# Patient Record
Sex: Male | Born: 1955
Health system: Southern US, Community
[De-identification: ages and names within clinical notes are randomized; demographics above are authoritative.]

## PROBLEM LIST (undated history)

## (undated) DIAGNOSIS — R7301 Impaired fasting glucose: Secondary | ICD-10-CM

## (undated) DIAGNOSIS — Z860100 Personal history of colon polyps, unspecified: Secondary | ICD-10-CM

## (undated) DIAGNOSIS — I10 Essential (primary) hypertension: Secondary | ICD-10-CM

## (undated) DIAGNOSIS — Z951 Presence of aortocoronary bypass graft: Secondary | ICD-10-CM

## (undated) DIAGNOSIS — I2510 Atherosclerotic heart disease of native coronary artery without angina pectoris: Secondary | ICD-10-CM

## (undated) DIAGNOSIS — S62109A Fracture of unspecified carpal bone, unspecified wrist, initial encounter for closed fracture: Secondary | ICD-10-CM

## (undated) DIAGNOSIS — K219 Gastro-esophageal reflux disease without esophagitis: Secondary | ICD-10-CM

## (undated) DIAGNOSIS — N529 Male erectile dysfunction, unspecified: Secondary | ICD-10-CM

## (undated) DIAGNOSIS — G479 Sleep disorder, unspecified: Secondary | ICD-10-CM

## (undated) DIAGNOSIS — I2102 ST elevation (STEMI) myocardial infarction involving left anterior descending coronary artery: Secondary | ICD-10-CM

## (undated) DIAGNOSIS — R7303 Prediabetes: Secondary | ICD-10-CM

## (undated) DIAGNOSIS — E785 Hyperlipidemia, unspecified: Secondary | ICD-10-CM

## (undated) DIAGNOSIS — M1990 Unspecified osteoarthritis, unspecified site: Secondary | ICD-10-CM

## (undated) HISTORY — DX: Sleep disorder, unspecified: G47.9

## (undated) HISTORY — PX: RHINOPLASTY: SUR1284

## (undated) HISTORY — DX: Impaired fasting glucose: R73.01

## (undated) HISTORY — DX: Personal history of colonic polyps: Z86.010

## (undated) HISTORY — DX: Essential (primary) hypertension: I10

## (undated) HISTORY — PX: HEMORRHOID SURGERY: SHX153

## (undated) HISTORY — PX: BRANCHIAL CLEFT CYST EXCISION: SUR497

## (undated) HISTORY — DX: Atherosclerotic heart disease of native coronary artery without angina pectoris: I25.10

## (undated) HISTORY — DX: Fracture of unspecified carpal bone, unspecified wrist, initial encounter for closed fracture: S62.109A

## (undated) HISTORY — DX: Gastro-esophageal reflux disease without esophagitis: K21.9

## (undated) HISTORY — DX: Personal history of colon polyps, unspecified: Z86.0100

## (undated) HISTORY — DX: Male erectile dysfunction, unspecified: N52.9

## (undated) HISTORY — PX: CARDIAC CATHETERIZATION: SHX172

## (undated) HISTORY — DX: Hyperlipidemia, unspecified: E78.5

---

## 1997-08-22 ENCOUNTER — Ambulatory Visit (HOSPITAL_BASED_OUTPATIENT_CLINIC_OR_DEPARTMENT_OTHER): Admission: RE | Admit: 1997-08-22 | Discharge: 1997-08-22 | Payer: Self-pay | Admitting: *Deleted

## 1998-08-29 ENCOUNTER — Encounter: Payer: Self-pay | Admitting: Internal Medicine

## 1999-05-07 ENCOUNTER — Emergency Department (HOSPITAL_COMMUNITY): Admission: EM | Admit: 1999-05-07 | Discharge: 1999-05-07 | Payer: Self-pay | Admitting: Emergency Medicine

## 1999-05-07 ENCOUNTER — Encounter: Payer: Self-pay | Admitting: Emergency Medicine

## 2005-10-27 DIAGNOSIS — I2510 Atherosclerotic heart disease of native coronary artery without angina pectoris: Secondary | ICD-10-CM

## 2005-10-27 HISTORY — DX: Atherosclerotic heart disease of native coronary artery without angina pectoris: I25.10

## 2005-10-29 ENCOUNTER — Encounter: Payer: Self-pay | Admitting: Family Medicine

## 2005-10-29 ENCOUNTER — Inpatient Hospital Stay (HOSPITAL_COMMUNITY): Admission: EM | Admit: 2005-10-29 | Discharge: 2005-10-31 | Payer: Self-pay | Admitting: Emergency Medicine

## 2005-11-20 ENCOUNTER — Encounter: Payer: Self-pay | Admitting: Internal Medicine

## 2006-11-25 ENCOUNTER — Ambulatory Visit: Payer: Self-pay | Admitting: Internal Medicine

## 2006-11-25 DIAGNOSIS — G479 Sleep disorder, unspecified: Secondary | ICD-10-CM | POA: Insufficient documentation

## 2006-11-25 DIAGNOSIS — K219 Gastro-esophageal reflux disease without esophagitis: Secondary | ICD-10-CM | POA: Insufficient documentation

## 2006-11-25 DIAGNOSIS — E785 Hyperlipidemia, unspecified: Secondary | ICD-10-CM | POA: Insufficient documentation

## 2006-11-25 DIAGNOSIS — N529 Male erectile dysfunction, unspecified: Secondary | ICD-10-CM | POA: Insufficient documentation

## 2006-12-17 ENCOUNTER — Telehealth (INDEPENDENT_AMBULATORY_CARE_PROVIDER_SITE_OTHER): Payer: Self-pay | Admitting: *Deleted

## 2007-01-06 ENCOUNTER — Encounter: Payer: Self-pay | Admitting: Internal Medicine

## 2007-02-04 ENCOUNTER — Encounter: Payer: Self-pay | Admitting: Internal Medicine

## 2007-03-05 ENCOUNTER — Ambulatory Visit: Payer: Self-pay | Admitting: Internal Medicine

## 2007-03-06 LAB — CONVERTED CEMR LAB
ALT: 30 units/L (ref 0–53)
Albumin: 3.9 g/dL (ref 3.5–5.2)
BUN: 10 mg/dL (ref 6–23)
Basophils Absolute: 0.1 10*3/uL (ref 0.0–0.1)
Basophils Relative: 1.3 % — ABNORMAL HIGH (ref 0.0–1.0)
CO2: 30 meq/L (ref 19–32)
Calcium: 9.5 mg/dL (ref 8.4–10.5)
Chloride: 105 meq/L (ref 96–112)
Cholesterol: 235 mg/dL (ref 0–200)
Creatinine, Ser: 1 mg/dL (ref 0.4–1.5)
Direct LDL: 168.3 mg/dL
Eosinophils Absolute: 0.6 10*3/uL (ref 0.0–0.6)
Eosinophils Relative: 6.9 % — ABNORMAL HIGH (ref 0.0–5.0)
GFR calc Af Amer: 101 mL/min
GFR calc non Af Amer: 84 mL/min
Glucose, Bld: 108 mg/dL — ABNORMAL HIGH (ref 70–99)
HCT: 43.2 % (ref 39.0–52.0)
HDL: 41.6 mg/dL (ref >39.0)
Hemoglobin: 15 g/dL (ref 13.0–17.0)
Lymphocytes Relative: 40.5 % (ref 12.0–46.0)
MCHC: 34.8 g/dL (ref 30.0–36.0)
MCV: 89.3 fL (ref 78.0–100.0)
Monocytes Absolute: 0.9 10*3/uL — ABNORMAL HIGH (ref 0.2–0.7)
Monocytes Relative: 10.8 % (ref 3.0–11.0)
Neutro Abs: 3.2 10*3/uL (ref 1.4–7.7)
Neutrophils Relative %: 40.5 % — ABNORMAL LOW (ref 43.0–77.0)
PSA: 0.6 ng/mL (ref 0.10–4.00)
Phosphorus: 2.8 mg/dL (ref 2.3–4.6)
Platelets: 260 10*3/uL (ref 150–400)
Potassium: 4.4 meq/L (ref 3.5–5.1)
RBC: 4.84 M/uL (ref 4.22–5.81)
RDW: 12.6 % (ref 11.5–14.6)
Sodium: 141 meq/L (ref 135–145)
TSH: 1.07 microintl units/mL (ref 0.35–5.50)
Total CHOL/HDL Ratio: 5.6
Triglycerides: 111 mg/dL (ref 0–149)
VLDL: 22 mg/dL (ref 0–40)
WBC: 8 10*3/uL (ref 4.5–10.5)

## 2007-04-01 ENCOUNTER — Ambulatory Visit: Payer: Self-pay | Admitting: Gastroenterology

## 2007-04-03 ENCOUNTER — Ambulatory Visit: Payer: Self-pay | Admitting: Gastroenterology

## 2007-04-03 ENCOUNTER — Encounter: Payer: Self-pay | Admitting: Internal Medicine

## 2007-04-03 ENCOUNTER — Encounter: Payer: Self-pay | Admitting: Gastroenterology

## 2007-04-03 DIAGNOSIS — D126 Benign neoplasm of colon, unspecified: Secondary | ICD-10-CM | POA: Insufficient documentation

## 2007-04-03 HISTORY — PX: COLONOSCOPY: SHX174

## 2007-04-03 LAB — HM COLONOSCOPY

## 2007-04-06 ENCOUNTER — Encounter: Payer: Self-pay | Admitting: Internal Medicine

## 2007-05-16 DIAGNOSIS — K5730 Diverticulosis of large intestine without perforation or abscess without bleeding: Secondary | ICD-10-CM | POA: Insufficient documentation

## 2007-05-16 DIAGNOSIS — I10 Essential (primary) hypertension: Secondary | ICD-10-CM | POA: Insufficient documentation

## 2007-07-02 ENCOUNTER — Emergency Department (HOSPITAL_COMMUNITY): Admission: EM | Admit: 2007-07-02 | Discharge: 2007-07-02 | Payer: Self-pay | Admitting: Family Medicine

## 2007-09-07 ENCOUNTER — Ambulatory Visit: Payer: Self-pay | Admitting: Internal Medicine

## 2008-02-29 ENCOUNTER — Ambulatory Visit: Payer: Self-pay | Admitting: Family Medicine

## 2008-03-15 ENCOUNTER — Ambulatory Visit: Payer: Self-pay | Admitting: Internal Medicine

## 2008-03-16 LAB — CONVERTED CEMR LAB
ALT: 31 units/L (ref 0–53)
AST: 26 units/L (ref 0–37)
Albumin: 3.8 g/dL (ref 3.5–5.2)
Alkaline Phosphatase: 93 units/L (ref 39–117)
BUN: 11 mg/dL (ref 6–23)
Basophils Absolute: 0.1 10*3/uL (ref 0.0–0.1)
Basophils Relative: 0.9 % (ref 0.0–3.0)
Bilirubin, Direct: 0.1 mg/dL (ref 0.0–0.3)
CO2: 30 meq/L (ref 19–32)
Calcium: 9.3 mg/dL (ref 8.4–10.5)
Chloride: 105 meq/L (ref 96–112)
Cholesterol: 221 mg/dL (ref 0–200)
Creatinine, Ser: 1 mg/dL (ref 0.4–1.5)
Direct LDL: 135.3 mg/dL
Eosinophils Absolute: 0.5 10*3/uL (ref 0.0–0.7)
Eosinophils Relative: 4.8 % (ref 0.0–5.0)
GFR calc Af Amer: 101 mL/min
GFR calc non Af Amer: 83 mL/min
Glucose, Bld: 115 mg/dL — ABNORMAL HIGH (ref 70–99)
HCT: 43.6 % (ref 39.0–52.0)
HDL: 47.8 mg/dL (ref >39.0)
Hemoglobin: 15.1 g/dL (ref 13.0–17.0)
Lymphocytes Relative: 22.9 % (ref 12.0–46.0)
MCHC: 34.7 g/dL (ref 30.0–36.0)
MCV: 89.6 fL (ref 78.0–100.0)
Monocytes Absolute: 0.9 10*3/uL (ref 0.1–1.0)
Monocytes Relative: 8 % (ref 3.0–12.0)
Neutro Abs: 6.7 10*3/uL (ref 1.4–7.7)
Neutrophils Relative %: 63.4 % (ref 43.0–77.0)
PSA: 0.67 ng/mL (ref 0.10–4.00)
Phosphorus: 2.7 mg/dL (ref 2.3–4.6)
Platelets: 272 10*3/uL (ref 150–400)
Potassium: 4.3 meq/L (ref 3.5–5.1)
RBC: 4.87 M/uL (ref 4.22–5.81)
RDW: 13 % (ref 11.5–14.6)
Sodium: 142 meq/L (ref 135–145)
TSH: 0.65 microintl units/mL (ref 0.35–5.50)
Total Bilirubin: 0.8 mg/dL (ref 0.3–1.2)
Total CHOL/HDL Ratio: 4.6
Total Protein: 7.2 g/dL (ref 6.0–8.3)
Triglycerides: 112 mg/dL (ref 0–149)
VLDL: 22 mg/dL (ref 0–40)
WBC: 10.7 10*3/uL — ABNORMAL HIGH (ref 4.5–10.5)

## 2008-03-29 ENCOUNTER — Telehealth: Payer: Self-pay | Admitting: Internal Medicine

## 2008-03-31 ENCOUNTER — Encounter: Payer: Self-pay | Admitting: Internal Medicine

## 2008-05-14 ENCOUNTER — Ambulatory Visit: Payer: Self-pay | Admitting: *Deleted

## 2008-05-14 LAB — CONVERTED CEMR LAB: Rapid Strep: NEGATIVE

## 2008-06-09 ENCOUNTER — Encounter (INDEPENDENT_AMBULATORY_CARE_PROVIDER_SITE_OTHER): Payer: Self-pay | Admitting: *Deleted

## 2008-06-11 ENCOUNTER — Ambulatory Visit: Payer: Self-pay | Admitting: Family Medicine

## 2008-08-18 ENCOUNTER — Ambulatory Visit: Payer: Self-pay | Admitting: Family Medicine

## 2008-08-22 ENCOUNTER — Ambulatory Visit: Payer: Self-pay | Admitting: Family Medicine

## 2008-08-22 ENCOUNTER — Telehealth: Payer: Self-pay | Admitting: Family Medicine

## 2008-08-23 ENCOUNTER — Encounter: Payer: Self-pay | Admitting: Family Medicine

## 2008-08-26 ENCOUNTER — Ambulatory Visit: Payer: Self-pay | Admitting: Family Medicine

## 2008-12-02 ENCOUNTER — Ambulatory Visit: Payer: Self-pay | Admitting: Internal Medicine

## 2008-12-02 DIAGNOSIS — R7303 Prediabetes: Secondary | ICD-10-CM | POA: Insufficient documentation

## 2008-12-02 LAB — CONVERTED CEMR LAB
ALT: 38 units/L (ref 0–53)
AST: 28 units/L (ref 0–37)
Albumin: 4 g/dL (ref 3.5–5.2)
Alkaline Phosphatase: 96 units/L (ref 39–117)
BUN: 17 mg/dL (ref 6–23)
Basophils Absolute: 0 10*3/uL (ref 0.0–0.1)
Basophils Relative: 0.6 % (ref 0.0–3.0)
Bilirubin, Direct: 0.2 mg/dL (ref 0.0–0.3)
CO2: 32 meq/L (ref 19–32)
Calcium: 9.3 mg/dL (ref 8.4–10.5)
Chloride: 106 meq/L (ref 96–112)
Cholesterol: 232 mg/dL — ABNORMAL HIGH (ref 0–200)
Creatinine, Ser: 1 mg/dL (ref 0.4–1.5)
Direct LDL: 164.4 mg/dL
Eosinophils Absolute: 0.7 10*3/uL (ref 0.0–0.7)
Eosinophils Relative: 9.7 % — ABNORMAL HIGH (ref 0.0–5.0)
Glucose, Bld: 118 mg/dL — ABNORMAL HIGH (ref 70–99)
HCT: 44.3 % (ref 39.0–52.0)
HDL: 44.4 mg/dL (ref >39.00)
Hemoglobin: 15.4 g/dL (ref 13.0–17.0)
Hgb A1c MFr Bld: 5.9 % (ref 4.6–6.5)
Lymphocytes Relative: 38.8 % (ref 12.0–46.0)
Lymphs Abs: 2.9 10*3/uL (ref 0.7–4.0)
MCHC: 34.8 g/dL (ref 30.0–36.0)
MCV: 89.7 fL (ref 78.0–100.0)
Monocytes Absolute: 0.8 10*3/uL (ref 0.1–1.0)
Monocytes Relative: 10.5 % (ref 3.0–12.0)
Neutro Abs: 3.1 10*3/uL (ref 1.4–7.7)
Neutrophils Relative %: 40.4 % — ABNORMAL LOW (ref 43.0–77.0)
Phosphorus: 2.9 mg/dL (ref 2.3–4.6)
Platelets: 235 10*3/uL (ref 150.0–400.0)
Potassium: 4.2 meq/L (ref 3.5–5.1)
RBC: 4.94 M/uL (ref 4.22–5.81)
RDW: 13.1 % (ref 11.5–14.6)
Sodium: 143 meq/L (ref 135–145)
TSH: 0.82 microintl units/mL (ref 0.35–5.50)
Total Bilirubin: 0.9 mg/dL (ref 0.3–1.2)
Total CHOL/HDL Ratio: 5
Total Protein: 7.3 g/dL (ref 6.0–8.3)
Triglycerides: 157 mg/dL — ABNORMAL HIGH (ref 0.0–149.0)
VLDL: 31.4 mg/dL (ref 0.0–40.0)
WBC: 7.5 10*3/uL (ref 4.5–10.5)

## 2009-02-04 ENCOUNTER — Encounter: Payer: Self-pay | Admitting: Family Medicine

## 2009-02-04 ENCOUNTER — Ambulatory Visit: Payer: Self-pay | Admitting: Family Medicine

## 2009-05-01 ENCOUNTER — Telehealth: Payer: Self-pay | Admitting: Family Medicine

## 2009-06-09 ENCOUNTER — Ambulatory Visit: Payer: Self-pay | Admitting: Internal Medicine

## 2009-12-15 ENCOUNTER — Ambulatory Visit: Payer: Self-pay | Admitting: Internal Medicine

## 2009-12-19 LAB — CONVERTED CEMR LAB
ALT: 22 units/L (ref 0–53)
AST: 19 units/L (ref 0–37)
Albumin: 4.1 g/dL (ref 3.5–5.2)
Alkaline Phosphatase: 90 units/L (ref 39–117)
Anti Nuclear Antibody(ANA): NEGATIVE
BUN: 12 mg/dL (ref 6–23)
CO2: 24 meq/L (ref 19–32)
Calcium: 9.4 mg/dL (ref 8.4–10.5)
Chloride: 104 meq/L (ref 96–112)
Creatinine, Ser: 1.07 mg/dL (ref 0.40–1.50)
Glucose, Bld: 127 mg/dL — ABNORMAL HIGH (ref 70–99)
HCT: 43.8 % (ref 39.0–52.0)
Hemoglobin: 14.3 g/dL (ref 13.0–17.0)
MCHC: 32.6 g/dL (ref 30.0–36.0)
MCV: 89.9 fL (ref 78.0–100.0)
Platelets: 234 10*3/uL (ref 150–400)
Potassium: 4.3 meq/L (ref 3.5–5.3)
RBC: 4.87 M/uL (ref 4.22–5.81)
RDW: 13.7 % (ref 11.5–15.5)
Rhuematoid fact SerPl-aCnc: <20 intl units/mL (ref 0–20)
Sed Rate: 5 mm/hr (ref 0–16)
Sex Hormone Binding: 15 nmol/L (ref 13–71)
Sodium: 139 meq/L (ref 135–145)
TSH: 1.066 microintl units/mL (ref 0.350–4.500)
Testosterone Free: 55.4 pg/mL (ref 47.0–244.0)
Testosterone-% Free: 2.8 % (ref 1.6–2.9)
Testosterone: 199.59 ng/dL — ABNORMAL LOW (ref 350–890)
Total Bilirubin: 0.5 mg/dL (ref 0.3–1.2)
Total Protein: 6.6 g/dL (ref 6.0–8.3)
WBC: 9.4 10*3/uL (ref 4.0–10.5)

## 2010-01-26 ENCOUNTER — Ambulatory Visit: Payer: Self-pay | Admitting: Internal Medicine

## 2010-01-26 ENCOUNTER — Encounter: Payer: Self-pay | Admitting: Family Medicine

## 2010-01-26 ENCOUNTER — Ambulatory Visit (HOSPITAL_COMMUNITY)
Admission: RE | Admit: 2010-01-26 | Discharge: 2010-01-26 | Payer: Self-pay | Source: Home / Self Care | Admitting: Internal Medicine

## 2010-01-30 ENCOUNTER — Ambulatory Visit: Payer: Self-pay | Admitting: Internal Medicine

## 2010-01-30 ENCOUNTER — Encounter: Payer: Self-pay | Admitting: Family Medicine

## 2010-02-02 ENCOUNTER — Telehealth: Payer: Self-pay | Admitting: Family Medicine

## 2010-02-05 ENCOUNTER — Ambulatory Visit: Payer: Self-pay | Admitting: Internal Medicine

## 2010-02-05 ENCOUNTER — Encounter: Payer: Self-pay | Admitting: Family Medicine

## 2010-02-15 ENCOUNTER — Telehealth: Payer: Self-pay | Admitting: Family Medicine

## 2010-04-19 ENCOUNTER — Telehealth: Payer: Self-pay | Admitting: Internal Medicine

## 2010-05-23 ENCOUNTER — Telehealth: Payer: Self-pay | Admitting: Internal Medicine

## 2010-05-29 NOTE — Assessment & Plan Note (Signed)
Summary: sinus congestion and cough   Vital Signs:  Patient profile:   55 year old male Weight:      239 pounds Temp:     97.8 degrees F oral Pulse rate:   62 / minute BP sitting:   110 / 70  (left arm)  Vitals Entered By: Doristine Devoid (February 04, 2009 9:03 AM) CC: sinus congestion and cough   Primary Care Provider:  Cindee Salt MD  CC:  sinus congestion and cough.  History of Present Illness: 55 y/o male w 3 days hx of head cong.,, h/o of ar..nonsmoker..takes clartin once daily and sns as needed..cats at home too  Allergies: 1)  Lipitor 2)  Simvastatin (Simvastatin)  Past History:  Past medical, surgical, family and social histories (including risk factors) reviewed for relevance to current acute and chronic problems.  Past Medical History: Reviewed history from 12/02/2008 and no changes required. Coronary artery disease------------------------------------Dr Katrinka Blazing Hypertension Erectile dysfunction Chronic sleep disorder GERD Hyperlipidemia Colonic polyps, hx of--hyperplastic Impaired fasting glucose  Past Surgical History: Reviewed history from 11/25/2006 and no changes required. 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 Dad died @69  ??cause Mom died @81  CVA and ?MI 2 brothers  4 sisters 1 brother died of heart trouble  No other CAD, HTN, DM Some cancer--both maternal uncles died of some cancer Not aware of colon or prostate cancer  Social History: Reviewed history from 11/25/2006 and no changes required. Occupation: Counsellor signs (like for businesses) Married-2nd     2 children from 1st marriage, 2 children with current wife Former Smoker--quit 7/07 Alcohol use-rare  Review of Systems      See HPI  Physical Exam  General:  Well-developed,well-nourished,in no acute distress; alert,appropriate and  cooperative throughout examination Head:  Normocephalic and atraumatic without obvious abnormalities. No apparent alopecia or balding. Eyes:  No corneal or conjunctival inflammation noted. EOMI. Perrla. Funduscopic exam benign, without hemorrhages, exudates or papilledema. Vision grossly normal. Ears:  External ear exam shows no significant lesions or deformities.  Otoscopic examination reveals clear canals, tympanic membranes are intact bilaterally without bulging, retraction, inflammation or discharge. Hearing is grossly normal bilaterally. Nose:  septum in midline..3plus nasal edema Mouth:  Oral mucosa and oropharynx without lesions or exudates.  Teeth in good repair. Neck:  No deformities, masses, or tenderness noted. Lungs:  Normal respiratory effort, chest expands symmetrically. Lungs are clear to auscultation, no crackles or wheezes.   Impression & Recommendations:  Problem # 1:  ALLERGIC RHINITIS, DUE TO ANIMAL HAIR OR DANDER (ICD-477.2) Assessment New  Orders: Prescription Created Electronically [phone])  Complete Medication List: 1)  Metoprolol Tartrate 50 Mg Tabs (Metoprolol tartrate) .... Take 1 tablet by mouth once a day 2)  Adult Aspirin Low Strength 81 Mg Tbdp (Aspirin) .... Take 1 tablet by mouth once a day 3)  Multivitamins Tabs (Multiple vitamin) .... Take 1 tablet by mouth once a day 4)  Trazodone Hcl 100 Mg Tabs (Trazodone hcl) .Marland Kitchen.. 1-2 at bedtime to help sleep 5)  Prilosec 20 Mg Cpdr (Omeprazole) .... Take 1 tablet by mouth once a day as needed for heartburn 6)  Fluticasone Propionate 50 Mcg/act Susp (Fluticasone propionate) .... 2 sprays per nostril daily 7)  Prednisone 20 Mg Tabs (Prednisone) .... Uad  Patient Instructions: 1)  pred. 20 mg    2 x 3days, 1 x 3 days, 1/2 x 3 days, then 1/2 mon.,  wed., frid.  x 2 weeks. Prescriptions: PREDNISONE 20 MG TABS (PREDNISONE) UAD  #30 x 0   Entered and Authorized by:   Roderick Pee MD   Signed by:   Roderick Pee MD on  02/04/2009   Method used:   Electronically to        [facility]* (retail)       [id number]       [place], [place]  [address]       Ph: [phone]       Fax: [phone]   RxID:   [id number]

## 2010-05-29 NOTE — Assessment & Plan Note (Signed)
Summary: headache from fall/alc   Vital Signs:  Patient profile:   55 year old male Weight:      234.50 pounds Temp:     98.3 degrees F oral Pulse rate:   76 / minute Pulse (ortho):   78 / minute Pulse rhythm:   regular BP sitting:   130 / 80  (left arm) BP standing:   142 / 76 Cuff size:   large  Vitals Entered By: Selena Batten Dance CMA Duncan Dull) (January 26, 2010 4:32 PM)  Serial Vital Signs/Assessments:  Time      Position  BP       Pulse  Resp  Temp     By 5:05 PM   Lying LA  142/80   76                    Kim Dance CMA (AAMA) 5:05 PM   Sitting   142/76   78                    Kim Dance CMA (AAMA) 5:05 PM   Standing  142/76   78                    Kim Dance CMA (AAMA)  CC: Headache/dizziness from fall   History of Present Illness: CC: headache after fall  DOI: 01/24/2010  55 yo presents with HA/dizziness after 6 foot fall off bucket truck.  Did have LOC a few minutes, hit head, had hard hat on.  Went to [place] ER, normal head CT, broke L wrist.  Sent home with percocets.  Has taken 1 percocet this am, also taking ibuprofen 2 Q4 hours.  HA posterior and travels to front.  + dizzy with laying down and with standing.  Dizzy = not passing out pr presyncope, + lightheaded, + vertigo.  HA staying about the same.  Dizziness worsening.  + nausea.  No abd pain, v, no further LOC.  No seizures, no vision changes.  No AMS, no confusion.  Taking it easy at home.   L arm fractured.  Has apt with Dr Elenor Quinones ortho Monday.  Has appt with spine ortho Tuesday  Allergies: 1)  Lipitor 2)  Simvastatin (Simvastatin)  Past History:  Past Medical History: Last updated: 12/02/2008 Coronary artery disease------------------------------------Dr Katrinka Blazing Hypertension Erectile dysfunction Chronic sleep disorder GERD Hyperlipidemia Colonic polyps, hx of--hyperplastic Impaired fasting glucose  Social History: Last updated: 11/25/2006 Occupation: Installs signs (like for businesses) Married-2nd       2 children from 1st marriage, 2 children with current wife Former Smoker--quit 7/07 Alcohol use-rare  Review of Systems       per HPI  Physical Exam  General:  laying flat on exam table and stays like this throughout history taking Head:  scalp abrasion x 2 - R occipital and R parietooccipital regions Eyes:  PERRLA, decreased fullness of L EOM especially lateral upward gaze. Ears:  L TM normal, scarring, R TM covered with cerumen.  no external drainage  Nose:  septum in midline..3plus nasal edema Mouth:  Oral mucosa and oropharynx without lesions or exudates.  Teeth in good repair. Neck:  supple, no masses, no thyromegaly, no carotid bruits, and no cervical lymphadenopathy.   Chest Wall:  sternum tender to palpation  Lungs:  normal respiratory effort, no intercostal retractions, no accessory muscle use, and normal breath sounds.   Heart:  normal rate, regular rhythm, no murmur, and no gallop.   Abdomen:  Bowel  sounds positive,abdomen soft and non-tender without masses, organomegaly or hernias noted. Msk:  No deformity or scoliosis noted of thoracic or lumbar spine.   Pulses:  2+ rad pulses Extremities:  no edema Neurologic:  CN 2-12 intact except mild decreased EOM on left but not necessarily to qualify for palsy.  Sensation intact, strength intact, normal finger to nose.    + blatant nystagmus with vertigo with dix hallpike bilaterally, and with moving to sitting from supine position.  gait unsteady   Impression & Recommendations:  Problem # 1:  DIZZINESS (ICD-780.4) s/p traumatic fall onto head.  given worsening vertigo with nystagmus will rpt head CT to r/o subdural or other bleed.  If normal, somewhat reassuring and RTC next week.  However, also concern for cerebellar pathology.  red flags to seek urgent care discussed.  Will set up with urgent head CT scan, call report and call patient for results.  Orders: Radiology Referral (Radiology)  Problem # 2:  HEAD TRAUMA  (ICD-959.01) see above  Complete Medication List: 1)  Prilosec 20 Mg Cpdr (Omeprazole) .... Take 1 tablet by mouth once a day as needed for heartburn 2)  Metoprolol Tartrate 50 Mg Tabs (Metoprolol tartrate) .... Take 1 tablet by mouth once a day 3)  Trazodone Hcl 100 Mg Tabs (Trazodone hcl) .Marland Kitchen.. 1-2 at bedtime to help sleep 4)  Fluticasone Propionate 50 Mcg/act Susp (Fluticasone propionate) .... 2 sprays per nostril daily 5)  Adult Aspirin Low Strength 81 Mg Tbdp (Aspirin) .... Take 1 tablet by mouth once a day 6)  Multivitamins Tabs (Multiple vitamin) .... Take 1 tablet by mouth once a day 7)  Viagra 100 Mg Tabs (Sildenafil citrate) .... 1/2 - 1 tab about 30-60 minutes before sex 8)  Oxycodone-acetaminophen 5-325 Mg Tabs (Oxycodone-acetaminophen) .Marland Kitchen.. 1-2 by mouth every 6 hours as needed severe pain  Patient Instructions: 1)  Return early next week for follow up. 2)  Head CT for continued vertigo 2 days after fall. 3)  I will call you with results to [phone] 4)  If normal, we will see you next week.  Meanwhile watch out for worsening headache, confusion or altered mental status, vomiting or worsening dizziness, or other worsening.  If this happens you may need to go to ER for repeat evaluation. 5)  Call clinic with quesitons.  Good to meet you today.  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

## 2010-05-29 NOTE — Assessment & Plan Note (Signed)
Summary: F/U PER DR TOWER/CLE   Vital Signs:  Patient profile:   55 year old male Height:      70.5 inches Weight:      221 pounds BMI:     31.38 Temp:     97.6 degrees F oral Pulse rate:   88 / minute Pulse rhythm:   regular BP sitting:   110 / 70  (left arm) Cuff size:   large  Vitals Entered By: Liane Comber (August 26, 2008 3:56 PM)  History of Present Illness: last visit added augmentin to bactrim for coverage of facial wound/cellulitis  wound cx shows no growth for 2 days   spot is overall feeling better  little to no drainage  no pain  no more fever or malaise   is getting stomach upset on abx -- little diarrha     Allergies: 1)  Lipitor 2)  Simvastatin (Simvastatin)  Past History:  Past Medical History:    Coronary artery disease------------------------------------Dr Katrinka Blazing    Hypertension    Erectile dysfunction    Chronic sleep disorder    GERD    Hyperlipidemia    Colonic polyps, hx of--hyperplastic     (09/07/2007)  Past Surgical History:    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    1970's--Hemorrhoidectomy    1980's--Septo/rhinoplasty    7/07  Cath/ unstable angina (11/25/2006)  Review of Systems General:  Denies chills, fatigue, fever, loss of appetite, and malaise. Eyes:  Denies blurring, discharge, and eye pain. GI:  Complains of diarrhea; loose stool from antibiotic- is mild and tolerable no abd pain. Derm:  Denies itching.  Physical Exam  General:  Well-developed,well-nourished,in no acute distress; alert,appropriate and cooperative throughout examination Head:  area of induration/erythema on R cheek is smaller and dry/ some peeling , no discharge Eyes:  vision grossly intact, pupils equal, pupils round, and pupils reactive to light.   Mouth:  pharynx pink and moist.   Lungs:  CTA Heart:  RRR   Impression & Recommendations:  Problem # 1:  CELLULITIS, FACE, RIGHT (ICD-682.0) Assessment Improved much improved with addn of augmentin wound  culture neg  adv to finish abx- use warm compress as needed  keep clean / cover if needed  adv to call or seek care if worse/swelling /redness or any fever  His updated medication list for this problem includes:    Bactrim Ds 800-160 Mg Tabs (Sulfamethoxazole-trimethoprim) .Marland Kitchen... 1 by mouth two times a day for 10 days    Augmentin 875-125 Mg Tabs (Amoxicillin-pot clavulanate) .Marland Kitchen... 1 by mouth two times a day for 10 days  Complete Medication List: 1)  Metoprolol Tartrate 50 Mg Tabs (Metoprolol tartrate) .... Take 1 tablet by mouth once a day 2)  Adult Aspirin Low Strength 81 Mg Tbdp (Aspirin) .... Take 1 tablet by mouth once a day 3)  Multivitamins Tabs (Multiple vitamin) .... Take 1 tablet by mouth once a day 4)  Equate Sleep Aid  .... One by mouth at bedtime as needed 5)  Ketoconazole 2 % Crea (Ketoconazole) .... Apply to rash two times a day till clear 6)  Mycostatin 100000 Unit/gm Powd (Nystatin) .... Apply to groin rash three times a day till clear 7)  Trazodone Hcl 100 Mg Tabs (Trazodone hcl) .Marland Kitchen.. 1-2 at bedtime to help sleep 8)  Prilosec 20 Mg Cpdr (Omeprazole) .... Take 1 tablet by mouth once a day as needed for heartburn 9)  Fluticasone Propionate 50 Mcg/act Susp (Fluticasone propionate) .... 2 sprays per nostril daily  10)  Bactrim Ds 800-160 Mg Tabs (Sulfamethoxazole-trimethoprim) .Marland Kitchen.. 1 by mouth two times a day for 10 days 11)  Bactroban 2 % Oint (Mupirocin) .... Apply to affected area two times a day 12)  Augmentin 875-125 Mg Tabs (Amoxicillin-pot clavulanate) .Marland Kitchen.. 1 by mouth two times a day for 10 days  Patient Instructions: 1)  keep spot on place very clean  2)  finish your antibiotics as directed - and you can use a warm compress on it  3)  please call or seek care if increase in redness or swelling or pain - or if not further improved next week     Prior Medications (reviewed today): METOPROLOL TARTRATE 50 MG  TABS (METOPROLOL TARTRATE) Take 1 tablet by mouth once a  day ADULT ASPIRIN LOW STRENGTH 81 MG  TBDP (ASPIRIN) Take 1 tablet by mouth once a day MULTIVITAMINS   TABS (MULTIPLE VITAMIN) Take 1 tablet by mouth once a day EQUATE SLEEP AID () One by mouth at bedtime as needed KETOCONAZOLE 2 %  CREA (KETOCONAZOLE) apply to rash two times a day till clear MYCOSTATIN 100000 UNIT/GM  POWD (NYSTATIN) apply to groin rash three times a day till clear TRAZODONE HCL 100 MG  TABS (TRAZODONE HCL) 1-2 at bedtime to help sleep PRILOSEC 20 MG  CPDR (OMEPRAZOLE) Take 1 tablet by mouth once a day as needed for heartburn FLUTICASONE PROPIONATE 50 MCG/ACT SUSP (FLUTICASONE PROPIONATE) 2 sprays per nostril daily BACTRIM DS 800-160 MG TABS (SULFAMETHOXAZOLE-TRIMETHOPRIM) 1 by mouth two times a day for 10 days BACTROBAN 2 % OINT (MUPIROCIN) apply to affected area two times a day AUGMENTIN 875-125 MG TABS (AMOXICILLIN-POT CLAVULANATE) 1 by mouth two times a day for 10 days Current Allergies (reviewed today): LIPITOR SIMVASTATIN (SIMVASTATIN) Current Medications (including changes made in today's visit):  METOPROLOL TARTRATE 50 MG  TABS (METOPROLOL TARTRATE) Take 1 tablet by mouth once a day ADULT ASPIRIN LOW STRENGTH 81 MG  TBDP (ASPIRIN) Take 1 tablet by mouth once a day MULTIVITAMINS   TABS (MULTIPLE VITAMIN) Take 1 tablet by mouth once a day * EQUATE SLEEP AID One by mouth at bedtime as needed KETOCONAZOLE 2 %  CREA (KETOCONAZOLE) apply to rash two times a day till clear MYCOSTATIN 100000 UNIT/GM  POWD (NYSTATIN) apply to groin rash three times a day till clear TRAZODONE HCL 100 MG  TABS (TRAZODONE HCL) 1-2 at bedtime to help sleep PRILOSEC 20 MG  CPDR (OMEPRAZOLE) Take 1 tablet by mouth once a day as needed for heartburn FLUTICASONE PROPIONATE 50 MCG/ACT SUSP (FLUTICASONE PROPIONATE) 2 sprays per nostril daily BACTRIM DS 800-160 MG TABS (SULFAMETHOXAZOLE-TRIMETHOPRIM) 1 by mouth two times a day for 10 days BACTROBAN 2 % OINT (MUPIROCIN) apply to affected area two times  a day AUGMENTIN 875-125 MG TABS (AMOXICILLIN-POT CLAVULANATE) 1 by mouth two times a day for 10 days

## 2010-05-29 NOTE — Assessment & Plan Note (Signed)
Summary: cpx/bir   Vital Signs:  Patient profile:   55 year old male Weight:      227 pounds Temp:     98.1 degrees F oral Pulse rate:   60 / minute Pulse rhythm:   regular BP sitting:   110 / 70  (left arm) Cuff size:   large  Vitals Entered By: Mervin Hack CMA (December 02, 2008 8:00 AM)  History of Present Illness: Chief Complaint: adult physical  Has itchy eye on left feels "like a hair is in it" No drainage No redness  had equivocal stress test last time No sig symptoms since then gets occ pulling sensation--mostly at rest at night Physically active at work and doesn't generally have symptoms then  Allergies: 1)  Lipitor 2)  Simvastatin (Simvastatin)  Past History:  Past medical, surgical, family and social histories (including risk factors) reviewed for relevance to current acute and chronic problems.  Past Medical History: Coronary artery disease------------------------------------Dr Katrinka Blazing Hypertension Erectile dysfunction Chronic sleep disorder GERD Hyperlipidemia Colonic polyps, hx of--hyperplastic Impaired fasting glucose  Past Surgical History: Reviewed history from 11/25/2006 and no changes required. 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 Dad died @69  ??cause Mom died @81  CVA and ?MI 2 brothers  4 sisters 1 brother died of heart trouble  No other CAD, HTN, DM Some cancer--both maternal uncles died of some cancer Not aware of colon or prostate cancer  Social History: Reviewed history from 11/25/2006 and no changes required. 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 Eyes:  Denies double vision and vision loss-1 eye. ENT:  Complains of decreased hearing; denies ringing in ears; stable hearing loss teeth okay--regular with dentist. CV:  Complains of chest pain or discomfort; denies difficulty breathing at night, difficulty breathing while lying down, fainting, lightheadness, palpitations, and shortness of breath with exertion. Resp:  Denies cough and shortness of breath. GI:  Complains of indigestion; denies abdominal pain, bloody stools, change in bowel habits, dark tarry stools, nausea, and vomiting; uses heartburn meds as needed only ( ~once every 2 weeks). GU:  Complains of erectile dysfunction; denies urinary frequency and urinary hesitancy; does use meds occ. MS:  Denies joint pain and joint swelling. Derm:  Complains of rash; denies lesion(s); gets intermittent little bumps--irritatted then resolve. Neuro:  Denies headaches, numbness, tingling, and weakness. Psych:  Denies anxiety and depression. Endo:  Denies excessive thirst and excessive urination. Heme:  Denies abnormal bruising and enlarge lymph nodes. Allergy:  Complains of seasonal allergies and sneezing; regular with allergy meds.  Physical Exam  General:  alert and normal appearance.   Eyes:  pupils equal, pupils round, and pupils reactive to light.  No ingrown hairs or inflammation seen Ears:  R ear normal and L ear normal.   Cerumen bilaterally Mouth:  no erythema and no lesions.   Neck:  supple, no masses, no thyromegaly, no carotid bruits, and no cervical lymphadenopathy.   Lungs:  normal respiratory effort and normal breath sounds.   Heart:  normal rate, regular rhythm, no murmur, and no gallop.   Abdomen:  soft, non-tender, and no masses.   Rectal:  no hemorrhoids and no masses.  Prostate:  no gland enlargement and no nodules.   Msk:  no joint tenderness and no joint swelling.   Pulses:  1+ in feet Extremities:  no edema Neurologic:  alert & oriented X3, strength  normal in all extremities, and gait normal.   Skin:  no rashes and no suspicious lesions.   Axillary Nodes:  No palpable lymphadenopathy Psych:  normally interactive, good eye contact, not anxious appearing, and not depressed appearing.     Impression & Recommendations:  Problem # 1:  PREVENTIVE HEALTH CARE (ICD-V70.0) Assessment Comment Only discussed fitness and weight up to date with PSA and colon  Problem # 2:  HYPERTENSION (ICD-401.9) Assessment: Unchanged  good control metoprolol for CAD  His updated medication list for this problem includes:    Metoprolol Tartrate 50 Mg Tabs (Metoprolol tartrate) .Marland Kitchen... Take 1 tablet by mouth once a day  BP today: 110/70 Prior BP: 110/70 (08/26/2008)  Labs Reviewed: K+: 4.3 (03/15/2008) Creat: : 1.0 (03/15/2008)   Chol: 221 (03/15/2008)   HDL: 47.8 (03/15/2008)   LDL: DEL (03/15/2008)   TG: 112 (03/15/2008)  Orders: TLB-Renal Function Panel (80069-RENAL) TLB-CBC Platelet - w/Differential (85025-CBCD) TLB-TSH (Thyroid Stimulating Hormone) (84443-TSH)  Problem # 3:  CORONARY ARTERY DISEASE (ICD-414.00) Assessment: Comment Only atypical chest pain discussed warning signs  His updated medication list for this problem includes:    Metoprolol Tartrate 50 Mg Tabs (Metoprolol tartrate) .Marland Kitchen... Take 1 tablet by mouth once a day    Adult Aspirin Low Strength 81 Mg Tbdp (Aspirin) .Marland Kitchen... Take 1 tablet by mouth once a day  Problem # 4:  HYPERLIPIDEMIA (ICD-272.4) Assessment: Comment Only  will check again on regular fish oil  Orders: Venipuncture (14782) TLB-Lipid Panel (80061-LIPID) TLB-Hepatic/Liver Function Pnl (80076-HEPATIC)  Problem # 5:  IMPAIRED FASTING GLUCOSE (ICD-790.21) Assessment: Comment Only  will check A1c  Orders: TLB-A1C / Hgb A1C (Glycohemoglobin) (83036-A1C)  Complete Medication List: 1)  Metoprolol Tartrate 50 Mg Tabs (Metoprolol tartrate) .... Take 1 tablet by mouth once a day 2)  Adult Aspirin Low Strength  81 Mg Tbdp (Aspirin) .... Take 1 tablet by mouth once a day 3)  Multivitamins Tabs (Multiple vitamin) .... Take 1 tablet by mouth once a day 4)  Trazodone Hcl 100 Mg Tabs (Trazodone hcl) .Marland Kitchen.. 1-2 at bedtime to help sleep 5)  Prilosec 20 Mg Cpdr (Omeprazole) .... Take 1 tablet by mouth once a day as needed for heartburn 6)  Fluticasone Propionate 50 Mcg/act Susp (Fluticasone propionate) .... 2 sprays per nostril daily  Patient Instructions: 1)  Please schedule a follow-up appointment in 6 months .   Current Allergies (reviewed today): LIPITOR SIMVASTATIN (SIMVASTATIN)

## 2010-05-29 NOTE — Assessment & Plan Note (Signed)
Summary: CPX/RBH   Vital Signs:  Patient Profile:   55 Years Old Male Height:     70.5 inches Weight:      217.13 pounds Temp:     97.9 degrees F oral Pulse rate:   60 / minute BP sitting:   134 / 80  (left arm) Cuff size:   large  Vitals Entered By: Wandra Mannan (March 05, 2007 8:26 AM)                 Chief Complaint:  cpx  fasting.  History of Present Illness: doing fairly well Still gets some crural itching despite mycostatin powder Not bad but still bothers him some  Discussed cholesterol Has been intolerant of statins    Current Allergies (reviewed today): LIPITOR SIMVASTATIN (SIMVASTATIN)  Past Medical History:    Reviewed history from 11/25/2006 and no changes required:       Coronary artery disease       GERD       Hyperlipidemia       Sleep disorder       Erectile dysfunction              CONSULTANTS       Dr Katrinka Blazing --cardio  Past Surgical History:    Reviewed history from 11/25/2006 and no changes required:       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       1970's--Hemorrhoidectomy       1980's--Septo/rhinoplasty       7/07  Cath/ unstable angina   Family History:    Reviewed history from 11/25/2006 and no changes required:       Dad died @69  ??cause       Mom alive--CVA       2 brothers        4 sisters       1 brother died of heart trouble        No other CAD, HTN, DM       Some cancer--both maternal uncles died of some cancer       Not aware of colon or prostate cancer  Social History:    Reviewed history from 11/25/2006 and no changes required:       Occupation: Installs signs (like for businesses)       Married-2nd           2 children from 1st marriage, 2 children with current wife       Former Smoker--quit 7/07       Alcohol use-rare    Review of Systems  The patient denies vision loss, decreased hearing, chest pain, syncope, dyspnea on exhertion, abdominal pain, melena, hematochezia, severe indigestion/heartburn, muscle  weakness, depression, abnormal bleeding, and enlarged lymph nodes.         Wears seat belt Some trouble with teeth--keeps up with dentist No bowel problems No urinary problems Sleep is broken up--awakens at night and has trouble getting back to sleep No sig daytime somnolence Still with erection problems---did try cialis but didn't help that much. Not sure of the dose Some back pain--?arthritis (it is strong in the family) Tries to walk but is very sporadic Has one spot on back wife is concerned about No sig mood problems   Physical Exam  General:     alert and normal appearance.   Eyes:     pupils equal, pupils round, pupils reactive to light, and no optic disk abnormalities.   Ears:     R ear  normal and L ear normal.   Mouth:     no lesions.   Neck:     supple, no masses, no thyromegaly, no carotid bruits, and no cervical lymphadenopathy.   Lungs:     normal respiratory effort and normal breath sounds.   Heart:     normal rate, regular rhythm, no murmur, and no gallop.   Abdomen:     soft, non-tender, and no masses.   Rectal:     deferred to colonoscopy Msk:     no joint tenderness and no joint swelling.   Pulses:     1+ in feet Extremities:     no edema Neurologic:     strength normal in all extremities.   Skin:     benign keratosis on back Axillary Nodes:     No palpable lymphadenopathy Psych:     normally interactive, good eye contact, not anxious appearing, and not depressed appearing.      Impression & Recommendations:  Problem # 1:  PREVENTIVE HEALTH CARE (ICD-V70.0) Assessment: Comment Only discussed fitness Flu, pneumovax PSA Colonoscopy  Problem # 2:  CORONARY ARTERY DISEASE (ICD-414.00) Assessment: Unchanged doing fine will add fish oil since intolerant of statins  His updated medication list for this problem includes:    Metoprolol Tartrate 50 Mg Tabs (Metoprolol tartrate) .Marland Kitchen... Take 1 tablet by mouth once a day    Adult Aspirin Low  Strength 81 Mg Tbdp (Aspirin) .Marland Kitchen... Take 1 tablet by mouth once a day  Orders: Venipuncture ([address]) TLB-Renal Function Panel (80069-RENAL) TLB-TSH (Thyroid Stimulating Hormone) (84443-TSH) TLB-CBC Platelet - w/Differential (85025-CBCD)   Problem # 3:  TINEA CRURIS (ICD-110.3) try nizoral cream, then mycostatin powder  Problem # 4:  ERECTILE DYSFUNCTION, ORGANIC (ICD-607.84) Assessment: Unchanged levitra samples given  Problem # 5:  SLEEP DISORDER, CHRONIC (ICD-780.50) try increasing trazodone  Complete Medication List: 1)  Metoprolol Tartrate 50 Mg Tabs (Metoprolol tartrate) .... Take 1 tablet by mouth once a day 2)  Adult Aspirin Low Strength 81 Mg Tbdp (Aspirin) .... Take 1 tablet by mouth once a day 3)  Multivitamins Tabs (Multiple vitamin) .... Take 1 tablet by mouth once a day 4)  Equate Sleep Aid  .... Hs 5)  Nexium 40 Mg Pack (Esomeprazole magnesium) .... Take 1 tablet by mouth once a day prn 6)  Ketoconazole 2 % Crea (Ketoconazole) .... Apply to rash two times a day till clear 7)  Mycostatin 100000 Unit/gm Powd (Nystatin) .... Apply to groin rash three times a day till clear 8)  Trazodone Hcl 100 Mg Tabs (Trazodone hcl) .Marland Kitchen.. 1-2 at bedtime to help sleep  Other Orders: TLB-Lipid Panel (80061-LIPID) TLB-ALT (SGPT) (84460-ALT) TLB-PSA (Prostate Specific Antigen) (84153-PSA) Flu Vaccine 26yrs + ([address]) Admin 1st Vaccine (09811) Pneumococcal Vaccine (91478) Admin of Any Addtl Vaccine (29562) Gastroenterology Referral (GI)   Patient Instructions: 1)  Please schedule a follow-up appointment in 6 months. 2)  Schedule a colonoscopy/sigmoidoscopy to help detect colon cancer.    Prescriptions: TRAZODONE HCL 100 MG  TABS (TRAZODONE HCL) 1-2 at bedtime to help sleep  #60 x 12   Entered and Authorized by:   Cindee Salt MD   Signed by:   Cindee Salt MD on 03/05/2007   Method used:   Electronically sent to ...       [facility]       [address]       [place]  [place], [place]  [address]       Ph: [phone]       Fax: [phone]   RxID:   [id number] MYCOSTATIN 100000 UNIT/GM  POWD (NYSTATIN) apply to groin rash three times a day till clear  #1 container x 3   Entered and Authorized by:   Cindee Salt MD   Signed by:   Cindee Salt MD on 03/05/2007   Method used:   Electronically sent to ...       [facility]       [address]       [place], [place]  [address]       Ph: [phone]       Fax: [phone]   RxID:   [id number] KETOCONAZOLE 2 %  CREA (KETOCONAZOLE) apply to rash two times a day till clear  #1 tube x 3   Entered and Authorized by:   Cindee Salt MD   Signed by:   Cindee Salt MD on 03/05/2007   Method used:   Electronically sent to ...       [facility]       [address]       [place], [place]  [address]       Ph: [phone]       Fax: [phone]   RxID:   [id number]  ] Current Allergies (reviewed today): LIPITOR SIMVASTATIN (SIMVASTATIN) Current Medications (including changes made in today's visit):  METOPROLOL TARTRATE 50 MG  TABS (METOPROLOL TARTRATE) Take 1 tablet by mouth once a day ADULT ASPIRIN LOW STRENGTH 81 MG  TBDP (ASPIRIN) Take 1 tablet by mouth once a day MULTIVITAMINS   TABS (MULTIPLE VITAMIN) Take 1 tablet by mouth once a day * EQUATE SLEEP AID hs NEXIUM 40 MG  PACK (ESOMEPRAZOLE MAGNESIUM) Take 1 tablet by mouth once a day prn KETOCONAZOLE 2 %  CREA (KETOCONAZOLE) apply to rash two times a day till clear MYCOSTATIN 100000 UNIT/GM  POWD (NYSTATIN) apply to groin rash three times a day till clear TRAZODONE HCL 100 MG  TABS (TRAZODONE HCL) 1-2 at bedtime to help sleep    Pneumococcal Vaccine # 1 (to be given today)   Pneumovax Vaccine (to be given today)   Influenza Vaccine    Vaccine Type: Fluvax 3+    Site: right deltoid     Mfr: Sanofi Pasteur    Dose: 0.5 ml    Route: IM    Given by: Wandra Mannan    Exp. Date: 10/27/2007    Lot #: [id number]    VIS given: 10/26/04 version given March 05, 2007.  Flu Vaccine Consent Questions    Do you have a history of severe allergic reactions to this vaccine? no    Any prior history of allergic reactions to egg and/or gelatin? no    Do you have a sensitivity to the preservative Thimersol? no    Do you have a past history of Guillan-Barre Syndrome? no    Do you currently have an acute febrile illness? no    Have you ever had a severe reaction to latex? no    Vaccine information given and explained to patient? yes     Pneumovax    Vaccine Type: Pneumovax    Site: left deltoid    Mfr: Merck    Dose: 0.5 ml  Route: IM    Given by: Wandra Mannan    Exp. Date: 03/04/2008    Lot #: [id number]    VIS given: 11/25/95 version given March 05, 2007.   Appended Document: CPX/RBH copy to Dr Katrinka Blazing  Appended Document: CPX/RBH Office Note faxed to Dr. Katrinka Blazing.

## 2010-05-29 NOTE — Assessment & Plan Note (Signed)
Summary: SINUS INFECTION...AS.   Vital Signs:  Patient Profile:   55 Years Old Male Height:     70.5 inches Weight:      219 pounds Temp:     97.9 degrees F oral Pulse rate:   72 / minute BP sitting:   112 / 70  (left arm) Cuff size:   large  Vitals Entered By: Shonna Chock (June 11, 2008 10:01 AM)                 Chief Complaint:  SICK OFF/ON SINCE NOVEMBER-09. HEAD FEELS FULL, EARACHE, SORE THROAT, and COUGH. ONSET: 1-2 WEEKS.  Acute Visit History:      The patient complains of cough, earache, fever, headache, nasal discharge, sinus problems, and sore throat.  These symptoms began 1 week ago.  Other comments include: HAs been sick off and on since 02/2008 Using Mucinex coricidn and claritin.        His highest temperature has been subjective.        The character of the cough is described as nonproductive.  There is no history of wheezing or shortness of breath associated with his cough.        The earache is located on both sides.        He complains of sinus pressure, teeth aching, ears being blocked, nasal congestion, purulent drainage, and epistaxis.           Current Allergies (reviewed today): LIPITOR SIMVASTATIN (SIMVASTATIN)  Past Medical History:    Reviewed history from 09/07/2007 and no changes required:       Coronary artery disease------------------------------------Dr Katrinka Blazing       Hypertension       Erectile dysfunction       Chronic sleep disorder       GERD       Hyperlipidemia       Colonic polyps, hx of--hyperplastic   Social History:    Reviewed history from 11/25/2006 and no changes required:       Occupation: Installs signs (like for businesses)       Married-2nd           2 children from 1st marriage, 2 children with current wife       Former Smoker--quit 7/07       Alcohol use-rare    Review of Systems      See HPI   Physical Exam  General:     Well-developed,well-nourished,in no acute distress; alert,appropriate and  cooperative throughout examination Head:     ttp B maxillary sinuses Ears:     clear fluid B TMs Nose:     nasal dischargemucosal pallor, swollen turbinates Mouth:     Oral mucosa and oropharynx without lesions or exudates.  the uvula is slightly erythematous and swollen and there is mild posterior pharynx erythema with significant PND present Neck:     no cervical or supraclavicular lymphadenopathy  Lungs:     Normal respiratory effort, chest expands symmetrically. Lungs are clear to auscultation, no crackles or wheezes. Heart:     Normal rate and regular rhythm. S1 and S2 normal without gallop, murmur, click, rub or other extra sounds.    Impression & Recommendations:  Problem # 1:  SINUSITIS- ACUTE-NOS (ICD-461.9) Treat with mucine, nasal saline and antibiotics. Given recurrent infection and likely poor control of allergies...will start nasal steroid. Continue Claritin. His updated medication list for this problem includes:    Amoxicillin 500 Mg Tabs (Amoxicillin) .Marland Kitchen... 2 tab by mouth  two times a day x 10 days    Fluticasone Propionate 50 Mcg/act Susp (Fluticasone propionate) .Marland Kitchen... 2 sprays per nostril daily   Complete Medication List: 1)  Metoprolol Tartrate 50 Mg Tabs (Metoprolol tartrate) .... Take 1 tablet by mouth once a day 2)  Adult Aspirin Low Strength 81 Mg Tbdp (Aspirin) .... Take 1 tablet by mouth once a day 3)  Multivitamins Tabs (Multiple vitamin) .... Take 1 tablet by mouth once a day 4)  Equate Sleep Aid  .... One by mouth at bedtime as needed 5)  Ketoconazole 2 % Crea (Ketoconazole) .... Apply to rash two times a day till clear 6)  Mycostatin 100000 Unit/gm Powd (Nystatin) .... Apply to groin rash three times a day till clear 7)  Trazodone Hcl 100 Mg Tabs (Trazodone hcl) .Marland Kitchen.. 1-2 at bedtime to help sleep 8)  Prilosec 20 Mg Cpdr (Omeprazole) .... Take 1 tablet by mouth once a day as needed for heartburn 9)  Amoxicillin 500 Mg Tabs (Amoxicillin) .... 2 tab by  mouth two times a day x 10 days 10)  Fluticasone Propionate 50 Mcg/act Susp (Fluticasone propionate) .... 2 sprays per nostril daily    Prescriptions: FLUTICASONE PROPIONATE 50 MCG/ACT SUSP (FLUTICASONE PROPIONATE) 2 sprays per nostril daily  #1 x 1   Entered and Authorized by:   Kerby Nora MD   Signed by:   Kerby Nora MD on 06/11/2008   Method used:   Electronically to        [facility]* (retail)       [address]       [place], [place]  [address]       Ph: [phone]       Fax: [phone]   RxID:   [id number] AMOXICILLIN 500 MG TABS (AMOXICILLIN) 2 tab by mouth two times a day x 10 days  #40 x 0   Entered and Authorized by:   Kerby Nora MD   Signed by:   Kerby Nora MD on 06/11/2008   Method used:   Electronically to        [facility]* (retail)       [address]       [place], [place]  [address]       Ph: [phone]       Fax: [phone]   RxID:   [id number]

## 2010-05-29 NOTE — Letter (Signed)
Summary: Out of Work  [facility]  [address] [place], [place] [address]   Phone: [phone]  Fax: [phone]    January 26, 2010   Employee:  HENRRY FEIL    To Whom It May Concern:   For Medical reasons, please excuse the above named employee from work for the following dates:  Start:  January 26, 2010   End:   February 01, 2010  If you need additional information, please feel free to contact our office.         Sincerely,    Eustaquio Boyden  MD

## 2010-05-29 NOTE — Assessment & Plan Note (Signed)
Summary: 8:00 appt   6 month follow up/rbh   Vital Signs:  Patient Profile:   55 Years Old Male Height:     70.5 inches Weight:      224 pounds Temp:     97.0 degrees F oral Pulse rate:   72 / minute Pulse rhythm:   regular BP sitting:   112 / 80  (left arm) Cuff size:   large  Vitals Entered By: Sydell Axon (March 15, 2008 8:04 AM)                 Chief Complaint:  6 month followup.  History of Present Illness: Still with some stuffy nose Seemed to improve briefly, now worse again By the end of the anitbiotic bottle, felt it helped a little Has hay fever but generally not this bad No fever some cough, occ slight mucus but generally dry--mostly in AM Awakens him in night---wife thinks he is congested Hasn't tried anithistamine other than night cold med No headache, ear pain slight sore throat--mostly in AM  Feels his usual otherwise Gnerally has restless sleep even when not sick. Awakens at 3:30-4AM, then hard to get back to sleep. Initiates well with trazodone. 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       1970's--Hemorrhoidectomy       1980's--Septo/rhinoplasty       7/07  Cath/ unstable angina   Family History:    Dad died @69  ??cause    Mom died @81  CVA and ?MI    2 brothers     4 sisters    1 brother died of heart trouble     No other CAD, HTN, DM    Some cancer--both maternal uncles died of some cancer    Not aware of colon or prostate cancer  Social History:  Reviewed history from 11/25/2006 and no changes required:       Occupation: Installs signs (like for businesses)       Married-2nd           2 children from 1st marriage, 2 children with current wife       Former Smoker--quit 7/07       Alcohol use-rare    Review of Systems       The patient complains of chest pain.  The patient denies syncope and dyspnea on exertion.         No sig depression--is grieving Mom who recently died No anxiety  Slight left chest pain, 4/10----feels like a muscle pull. Not exertional. Self limited for up to 30 seconds Sees cardiologist next week--stress test negative within the past year Has gained 5# or so over 6 months   Physical Exam  General:     alert.  NAD Head:     no sinus tenderness but does have sensation of maxillary and frontal fullness Ears:     R  ear normal and L ear normal.   Nose:     marked pale congestion Mouth:     no exudates and no lesions.  Uvula is inflamed Neck:     supple, no masses, no thyromegaly, and no cervical lymphadenopathy.   Lungs:     normal respiratory effort and normal breath sounds.   Heart:     normal rate, regular rhythm, no murmur, and no gallop.   Extremities:     no edema Skin:     no rashes and no suspicious lesions.   Psych:     normally interactive, good eye contact, not anxious appearing, and not depressed appearing.      Impression & Recommendations:  Problem # 1:  SINUSITIS- ACUTE-NOS (ICD-461.9) Assessment: Unchanged not better Hard to tell if infectious or allergic will add cetirizine at bedtime and try augmentin   Problem # 2:  HYPERTENSION (ICD-401.9) Assessment: Unchanged good control no changes needed  His updated medication list for this problem includes:    Metoprolol Tartrate 50 Mg Tabs (Metoprolol tartrate) .Marland Kitchen... Take 1 tablet by mouth once a day  BP today: 112/80 Prior BP: 130/86 (02/29/2008)  Labs Reviewed: Creat: 1.0 (03/05/2007) Chol: 235 (03/05/2007)   HDL:  41.6 (03/05/2007)   LDL: 168.3 (03/05/2007)   TG: 111 (03/05/2007)  Orders: Venipuncture ([address]) TLB-Renal Function Panel (80069-RENAL) TLB-CBC Platelet - w/Differential (85025-CBCD) TLB-Hepatic/Liver Function Pnl (80076-HEPATIC) TLB-TSH (Thyroid Stimulating Hormone) (84443-TSH)   Problem # 3:  SLEEP DISORDER, CHRONIC (ICD-780.50) Assessment: Unchanged try the trazodone 200mg  stay away from cold meds  Problem # 4:  CORONARY ARTERY DISEASE (ICD-414.00) Assessment: Unchanged chest pain doesn't sound ischemic has follow up with Dr Katrinka Blazing  His updated medication list for this problem includes:    Metoprolol Tartrate 50 Mg Tabs (Metoprolol tartrate) .Marland Kitchen... Take 1 tablet by mouth once a day    Adult Aspirin Low Strength 81 Mg Tbdp (Aspirin) .Marland Kitchen... Take 1 tablet by mouth once a day   Problem # 5:  HYPERLIPIDEMIA (ICD-272.4) Assessment: Unchanged on fish oil Orders: TLB-Lipid Panel (80061-LIPID)   Problem # 6:  GERD (ICD-530.81) Assessment: Unchanged quiet with as needed prilosec His updated medication list for this problem includes:    Prilosec 20 Mg Cpdr (Omeprazole) .Marland Kitchen... Take 1 tablet by mouth once a day as needed for heartburn   Complete Medication List: 1)  Metoprolol Tartrate 50 Mg Tabs (Metoprolol tartrate) .... Take 1 tablet by mouth once a day 2)  Adult Aspirin Low Strength 81 Mg Tbdp (Aspirin) .... Take 1 tablet by mouth once a day 3)  Multivitamins Tabs (Multiple vitamin) .... Take 1 tablet by mouth once a day 4)  Equate Sleep Aid  .... One by mouth at bedtime as needed 5)  Ketoconazole 2 % Crea (Ketoconazole) .... Apply to rash two times a day till clear 6)  Mycostatin 100000 Unit/gm Powd (Nystatin) .... Apply to groin rash three times a day till clear 7)  Trazodone Hcl 100 Mg Tabs (Trazodone hcl) .Marland Kitchen.. 1-2 at bedtime to help sleep 8)  Prilosec 20 Mg Cpdr (Omeprazole) .... Take 1 tablet by mouth once a day as needed for heartburn 9)  Amoxicillin-pot Clavulanate  875-125 Mg Tabs (Amoxicillin-pot clavulanate) .Marland Kitchen.. 1 two times a day  Other Orders: TLB-PSA (Prostate Specific Antigen) (84153-PSA)   Patient Instructions: 1)  Try cetirizine 10mg  at bedtime for allergies 2)  Please schedule a follow-up appointment in 6 months for physical   Prescriptions: AMOXICILLIN-POT CLAVULANATE 875-125 MG TABS (AMOXICILLIN-POT CLAVULANATE) 1  two times a day  #20 x 0   Entered and Authorized by:   Cindee Salt MD   Signed by:   Cindee Salt MD on 03/15/2008   Method used:   Electronically to        [facility]* (retail)       [address]       [place], [place]  [address]       Ph: [phone]       Fax: [phone]   RxID:   [id number]  ] Current Allergies (reviewed today): LIPITOR SIMVASTATIN (SIMVASTATIN)

## 2010-05-29 NOTE — Procedures (Signed)
Summary: Colonoscopy w/ hot biopsy  Colonoscopy w/ hot biopsy   Imported By: Beau Fanny 04/09/2007 13:11:27  _____________________________________________________________________  External Attachment:    Type:   Image     Comment:   External Document  Appended Document: Colonoscopy w/ hot biopsy    Clinical Lists Changes  Observations: Added new observation of COLONOSCOPY: Diverticulosis Hyperplastic polyp Dr Arlyce Dice (04/03/2007 14:08)        Colonoscopy  Procedure date:  04/03/2007  Findings:      Diverticulosis Hyperplastic polyp Dr Arlyce Dice

## 2010-05-29 NOTE — Letter (Signed)
Summary: Out of Work  [facility]  [address] [place], [place] [address]   Phone: [phone]  Fax: [phone]    January 30, 2010   Employee:  BUTLER VEGH    To Whom It May Concern:   For Medical reasons, please excuse the above named employee from work for the following dates:  Start:  January 30, 2010 (seen in clinic today)   End:   until next seen (next week)  If you need additional information, please feel free to contact our office.         Sincerely,    Eustaquio Boyden  MD
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
## 2010-05-29 NOTE — Assessment & Plan Note (Signed)
Summary: 6 MONTH FOLLOW UP   Vital Signs:  Patient Profile:   54 Years Old Male Height:     70.5 inches Weight:      218.13 pounds Temp:     97.4 degrees F oral Pulse rate:   68 / minute BP sitting:   108 / 70  (left arm) Cuff size:   large  Vitals Entered By: Wandra Mannan (Sep 07, 2007 9:17 AM)                 Chief Complaint:  6 month follow up.    Current Allergies (reviewed today): LIPITOR SIMVASTATIN (SIMVASTATIN)        Complete Medication List: 1)  Metoprolol Tartrate 50 Mg Tabs (Metoprolol tartrate) .... Take 1 tablet by mouth once a day 2)  Adult Aspirin Low Strength 81 Mg Tbdp (Aspirin) .... Take 1 tablet by mouth once a day 3)  Multivitamins Tabs (Multiple vitamin) .... Take 1 tablet by mouth once a day 4)  Equate Sleep Aid  .... Hs 5)  Ketoconazole 2 % Crea (Ketoconazole) .... Apply to rash two times a day till clear 6)  Mycostatin 100000 Unit/gm Powd (Nystatin) .... Apply to groin rash three times a day till clear 7)  Trazodone Hcl 100 Mg Tabs (Trazodone hcl) .Marland Kitchen.. 1-2 at bedtime to help sleep 8)  Prilosec 20 Mg Cpdr (Omeprazole) .... Take 1 tablet by mouth once a day    ] Current Allergies (reviewed today): LIPITOR SIMVASTATIN (SIMVASTATIN) Current Medications (including changes made in today's visit):  METOPROLOL TARTRATE 50 MG  TABS (METOPROLOL TARTRATE) Take 1 tablet by mouth once a day ADULT ASPIRIN LOW STRENGTH 81 MG  TBDP (ASPIRIN) Take 1 tablet by mouth once a day MULTIVITAMINS   TABS (MULTIPLE VITAMIN) Take 1 tablet by mouth once a day * EQUATE SLEEP AID hs KETOCONAZOLE 2 %  CREA (KETOCONAZOLE) apply to rash two times a day till clear MYCOSTATIN 100000 UNIT/GM  POWD (NYSTATIN) apply to groin rash three times a day till clear TRAZODONE HCL 100 MG  TABS (TRAZODONE HCL) 1-2 at bedtime to help sleep PRILOSEC 20 MG  CPDR (OMEPRAZOLE) Take 1 tablet by mouth once a day   Appended Document: 6 MONTH FOLLOW UP      History of Present  Illness: had colonoscopy--hyperplastic polyps heartburn has been controlled on omeprazole--uses as needed only  Has been sleeping well Trazodone only as needed still  No heart trouble Had recent stress test with Dr Conley Canal good Tries to stay active Walks many days No chest pain or SOB  Has had some allergy problems Hasn't taken meds No major eye problems Only occ sneezing    Current Allergies: LIPITOR SIMVASTATIN (SIMVASTATIN)  Past Medical History:    Reviewed history from 05/16/2007 and no changes required:       Coronary artery disease------------------------------------Dr Katrinka Blazing       Hypertension       Erectile dysfunction       Chronic sleep disorder       GERD       Hyperlipidemia       Colonic polyps, hx of--hyperplastic  Past Surgical History:    Reviewed history from 11/25/2006 and no changes required:       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   Neck:     supple, no masses, no thyromegaly, no carotid bruits, and no cervical lymphadenopathy.   Lungs:     normal respiratory effort and normal breath sounds.   Heart:     normal rate, regular rhythm, no murmur, and no gallop.   Msk:     no joint tenderness and no joint swelling.   Pulses:     1+ in feet Extremities:     no edema or calf tenderness Skin:     no rashes and no suspicious lesions.   Psych:     normally interactive, good eye contact, not anxious appearing,  and not depressed appearing.      Impression & Recommendations:  Problem # 1:  HYPERTENSION (ICD-401.9) Assessment: Unchanged BP is fine No changes needed Beta blocker for heart disease  His updated medication list for this problem includes:    Metoprolol Tartrate 50 Mg Tabs (Metoprolol tartrate) .Marland Kitchen... Take 1 tablet by mouth once a day  Prior BP: 108/70 (09/07/2007)  Labs Reviewed: Creat: 1.0 (03/05/2007) Chol: 235 (03/05/2007)   HDL: 41.6 (03/05/2007)   LDL: DEL (03/05/2007)   TG: 111 (03/05/2007)   Problem # 2:  CORONARY ARTERY DISEASE (ICD-414.00) Assessment: Unchanged quiet reports recent bengn stress test  His updated medication list for this problem includes:    Metoprolol Tartrate 50 Mg Tabs (Metoprolol tartrate) .Marland Kitchen... Take 1 tablet by mouth once a day    Adult Aspirin Low Strength 81 Mg Tbdp (Aspirin) .Marland Kitchen... 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 Take 1 tablet by mouth once a day 2)  Adult Aspirin Low Strength 81 Mg Tbdp (Aspirin) .... Take 1 tablet by mouth once a day 3)  Multivitamins Tabs (Multiple vitamin) .... Take 1 tablet by mouth once a day 4)  Equate Sleep Aid  .... Hs 5)  Ketoconazole 2 % Crea (Ketoconazole) .... Apply to rash two times a day till clear 6)  Mycostatin 100000 Unit/gm Powd (Nystatin) .... Apply to groin rash three times a day till clear 7)  Trazodone Hcl 100 Mg Tabs (Trazodone hcl) .Marland Kitchen.. 1-2 at bedtime to help sleep 8)  Prilosec 20 Mg Cpdr (Omeprazole) .... Take 1 tablet by mouth once a day   Patient Instructions: 1)  Please schedule a follow-up appointment in 6 months.   ]  Appended Document: 6 MONTH  FOLLOW UP copy to Dr [employer]  Appended Document: 6 MONTH FOLLOW UP Office Note faxed to Dr.Smith.

## 2010-05-29 NOTE — Progress Notes (Signed)
Summary: Patient follow up  ---- Converted from flag ---- ---- 02/15/2010 1:51 PM, Eustaquio Boyden  MD wrote: can we follow up on how patient is doing, and also let his case worker know that we do have records of [facility] ER visit in case she needs?  thanks. ------------------------------  Spoke with patient's case worker, Administrator, arts. She said she just recieved the ER records last week, so she doesn't need [place] to fax a copy to her. She also said Mr. Antonson followed up with Dr. Huston Foley again today and is slowly improving. She said he is still having dizziness, but only with sitting to standing now. He can turn from side to side without dizziness. So, there is improvement, just slow. He is still OOW until Dr. Huston Foley releases him. Kim Dance CMA Duncan Dull)  February 15, 2010 2:32 PM   thanks. Eustaquio Boyden  MD  February 15, 2010 2:52 PM

## 2010-05-29 NOTE — Progress Notes (Signed)
Summary: rxn to meds  Phone Note Call from Patient Call back at Home Phone [phone]   Caller: Spouse-scarlett Call For: letvak Summary of Call: pt is on simvastatin, he is c/o hand pain he can hardly use them. wife says he was on zocor in the past and had alot of problems on med shoud he d/c it? Initial call taken by: Liane Comber,  December 17, 2006 10:55 AM  Follow-up for Phone Call        yes he should stop it I would not start another cholesterol med until we talk about it Follow-up by: Cindee Salt MD,  December 17, 2006 1:01 PM  Additional Follow-up for Phone Call Additional follow up Details #1::        wife advised, he has appt for cpx in oct will discuss then ..................................................................Marland KitchenLiane Comber  December 17, 2006 1:09 PM    New Allergies: SIMVASTATIN (SIMVASTATIN) New Allergies: SIMVASTATIN (SIMVASTATIN)

## 2010-05-29 NOTE — Progress Notes (Signed)
Summary: Trazodone  Phone Note Refill Request Message from:  Fax from Pharmacy on May 01, 2009 11:11 AM  Refills Requested: Medication #1:  TRAZODONE HCL 100 MG  TABS 1-2 at bedtime to help sleep [place]  Phone:   [phone]   Method Requested: Telephone to Pharmacy Initial call taken by: Delilah Shan CMA (AAMA),  May 01, 2009 11:11 AM    Prescriptions: TRAZODONE HCL 100 MG  TABS (TRAZODONE HCL) 1-2 at bedtime to help sleep  #60 x 12   Entered and Authorized by:   Ruthe Mannan MD   Signed by:   Ruthe Mannan MD on 05/01/2009   Method used:   Electronically to        [facility]* (retail)       [address]       [place], [place]  [address]       Ph: [phone]       Fax: [phone]   RxID:   [id number]

## 2010-05-29 NOTE — Letter (Signed)
Summary: Patient Scheduled for MRI Brain/[facility]  Patient Scheduled for MRI Brain/[facility]   Imported By: Lanelle Bal 02/13/2010 10:46:27  _____________________________________________________________________  External Attachment:    Type:   Image     Comment:   External Document

## 2010-05-29 NOTE — Procedures (Signed)
Summary: Flexible Sigmoidoscopy  Flexible Sigmoidoscopy   Imported By: Beau Fanny 03/06/2007 14:53:55  _____________________________________________________________________  External Attachment:    Type:   Image     Comment:   External Document
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
## 2010-05-29 NOTE — Assessment & Plan Note (Signed)
Summary: ? Infection on face   Vital Signs:  Patient profile:   55 year old male Height:      70.5 inches Weight:      218 pounds BMI:     30.95 Temp:     98 degrees F oral Pulse rate:   68 / minute Pulse rhythm:   regular BP sitting:   108 / 70  (left arm) Cuff size:   large  Vitals Entered By: Liane Comber (August 18, 2008 8:02 AM)  History of Present Illness: bump came up on face - last weekend  now hurting and getting bigger  put some peroxide , and some cortisone cream  pain is getting better /sore to the touch  ? if had a bit of some sore- does not remember  no fever , feeling a little tired overall   no sinus pain lately   never had mrsa or had exposure to it   no other spots or rash   pt states he is borderline DM   Allergies: 1)  Lipitor 2)  Simvastatin (Simvastatin)  Past History:  Past Medical History:    Coronary artery disease------------------------------------Dr Katrinka Blazing    Hypertension    Erectile dysfunction    Chronic sleep disorder    GERD    Hyperlipidemia    Colonic polyps, hx of--hyperplastic     (09/07/2007)  Past Surgical History:    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    1970's--Hemorrhoidectomy    1980's--Septo/rhinoplasty    7/07  Cath/ unstable angina (11/25/2006)  Family History:    Dad died @69  ??cause    Mom died @81  CVA and ?MI    2 brothers     4 sisters    1 brother died of heart trouble     No other CAD, HTN, DM    Some cancer--both maternal uncles died of some cancer    Not aware of colon or prostate cancer (03/15/2008)  Social History:    Occupation: Counsellor signs (like for businesses)    Married-2nd        2 children from 1st marriage, 2 children with current wife    Former Smoker--quit 7/07    Alcohol use-rare     (11/25/2006)  Review of Systems General:  Complains of fatigue; denies chills, fever, loss of appetite, and malaise. Eyes:  Denies blurring, discharge, eye irritation, and eye pain. ENT:  Denies  earache, sinus pressure, and sore throat. CV:  Denies chest pain or discomfort and palpitations. Resp:  Denies chest pain with inspiration. GI:  Denies nausea and vomiting. MS:  Denies joint pain. Derm:  Complains of itching and lesion(s). Neuro:  Denies numbness and tingling.  Physical Exam  General:  Well-developed,well-nourished,in no acute distress; alert,appropriate and cooperative throughout examination Head:  normocephalic and atraumatic.   1 cm area of induration/erythema R cheek with scab, firm and not fluctuatnt no crust or drainage  no streaking  no other rash or skin lesion Eyes:  vision grossly intact, pupils equal, pupils round, pupils reactive to light, and no injection.   Nose:  no nasal discharge.   Mouth:  pharynx pink and moist.   Neck:  supple with full rom and no masses or thyromegally, no JVD or carotid bruit  Lungs:  Normal respiratory effort, chest expands symmetrically. Lungs are clear to auscultation, no crackles or wheezes. Heart:  Normal rate and regular rhythm. S1 and S2 normal without gallop, murmur, click, rub or other extra sounds. Skin:  see face  exam Cervical Nodes:  No lymphadenopathy noted Psych:  normal affect, talkative and pleasant    Impression & Recommendations:  Problem # 1:  CELLULITIS, FACE, RIGHT (ICD-682.0) Assessment New small area of induration- without signs of abcess or zoster will tx with bactrim DS (to cover for poss mrsa)- no material avail to cx  bactroban oint  adv to keep clean, warm comp as needed  worse/no imp- update asap or if fever  The following medications were removed from the medication list:    Amoxicillin 500 Mg Tabs (Amoxicillin) .Marland Kitchen... 2 tab by mouth two times a day x 10 days His updated medication list for this problem includes:    Bactrim Ds 800-160 Mg Tabs (Sulfamethoxazole-trimethoprim) .Marland Kitchen... 1 by mouth two times a day for 10 days  Complete Medication List: 1)  Metoprolol Tartrate 50 Mg Tabs (Metoprolol  tartrate) .... Take 1 tablet by mouth once a day 2)  Adult Aspirin Low Strength 81 Mg Tbdp (Aspirin) .... Take 1 tablet by mouth once a day 3)  Multivitamins Tabs (Multiple vitamin) .... Take 1 tablet by mouth once a day 4)  Equate Sleep Aid  .... One by mouth at bedtime as needed 5)  Ketoconazole 2 % Crea (Ketoconazole) .... Apply to rash two times a day till clear 6)  Mycostatin 100000 Unit/gm Powd (Nystatin) .... Apply to groin rash three times a day till clear 7)  Trazodone Hcl 100 Mg Tabs (Trazodone hcl) .Marland Kitchen.. 1-2 at bedtime to help sleep 8)  Prilosec 20 Mg Cpdr (Omeprazole) .... Take 1 tablet by mouth once a day as needed for heartburn 9)  Fluticasone Propionate 50 Mcg/act Susp (Fluticasone propionate) .... 2 sprays per nostril daily 10)  Bactrim Ds 800-160 Mg Tabs (Sulfamethoxazole-trimethoprim) .Marland Kitchen.. 1 by mouth two times a day for 10 days 11)  Bactroban 2 % Oint (Mupirocin) .... Apply to affected area two times a day  Patient Instructions: 1)  take the bactrim DS for 10 days  2)  you must use sun protection  3)  use bactroban ointment on area of redness 4)  if worse (redness/swelling/size or pain ), or if not improving in 2-3 days - call  5)  if any fever- call  Prescriptions: BACTROBAN 2 % OINT (MUPIROCIN) apply to affected area two times a day  #1 med x 0   Entered and Authorized by:   Judith Part MD   Signed by:   Judith Part MD on 08/18/2008   Method used:   Print then Give to Patient   RxID:   [id number] BACTRIM DS 800-160 MG TABS (SULFAMETHOXAZOLE-TRIMETHOPRIM) 1 by mouth two times a day for 10 days  #20 x 0   Entered and Authorized by:   Judith Part MD   Signed by:   Judith Part MD on 08/18/2008   Method used:   Print then Give to Patient   RxID:   [id number]   Prior Medications (reviewed today): METOPROLOL TARTRATE 50 MG  TABS (METOPROLOL TARTRATE) Take 1 tablet by mouth once a day ADULT ASPIRIN LOW STRENGTH 81 MG  TBDP (ASPIRIN) Take 1  tablet by mouth once a day MULTIVITAMINS   TABS (MULTIPLE VITAMIN) Take 1 tablet by mouth once a day EQUATE SLEEP AID () One by mouth at bedtime as needed KETOCONAZOLE 2 %  CREA (KETOCONAZOLE) apply to rash two times a day till clear MYCOSTATIN 100000 UNIT/GM  POWD (NYSTATIN) apply to groin rash three times a day till clear TRAZODONE HCL  100 MG  TABS (TRAZODONE HCL) 1-2 at bedtime to help sleep PRILOSEC 20 MG  CPDR (OMEPRAZOLE) Take 1 tablet by mouth once a day as needed for heartburn FLUTICASONE PROPIONATE 50 MCG/ACT SUSP (FLUTICASONE PROPIONATE) 2 sprays per nostril daily Current Allergies (reviewed today): LIPITOR SIMVASTATIN (SIMVASTATIN) Current Medications (including changes made in today's visit):  METOPROLOL TARTRATE 50 MG  TABS (METOPROLOL TARTRATE) Take 1 tablet by mouth once a day ADULT ASPIRIN LOW STRENGTH 81 MG  TBDP (ASPIRIN) Take 1 tablet by mouth once a day MULTIVITAMINS   TABS (MULTIPLE VITAMIN) Take 1 tablet by mouth once a day * EQUATE SLEEP AID One by mouth at bedtime as needed KETOCONAZOLE 2 %  CREA (KETOCONAZOLE) apply to rash two times a day till clear MYCOSTATIN 100000 UNIT/GM  POWD (NYSTATIN) apply to groin rash three times a day till clear TRAZODONE HCL 100 MG  TABS (TRAZODONE HCL) 1-2 at bedtime to help sleep PRILOSEC 20 MG  CPDR (OMEPRAZOLE) Take 1 tablet by mouth once a day as needed for heartburn FLUTICASONE PROPIONATE 50 MCG/ACT SUSP (FLUTICASONE PROPIONATE) 2 sprays per nostril daily BACTRIM DS 800-160 MG TABS (SULFAMETHOXAZOLE-TRIMETHOPRIM) 1 by mouth two times a day for 10 days BACTROBAN 2 % OINT (MUPIROCIN) apply to affected area two times a day    Preventive Care Screening  Last Tetanus Booster:    Date:  04/29/2006    Results:  Td

## 2010-05-29 NOTE — Consult Note (Signed)
Summary: Consultation Report  Consultation Report   Imported By: Beau Fanny 11/25/2006 15:05:36  _____________________________________________________________________  External Attachment:    Type:   Image     Comment:   External Document

## 2010-05-29 NOTE — Progress Notes (Signed)
Summary: still very dizzy  Phone Note Call from Patient Call back at Home Phone [phone]   Caller: Spouse- scarlette Call For: Dr. Sharen Hones Summary of Call: Pt was see earlier this week for head injury.  He is still dizzy, wife says this is getting worse, everything is spinning.  Vomited x one last night. Didnt take predinisone last night or this morning.  Please advise on next step.  Initial call taken by: Lowella Petties CMA,  February 02, 2010 8:27 AM  Follow-up for Phone Call        please set up with PT vestibular rehab.  referral in chart. 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  g

## 2010-05-29 NOTE — Letter (Signed)
Summary: Out of Work  [employer] at [facility]  [address] [place], [place] [address]   Phone: [phone]  Fax: [phone]    February 05, 2010   Employee:  TIMMEY LAMBA    To Whom It May Concern:   For Medical reasons, please excuse the above named employee from work for the following dates:  Start:  February 05, 2010   End:   until evaluated by Dr. Huston Foley  If you need additional information, please feel free to contact our office.         Sincerely,    Eustaquio Boyden  MD

## 2010-05-29 NOTE — Progress Notes (Signed)
Summary: Update on spot on face  Phone Note Call from Patient Call back at [phone]   Caller: Spouse Call For: Dr. Milinda Antis Summary of Call: Calling to find out if patient needs to still come back in?  The place on his face drained yesterday after using hot compress on it.  The scab is off and there is a hole with alot of the swelling  gone and no fever.  It still looks pretty bad.  Does he still need to come back in? Initial call taken by: Sydell Axon,  August 22, 2008 10:37 AM  Follow-up for Phone Call        yes please schedule follow up  Follow-up by: Judith Part MD,  August 22, 2008 11:04 AM  Additional Follow-up for Phone Call Additional follow up Details #1::        Wife noified  ..........................................................Marland KitchenLiane Comber August 22, 2008 12:46 PM Appointment was scheduled for patient.  ..........................................................Marland KitchenLiane Comber August 22, 2008 12:46 PM

## 2010-05-29 NOTE — Letter (Signed)
Summary: Primary Care Appointment Letter  [facility]  [address] [place], [place] [address]   Phone: [phone]  Fax: [phone]    06/09/2008 MRN: [id number]  [name] [address] [place], [place]  [address]  Dear Mr. Bingley,   Your Primary Care Physician Cindee Salt MD has indicated that:    _______it is time to schedule an appointment.    _______you missed your appointment on______ and need to call and          reschedule.    _______you need to have lab work done.    _______you need to schedule an appointment discuss lab or test results.    _______you need to call to reschedule your appointment that is                       scheduled on _________.     Please call our office as soon as possible. Our phone number is [phone]          _________. Please press option 1. Our office is open 8a-12noon and 1p-5p, Monday through Friday.     Thank you,    [facility]

## 2010-05-29 NOTE — Progress Notes (Signed)
Summary: Brain MRI  Phone Note Other Incoming   Caller: [name]-Worker's Comp Case Manager [phone] Summary of Call: [name] left message stating that she had gotten [name] in with Dr. Syliva [name] who is a neurologist in [place] for his dizziness on October 11th. Dr. Huston [name] was requesting a brain MRI prior to seeing the patient. [name] was wondering if you could order one for him. She said Dr. Huston [name] normally leaves it up to the radiologist whether or not it requires contrast. If we order the MRI, the order needs to be faxed to [facility] urgently at [phone] so that she can get it approved through [employer] since he is Worker's Comp. Please advise. Initial call taken by: Janee Morn CMA Duncan Dull),  February 02, 2010 11:25 AM  Follow-up for Phone Call        ok to get brain MRI.  thank you.  sent back to kim Follow-up by: Eustaquio Boyden  MD,  February 02, 2010 12:13 PM  Additional Follow-up for Phone Call Additional follow up Details #1::        Forwarded to [facility] Additional Follow-up by: Janee Morn CMA Duncan Dull),  February 02, 2010 2:31 PM

## 2010-05-29 NOTE — Assessment & Plan Note (Signed)
Summary: ONE WEEK FOLLOW UP / LFW   Vital Signs:  Patient profile:   55 year old male Weight:      236 pounds Temp:     97.8 degrees F oral Pulse rate:   76 / minute Pulse rhythm:   regular BP sitting:   142 / 80  (left arm) Cuff size:   large  Vitals Entered By: Selena Batten Dance CMA Duncan Dull) (February 05, 2010 4:20 PM) CC: 1 week follow up   History of Present Illness: CC: 1wk f/u of vertigo after fall  DOI: 01/24/2010, worker's comp involved  55 yo with HA/dizziness after 6 foot fall off bucket truck.  Did have LOC a few minutes, hit head, had hard hat on.  Went to [place] ER, normal head CT, broke L wrist.  Seen Friday after accident with continued HA, worsening dizziness, nystagmus and vertigo.  Concern with progressing sxs so sent for urgent head CT, normal.  Saw hand doctor [facility]) last week - found to have distal radial fracture, volar plate PIP joint fracture.  Saw ortho spine doctor last week - xrays normal, thought whiplash.  Today - vomiting, dizzy, unsteadiness walking, got very bad Friday during day with vomiting and actually falling out of bed.  Rest of weekend with shakes, dizziness, vertigo, somewhat better today, but still feels drunk.  Stopped prednisone prescribed by ortho for neck 2/2 thinking contributing to feeling poorly.  To see Dr. Huston Foley in [facility] tomorrow for vertigo.    Allergies: 1)  Lipitor 2)  Simvastatin (Simvastatin)  Past History:  Past medical, surgical, family and social histories (including risk factors) reviewed for relevance to current acute and chronic problems.  Past Medical History: Reviewed history from 12/02/2008 and no changes required. Coronary artery disease------------------------------------Dr Katrinka Blazing Hypertension Erectile dysfunction Chronic sleep disorder GERD Hyperlipidemia Colonic polyps, hx of--hyperplastic Impaired fasting glucose  Past Surgical History: 4/99  Branchial cleft  cyst 1970's--Hemorrhoidectomy 1980's--Septo/rhinoplasty 7/07  Cath/ unstable angina 9/11  6 foot fall onto head, concussion, L wrist (comminuted with intra-articular extension) and 2nd finger (middle phalanx) fxs, vertigo  Family History: Reviewed history from 03/15/2008 and no changes required. Dad died @69  ??cause Mom died @81  CVA and ?MI 2 brothers  4 sisters 1 brother died of heart trouble  No other CAD, HTN, DM Some cancer--both maternal uncles died of some cancer Not aware of colon or prostate cancer  Social History: Reviewed history from 11/25/2006 and no changes required. 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 Neurologic:  CN 2-12 intact.  Sensation intact, strength intact, normal finger to nose.    + nystagmus with vertigo with dix hallpike bilaterally.  improved since last visit.   Impression & Recommendations:  Problem # 1:  HEAD TRAUMA (ICD-959.01) with residual vertigo.  see below.  Problem # 2:  VERTIGO (ICD-780.4) Last week vertigo thought to be BPPV, treated with modified epley's to do at home.  however this has worsened vertigo with nausea.  Had discussed PT for vestibular rehab, however concern that patient would not currently tolerate exercises.  Agree with neuro referral.  Head CT neg x 2, brain MRI this morning, report seen and read as normal.  unclear why continued severe BPPV (which could occur after  trauma), would have expected it to improve with epley repositioning and time.  Pt declines meclizine.  Complete Medication List: 1)  Prilosec 20 Mg Cpdr (Omeprazole) .... Take 1 tablet by mouth once a day as needed for heartburn 2)  Metoprolol Tartrate 50 Mg Tabs (Metoprolol tartrate) .... Take 1 tablet by mouth  once a day 3)  Trazodone Hcl 100 Mg Tabs (Trazodone hcl) .Marland Kitchen.. 1-2 at bedtime to help sleep 4)  Fluticasone Propionate 50 Mcg/act Susp (Fluticasone propionate) .... 2 sprays per nostril daily 5)  Adult Aspirin Low Strength 81 Mg Tbdp (Aspirin) .... Take 1 tablet by mouth once a day 6)  Multivitamins Tabs (Multiple vitamin) .... Take 1 tablet by mouth once a day 7)  Viagra 100 Mg Tabs (Sildenafil citrate) .... 1/2 - 1 tab about 30-60 minutes before sex 8)  Oxycodone-acetaminophen 5-325 Mg Tabs (Oxycodone-acetaminophen) .Marland Kitchen.. 1-2 by mouth every 6 hours as needed severe pain  Patient Instructions: 1)  hold on referral for physical therapy until you see neurologist tomorrow.   2)  Let me know how appointment with neurology goes tomorrow.  Current Allergies (reviewed today): LIPITOR SIMVASTATIN (SIMVASTATIN)  Appended Document: ONE WEEK FOLLOW UP / LFW correction - dix hallpike not performed this visit - given pt's complaint of vertigo over weekend did not want to exacerbate again.

## 2010-05-29 NOTE — Assessment & Plan Note (Signed)
Summary: infection on face/tsc   Vital Signs:  Patient profile:   55 year old male Height:      70.5 inches Weight:      219 pounds BMI:     31.09 Temp:     97.5 degrees F oral Pulse rate:   68 / minute Pulse rhythm:   regular BP sitting:   110 / 60  (left arm) Cuff size:   regular  Vitals Entered By: Lowella Petties (August 22, 2008 3:33 PM) CC: Recheck face   History of Present Illness: spot on face is sore -- is a little better overall  less swollen - but had some drainage  not getting any bigger   did not take abx right away -- started later that night  felt poorly over weekend - but better yesterday   is tolerating abx      Allergies: 1)  Lipitor 2)  Simvastatin (Simvastatin)  Past History:  Past Medical History:    Coronary artery disease------------------------------------Dr Katrinka Blazing    Hypertension    Erectile dysfunction    Chronic sleep disorder    GERD    Hyperlipidemia    Colonic polyps, hx of--hyperplastic     (09/07/2007)  Past Surgical History:    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    1970's--Hemorrhoidectomy    1980's--Septo/rhinoplasty    7/07  Cath/ unstable angina (11/25/2006)  Family History:    Dad died @69  ??cause    Mom died @81  CVA and ?MI    2 brothers     4 sisters    1 brother died of heart trouble     No other CAD, HTN, DM    Some cancer--both maternal uncles died of some cancer    Not aware of colon or prostate cancer (03/15/2008)  Social History:    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 Eyes:  Denies eye irritation and red eye. ENT:  Denies nasal congestion and sinus pressure. CV:  Denies palpitations. Resp:  Denies cough and wheezing. GI:  Denies  nausea and vomiting. Derm:  Denies itching. Heme:  Denies abnormal bruising and bleeding.  Physical Exam  General:  Well-developed,well-nourished,in no acute distress; alert,appropriate and cooperative throughout examination Head:  normocephalic and atraumatic.   1 cm area of induration with scab R cheek- slt tender, not fluctuant but still firm  tiny amt of serous fluid expressed with pressure  area cleaned and dressed  Eyes:  vision grossly intact, pupils equal, pupils round, pupils reactive to light, and no injection.   Mouth:  pharynx pink and moist.   Neck:  No deformities, masses, or tenderness noted. Lungs:  Normal respiratory effort, chest expands symmetrically. Lungs are clear to auscultation, no crackles or wheezes. Heart:  Normal rate and regular rhythm. S1 and S2 normal without gallop, murmur, click, rub or other extra sounds. Skin:  see exam face Cervical Nodes:  No lymphadenopathy noted Psych:  normal affect, talkative and pleasant    Impression & Recommendations:  Problem # 1:  CELLULITIS, FACE, RIGHT (ICD-682.0) overall unchanged from prev exam- but not slt drainage (more this am per pt) wound cx obtained  continue bactrim and add augmentin  continue coverage and bactroban adv if worse- call asap or go to ER- esp if inc redness or any streaking or fever  f/u later this week- friday His updated medication list for this problem includes:    Bactrim Ds 800-160 Mg Tabs (Sulfamethoxazole-trimethoprim) .Marland Kitchen... 1 by mouth two times a day for 10 days    Augmentin 875-125 Mg Tabs (Amoxicillin-pot clavulanate) .Marland Kitchen... 1 by mouth two times a day for 10 days  Orders: T-Culture, Wound ([id number])  Complete Medication List: 1)  Metoprolol Tartrate 50 Mg Tabs (Metoprolol tartrate) .... Take 1 tablet by mouth once a day 2)  Adult Aspirin Low Strength 81 Mg Tbdp (Aspirin) .... Take 1 tablet by mouth once a day 3)  Multivitamins Tabs (Multiple vitamin) .... Take 1 tablet by  mouth once a day 4)  Equate Sleep Aid  .... One by mouth at bedtime as needed 5)  Ketoconazole 2 % Crea (Ketoconazole) .... Apply to rash two times a day till clear 6)  Mycostatin 100000 Unit/gm Powd (Nystatin) .... Apply to groin rash three times a day till clear 7)  Trazodone Hcl 100 Mg Tabs (Trazodone hcl) .Marland Kitchen.. 1-2 at bedtime to help sleep 8)  Prilosec 20 Mg Cpdr (Omeprazole) .... Take 1 tablet by mouth once a day as needed for heartburn 9)  Fluticasone Propionate 50 Mcg/act Susp (Fluticasone propionate) .... 2 sprays per nostril daily 10)  Bactrim Ds 800-160 Mg Tabs (Sulfamethoxazole-trimethoprim) .Marland Kitchen.. 1 by mouth two times a day for 10 days 11)  Bactroban 2 % Oint (Mupirocin) .... Apply to affected area two times a day 12)  Augmentin 875-125 Mg Tabs (Amoxicillin-pot clavulanate) .Marland Kitchen.. 1 by mouth two times a day for 10 days  Patient Instructions: 1)  continue your bactrim as directed  2)  start augmentin twice daily as directed  3)  continue using bactroban ointment - keep area as clean as possible 4)  if fever returns- please let me know  5)  update me asap or or go ER if increase in redness / swelling or pain  6)  follow up for re check on friday  Prescriptions: AUGMENTIN 875-125 MG TABS (AMOXICILLIN-POT CLAVULANATE) 1 by mouth two times a day for 10 days  #20 x 0   Entered and Authorized by:   Judith Part MD   Signed by:   Judith Part MD on 08/22/2008   Method used:   Print then Give to Patient   RxID:   [id number]       Prior Medications (reviewed today): METOPROLOL TARTRATE 50 MG  TABS (METOPROLOL TARTRATE) Take 1 tablet by mouth once a day ADULT ASPIRIN LOW STRENGTH 81 MG  TBDP (ASPIRIN) Take 1 tablet by mouth once a day MULTIVITAMINS   TABS (MULTIPLE VITAMIN) Take 1 tablet by mouth once a day EQUATE SLEEP AID () One by mouth at bedtime as needed KETOCONAZOLE 2 %  CREA (KETOCONAZOLE) apply to rash two times a day till clear MYCOSTATIN 100000 UNIT/GM  POWD  (NYSTATIN) apply to groin rash three times a day till clear TRAZODONE HCL 100 MG  TABS (TRAZODONE HCL) 1-2 at bedtime to help sleep PRILOSEC 20 MG  CPDR (OMEPRAZOLE) Take 1 tablet by mouth once a day as needed for heartburn FLUTICASONE PROPIONATE 50 MCG/ACT SUSP (FLUTICASONE PROPIONATE) 2 sprays per nostril daily BACTRIM DS 800-160 MG TABS (SULFAMETHOXAZOLE-TRIMETHOPRIM) 1 by mouth two times a day for 10 days BACTROBAN 2 % OINT (MUPIROCIN) apply to affected area two times a day Current Allergies:  LIPITOR SIMVASTATIN (SIMVASTATIN)

## 2010-05-29 NOTE — Assessment & Plan Note (Signed)
Summary: 5 M F/U DLO   Vital Signs:  Patient profile:   55 year old male Weight:      234 pounds BMI:     33.22 Temp:     98.1 degrees F oral Pulse rate:   60 / minute Pulse rhythm:   regular BP sitting:   122 / 62  (left arm) Cuff size:   large  Vitals Entered By: Mervin Hack CMA Duncan Dull) (December 15, 2009 2:11 PM) CC: 6 month follow-up   History of Present Illness: Having trouble with his hands Knuckles swell and hurt started about 2 months ago Uses tools like drill at times Ankles seem to swell as well--more on left Feels "tight" No meds for this other than aleve---this does help Doesn't add salt  Having cramping in his legs Right > left Notes in car and lying down at night  Having sig probelms with erections Goes back for 3-4 months Able to penetrate but then loses the erection Cialis didn't really help  No heart problems  Allergies: 1)  Lipitor 2)  Simvastatin (Simvastatin)  Past History:  Past medical, surgical, family and social histories (including risk factors) reviewed for relevance to current acute and chronic problems.  Past Medical History: Reviewed history from 12/02/2008 and no changes required. Coronary artery disease------------------------------------Dr Katrinka Blazing Hypertension Erectile dysfunction Chronic sleep disorder GERD Hyperlipidemia Colonic polyps, hx of--hyperplastic Impaired fasting glucose  Past Surgical History: Reviewed history from 11/25/2006 and no changes required. 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 Dad died @69  ??cause Mom died @81  CVA and ?MI 2 brothers  4 sisters 1 brother died of heart trouble  No other CAD, HTN, DM Some cancer--both maternal uncles died of some cancer Not aware of colon or prostate cancer  Social History: Reviewed history from 11/25/2006 and no  changes required. Occupation: Installs signs (like for businesses) Married-2nd     2 children from 1st marriage, 2 children with current wife Former Smoker--quit 7/07 Alcohol use-rare  Review of Systems       Has good nights and bad nights. Uses trazodone occ weight is down a few pounds  Physical Exam  General:  alert and normal appearance.   Neck:  supple, no masses, no thyromegaly, no carotid bruits, and no cervical lymphadenopathy.   Lungs:  normal respiratory effort, no intercostal retractions, no accessory muscle use, and normal breath sounds.   Heart:  normal rate, regular rhythm, no murmur, and no gallop.   Abdomen:  soft and non-tender.   Msk:  mild tenderness in MCP and PIP  joints in hands ??slight synovitis in PIPs  Knees and other joints okay Extremities:  no edema but calves are slightly tight Psych:  normally interactive, good eye contact, not anxious appearing, and not depressed appearing.     Impression & Recommendations:  Problem # 1:  ARTHRALGIA (ICD-719.40) Assessment New  ?? 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 Laboratory test [phone])  His updated medication list for this problem includes:    Viagra 100 Mg Tabs (Sildenafil citrate) .Marland Kitchen... 1/2 - 1 tab about 30-60 minutes before sex  Problem # 3:  HYPERTENSION (ICD-401.9) Assessment: Unchanged  good control  His updated medication list for this problem includes:    Metoprolol Tartrate 50 Mg Tabs (Metoprolol tartrate) .Marland Kitchen... Take 1 tablet by mouth once a day  BP today: 122/62 Prior BP: 110/70 (02/04/2009)  Labs Reviewed: K+: 4.2 (12/02/2008) Creat: : 1.0 (12/02/2008)   Chol: 232 (12/02/2008)    HDL: 44.40 (12/02/2008)   LDL: DEL (03/15/2008)   TG: 157.0 (12/02/2008)  Orders: T-Comprehensive Metabolic Panel ([id number]) T-CBC w/Diff ([id number]) T-TSH (41324-40102) Venipuncture (72536) Specimen Handling (64403)  Complete Medication List: 1)  Prilosec 20 Mg Cpdr (Omeprazole) .... Take 1 tablet by mouth once a day as needed for heartburn 2)  Metoprolol Tartrate 50 Mg Tabs (Metoprolol tartrate) .... Take 1 tablet by mouth once a day 3)  Trazodone Hcl 100 Mg Tabs (Trazodone hcl) .Marland Kitchen.. 1-2 at bedtime to help sleep 4)  Fluticasone Propionate 50 Mcg/act Susp (Fluticasone propionate) .... 2 sprays per nostril daily 5)  Adult Aspirin Low Strength 81 Mg Tbdp (Aspirin) .... Take 1 tablet by mouth once a day 6)  Multivitamins Tabs (Multiple vitamin) .... Take 1 tablet by mouth once a day 7)  Viagra 100 Mg Tabs (Sildenafil citrate) .... 1/2 - 1 tab about 30-60 minutes before sex  Patient Instructions: 1)  Please schedule a follow-up appointment in 6 months for physical Prescriptions: VIAGRA 100 MG TABS (SILDENAFIL CITRATE) 1/2 - 1 tab about 30-60 minutes before sex  #6 x 3   Entered and Authorized by:   Cindee Salt MD   Signed by:   Cindee Salt MD on 12/15/2009   Method used:   Electronically to        [facility]* (retail)       [address]       [place], [place]  [address]       Ph: [phone]       Fax: [phone]   RxID:   [id number] METOPROLOL TARTRATE 50 MG  TABS (METOPROLOL TARTRATE) Take 1 tablet by mouth once a day  #30 x 12   Entered by:   Mervin Hack CMA (AAMA)   Authorized by:   Cindee Salt MD   Signed by:   Mervin Hack CMA (AAMA) on 12/15/2009   Method used:   Electronically to        [facility]* (retail)       [address]       [place], [place]  [address]       Ph: [phone]       Fax: [phone]   RxID:   [id number]   Current Allergies (reviewed today): LIPITOR SIMVASTATIN (SIMVASTATIN)

## 2010-05-29 NOTE — Assessment & Plan Note (Signed)
Summary: COUGH,ST,HA/CLE   Vital Signs:  Patient Profile:   55 Years Old Male Height:     70.5 inches Weight:      223.25 pounds Temp:     97.8 degrees F oral Pulse rate:   92 / minute Pulse rhythm:   regular BP sitting:   130 / 86  (left arm) Cuff size:   large  Vitals Entered By: Delilah Shan (February 29, 2008 9:35 AM)                 Chief Complaint:  Cough, ST, HA, and anxiety from Mother passing away last night..  Acute Visit History:      The patient complains of cough, headache, nasal discharge, sinus problems, and sore throat.  These symptoms began 1 week ago.  He denies earache and fever.  Other comments include: cough gettinfg worse using mucinex, ibuprofen not sleeping well, mother passed away last night, waking up at 4 AM using OTC benadryl, trazodone dosen't help much.        The cough interferes with his sleep.  The character of the cough is described as nonproductive.  There is no history of shortness of breath associated with his cough.        He complains of sinus pressure, ears being blocked, nasal congestion, and purulent drainage.           Current Allergies (reviewed today): LIPITOR SIMVASTATIN (SIMVASTATIN)  Past Medical History:    Reviewed history from 09/07/2007 and no changes required:       Coronary artery disease------------------------------------Dr Katrinka Blazing       Hypertension       Erectile dysfunction       Chronic sleep disorder       GERD       Hyperlipidemia       Colonic polyps, hx of--hyperplastic   Social History:    Reviewed history from 11/25/2006 and no changes required:       Occupation: Installs signs (like for businesses)       Married-2nd           2 children from 1st marriage, 2 children with current wife       Former Smoker--quit 7/07       Alcohol use-rare    Review of Systems      See HPI   Physical Exam  General:     Well-developed,well-nourished,in no acute distress; alert,appropriate and cooperative  throughout examination Head:     ttp right max sinus Ears:     External ear exam shows no significant lesions or deformities.  Otoscopic examination reveals clear canals, tympanic membranes are intact bilaterally without bulging, retraction, inflammation or discharge. Hearing is grossly normal bilaterally. Nose:     copius yellow white nasal discharge Mouth:     Oral mucosa and oropharynx without lesions or exudates.  Teeth in good repair. MMM Neck:     no cervical or supraclavicular lymphadenopathy  Lungs:     Normal respiratory effort, chest expands symmetrically. Lungs are clear to auscultation, no crackles or wheezes. Heart:     Normal rate and regular rhythm. S1 and S2 normal without gallop, murmur, click, rub or other extra sounds.    Impression & Recommendations:  Problem # 1:  SINUSITIS- ACUTE-NOS (ICD-461.9) Guafenesin and nasal saline. Treat with antibiotic to vcover bacteria infection. Cough suppressant for PND cough and for sleep. Wife has some left over from her recent pneumonia.  I don't recommend using additional sleep  medication at the same time.  His updated medication list for this problem includes:    Amoxicillin 500 Mg Cap (Amoxicillin) .Marland Kitchen... Take 2 capsule by mouth two  times a day x 10 days    Guaifenesin 400 Mg Tabs (Guaifenesin) .Marland Kitchen... 1 tab by mouth q4-6 hours as needed congestion, cough   Complete Medication List: 1)  Metoprolol Tartrate 50 Mg Tabs (Metoprolol tartrate) .... Take 1 tablet by mouth once a day 2)  Adult Aspirin Low Strength 81 Mg Tbdp (Aspirin) .... Take 1 tablet by mouth once a day 3)  Multivitamins Tabs (Multiple vitamin) .... Take 1 tablet by mouth once a day 4)  Equate Sleep Aid  .... Hs 5)  Ketoconazole 2 % Crea (Ketoconazole) .... Apply to rash two times a day till clear 6)  Mycostatin 100000 Unit/gm Powd (Nystatin) .... Apply to groin rash three times a day till clear 7)  Trazodone Hcl 100 Mg Tabs (Trazodone hcl) .Marland Kitchen.. 1-2 at bedtime to  help sleep 8)  Prilosec 20 Mg Cpdr (Omeprazole) .... Take 1 tablet by mouth once a day 9)  Amoxicillin 500 Mg Cap (Amoxicillin) .... Take 2 capsule by mouth two  times a day x 10 days 10)  Guaifenesin 400 Mg Tabs (Guaifenesin) .Marland Kitchen.. 1 tab by mouth q4-6 hours as needed congestion, cough    Prescriptions: GUAIFENESIN 400 MG TABS (GUAIFENESIN) 1 tab by mouth q4-6 hours as needed congestion, cough  #30 x 0   Entered and Authorized by:   Kerby Nora MD   Signed by:   Kerby Nora MD on 02/29/2008   Method used:   Electronically to        [facility]* (retail)       [address]       [place], [place]  [address]       Ph: [phone]       Fax: [phone]   RxID:   [id number] AMOXICILLIN 500 MG CAP (AMOXICILLIN) Take 2 capsule by mouth two  times a day X 10 days  #40 x 0   Entered and Authorized by:   Kerby Nora MD   Signed by:   Kerby Nora MD on 02/29/2008   Method used:   Electronically to        [facility]* (retail)       [address]       [place], [place]  [address]       Ph: [phone]       Fax: [phone]   RxID:   [id number]  ] Current Allergies (reviewed today): LIPITOR SIMVASTATIN (SIMVASTATIN) Current Medications (including changes made in today's visit):  METOPROLOL TARTRATE 50 MG  TABS (METOPROLOL TARTRATE) Take 1 tablet by mouth once a day ADULT ASPIRIN LOW STRENGTH 81 MG  TBDP (ASPIRIN) Take 1 tablet by mouth once a day MULTIVITAMINS   TABS (MULTIPLE VITAMIN) Take 1 tablet by mouth once a day * EQUATE SLEEP AID hs KETOCONAZOLE 2 %  CREA (KETOCONAZOLE) apply to rash two times a day till clear MYCOSTATIN 100000 UNIT/GM  POWD (NYSTATIN) apply to groin rash three times a day till clear TRAZODONE HCL 100 MG  TABS (TRAZODONE HCL) 1-2 at bedtime to help sleep PRILOSEC 20 MG  CPDR (OMEPRAZOLE) Take 1 tablet by mouth once a day AMOXICILLIN 500 MG CAP (AMOXICILLIN) Take 2 capsule by mouth two  times a day X 10 days GUAIFENESIN 400 MG TABS (GUAIFENESIN)  1 tab by mouth q4-6 hours as needed congestion, cough

## 2010-05-29 NOTE — Assessment & Plan Note (Signed)
Summary: SORE THROAT/SCM   Vital Signs:  Patient Profile:   55 Years Old Male Height:     70.5 inches Weight:      221 pounds BMI:     31.38 Temp:     96.9 degrees F oral Pulse rate:   60 / minute Pulse rhythm:   regular Resp:     20 per minute BP sitting:   122 / 80  (left arm) Cuff size:   large  Vitals Entered By: Shonna Chock (May 14, 2008 11:11 AM)                 [facility] Type:  [facility] PCP:  Cindee Salt MD  Chief Complaint:  SORE THROAT, CHILLS AND CONGESTION, and URI symptoms.  History of Present Illness: 2 week ago the patient had cold symptoms that had nearly resolved.  Then this am,  he woke up with a very sore throat and saw some erythema/swelling of the uvula.   URI Symptoms      This is a 55 year old man who presents with URI symptoms.  The patient reports sore throat and dry cough, but denies nasal congestion, purulent nasal discharge, productive cough, and earache.  The patient denies fever, stiff neck, dyspnea, wheezing, rash, vomiting, and diarrhea.  The patient also reports itchy watery eyes, itchy throat, sneezing, seasonal symptoms, and some fatigue.  The patient denies headache and muscle aches.    Has a history of seasonal allergy type symptoms and has noted increased PND.  Not taking anything for allergy symptoms or PND.  patient is a mouth breather with a history of sleep apnea.  He noted a very dry mouth this am.    Updated Prior Medication List: METOPROLOL TARTRATE 50 MG  TABS (METOPROLOL TARTRATE) Take 1 tablet by mouth once a day ADULT ASPIRIN LOW STRENGTH 81 MG  TBDP (ASPIRIN) Take 1 tablet by mouth once a day MULTIVITAMINS   TABS (MULTIPLE VITAMIN) Take 1 tablet by mouth once a day * EQUATE SLEEP AID One by mouth at bedtime as needed KETOCONAZOLE 2 %  CREA (KETOCONAZOLE) apply to rash two times a day till clear MYCOSTATIN 100000 UNIT/GM  POWD (NYSTATIN) apply to groin rash three times a day till clear TRAZODONE HCL 100 MG   TABS (TRAZODONE HCL) 1-2 at bedtime to help sleep PRILOSEC 20 MG  CPDR (OMEPRAZOLE) Take 1 tablet by mouth once a day as needed for heartburn  Current Allergies (reviewed today): LIPITOR SIMVASTATIN (SIMVASTATIN)  Past Medical History:    Reviewed history from 09/07/2007 and no changes required:       Coronary artery disease------------------------------------Dr Katrinka Blazing       Hypertension       Erectile dysfunction       Chronic sleep disorder       GERD       Hyperlipidemia       Colonic polyps, hx of--hyperplastic  Past Surgical History:    Reviewed history from 11/25/2006 and no changes required:       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       1970's--Hemorrhoidectomy       1980's--Septo/rhinoplasty       7/07  Cath/ unstable angina     Review of Systems       see HPI   Physical Exam  General:     NADalert, well-developed, well-nourished, well-hydrated, appropriate dress, normal appearance, cooperative to examination, and good hygiene.   Head:     normocephalic,  atraumatic, and no abnormalities observed.   Eyes:     no conjuntival injection noted  Ears:     External ear exam shows no significant lesions or deformities.  Otoscopic examination reveals clear canals, tympanic membranes are intact bilaterally without bulging, retraction, inflammation or discharge. Hearing is grossly normal bilaterally. Nose:     External nasal examination shows no deformity or inflammation.  Mouth:     Oral mucosa and oropharynx without lesions or exudates.  the uvula is slightly erythematous and swollen and there is mild posterior pharynx erythema with significant PND present Neck:     supple and full ROM.  no LAN Lungs:     Normal respiratory effort, chest expands symmetrically. Lungs are clear to auscultation, no crackles or wheezes. Heart:     Normal rate and regular rhythm. S1 and S2 normal without gallop, murmur, click, rub or other extra sounds. Extremities:     no cyanosis, clubbing or  edema with grossly normal ROM Neurologic:     alert & oriented X3 and gait normal.   Skin:     no rashes and no suspicious lesions.   Psych:     normally interactive, good eye contact, not anxious appearing, and not depressed appearing.      Impression & Recommendations:  Problem # 1:  PHARYNGITIS (ICD-462) Strep negative.  Appears to be viral in nature.  Get plenty of rest, drink lots of clear liquids, and use Tylenol or Ibuprofen for fever and comfort. If sinus symptoms, then use warm moist compresses, and over the counter decongestants (only as directed). Use warm salt water rinses for sore throat comfort.  Call if no improvement in 5-7 days, sooner if increasing pain, fever, or new symptoms.  Patient is aware that I am on call this weekend, he can call if he has any concerning symptoms or questions.  Patient has used claritin in the past for seasonal allergies and will try that for help with PND.  Complete Medication List: 1)  Metoprolol Tartrate 50 Mg Tabs (Metoprolol tartrate) .... Take 1 tablet by mouth once a day 2)  Adult Aspirin Low Strength 81 Mg Tbdp (Aspirin) .... Take 1 tablet by mouth once a day 3)  Multivitamins Tabs (Multiple vitamin) .... Take 1 tablet by mouth once a day 4)  Equate Sleep Aid  .... One by mouth at bedtime as needed 5)  Ketoconazole 2 % Crea (Ketoconazole) .... Apply to rash two times a day till clear 6)  Mycostatin 100000 Unit/gm Powd (Nystatin) .... Apply to groin rash three times a day till clear 7)  Trazodone Hcl 100 Mg Tabs (Trazodone hcl) .Marland Kitchen.. 1-2 at bedtime to help sleep 8)  Prilosec 20 Mg Cpdr (Omeprazole) .... Take 1 tablet by mouth once a day as needed for heartburn  Other Orders: Rapid Strep ([address])     Laboratory Results    Other Tests  Rapid Strep: negative

## 2010-05-29 NOTE — Progress Notes (Signed)
Summary: refill request fro trazadone  Phone Note Refill Request Message from:  Fax from Pharmacy  Refills Requested: Medication #1:  TRAZODONE HCL 100 MG  TABS 1-2 at bedtime to help sleep Faxed request from [facility], form is on your desk  Initial call taken by: Lowella Petties,  March 29, 2008 10:44 AM  Follow-up for Phone Call        rx done electronically Follow-up by: Cindee Salt MD,  March 29, 2008 1:27 PM    New/Updated Medications: TRAZODONE HCL 100 MG  TABS (TRAZODONE HCL) 1-2 at bedtime to help sleep   Prescriptions: TRAZODONE HCL 100 MG  TABS (TRAZODONE HCL) 1-2 at bedtime to help sleep  #60 x 12   Entered and Authorized by:   Cindee Salt MD   Signed by:   Cindee Salt MD on 03/29/2008   Method used:   Electronically to        [facility]* (retail)       [address]       [place], [place]  [address]       Ph: [phone]       Fax: [phone]   RxID:   [id number]

## 2010-05-31 NOTE — Progress Notes (Signed)
Summary: trazodone  Phone Note Refill Request Message from:  Fax from Pharmacy on May 23, 2010 2:00 PM  Refills Requested: Medication #1:  TRAZODONE HCL 100 MG  TABS 1-2 at bedtime to help sleep   Last Refilled: 11/30/2009 Refill request from [facility]. [phone]. Fax is on your desk.   Initial call taken by: Melody Comas,  May 23, 2010 2:13 PM  Follow-up for Phone Call        Rx completed in Dr. Tiajuana Amass Follow-up by: Cindee Salt MD,  May 23, 2010 2:17 PM    New/Updated Medications: TRAZODONE HCL 100 MG  TABS (TRAZODONE HCL) 1-2 at bedtime to help sleep Prescriptions: TRAZODONE HCL 100 MG  TABS (TRAZODONE HCL) 1-2 at bedtime to help sleep  #60 x 11   Entered and Authorized by:   Cindee Salt MD   Signed by:   Cindee Salt MD on 05/23/2010   Method used:   Electronically to        [facility]* (retail)       [address]       [place], [place]  [address]       Ph: [phone]       Fax: [phone]   RxID:   [id number]

## 2010-05-31 NOTE — Progress Notes (Signed)
Summary: refill request for nitrostat  Phone Note Refill Request Message from:  Patient  Refills Requested: Medication #1:  nitrostat   Last Refilled: 03/07/2008 Faxed request from [facility] is on your desk.  This is no longer on med list.  Initial call taken by: Lowella Petties CMA, AAMA,  April 19, 2010 3:21 PM  Follow-up for Phone Call        Please check with patient Has history of coronary disease but no record of using nitro can fill #25 x 0 if he uses it or likes to at least keep a fresh bottle available Follow-up by: Cindee Salt MD,  April 19, 2010 5:31 PM  Additional Follow-up for Phone Call Additional follow up Details #1::        Rx faxed to pharmacy, confirmed with patient  Additional Follow-up by: DeShannon Katrinka Blazing CMA Duncan Dull),  April 20, 2010 9:40 AM    New/Updated Medications: NITROSTAT 0.4 MG SUBL (NITROGLYCERIN) dissolve 1 tab under tongue  every 5 minutes for maximum of 3 doses in 15 minutes Prescriptions: NITROSTAT 0.4 MG SUBL (NITROGLYCERIN) dissolve 1 tab under tongue  every 5 minutes for maximum of 3 doses in 15 minutes  #25 x 0   Entered by:   Mervin Hack CMA (AAMA)   Authorized by:   Cindee Salt MD   Signed by:   Mervin Hack CMA (AAMA) on 04/20/2010   Method used:   Electronically to        [facility]* (retail)       [address]       [place], [place]  [address]       Ph: [phone]       Fax: [phone]   RxID:   [id number]

## 2010-06-20 ENCOUNTER — Other Ambulatory Visit: Payer: Self-pay | Admitting: Internal Medicine

## 2010-06-20 ENCOUNTER — Encounter: Payer: Self-pay | Admitting: Internal Medicine

## 2010-06-20 ENCOUNTER — Encounter (INDEPENDENT_AMBULATORY_CARE_PROVIDER_SITE_OTHER): Payer: PRIVATE HEALTH INSURANCE | Admitting: Internal Medicine

## 2010-06-20 DIAGNOSIS — I10 Essential (primary) hypertension: Secondary | ICD-10-CM

## 2010-06-20 DIAGNOSIS — E785 Hyperlipidemia, unspecified: Secondary | ICD-10-CM

## 2010-06-20 DIAGNOSIS — Z0000 Encounter for general adult medical examination without abnormal findings: Secondary | ICD-10-CM

## 2010-06-20 LAB — CBC WITH DIFFERENTIAL/PLATELET
Basophils Absolute: 0.1 10*3/uL (ref 0.0–0.1)
Basophils Relative: 0.9 % (ref 0.0–3.0)
Eosinophils Absolute: 0.8 10*3/uL — ABNORMAL HIGH (ref 0.0–0.7)
Eosinophils Relative: 8.8 % — ABNORMAL HIGH (ref 0.0–5.0)
HCT: 45.8 % (ref 39.0–52.0)
Hemoglobin: 15.4 g/dL (ref 13.0–17.0)
Lymphocytes Relative: 37.4 % (ref 12.0–46.0)
Lymphs Abs: 3.5 10*3/uL (ref 0.7–4.0)
MCHC: 33.5 g/dL (ref 30.0–36.0)
MCV: 91.1 fl (ref 78.0–100.0)
Monocytes Absolute: 0.9 10*3/uL (ref 0.1–1.0)
Monocytes Relative: 9.8 % (ref 3.0–12.0)
Neutro Abs: 4 10*3/uL (ref 1.4–7.7)
Neutrophils Relative %: 43.1 % (ref 43.0–77.0)
Platelets: 272 10*3/uL (ref 150.0–400.0)
RBC: 5.03 Mil/uL (ref 4.22–5.81)
RDW: 13.6 % (ref 11.5–14.6)
WBC: 9.3 10*3/uL (ref 4.5–10.5)

## 2010-06-20 LAB — RENAL FUNCTION PANEL
Albumin: 3.9 g/dL (ref 3.5–5.2)
BUN: 13 mg/dL (ref 6–23)
CO2: 29 mEq/L (ref 19–32)
Calcium: 9.7 mg/dL (ref 8.4–10.5)
Chloride: 106 mEq/L (ref 96–112)
Creatinine, Ser: 0.9 mg/dL (ref 0.4–1.5)
GFR: 88.48 mL/min (ref >60.00)
Glucose, Bld: 88 mg/dL (ref 70–99)
Phosphorus: 2.2 mg/dL — ABNORMAL LOW (ref 2.3–4.6)
Potassium: 4.4 mEq/L (ref 3.5–5.1)
Sodium: 142 mEq/L (ref 135–145)

## 2010-06-20 LAB — LIPID PANEL
Cholesterol: 219 mg/dL — ABNORMAL HIGH (ref 0–200)
HDL: 41.5 mg/dL (ref >39.00)
Total CHOL/HDL Ratio: 5
Triglycerides: 182 mg/dL — ABNORMAL HIGH (ref 0.0–149.0)
VLDL: 36.4 mg/dL (ref 0.0–40.0)

## 2010-06-20 LAB — TSH: TSH: 0.78 u[IU]/mL (ref 0.35–5.50)

## 2010-06-20 LAB — HEPATIC FUNCTION PANEL
ALT: 32 U/L (ref 0–53)
AST: 28 U/L (ref 0–37)
Albumin: 3.9 g/dL (ref 3.5–5.2)
Alkaline Phosphatase: 108 U/L (ref 39–117)
Bilirubin, Direct: 0.2 mg/dL (ref 0.0–0.3)
Total Bilirubin: 0.7 mg/dL (ref 0.3–1.2)
Total Protein: 6.9 g/dL (ref 6.0–8.3)

## 2010-06-20 LAB — LDL CHOLESTEROL, DIRECT: Direct LDL: 160.3 mg/dL

## 2010-06-20 LAB — PSA: PSA: 1.06 ng/mL (ref 0.10–4.00)

## 2010-06-26 NOTE — Assessment & Plan Note (Signed)
Summary: CPE/DLO   Vital Signs:  Patient profile:   55 year old male Height:      70.5 inches Weight:      245 pounds Temp:     97.8 degrees F oral Pulse rate:   58 / minute Pulse rhythm:   regular BP sitting:   116 / 67  (left arm) Cuff size:   large  Vitals Entered By: Mervin Hack CMA Duncan Dull) (June 20, 2010 8:40 AM) CC: adult physical   History of Present Illness: Better from fall Wrist is healed but some restriction of motion in first 2 fingers Back to work in December  Getting over recent cold Travelled to [place] for business and then got sick  Allergies: 1)  Lipitor 2)  Simvastatin (Simvastatin)  Past History:  Past medical, surgical, family and social histories (including risk factors) reviewed for relevance to current acute and chronic problems.  Past Medical History: Reviewed history from 12/02/2008 and no changes required. Coronary artery disease------------------------------------Dr Katrinka Blazing Hypertension Erectile dysfunction Chronic sleep disorder GERD Hyperlipidemia Colonic polyps, hx of--hyperplastic Impaired fasting glucose  Past Surgical History: Reviewed history from 02/05/2010 and no changes required. 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 Dad died @69  ??cause Mom died @81  CVA and ?MI 2 brothers  4 sisters 1 brother died of heart trouble  No other CAD, HTN, DM Some cancer--both maternal uncles died of some cancer Not aware of colon or prostate cancer  Social History: Reviewed history from 11/25/2006 and no changes required. Occupation: Counsellor signs (like for businesses) Married-2nd     2 children from 1st marriage, 2 children with current  wife Former Smoker--quit 7/07 Alcohol use-rare  Review of Systems General:  weight is up 9#--relates to being sedentary sleeps okay wears seat belt. Eyes:  Denies double vision and vision loss-1 eye. ENT:  Denies decreased hearing and ringing in ears; teeth okay---regular dental visits. CV:  Complains of shortness of breath with exertion; denies chest pain or discomfort, difficulty breathing at night, difficulty breathing while lying down, fainting, and palpitations; Notes some easier DOE with weight gain. Resp:  Denies cough and shortness of breath. GI:  Complains of indigestion; denies abdominal pain, bloody stools, change in bowel habits, dark tarry stools, nausea, and vomiting; occ heartburn if he isn't careful with diet--controlled with med. GU:  Complains of erectile dysfunction and nocturia; denies urinary frequency and urinary hesitancy; nocturia is rare Viagra helps ED. MS:  Denies joint pain and joint swelling. Derm:  Complains of rash; denies lesion(s); Occ little bumps which come and go. Neuro:  Denies headaches, numbness, and tingling. Psych:  Denies anxiety and depression. Heme:  Denies abnormal bruising and enlarge lymph nodes. Allergy:  Complains of seasonal allergies and sneezing; does okay with OTC med.  Physical Exam  General:  alert and normal appearance.   Eyes:  pupils equal, pupils round, pupils reactive to light, and no optic disk abnormalities.   Ears:  R ear normal and L ear normal.   Mouth:  no erythema, no exudates, and no lesions.   Neck:  supple, no masses, no thyromegaly, no carotid bruits, and no cervical lymphadenopathy.   Lungs:  normal respiratory effort, no intercostal retractions, no accessory muscle use, and normal breath sounds.   Heart:  normal rate, regular  rhythm, no murmur, and no gallop.   Abdomen:  soft, non-tender, and no masses.   Prostate:  deferred after discussion Msk:  no joint tenderness and no joint swelling.   Pulses:  1+ in  feet Extremities:  no edema Neurologic:  alert & oriented X3, strength normal in all extremities, and gait normal.   Skin:  no rashes and no suspicious lesions.   Axillary Nodes:  No palpable lymphadenopathy Psych:  normally interactive, good eye contact, not anxious appearing, and not depressed appearing.     Impression & Recommendations:  Problem # 1:  PREVENTIVE HEALTH CARE (ICD-V70.0) Assessment Comment Only  healthy but poor conditioning discussed fitness will check PSA after discussion discussed giving up sugared sodas!!!  Orders: TLB-PSA (Prostate Specific Antigen) (84153-PSA)  Problem # 2:  HYPERTENSION (ICD-401.9) Assessment: Unchanged  good control no changes needed  His updated medication list for this problem includes:    Metoprolol Tartrate 50 Mg Tabs (Metoprolol tartrate) .Marland Kitchen... Take 1 tablet by mouth once a day  BP today: 116/67 Prior BP: 142/80 (02/05/2010)  Labs Reviewed: K+: 4.3 (12/15/2009) Creat: : 1.07 (12/15/2009)   Chol: 232 (12/02/2008)   HDL: 44.40 (12/02/2008)   LDL: DEL (03/15/2008)   TG: 157.0 (12/02/2008)  Orders: TLB-Renal Function Panel (80069-RENAL) TLB-CBC Platelet - w/Differential (85025-CBCD) TLB-TSH (Thyroid Stimulating Hormone) (84443-TSH) Venipuncture ([address])  Problem # 3:  GERD (ICD-530.81) Assessment: Unchanged okay with meds  His updated medication list for this problem includes:    Prilosec 20 Mg Cpdr (Omeprazole) .Marland Kitchen... Take 1 tablet by mouth once a day as needed for heartburn  Problem # 4:  HYPERLIPIDEMIA (ICD-272.4)  myalgias on statin still on fish oil  Labs Reviewed: SGOT: 19 (12/15/2009)   SGPT: 22 (12/15/2009)   HDL:44.40 (12/02/2008), 47.8 (03/15/2008)  LDL:DEL (03/15/2008), DEL (03/05/2007)  Chol:232 (12/02/2008), 221 (03/15/2008)  Trig:157.0 (12/02/2008), 112 (03/15/2008)  Orders: TLB-Lipid Panel (80061-LIPID) TLB-Hepatic/Liver Function Pnl (80076-HEPATIC)  Problem # 5:  CORONARY ARTERY DISEASE  (ICD-414.00) Assessment: Unchanged no symptoms sees Dr Katrinka Blazing occ  His updated medication list for this problem includes:    Metoprolol Tartrate 50 Mg Tabs (Metoprolol tartrate) .Marland Kitchen... Take 1 tablet by mouth once a day    Nitrostat 0.4 Mg Subl (Nitroglycerin) .Marland Kitchen... Dissolve 1 tab under tongue  every 5 minutes for maximum of 3 doses in 15 minutes    Adult Aspirin Low Strength 81 Mg Tbdp (Aspirin) .Marland Kitchen... Take 1 tablet by mouth once a day  Complete Medication List: 1)  Prilosec 20 Mg Cpdr (Omeprazole) .... Take 1 tablet by mouth once a day as needed for heartburn 2)  Metoprolol Tartrate 50 Mg Tabs (Metoprolol tartrate) .... Take 1 tablet by mouth once a day 3)  Trazodone Hcl 100 Mg Tabs (Trazodone hcl) .Marland Kitchen.. 1-2 at bedtime to help sleep 4)  Fluticasone Propionate 50 Mcg/act Susp (Fluticasone propionate) .... 2 sprays per nostril daily 5)  Viagra 100 Mg Tabs (Sildenafil citrate) .... 1/2 - 1 tab about 30-60 minutes before sex 6)  Nitrostat 0.4 Mg Subl (Nitroglycerin) .... Dissolve 1 tab under tongue  every 5 minutes for maximum of 3 doses in 15 minutes 7)  Adult Aspirin Low Strength 81 Mg Tbdp (Aspirin) .... Take 1 tablet by mouth once a day 8)  Multivitamins Tabs (Multiple vitamin) .... Take 1 tablet by mouth once a day  Patient Instructions: 1)  Please schedule a follow-up appointment in 6 months .    Orders Added: 1)  Est. Patient 40-64 years [99396] 2)  TLB-Renal  Function Panel [80069-RENAL] 3)  TLB-CBC Platelet - w/Differential [85025-CBCD] 4)  TLB-TSH (Thyroid Stimulating Hormone) [84443-TSH] 5)  Venipuncture [36415] 6)  TLB-Lipid Panel [80061-LIPID] 7)  TLB-Hepatic/Liver Function Pnl [80076-HEPATIC] 8)  TLB-PSA (Prostate Specific Antigen) [[address]-UEA]    Current Allergies (reviewed today): LIPITOR SIMVASTATIN (SIMVASTATIN)

## 2010-07-23 ENCOUNTER — Other Ambulatory Visit: Payer: Self-pay | Admitting: *Deleted

## 2010-07-23 MED ORDER — OMEPRAZOLE 20 MG PO CPDR: 20.0000 mg | DELAYED_RELEASE_CAPSULE | Freq: Every day | ORAL | Status: DC

## 2010-08-05 ENCOUNTER — Inpatient Hospital Stay (INDEPENDENT_AMBULATORY_CARE_PROVIDER_SITE_OTHER)
Admission: RE | Admit: 2010-08-05 | Discharge: 2010-08-05 | Disposition: A | Discharge status: Home / Self Care | Payer: Worker's Compensation | Source: Ambulatory Visit | Attending: Emergency Medicine | Admitting: Emergency Medicine

## 2010-08-05 ENCOUNTER — Inpatient Hospital Stay (HOSPITAL_COMMUNITY)
Admission: EM | Admit: 2010-08-05 | Discharge: 2010-08-08 | DRG: 513 | Disposition: A | Discharge status: Home / Self Care | Payer: Worker's Compensation | Attending: Orthopedic Surgery | Admitting: Orthopedic Surgery

## 2010-08-05 DIAGNOSIS — S61209A Unspecified open wound of unspecified finger without damage to nail, initial encounter: Secondary | ICD-10-CM | POA: Diagnosis present

## 2010-08-05 DIAGNOSIS — IMO0002 Reserved for concepts with insufficient information to code with codable children: Secondary | ICD-10-CM

## 2010-08-05 DIAGNOSIS — Z79899 Other long term (current) drug therapy: Secondary | ICD-10-CM

## 2010-08-05 DIAGNOSIS — I10 Essential (primary) hypertension: Secondary | ICD-10-CM | POA: Diagnosis present

## 2010-08-05 DIAGNOSIS — Y990 Civilian activity done for income or pay: Secondary | ICD-10-CM

## 2010-08-05 DIAGNOSIS — I2510 Atherosclerotic heart disease of native coronary artery without angina pectoris: Secondary | ICD-10-CM | POA: Diagnosis present

## 2010-08-05 DIAGNOSIS — Y9269 Other specified industrial and construction area as the place of occurrence of the external cause: Secondary | ICD-10-CM

## 2010-08-05 DIAGNOSIS — M6789 Other specified disorders of synovium and tendon, multiple sites: Principal | ICD-10-CM | POA: Diagnosis present

## 2010-08-05 DIAGNOSIS — W3189XA Contact with other specified machinery, initial encounter: Secondary | ICD-10-CM

## 2010-08-05 LAB — POCT I-STAT, CHEM 8
BUN: 20 mg/dL (ref 6–23)
Calcium, Ion: 1.06 mmol/L — ABNORMAL LOW (ref 1.12–1.32)
Chloride: 107 mEq/L (ref 96–112)
Creatinine, Ser: 1.2 mg/dL (ref 0.4–1.5)
Glucose, Bld: 114 mg/dL — ABNORMAL HIGH (ref 70–99)
HCT: 46 % (ref 39.0–52.0)
Hemoglobin: 15.6 g/dL (ref 13.0–17.0)
Potassium: 4.7 mEq/L (ref 3.5–5.1)
Sodium: 141 mEq/L (ref 135–145)
TCO2: 27 mmol/L (ref 0–100)

## 2010-08-21 NOTE — Op Note (Signed)
[name], [name]                 ACCOUNT NO.:  [id number]  MEDICAL RECORD NO.:  [id number]           PATIENT TYPE:  I  LOCATION:  5024                         FACILITY:  [facility]  PHYSICIAN:  Madelynn Done, MD  DATE OF BIRTH:  [birth year]  DATE OF PROCEDURE:  08/05/2010 DATE OF DISCHARGE:                              OPERATIVE REPORT   PREOPERATIVE DIAGNOSIS:  Left index finger high-pressure paint gun injury and possible flexor tendon sheath infection.  POSTOPERATIVE DIAGNOSIS:  Left index finger high-pressure paint gun injury and possible flexor tendon sheath infection.  ATTENDING PHYSICIAN:  Dr. Bradly Bienenstock was scrubbed and present for the entire procedure.  ASSISTANT SURGEON:  None.  ANESTHESIA:  General via endotracheal tube.  SURGICAL PROCEDURES: 1. Left index finger debridement of skin and subcutaneous tissue,     excisional debridement. 2. Left index finger flexor tendon sheath drainage.  INTRAOPERATIVE FINDINGS:  The patient did have the paint material over the distal pulp of the finger.  There was a small portion of the flexor sheath that appeared to have small remnants of the paint but going up all the way up to the A3 pulley.  There was no paint.  It did not appear to track along the flexor sheath all the way up to the A2 pulley.  SURGICAL INDICATIONS:  [name] is a 55 year old male who sustained a high-pressure paint gun injury 3 days prior to his presentation to urgent care.  He was sent home following the injury.  He presented back to the emergency department with worsening pain and redness.  The patient was seen and evaluated in the emergency department and recommended that he undergo the above procedure.  Risks, benefits, and alternatives were discussed in detail with the patient and a signed informed consent was obtained.  Risks include but not limited to bleeding, infection, damage to nearby nerves, arteries, or tendons, loss of motion of the wrist  and digits, persistent infection, and even loss of the finger.  DESCRIPTION OF PROCEDURE:  The patient was appropriately identified in the preop holding area and a mark with a permanent marker made on the left index finger to indicate correct operative site.  The patient was then brought back to the operating room, placed supine on the anesthesia room table, general anesthesia was administered.  The patient tolerated this well.  A well-padded tourniquet was inflated in the left brachium and sealed with a 1000 drape.  The left upper extremity was then prepped and draped in normal sterile fashion.  A time-out was called.  The correct side was identified and the procedure was then begun.  Attention was then turned to the left index finger.  A curvilinear incision was then made directly over the distal phalanx extending proximally to the level of the A2 pulley.  Dissection was then carried down through the skin and subcutaneous tissue.  Careful inspection of the flexor sheath was done throughout.  The patient did have a moderate amount of pain and debris over the distal tuft over the index finger.  Excisional debridement of skin and subcutaneous tissue was then carried  out all the way down to the A5 pulley.  The A3 pulley was then opened.  The flexor sheath was viciously inspected.  There did not appear to be significant injury or paint material within the flexor sheath.  After the flexor sheath was then opened and the wound drained, 1000 L of fluid was then run through the flexor sheath through the small pediatric feeding tube. Copious irrigation was done throughout.  After excisional debridement and drainage of the flexor sheath, the tourniquet had been deflated with good perfusion of the fingertip.  Following this, the wound was then loosely reapproximated, closed over drain with a simple nylon suture. Adaptic dressing, sterile compressive bandage was then applied.  The patient was then  placed in the finger splint.  He was then extubated and taken to recovery room in good condition.  POSTOPERATIVE PLAN:  The patient was admitted for IV antibiotics.  Plan to check the wound in 48 hours.  The patient may need repeat I and D depending on how he responds.  All questions were answered and encouraged at the end of the day.     Madelynn Done, MD     FWO/MEDQ  D:  08/06/2010  T:  08/07/2010  Job:  [id number]  Electronically Signed by Bradly Bienenstock IV MD on 08/21/2010 05:47:41 PM

## 2010-09-04 NOTE — Discharge Summary (Signed)
  NAMERANDALE, [name]                 ACCOUNT NO.:  [id number]  MEDICAL RECORD NO.:  [id number]           PATIENT TYPE:  I  LOCATION:  5024                         FACILITY:  [facility]  PHYSICIAN:  Madelynn Done, MD  DATE OF BIRTH:  [birth year]  DATE OF ADMISSION:  08/05/2010 DATE OF DISCHARGE:  08/08/2010                              DISCHARGE SUMMARY   ADMISSION DIAGNOSIS:  Left index finger high-pressure pain gun injury.  DISCHARGE DIAGNOSIS:  Left index finger high-pressure pain gun injury.  ATTENDING PHYSICIAN:  Madelynn Done, MD  CONDITION ON DISCHARGE:  Good.  DISCHARGE MEDICATIONS: 1. Keflex 100 mg p.o. q.i.d. 2. Percocet 5/325 one to two tablets every 4-6 hours as needed for     pain.  SURGICAL PROCEDURES:  Left index finger flexor tendon sheath drainage and debridement on August 05, 2010.  BRIEF HISTORY:  [name] is a right-hand dominant gentleman who sustained a work-related high-pressure pain gun injury.  The patient was taken to the operating room on the day of admission.  HOSPITAL COURSE:  The patient was admitted to the Orthopedic Service after undergoing the above procedure.  The patient tolerated this well. He was seen on the postoperative day #2.  The wound looked lot better. He was felt ready to be discharged then.  He was continued on IV antibiotics.  Prior to his discharge, the patient's discharge instructions were explained to him and questions were answered.  The patient voiced understanding of discharge instructions.  All questions were answered and encouraged with him.  The patient resumed the discharge medications.  Close followup as scheduled.  Care was coordinated with his nurse case manager.     Madelynn Done, MD     FWO/MEDQ  D:  08/21/2010  T:  08/22/2010  Job:  [id number]  Electronically Signed by Bradly Bienenstock IV MD on 09/04/2010 09:05:49 AM

## 2010-09-11 NOTE — Letter (Signed)
April 01, 2007    Charlton Haws   RE:  CASH, DUCE  MRN:  [id number]  /  DOB:  11-Sep-1955   Dear Mr. Barbier:   It is my pleasure to have treated you recently as a new patient in my  office.  I appreciate your confidence and the opportunity to participate  in your care.   Since I do have a busy inpatient endoscopy schedule and office schedule,  my office hours vary weekly.  I am, however, available for emergency  calls every day through my office.  If I cannot promptly meet an urgent  office appointment, another one of our gastroenterologists will be able  to assist you.   My well-trained staff are prepared to help you at all times.  For  emergencies after office hours, a physician from our gastroenterology  section is always available through my 24-hour answering service.   While you are under my care, I encourage discussion of your questions  and concerns, and I will be happy to return your calls as soon as I am  available.   Once again, I welcome you as a new patient and I look forward to a happy  and healthy relationship.    Sincerely,      Barbette Hair. Arlyce Dice, MD,FACG  Electronically Signed   RDK/MedQ  DD: 04/01/2007  DT: 04/01/2007  Job #: [id number]

## 2010-09-11 NOTE — Assessment & Plan Note (Signed)
[facility]                         GASTROENTEROLOGY OFFICE NOTE   NAME:[name], [name]                          MRN:          [id number]  DATE:04/01/2007                            DOB:          1955/12/05    REASON FOR CONSULTATION:  Screening colonoscopy.   REASON:  [name] is a pleasant 55 year old white male referred through  the courtesy of Dr. Alphonsus Sias for evaluation. He has a history of  hemorrhoids, though they have not been bothering him recently. He has  occasional pyrosis when he eats spicy foods. He denies change of bowel  habits, abdominal pain, or rectal bleeding.   PAST MEDICAL HISTORY:  Is pertinent for hypertension and coronary artery  disease. He has a history of angina, though has been asymptomatic  recently. He has history of depression and arthritis. He is status post  hemorrhoidectomy.   FAMILY HISTORY:  Is pertinent for his mother with his heart disease.   MEDICATIONS:  Include:  1. Baby aspirin.  2. Metoprolol.  3. Nexium p.r.n.  4. Trazodone.  5. Diphenhydramine.   He has no allergies.   He neither smokes or drinks. 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   REVIEW OF SYSTEMS:  Positive for back pain, muscle cramps and some  sleeping problems.   PHYSICAL EXAMINATION:  On exam, pulse 60, blood pressure 110/78, weight  222.  HEENT:  EOMI.  PERRLA.  Sclerae are anicteric.  Conjunctivae are pink.  NECK:  Supple without thyromegaly, adenopathy or carotid bruits.  CHEST:  Clear to auscultation and percussion without adventitious  sounds.  CARDIAC:  Regular rhythm; normal S1 S2.  There are no murmurs, gallops  or rubs.  ABDOMEN:  Bowel sounds are normoactive.  Abdomen is soft, nontender and  nondistended.  There are no abdominal masses, tenderness, splenic  enlargement or hepatomegaly.  EXTREMITIES:  Full range of motion.  No cyanosis, clubbing or edema.  RECTAL:  Deferred.   ASSESSMENT AND PLAN:  I will  scheduled [name] for screening  colonoscopy. Cardiac issues are not active at this time.     [name]. Arlyce Dice, MD,FACG  Electronically Signed    RDK/MedQ  DD: 04/01/2007  DT: 04/01/2007  Job #: [id number]   cc:   Karie Schwalbe, MD

## 2010-09-11 NOTE — Letter (Signed)
April 01, 2007    Karie Schwalbe, M.D.  [address] [place], [place] [address]   RE:  BRIGHAM, COBBINS  MRN:  [id number]  /  DOB:  August 27, 1955   Dear Dr. Alphonsus Sias:   Upon your kind referral, I had the pleasure of evaluating your patient  and I am pleased to offer my findings.  I saw Mr. Gerstner in the office  today.  Enclosed is a copy of my progress note that details my findings  and recommendations.   Thank you for the opportunity to participate in your patient's care.    Sincerely,      Barbette Hair. Arlyce Dice, MD,FACG  Electronically Signed    RDK/MedQ  DD: 04/01/2007  DT: 04/01/2007  Job #: [id number]

## 2010-09-14 NOTE — Consult Note (Signed)
NAMEHARLOW, CARRIZALES NO.:  [id number]   MEDICAL RECORD NO.:  [id number]          PATIENT TYPE:  INP   LOCATION:  1825                         FACILITY:  [facility]   PHYSICIAN:  Lyn Records, M.D.   DATE OF BIRTH:  06-22-1955   DATE OF CONSULTATION:  10/29/2005  DATE OF DISCHARGE:                                   CONSULTATION   CONCLUSIONS:  1.  Prolonged chest discomfort with quality consistent with angina.  2.  History of cigarette smoking.  3.  Family history of coronary artery disease.   RECOMMENDATIONS:  1.  Serial enzymes.  2.  Antithrombotic therapy.  Unfortunately, Lovenox has already been given.  3.  Coronary angiography to define coronary anatomy.  Will be at increased      risk of bleeding, but we will go ahead and proceed with catheterization      now, as the patient continues to have chest pain despite IV      nitroglycerin and morphine.  His EKG is negative.  We are concerned      about trying to do a stress test with ongoing chest pain.   COMMENTS:  The patient is 49 and at about 11 p.m. began having chest  discomfort.  He awakened at about 4 a.m. after going to bed with the chest  discomfort and it was still there.  He states that it felt like someone was  sitting on his chest.  He came to the ER where he got a sublingual  nitroglycerin, and there was a decrease in the pain.  He is still having a  mild substernal discomfort that is waxing and waning somewhat.  There is no  shortness of breath or radiation into the arms and neck.  No diaphoresis.  No nausea or vomiting.   ALLERGIES:  MORPHINE CAUSES ITCHING.   MEDICATIONS:  Nexium.   HABITS:  Two packs per day.  Occasional beer.  Does not use illicit drugs.   PAST MEDICAL HISTORY:  1.  COPD.  2.  Gastroesophageal reflux disease.  3.  History of hemorrhoidectomy.   PHYSICAL EXAMINATION:  GENERAL:  The patient is in no acute distress.  He is  still having discomfort.  He uses the [place]  sign as he describes the  discomfort.  VITAL SIGNS:  Blood pressure 120/70, heart rate 60, respirations 16.  HEENT:  Unremarkable.  Extraocular movements are full.  NECK:  No JVD or carotid bruits.  CHEST:  Clear.  CARDIAC:  No gallop, no rub, no click, no murmur.  ABDOMEN:  Soft.  EXTREMITIES:  No edema.  Femoral pulses are 2+.  NEUROLOGIC:  Normal.   LABORATORY DATA:  Chest x-ray:  Nonacute.  EKG is nonischemic.  BUN and  creatinine 12 and 1.1, potassium 4.5.  D-dimer is 0.27 which is normal.   DISCUSSION:  The patient's clinical scenario is concerning for unstable  angina, although we have no objective data.  He continues to have chest  discomfort on nitroglycerin and is having a continuous discomfort in his  chest.  Under these circumstances,  I am concerned about performing  Cardiolite scintigraphy, and we are unable to do any diagnostic procedures  until Thursday if we do not study the patient today.  Unfortunately, he just  received Lovenox, so with catheterization there would be a slightly  increased risk of bleeding.  We will hopefully be able to Angio-Seal the  patient or we will leave the sheath in until it is safe to pull it.      Lyn Records, M.D.  Electronically Signed     HWS/MEDQ  D:  10/29/2005  T:  10/29/2005  Job:  [id number]   cc:   Laurita Quint, M.D.  Fax: [phone]

## 2010-09-14 NOTE — Cardiovascular Report (Signed)
[name], [name] NO.:  [id number]   MEDICAL RECORD NO.:  [id number]          PATIENT TYPE:  INP   LOCATION:  1825                         FACILITY:  [facility]   PHYSICIAN:  Lyn Records, M.D.   DATE OF BIRTH:  [birth year]   DATE OF PROCEDURE:  10/29/2005  DATE OF DISCHARGE:                              CARDIAC CATHETERIZATION   INDICATION FOR THIS CARDIAC CATHETERIZATION:  Unstable anginal clinical  presentation.   PROCEDURES PERFORMED:  1.  Left heart cath.  2.  Coronary angiography.  3.  Left ventriculography.  4.  AngioSeal.   DESCRIPTION:  After informed consent, a 6-French sheath was placed in the  right femoral artery.  The patient understood the higher than usual risk of  bleeding since he had received Lovenox subcu within 3 hours of the  procedure.  We felt that this was clinically indicated, however, because he  was continuing to have chest pain that was of anginal quality.   After placement of the sheath, a 6-French A2 multipurpose catheter was used  for hemodynamic recordings, left ventriculography by hand injection and  selective left and right coronary angiography.  Following coronary  angiography.  The digital images were reviewed and case was terminated.  Sheathogram was performed and AngioSeal arteriotomy closure was performed  successfully.  Hemostasis was excellent.   RESULTS:  1.  Hemodynamic data:      1.  Left ventricle pressure 111/2 mmHg.      2.  Aortic pressure 110/64.  2.  Left ventriculography:  Left ventricular cavity size and systolic      function are normal.  No regional wall motion abnormalities noted.      Ejection fraction 60%.  3.  Coronary angiography.      1.  Left main coronary:  Widely patent.      2.  Left anterior descending coronary:  Mid left anterior descending          contains a segmental somewhat hazy appearing 50-70% segmental lesion          starting just proximal to the first septal perforator in the  mid          vessel.  Two large diagonals arise from the left anterior          descending.  Other regions of luminal irregularity are noted in the          left anterior descending and in the diagonals but no high-grade          lesions are noted.  The left anterior descending wraps around the          apex.  TIMI grade 3 flow was noted in the left anterior descending.      3.  Circumflex artery:  The circumflex artery gives origin to four          obtuse marginals.  The marginal vessels arise starting in the mid          lateral wall and distally.  The very first marginal that arises from  the circumflex is a dominant of the four marginals.  The other three          marginals are very small. Luminal irregularities are noted          throughout the proximal and mid circumflex and in the dominant first          marginal branch but no high-grade obstruction is present.      4.  Right coronary:  Right coronary is dominant, multiple regions of          luminal irregularity are noted in the proximal, mid and distal          vessel.  No high-grade obstruction is seen.  The PDA and left          ventricular branch are noted.   CONCLUSION:  1.  Probable unstable angina due to ruptured/ulcerated plaque in the mid LAD      with possible thrombus present that obstructs the vessel by less than or      equal to 70%.  It is felt at this time that with good flow and with less      than significant luminal obstruction that a more conservative approach      with medical therapy is warranted.  2.  Luminal irregularities in the right coronary artery and circumflex.  3.  Normal left ventricular function.   PLAN:  1.  Lovenox x48 hours.  2.  Aspirin and Plavix.  3.  Statin therapy.  4.  Home on Thursday a.m. if no recurrent symptoms.  5.  If recurrent symptoms, consider Cardiolite scintigraphy to look for      evidence of anterior ischemia if documented, percutaneous coronary      intervention  of the mid left anterior descending.      Lyn Records, M.D.  Electronically Signed     HWS/MEDQ  D:  10/29/2005  T:  10/29/2005  Job:  [id number]   cc:   Laurita Quint, M.D.  Fax: [phone]

## 2010-09-14 NOTE — Discharge Summary (Signed)
[name], [name]                 ACCOUNT NO.:  [id number]   MEDICAL RECORD NO.:  [id number]          PATIENT TYPE:  INP   LOCATION:  2917                         FACILITY:  [facility]   PHYSICIAN:  Sherin Quarry, MD      DATE OF BIRTH:  1955-09-22   DATE OF ADMISSION:  10/29/2005  DATE OF DISCHARGE:                                 DISCHARGE SUMMARY   HISTORY OF PRESENT ILLNESS:  [name] is a 55 year old man with a  longstanding history of cigarette smoking and high cholesterol who presented  to the emergency room on October 29, 2005 with a history of 3 episodes of severe  chest discomfort; the latest of these woke him up from sleep and lasted for  approximately 1 hour.  In the emergency room, the patient was evaluated, was  given sublingual nitroglycerin and experienced improvement in his symptoms.  His electrocardiogram showed no ischemic changes and point-of-care enzymes  were negative.  The patient was therefore admitted for further evaluation.   PHYSICAL EXAM:  VITAL SIGNS:  As described by Dr. Nehemiah Settle, blood pressure was  121/75, pulse 61, respirations 16.  HEENT:  Exam was within normal limits.  CHEST:  Clear.  CARDIOVASCULAR:  Exam showed a normal S1 and S2 without rubs, murmurs or  gallops.  ABDOMEN:  Benign.  NEUROLOGIC:  Cranial nerves, motor, sensory and cerebellar testing were  normal.  EXTREMITIES:  No cyanosis or edema.   RADIOLOGIC FINDINGS:  Chest x-ray was negative.   HOSPITAL COURSE:  On admission, Dr. Nehemiah Settle started the patient on Lovenox 1  mg/kg subcutaneously q.12 h., aspirin 81 mg daily, Lopressor 12.5 mg b.i.d.  and continued Protonix.  He obtained a consultation with Dr. Katrinka Blazing of the  Cardiology Service, who felt the patient's symptoms were compatible with  unstable angina; therefore, on October 29, 2005, the patient underwent cardiac  catheterization.  This study showed normal left ventricular ejection  fraction of 60%.  Dr. Katrinka Blazing described the LAD has  ulcerated with a 50% to  70% obstruction.  He also noted diffuse disease in the circumflex coronary  artery with multiple, less than 40% plaques.  He also described luminal  irregularity in the right coronary artery.  Dr. Katrinka Blazing recommended that the  patient be given Lovenox for 48 hours, as well as aspirin and Plavix.  He  recommended starting a statin drug.  The patient was observed in the  hospital over the next 48 hours and experienced no recurrent chest pain.  His vital signs remained stable.  A lipid profile revealed that his LDL  cholesterol was 132, HDL 32; therefore, On October 31, 2005, Dr. Katrinka Blazing  recommended that the patient be discharged.  He recommended that the patient  receive aspirin and Plavix for 1 month and Lipitor for management of his  cholesterol.  Therefore, on October 31, 2005, the patient was discharged.   DISCHARGE DIAGNOSES:  1.  Unstable angina.  2.  Coronary artery disease by cardiac catheterization as described above.  3.  Hyperlipidemia.   DISCHARGE MEDICATIONS:  1.  Lipitor 40 mg daily.  2.  Aspirin 325 mg daily.  3.  Plavix 75 mg daily.  4.  Nexium 40 mg daily.  5.  Lopressor 12.5 mg b.i.d.   FOLLOWUP:  The patient will be instructed follow up with Dr. Katrinka Blazing in 2  weeks.  He is not sure who he is going to be seeing for a primary doctor,  but perhaps he will be going to the [facility] at [place].   CONDITION AT TIME OF DISCHARGE:  Good.           ______________________________  Sherin Quarry, MD     SY/MEDQ  D:  10/31/2005  T:  10/31/2005  Job:  [id number]   cc:   Lyn Records, M.D.  Fax: [phone]   [facility] at [place], M.D.  Fax: [phone]

## 2010-09-14 NOTE — H&P (Signed)
NAMEDONOVEN, PETT                 ACCOUNT NO.:  [id number]   MEDICAL RECORD NO.:  [id number]          PATIENT TYPE:  EMS   LOCATION:  MAJO                         FACILITY:  [facility]   PHYSICIAN:  Deirdre Peer. Polite, M.D. DATE OF BIRTH:  [birth year]   DATE OF ADMISSION:  10/29/2005  DATE OF DISCHARGE:                                HISTORY & PHYSICAL   CHIEF COMPLAINT:  Chest pain.   HISTORY OF PRESENT ILLNESS:  Mr. Swindle is a 55 year old male with known  history of high cholesterol and tobacco abuse of greater than 30 years who  presents to the ED after having recurrent chest pain x 3.  The patient  states he was in his usual state of health until last night when he was  awakened on three occasions with chest pain, rated an 8/10, in a substernal  distribution without radiation to the neck but positive radiation to his  arm.  The patient stated the pain lasted about an hour.  Denies any  diaphoresis, palpitations or shortness of breath.  Just felt heaviness in  his chest.  The patient stated he was able to fall asleep.  However, on the  third time, the patient asked his wife to bring him to the ED.  She brought  him to the ED, herself.  In the ED, the patient was evaluated, given  sublingual nitroglycerin and stated he had improvement in his symptoms.  The  patient's EKG is without acute abnormalities, and point-of-care enzymes so  far have been negative.  He denies any fever, chills, nausea or vomiting.  Positive chronic cough from his COPD.  Positive symptoms of GERD; however,  his current chest pain is not like such.  He denies any orthopnea, PND or  dyspnea on exertion.  Because of the patient's normal EKG and enzymes being  negative, the [facility] hospitalist was called for further evaluation and  admission.   PAST MEDICAL HISTORY:  As stated above.   MEDICATIONS:  Nexium.   SOCIAL HISTORY:  Positive for tobacco.  Negative alcohol.  No drugs.   PAST SURGICAL HISTORY:  None.   ALLERGIES:  The patient states he did experience pruritus post given  MORPHINE in the ED.   FAMILY HISTORY:  Father unknown.  Mother with history of CVA and also a  history of heart problems with MI in her 41s.  Brothers and sisters healthy.   REVIEW OF SYSTEMS:  As stated in the HPI.   PHYSICAL EXAMINATION:  GENERAL:  The patient is alert and oriented x 3.  VITAL SIGNS:  Temp 97.4, BP 121/75, pulse 61, respiratory rate 16.  Sat 98%  on room air.  HEENT:  Anicteric sclerae.  Moist oral mucosa.  NECK:  No nodes.  No JVD.  No carotid bruit.  CHEST:  Moderate air movement without rales or rhonchi.  CARDIOVASCULAR:  Regular.  No S3.  ABDOMEN:  Soft and nontender.  EXTREMITIES:  No clubbing, cyanosis or edema.  Two-plus pulses.  NEURO:  Nonfocal.   Chest x-ray:  No apparent disease.  BMET:  Within normal limits.  Point-of-  care enzymes:  Within normal limits.  LFTs:  Within normal limits.  EKG:  Normal sinus rhythm.  No Qs.  No ST elevation.   ASSESSMENT:  1.  Recurrent chest pain in a patient with significant risk factors, which      include tobacco abuse, high cholesterol and family history of coronary      artery disease, and he also experienced relief post given sublingual      nitroglycerin in the ED.  2.  High cholesterol.  3.  Elevated blood pressure in the emergency department.   RECOMMENDATIONS:  That the patient be admitted to a step-down unit as he  currently is on IV nitroglycerin.  The patient will be given Lovenox,  aspirin and beta blocker.  Serial cardiac enzymes.  Will have a cardiology  evaluation for further risk stratification.  Thank you in advance.      Deirdre Peer. Polite, M.D.  Electronically Signed     RDP/MEDQ  D:  10/29/2005  T:  10/29/2005  Job:  [id number]

## 2010-11-26 ENCOUNTER — Other Ambulatory Visit: Payer: Self-pay | Admitting: *Deleted

## 2010-11-26 MED ORDER — FLUTICASONE PROPIONATE 50 MCG/ACT NA SUSP: 2.0000 | Freq: Every day | NASAL | Status: DC

## 2010-12-21 ENCOUNTER — Ambulatory Visit: Payer: Self-pay | Admitting: Internal Medicine

## 2010-12-21 ENCOUNTER — Ambulatory Visit: Payer: PRIVATE HEALTH INSURANCE | Admitting: Internal Medicine

## 2010-12-24 ENCOUNTER — Other Ambulatory Visit: Payer: Self-pay | Admitting: *Deleted

## 2010-12-24 MED ORDER — METOPROLOL TARTRATE 50 MG PO TABS: 50.0000 mg | ORAL_TABLET | Freq: Every day | ORAL | Status: DC

## 2010-12-24 NOTE — Telephone Encounter (Signed)
rx sent to pharmacy by e-script  

## 2010-12-28 ENCOUNTER — Ambulatory Visit: Payer: PRIVATE HEALTH INSURANCE | Admitting: Internal Medicine

## 2011-01-09 ENCOUNTER — Encounter: Payer: Self-pay | Admitting: Internal Medicine

## 2011-01-10 ENCOUNTER — Ambulatory Visit: Payer: PRIVATE HEALTH INSURANCE | Admitting: Internal Medicine

## 2011-02-08 ENCOUNTER — Other Ambulatory Visit: Payer: Self-pay | Admitting: *Deleted

## 2011-02-08 MED ORDER — SILDENAFIL CITRATE 100 MG PO TABS: ORAL_TABLET | ORAL | Status: DC

## 2011-06-04 ENCOUNTER — Encounter: Payer: Self-pay | Admitting: Internal Medicine

## 2011-06-04 ENCOUNTER — Ambulatory Visit (INDEPENDENT_AMBULATORY_CARE_PROVIDER_SITE_OTHER): Payer: PRIVATE HEALTH INSURANCE | Admitting: Internal Medicine

## 2011-06-04 VITALS — BP 112/70 | HR 60 | Temp 98.1°F | Ht 70.0 in | Wt 240.0 lb

## 2011-06-04 DIAGNOSIS — I10 Essential (primary) hypertension: Secondary | ICD-10-CM

## 2011-06-04 DIAGNOSIS — R7301 Impaired fasting glucose: Secondary | ICD-10-CM

## 2011-06-04 DIAGNOSIS — I2510 Atherosclerotic heart disease of native coronary artery without angina pectoris: Secondary | ICD-10-CM

## 2011-06-04 DIAGNOSIS — E785 Hyperlipidemia, unspecified: Secondary | ICD-10-CM

## 2011-06-04 DIAGNOSIS — J019 Acute sinusitis, unspecified: Secondary | ICD-10-CM

## 2011-06-04 LAB — CBC WITH DIFFERENTIAL/PLATELET
Basophils Absolute: 0.1 10*3/uL (ref 0.0–0.1)
Basophils Relative: 1 % (ref 0.0–3.0)
Eosinophils Absolute: 0.6 10*3/uL (ref 0.0–0.7)
Eosinophils Relative: 6.2 % — ABNORMAL HIGH (ref 0.0–5.0)
HCT: 45.2 % (ref 39.0–52.0)
Hemoglobin: 15.2 g/dL (ref 13.0–17.0)
Lymphocytes Relative: 30.5 % (ref 12.0–46.0)
Lymphs Abs: 3 10*3/uL (ref 0.7–4.0)
MCHC: 33.6 g/dL (ref 30.0–36.0)
MCV: 90.3 fl (ref 78.0–100.0)
Monocytes Absolute: 1 10*3/uL (ref 0.1–1.0)
Monocytes Relative: 10.5 % (ref 3.0–12.0)
Neutro Abs: 5.2 10*3/uL (ref 1.4–7.7)
Neutrophils Relative %: 51.8 % (ref 43.0–77.0)
Platelets: 277 10*3/uL (ref 150.0–400.0)
RBC: 5 Mil/uL (ref 4.22–5.81)
RDW: 13.6 % (ref 11.5–14.6)
WBC: 10 10*3/uL (ref 4.5–10.5)

## 2011-06-04 LAB — BASIC METABOLIC PANEL
BUN: 14 mg/dL (ref 6–23)
CO2: 29 mEq/L (ref 19–32)
Calcium: 9.2 mg/dL (ref 8.4–10.5)
Chloride: 104 mEq/L (ref 96–112)
Creatinine, Ser: 1 mg/dL (ref 0.4–1.5)
GFR: 84.04 mL/min (ref >60.00)
Glucose, Bld: 101 mg/dL — ABNORMAL HIGH (ref 70–99)
Potassium: 4.1 mEq/L (ref 3.5–5.1)
Sodium: 139 mEq/L (ref 135–145)

## 2011-06-04 LAB — TSH: TSH: 0.67 u[IU]/mL (ref 0.35–5.50)

## 2011-06-04 LAB — HEMOGLOBIN A1C: Hgb A1c MFr Bld: 6.3 % (ref 4.6–6.5)

## 2011-06-04 LAB — HEPATIC FUNCTION PANEL
ALT: 30 U/L (ref 0–53)
AST: 23 U/L (ref 0–37)
Albumin: 3.9 g/dL (ref 3.5–5.2)
Alkaline Phosphatase: 109 U/L (ref 39–117)
Bilirubin, Direct: 0 mg/dL (ref 0.0–0.3)
Total Bilirubin: 0.9 mg/dL (ref 0.3–1.2)
Total Protein: 7.6 g/dL (ref 6.0–8.3)

## 2011-06-04 MED ORDER — AMOXICILLIN-POT CLAVULANATE 875-125 MG PO TABS: 1.0000 | ORAL_TABLET | Freq: Two times a day (BID) | ORAL | Status: AC

## 2011-06-04 MED ORDER — PRAVASTATIN SODIUM 20 MG PO TABS: 20.0000 mg | ORAL_TABLET | Freq: Every day | ORAL | Status: DC

## 2011-06-04 NOTE — Patient Instructions (Signed)
If you do okay with the new cholesterol medication and are still on it, please set up blood work for about 6 weeks (lipid, hepatic---272.4)

## 2011-06-04 NOTE — Assessment & Plan Note (Signed)
BP Readings from Last 3 Encounters:  06/04/11 112/70  06/20/10 116/67  02/05/10 142/80   Good control no changes needed

## 2011-06-04 NOTE — Assessment & Plan Note (Signed)
Seems to be quiet On asa and beta blocker Will retry another statin

## 2011-06-04 NOTE — Progress Notes (Signed)
Subjective:    Patient ID: [name], male    DOB: 1956-03-30, 56 y.o.   MRN: [id number]  HPI Doing okay Has not seen the cardiologist in some time  No heart trouble Still physically active at work Not much exercise otherwise  No chest pain No palpitations No edema No dizziness or sycnope No SOB No change in exercise tolerance  Sleeps okay Uses the medication only prn---not that often  Reviewed problems with statins Had finger and joint pain  Having cold symptoms for a while---3 weeks Lots of cough PND  OTC meds not helpful  Current Outpatient Prescriptions on File Prior to Visit  Medication Sig Dispense Refill  . aspirin 81 MG tablet Take 81 mg by mouth daily.        . fluticasone (FLONASE) 50 MCG/ACT nasal spray Place 2 sprays into the nose daily.  16 g  2  . metoprolol (LOPRESSOR) 50 MG tablet Take 1 tablet (50 mg total) by mouth daily.  30 tablet  11  . nitroGLYCERIN (NITROSTAT) 0.4 MG SL tablet Place 0.4 mg under the tongue every 5 (five) minutes as needed.        Marland Kitchen omeprazole (PRILOSEC) 20 MG capsule Take 1 capsule (20 mg total) by mouth daily.  30 capsule  11  . traZODone (DESYREL) 100 MG tablet Take 100-200 mg by mouth at bedtime.          Allergies  Allergen Reactions  . Atorvastatin     REACTION: muscle aches  . Simvastatin     REACTION: hand pain    Past Medical History  Diagnosis Date  . CAD (coronary artery disease)   . Hypertension   . GERD (gastroesophageal reflux disease)   . Hyperlipidemia   . ED (erectile dysfunction)   . Sleep disorder   . Hx of colonic polyps   . Impaired fasting glucose   . Wrist fracture      6 foot fall onto head, concussion, L wrist (comminuted with intra-articular extension) and 2nd finger (middle phalanx) fxs, vertigo    Past Surgical History  Procedure Date  . Branchial cleft cyst excision   . Hemorrhoid surgery   . Rhinoplasty     No family history on file.  History   Social History  . Marital  Status: Married    Spouse Name: N/A    Number of Children: 4  . Years of Education: N/A   Occupational History  . installs signs    Social History Main Topics  . Smoking status: Former Smoker    Quit date: 10/27/2005  . Smokeless tobacco: Never Used  . Alcohol Use: Yes     rare  . Drug Use: No  . Sexually Active: Not on file   Other Topics Concern  . Not on file   Social History Narrative  . No narrative on file   Review of Systems Weight is down 5# Bowels okay Heartburn mostly quiet---only uses omeprazole prn    Objective:   Physical Exam  Constitutional: He appears well-developed and well-nourished. No distress.  HENT:  Mouth/Throat: Oropharynx is clear and moist. No oropharyngeal exudate.       Mild frontal and maxillary pressure with touch Moderate nasal inflammation  Neck: Normal range of motion. Neck supple. No thyromegaly present.  Cardiovascular: Normal rate, regular rhythm, normal heart sounds and intact distal pulses.  Exam reveals no gallop.   No murmur heard. Pulmonary/Chest: Effort normal and breath sounds normal. No respiratory distress. He has no  wheezes. He has no rales.  Musculoskeletal: He exhibits no edema and no tenderness.  Lymphadenopathy:    He has no cervical adenopathy.  Skin:       Small papule with surrounding redness on right wrist  Psychiatric: He has a normal mood and affect. His behavior is normal. Judgment and thought content normal.          Assessment & Plan:

## 2011-06-04 NOTE — Assessment & Plan Note (Signed)
Sick for 3 weeks Will use augmentin because of the skin lesion he has (in case that is bacterial)

## 2011-06-04 NOTE — Assessment & Plan Note (Signed)
wil try another statin If side effects, will have to give up on Rx

## 2011-07-16 ENCOUNTER — Other Ambulatory Visit: Payer: Self-pay

## 2011-08-02 ENCOUNTER — Other Ambulatory Visit (INDEPENDENT_AMBULATORY_CARE_PROVIDER_SITE_OTHER): Payer: PRIVATE HEALTH INSURANCE

## 2011-08-02 DIAGNOSIS — E785 Hyperlipidemia, unspecified: Secondary | ICD-10-CM

## 2011-08-02 LAB — HEPATIC FUNCTION PANEL
ALT: 29 U/L (ref 0–53)
AST: 28 U/L (ref 0–37)
Albumin: 3.7 g/dL (ref 3.5–5.2)
Alkaline Phosphatase: 93 U/L (ref 39–117)
Bilirubin, Direct: 0.1 mg/dL (ref 0.0–0.3)
Total Bilirubin: 0.8 mg/dL (ref 0.3–1.2)
Total Protein: 6.9 g/dL (ref 6.0–8.3)

## 2011-08-02 LAB — LIPID PANEL
Cholesterol: 175 mg/dL (ref 0–200)
HDL: 48.5 mg/dL (ref >39.00)
LDL Cholesterol: 108 mg/dL — ABNORMAL HIGH (ref 0–99)
Total CHOL/HDL Ratio: 4
Triglycerides: 91 mg/dL (ref 0.0–149.0)
VLDL: 18.2 mg/dL (ref 0.0–40.0)

## 2011-10-07 ENCOUNTER — Other Ambulatory Visit: Payer: Self-pay | Admitting: *Deleted

## 2011-10-07 MED ORDER — OMEPRAZOLE 20 MG PO CPDR: 20.0000 mg | DELAYED_RELEASE_CAPSULE | Freq: Every day | ORAL | Status: DC

## 2011-11-18 ENCOUNTER — Ambulatory Visit (INDEPENDENT_AMBULATORY_CARE_PROVIDER_SITE_OTHER): Payer: PRIVATE HEALTH INSURANCE | Admitting: Family Medicine

## 2011-11-18 ENCOUNTER — Encounter: Payer: Self-pay | Admitting: Family Medicine

## 2011-11-18 VITALS — BP 138/80 | HR 66 | Temp 98.0°F | Ht 70.0 in | Wt 250.0 lb

## 2011-11-18 DIAGNOSIS — R319 Hematuria, unspecified: Secondary | ICD-10-CM

## 2011-11-18 LAB — COMPREHENSIVE METABOLIC PANEL
ALT: 28 U/L (ref 0–53)
AST: 23 U/L (ref 0–37)
Albumin: 3.9 g/dL (ref 3.5–5.2)
Alkaline Phosphatase: 102 U/L (ref 39–117)
BUN: 14 mg/dL (ref 6–23)
CO2: 28 mEq/L (ref 19–32)
Calcium: 9.4 mg/dL (ref 8.4–10.5)
Chloride: 106 mEq/L (ref 96–112)
Creatinine, Ser: 1.2 mg/dL (ref 0.4–1.5)
GFR: 69.76 mL/min (ref >60.00)
Glucose, Bld: 120 mg/dL — ABNORMAL HIGH (ref 70–99)
Potassium: 4.1 mEq/L (ref 3.5–5.1)
Sodium: 142 mEq/L (ref 135–145)
Total Bilirubin: 0.7 mg/dL (ref 0.3–1.2)
Total Protein: 7 g/dL (ref 6.0–8.3)

## 2011-11-18 LAB — PSA: PSA: 0.42 ng/mL (ref 0.10–4.00)

## 2011-11-18 LAB — POCT URINALYSIS DIPSTICK
Bilirubin, UA: NEGATIVE
Glucose, UA: NEGATIVE
Ketones, UA: NEGATIVE
Nitrite, UA: NEGATIVE
Spec Grav, UA: >=1.03
Urobilinogen, UA: NEGATIVE
pH, UA: 7.5

## 2011-11-18 LAB — CBC WITH DIFFERENTIAL/PLATELET
Basophils Absolute: 0.1 10*3/uL (ref 0.0–0.1)
Basophils Relative: 1 % (ref 0.0–3.0)
Eosinophils Absolute: 0.8 10*3/uL — ABNORMAL HIGH (ref 0.0–0.7)
Eosinophils Relative: 8.5 % — ABNORMAL HIGH (ref 0.0–5.0)
HCT: 44.7 % (ref 39.0–52.0)
Hemoglobin: 14.6 g/dL (ref 13.0–17.0)
Lymphocytes Relative: 30.1 % (ref 12.0–46.0)
Lymphs Abs: 2.9 10*3/uL (ref 0.7–4.0)
MCHC: 32.7 g/dL (ref 30.0–36.0)
MCV: 91.1 fl (ref 78.0–100.0)
Monocytes Absolute: 0.9 10*3/uL (ref 0.1–1.0)
Monocytes Relative: 8.9 % (ref 3.0–12.0)
Neutro Abs: 5 10*3/uL (ref 1.4–7.7)
Neutrophils Relative %: 51.5 % (ref 43.0–77.0)
Platelets: 224 10*3/uL (ref 150.0–400.0)
RBC: 4.91 Mil/uL (ref 4.22–5.81)
RDW: 13.4 % (ref 11.5–14.6)
WBC: 9.6 10*3/uL (ref 4.5–10.5)

## 2011-11-18 LAB — MICROALBUMIN / CREATININE URINE RATIO
Creatinine,U: 152.6 mg/dL
Microalb Creat Ratio: 17.6 mg/g (ref 0.0–30.0)
Microalb, Ur: 26.9 mg/dL — ABNORMAL HIGH (ref 0.0–1.9)

## 2011-11-18 NOTE — Progress Notes (Signed)
  Subjective:    Patient ID: [name], male    DOB: 1955-08-18, 56 y.o.   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  Very hot outside but did think stayed hydrated.  That night started urinating tea colored urine.  Next day dark color urine as well.  Also noticed thicker urine.   Lab Results  Component Value Date   CREATININE 1.0 06/04/2011   No dysuria, urgency.  No leg swelling. Never had blood in urine in past.  Never had kidney stones.   Only on metoprolol tartrate 50mg  once daily for blood pressure. Takes baby aspirin in am, has been taking 400mg  ibuprofen 2-3x daily. No EtOH, no smoking.  Past Medical History  Diagnosis Date  . CAD (coronary artery disease)   . Hypertension   . GERD (gastroesophageal reflux disease)   . Hyperlipidemia   . ED (erectile dysfunction)   . Sleep disorder   . Hx of colonic polyps   . Impaired fasting glucose   . Wrist fracture      6 foot fall onto head, concussion, L wrist (comminuted with intra-articular extension) and 2nd finger (middle phalanx) fxs, vertigo    Family History  Problem Relation Age of Onset  . Coronary artery disease Mother   . Coronary artery disease Brother   . Diabetes Neg Hx   . Cancer Brother     colon or rectal  . Stroke Mother     Review of Systems Denies any fevers/chills, recent viral illnesses, abd pain, n/v, d/c, blood in stool.  No flank pain.  No headaches.   Occasional right upper arm muscle pain.    Objective:   Physical Exam  Nursing note and vitals reviewed. Constitutional: He appears well-developed and well-nourished. No distress.  HENT:  Head: Normocephalic and atraumatic.  Mouth/Throat: Oropharynx is clear and moist. No oropharyngeal exudate.  Eyes: Conjunctivae and EOM are normal. Pupils are equal, round, and reactive to light. No scleral icterus.  Neck: Normal range of motion. Neck supple.  Cardiovascular: Normal rate, regular rhythm,  normal heart sounds and intact distal pulses.   No murmur heard. Pulmonary/Chest: Effort normal and breath sounds normal. No respiratory distress. He has no wheezes. He has no rales.  Abdominal: Soft. Bowel sounds are normal. He exhibits no distension and no mass. There is no hepatosplenomegaly. There is no tenderness. There is no rebound, no guarding and no CVA tenderness.  Musculoskeletal: He exhibits edema (trace pedal edema).  Lymphadenopathy:    He has no cervical adenopathy.  Skin: Skin is warm and dry. No rash noted. No erythema.  Psychiatric: He has a normal mood and affect.      Assessment & Plan:

## 2011-11-18 NOTE — Patient Instructions (Addendum)
I have sent off some more tests today - including culture of urine to rule out infection. If kidneys return normal, I will refer you to urologist for further evaluation of blood in *urine. We will call you with results of blood work and urine tests. Ensure staying well hydrated with plenty of water for now.

## 2011-11-18 NOTE — Assessment & Plan Note (Signed)
Recent exhertion outside, possible dehydration. Takes aspirin daily and has recently been on more NSAIDs. UA/micro suspicious for active sediment so sent microscopy to lab. Also check blood work today (CMP, CBC, PSA) and UCx to r/o infection. If normal exam, and nonactive sediment, will refer to urology for further evaluation of gross hematuria. Will call pt with results of blood work and urine studies.

## 2011-11-19 LAB — URINALYSIS, MICROSCOPIC ONLY
Bacteria, UA: NONE SEEN
Casts: NONE SEEN
Crystals: NONE SEEN
RBC / HPF: >50 RBC/hpf — AB (ref <3)
Squamous Epithelial / LPF: NONE SEEN

## 2011-11-20 ENCOUNTER — Other Ambulatory Visit: Payer: Self-pay | Admitting: Family Medicine

## 2011-11-20 DIAGNOSIS — R319 Hematuria, unspecified: Secondary | ICD-10-CM

## 2011-11-20 LAB — URINE CULTURE
Colony Count: NO GROWTH
Organism ID, Bacteria: NO GROWTH

## 2011-11-29 ENCOUNTER — Ambulatory Visit (INDEPENDENT_AMBULATORY_CARE_PROVIDER_SITE_OTHER): Payer: PRIVATE HEALTH INSURANCE | Admitting: Internal Medicine

## 2011-11-29 ENCOUNTER — Encounter: Payer: Self-pay | Admitting: Internal Medicine

## 2011-11-29 ENCOUNTER — Telehealth: Payer: Self-pay | Admitting: Internal Medicine

## 2011-11-29 VITALS — BP 126/75 | HR 59 | Temp 97.6°F | Ht 70.5 in | Wt 246.5 lb

## 2011-11-29 DIAGNOSIS — R7301 Impaired fasting glucose: Secondary | ICD-10-CM

## 2011-11-29 DIAGNOSIS — I2510 Atherosclerotic heart disease of native coronary artery without angina pectoris: Secondary | ICD-10-CM

## 2011-11-29 DIAGNOSIS — E785 Hyperlipidemia, unspecified: Secondary | ICD-10-CM

## 2011-11-29 DIAGNOSIS — R319 Hematuria, unspecified: Secondary | ICD-10-CM

## 2011-11-29 DIAGNOSIS — Z0000 Encounter for general adult medical examination without abnormal findings: Secondary | ICD-10-CM | POA: Insufficient documentation

## 2011-11-29 MED ORDER — METOPROLOL TARTRATE 50 MG PO TABS: 50.0000 mg | ORAL_TABLET | Freq: Every day | ORAL | Status: DC

## 2011-11-29 NOTE — Assessment & Plan Note (Signed)
Fairly healthy Discussed fitness Had normal PSA UTD on colon

## 2011-11-29 NOTE — Assessment & Plan Note (Signed)
Lab Results  Component Value Date   LDLCALC 108* 08/02/2011   Not quite at goal but very concerned about arm pain No increase now since he failed multiple other statins

## 2011-11-29 NOTE — Assessment & Plan Note (Signed)
Non fasting 120 discussed weight loss---esp belly fat

## 2011-11-29 NOTE — Assessment & Plan Note (Signed)
Bladder lesion is not unlikely Has appt today

## 2011-11-29 NOTE — Progress Notes (Signed)
Subjective:    Patient ID: [name], male    DOB: [birth year], 56 y.o.   MRN: [id number]  HPI Here for physical Urine is better now Has urology appt later today  Having right arm pain Using ibuprofen but not much help Doesn't remember any injury Concerned about the cholesterol med  Discussed blood sugars Last was 120---this wasn't fasting Discussed weight loss Current Outpatient Prescriptions on File Prior to Visit  Medication Sig Dispense Refill  . aspirin 81 MG tablet Take 81 mg by mouth daily.        . metoprolol (LOPRESSOR) 50 MG tablet Take 1 tablet (50 mg total) by mouth daily.  30 tablet  11  . nitroGLYCERIN (NITROSTAT) 0.4 MG SL tablet Place 0.4 mg under the tongue every 5 (five) minutes as needed.        Marland Kitchen omeprazole (PRILOSEC) 20 MG capsule Take 1 capsule (20 mg total) by mouth daily.  30 capsule  11  . pravastatin (PRAVACHOL) 20 MG tablet Take 1 tablet (20 mg total) by mouth daily.  30 tablet  11  . sildenafil (VIAGRA) 100 MG tablet Take 50-100 mg by mouth as needed.      . traZODone (DESYREL) 100 MG tablet Take 100-200 mg by mouth at bedtime.        . fluticasone (FLONASE) 50 MCG/ACT nasal spray Place 2 sprays into the nose daily.  16 g  2    Allergies  Allergen Reactions  . Atorvastatin     REACTION: muscle aches  . Simvastatin     REACTION: hand pain    Past Medical History  Diagnosis Date  . CAD (coronary artery disease)   . Hypertension   . GERD (gastroesophageal reflux disease)   . Hyperlipidemia   . ED (erectile dysfunction)   . Sleep disorder   . Hx of colonic polyps   . Impaired fasting glucose   . Wrist fracture      6 foot fall onto head, concussion, L wrist (comminuted with intra-articular extension) and 2nd finger (middle phalanx) fxs, vertigo    Past Surgical History  Procedure Date  . Branchial cleft cyst excision   . Hemorrhoid surgery   . Rhinoplasty     Family History  Problem Relation Age of Onset  . Coronary artery disease  Mother   . Coronary artery disease Brother   . Diabetes Neg Hx   . Cancer Brother     colon or rectal  . Stroke Mother     History   Social History  . Marital Status: Married    Spouse Name: N/A    Number of Children: 4  . Years of Education: N/A   Occupational History  . installs signs    Social History Main Topics  . Smoking status: Former Smoker    Quit date: 10/27/2005  . Smokeless tobacco: Never Used  . Alcohol Use: Yes     rare  . Drug Use: No  . Sexually Active: Not on file   Other Topics Concern  . Not on file   Social History Narrative  . No narrative on file   Review of Systems  Constitutional: Negative for fatigue and unexpected weight change.       Wears seat belt  HENT: Positive for congestion and rhinorrhea. Negative for hearing loss, dental problem and tinnitus.        Does okay with fluticasone for allergies Regular with dentist  Eyes: Negative for visual disturbance.  No diplopia or unilateral vision loss  Respiratory: Negative for cough, chest tightness and shortness of breath.   Cardiovascular: Positive for leg swelling. Negative for chest pain and palpitations.       Occ mild ankle edema at end of day  Gastrointestinal: Negative for nausea, vomiting, constipation and blood in stool.       Does get occ heartburn---uses omeprazole only prn Depends on diet  Genitourinary: Negative for dysuria, urgency, frequency and difficulty urinating.       No sexual problems  Musculoskeletal: Positive for arthralgias. Negative for back pain and joint swelling.       Some mild hand arthritis  Skin: Negative for rash.       No suspicious lesions Does get recurrent bumps---go away with cream and time  Neurological: Negative for dizziness, syncope, weakness, light-headedness, numbness and headaches.  Hematological: Negative for adenopathy. Does not bruise/bleed easily.  Psychiatric/Behavioral: Negative for disturbed wake/sleep cycle and dysphoric mood.  The patient is not nervous/anxious.        Objective:   Physical Exam  Constitutional: He is oriented to person, place, and time. He appears well-developed and well-nourished. No distress.  HENT:  Head: Normocephalic and atraumatic.  Right Ear: External ear normal.  Left Ear: External ear normal.  Mouth/Throat: Oropharynx is clear and moist. No oropharyngeal exudate.  Eyes: Conjunctivae and EOM are normal. Pupils are equal, round, and reactive to light.  Neck: Normal range of motion. Neck supple. No thyromegaly present.  Cardiovascular: Normal rate, regular rhythm, normal heart sounds and intact distal pulses.  Exam reveals no gallop.   No murmur heard. Pulmonary/Chest: Effort normal and breath sounds normal. No respiratory distress. He has no wheezes. He has no rales.  Abdominal: Soft. There is no tenderness.  Musculoskeletal: He exhibits no edema and no tenderness.       Slight decreased abduction of right shoulder---this caused the arm pain (minor though)----discussed heat, etc  Lymphadenopathy:    He has no cervical adenopathy.  Neurological: He is alert and oriented to person, place, and time.  Skin: No rash noted. No erythema.  Psychiatric: He has a normal mood and affect. His behavior is normal. Thought content normal.          Assessment & Plan:

## 2011-11-29 NOTE — Telephone Encounter (Signed)
Rx sent in to [facility].

## 2011-11-29 NOTE — Telephone Encounter (Signed)
Okay to send Rx for 1 year

## 2011-11-29 NOTE — Telephone Encounter (Signed)
[name] forgot to tell you that he needed an rx for his Metoprolol 50mg  called in to [facility].

## 2011-11-29 NOTE — Assessment & Plan Note (Signed)
Has been quiet On ASA, metoprolol and now statin

## 2012-02-03 ENCOUNTER — Other Ambulatory Visit: Payer: Self-pay | Admitting: *Deleted

## 2012-02-03 NOTE — Telephone Encounter (Signed)
Last filled 02/08/11

## 2012-02-04 MED ORDER — TRAZODONE HCL 100 MG PO TABS: 100.0000 mg | ORAL_TABLET | Freq: Every day | ORAL | Status: DC

## 2012-02-04 NOTE — Telephone Encounter (Signed)
rx sent to pharmacy by e-script  

## 2012-02-04 NOTE — Telephone Encounter (Signed)
Okay to refill for a year 

## 2012-03-12 ENCOUNTER — Telehealth: Payer: Self-pay | Admitting: Internal Medicine

## 2012-03-12 NOTE — Telephone Encounter (Signed)
Patient Information:  Caller Name: N/A  Phone: [phone]  Patient: [name], [name]  Gender: Male  DOB: 1955-09-26  Age: 56 Years  PCP: [facility])   Symptoms  Reason For Call & Symptoms: bilateral hand pain on pravastatin  Reviewed Health History In EMR: Yes  Reviewed Medications In EMR: Yes  Reviewed Allergies In EMR: Yes  Date of Onset of Symptoms: Unknown  Guideline(s) Used:  Hand and Wrist Pain  Disposition Per Guideline:   See Within 3 Days in Office  Reason For Disposition Reached:   Weakness or numbness in hand or fingers and present > 2 weeks  Advice Given:  N/A  Office Follow Up:  Does the office need to follow up with this patient?: No  Instructions For The Office: N/A  RN Note:  Patient c/o bilateral hand pain since restarting statin medication.  Does not tolerate Atorvastatin or Simvastatin, and was started on Pravastatin 6 months or so ago.

## 2012-03-13 ENCOUNTER — Ambulatory Visit: Payer: Self-pay | Admitting: Internal Medicine

## 2012-03-13 NOTE — Telephone Encounter (Signed)
.  left message to have patient return my call at home and cell number

## 2012-03-13 NOTE — Telephone Encounter (Signed)
Patient's wife notified as instructed by telephone. Pts wife requested appt today cancelled and pt will follow Dr Karle Starch instructions.

## 2012-03-13 NOTE — Telephone Encounter (Signed)
Please call him I know we spoke about this at his last visit Hand pain alone is not typical of reaction to cholesterol med but is still possible  He should stop the med for about a month If the hand pain goes away, we obviously won't keep him on it If it doesn't go away, he should restart it  Schedule appt if he worsens, has distinct hand weakness or trouble using his hands

## 2012-06-05 ENCOUNTER — Ambulatory Visit: Payer: PRIVATE HEALTH INSURANCE | Admitting: Internal Medicine

## 2012-06-12 ENCOUNTER — Ambulatory Visit: Payer: PRIVATE HEALTH INSURANCE | Admitting: Internal Medicine

## 2012-07-27 ENCOUNTER — Other Ambulatory Visit: Payer: Self-pay | Admitting: *Deleted

## 2012-07-27 MED ORDER — FLUTICASONE PROPIONATE 50 MCG/ACT NA SUSP: 2.0000 | Freq: Every day | NASAL | Status: DC

## 2012-11-23 ENCOUNTER — Other Ambulatory Visit: Payer: Self-pay | Admitting: *Deleted

## 2012-11-23 MED ORDER — FLUTICASONE PROPIONATE 50 MCG/ACT NA SUSP: 2.0000 | Freq: Every day | NASAL | Status: DC

## 2012-12-21 ENCOUNTER — Other Ambulatory Visit: Payer: Self-pay | Admitting: Internal Medicine

## 2012-12-25 ENCOUNTER — Ambulatory Visit (INDEPENDENT_AMBULATORY_CARE_PROVIDER_SITE_OTHER): Payer: PRIVATE HEALTH INSURANCE | Admitting: Internal Medicine

## 2012-12-25 ENCOUNTER — Encounter: Payer: Self-pay | Admitting: Internal Medicine

## 2012-12-25 VITALS — BP 120/80 | HR 65 | Temp 98.2°F | Wt 246.0 lb

## 2012-12-25 DIAGNOSIS — E785 Hyperlipidemia, unspecified: Secondary | ICD-10-CM

## 2012-12-25 DIAGNOSIS — R7301 Impaired fasting glucose: Secondary | ICD-10-CM

## 2012-12-25 DIAGNOSIS — I10 Essential (primary) hypertension: Secondary | ICD-10-CM

## 2012-12-25 DIAGNOSIS — I2510 Atherosclerotic heart disease of native coronary artery without angina pectoris: Secondary | ICD-10-CM

## 2012-12-25 DIAGNOSIS — G479 Sleep disorder, unspecified: Secondary | ICD-10-CM

## 2012-12-25 LAB — CBC WITH DIFFERENTIAL/PLATELET
Basophils Absolute: 0.1 10*3/uL (ref 0.0–0.1)
Basophils Relative: 0.7 % (ref 0.0–3.0)
Eosinophils Absolute: 0.5 10*3/uL (ref 0.0–0.7)
Eosinophils Relative: 6.6 % — ABNORMAL HIGH (ref 0.0–5.0)
HCT: 44.9 % (ref 39.0–52.0)
Hemoglobin: 15.3 g/dL (ref 13.0–17.0)
Lymphocytes Relative: 34.8 % (ref 12.0–46.0)
Lymphs Abs: 2.8 10*3/uL (ref 0.7–4.0)
MCHC: 34 g/dL (ref 30.0–36.0)
MCV: 89.7 fl (ref 78.0–100.0)
Monocytes Absolute: 0.7 10*3/uL (ref 0.1–1.0)
Monocytes Relative: 8.2 % (ref 3.0–12.0)
Neutro Abs: 4 10*3/uL (ref 1.4–7.7)
Neutrophils Relative %: 49.7 % (ref 43.0–77.0)
Platelets: 243 10*3/uL (ref 150.0–400.0)
RBC: 5 Mil/uL (ref 4.22–5.81)
RDW: 13.3 % (ref 11.5–14.6)
WBC: 8.1 10*3/uL (ref 4.5–10.5)

## 2012-12-25 LAB — BASIC METABOLIC PANEL
BUN: 12 mg/dL (ref 6–23)
CO2: 28 mEq/L (ref 19–32)
Calcium: 9.3 mg/dL (ref 8.4–10.5)
Chloride: 105 mEq/L (ref 96–112)
Creatinine, Ser: 0.9 mg/dL (ref 0.4–1.5)
GFR: 92.2 mL/min (ref >60.00)
Glucose, Bld: 111 mg/dL — ABNORMAL HIGH (ref 70–99)
Potassium: 4.2 mEq/L (ref 3.5–5.1)
Sodium: 138 mEq/L (ref 135–145)

## 2012-12-25 LAB — TSH: TSH: 0.95 u[IU]/mL (ref 0.35–5.50)

## 2012-12-25 LAB — LIPID PANEL
Cholesterol: 232 mg/dL — ABNORMAL HIGH (ref 0–200)
HDL: 47 mg/dL (ref >39.00)
Total CHOL/HDL Ratio: 5
Triglycerides: 122 mg/dL (ref 0.0–149.0)
VLDL: 24.4 mg/dL (ref 0.0–40.0)

## 2012-12-25 LAB — HEPATIC FUNCTION PANEL
ALT: 35 U/L (ref 0–53)
AST: 28 U/L (ref 0–37)
Albumin: 4 g/dL (ref 3.5–5.2)
Alkaline Phosphatase: 89 U/L (ref 39–117)
Bilirubin, Direct: 0.1 mg/dL (ref 0.0–0.3)
Total Bilirubin: 0.9 mg/dL (ref 0.3–1.2)
Total Protein: 7.4 g/dL (ref 6.0–8.3)

## 2012-12-25 LAB — LDL CHOLESTEROL, DIRECT: Direct LDL: 171 mg/dL

## 2012-12-25 MED ORDER — METOPROLOL TARTRATE 50 MG PO TABS: 50.0000 mg | ORAL_TABLET | Freq: Every day | ORAL | Status: DC

## 2012-12-25 NOTE — Progress Notes (Signed)
Subjective:    Patient ID: Elic Vencill, male    DOB: May 07, 1955, 57 y.o.   MRN: [id number]  HPI Doing well No new concerns Work has been busy  Not doing any activity other than work Tries to eat better Weight is stable  No chest pain  No SOB No dizziness or syncope No edema No palpitations  Reviewed CAD history Intolerant of 2 different statins---willing to try another  Only occ heartburn Uses the omeprazole rarely  Current Outpatient Prescriptions on File Prior to Visit  Medication Sig Dispense Refill  . aspirin 81 MG tablet Take 81 mg by mouth daily.        . fluticasone (FLONASE) 50 MCG/ACT nasal spray Place 2 sprays into the nose daily.  16 g  0  . nitroGLYCERIN (NITROSTAT) 0.4 MG SL tablet Place 0.4 mg under the tongue every 5 (five) minutes as needed.        . sildenafil (VIAGRA) 100 MG tablet Take 50-100 mg by mouth as needed.      . traZODone (DESYREL) 100 MG tablet Take 1-2 tablets (100-200 mg total) by mouth at bedtime.  60 tablet  11   No current facility-administered medications on file prior to visit.    Allergies  Allergen Reactions  . Atorvastatin     REACTION: muscle aches  . Simvastatin     REACTION: hand pain    Past Medical History  Diagnosis Date  . CAD (coronary artery disease)   . Hypertension   . GERD (gastroesophageal reflux disease)   . Hyperlipidemia   . ED (erectile dysfunction)   . Sleep disorder   . Hx of colonic polyps   . Impaired fasting glucose   . Wrist fracture      6 foot fall onto head, concussion, L wrist (comminuted with intra-articular extension) and 2nd finger (middle phalanx) fxs, vertigo    Past Surgical History  Procedure Laterality Date  . Branchial cleft cyst excision    . Hemorrhoid surgery    . Rhinoplasty      Family History  Problem Relation Age of Onset  . Coronary artery disease Mother   . Coronary artery disease Brother   . Diabetes Neg Hx   . Cancer Brother     colon or rectal  . Stroke  Mother     History   Social History  . Marital Status: Married    Spouse Name: N/A    Number of Children: 4  . Years of Education: N/A   Occupational History  . installs signs    Social History Main Topics  . Smoking status: Former Smoker    Quit date: 10/27/2005  . Smokeless tobacco: Never Used  . Alcohol Use: Yes     Comment: rare  . Drug Use: No  . Sexual Activity: Not on file   Other Topics Concern  . Not on file   Social History Narrative  . No narrative on file   Review of Systems No urinary problems-- no recurrence of the hematuria Rare bad night sleeping---uses the trazodone rarely Bowels are okay Appetite is fine    Objective:   Physical Exam  Constitutional: He appears well-developed and well-nourished. No distress.  Neck: Normal range of motion. Neck supple. No thyromegaly present.  Cardiovascular: Normal rate, regular rhythm, normal heart sounds and intact distal pulses.  Exam reveals no gallop.   No murmur heard. Pulmonary/Chest: Effort normal and breath sounds normal. No respiratory distress. He has no wheezes. He has no  rales.  Abdominal: Soft. There is no tenderness.  Musculoskeletal: He exhibits no edema and no tenderness.  Lymphadenopathy:    He has no cervical adenopathy.  Psychiatric: He has a normal mood and affect. His behavior is normal.          Assessment & Plan:

## 2012-12-25 NOTE — Assessment & Plan Note (Signed)
Has been quiet Intolerant of statins but willing to try again If LDL still >100, will try low dose lovastatin (20mg )

## 2012-12-25 NOTE — Assessment & Plan Note (Signed)
Has failed 3 statins Will try lovastatin if still high given CAD Takes fish oil

## 2012-12-25 NOTE — Assessment & Plan Note (Signed)
Rarely needs the med

## 2012-12-25 NOTE — Patient Instructions (Signed)
DASH Diet  The DASH diet stands for "Dietary Approaches to Stop Hypertension." It is a healthy eating plan that has been shown to reduce high blood pressure (hypertension) in as little as 14 days, while also possibly providing other significant health benefits. These other health benefits include reducing the risk of breast cancer after menopause and reducing the risk of type 2 diabetes, heart disease, colon cancer, and stroke. Health benefits also include weight loss and slowing kidney failure in patients with chronic kidney disease.   DIET GUIDELINES  · Limit salt (sodium). Your diet should contain less than 1500 mg of sodium daily.  · Limit refined or processed carbohydrates. Your diet should include mostly whole grains. Desserts and added sugars should be used sparingly.  · Include small amounts of heart-healthy fats. These types of fats include nuts, oils, and tub margarine. Limit saturated and trans fats. These fats have been shown to be harmful in the body.  CHOOSING FOODS   The following food groups are based on a 2000 calorie diet. See your Registered Dietitian for individual calorie needs.  Grains and Grain Products (6 to 8 servings daily)  · Eat More Often: Whole-wheat bread, brown rice, whole-grain or wheat pasta, quinoa, popcorn without added fat or salt (air popped).  · Eat Less Often: White bread, white pasta, white rice, cornbread.  Vegetables (4 to 5 servings daily)  · Eat More Often: Fresh, frozen, and canned vegetables. Vegetables may be raw, steamed, roasted, or grilled with a minimal amount of fat.  · Eat Less Often/Avoid: Creamed or fried vegetables. Vegetables in a cheese sauce.  Fruit (4 to 5 servings daily)  · Eat More Often: All fresh, canned (in natural juice), or frozen fruits. Dried fruits without added sugar. One hundred percent fruit juice (½ cup [237 mL] daily).  · Eat Less Often: Dried fruits with added sugar. Canned fruit in light or heavy syrup.  Lean Meats, Fish, and Poultry (2  servings or less daily. One serving is 3 to 4 oz [85-114 g]).  · Eat More Often: Ninety percent or leaner ground beef, tenderloin, sirloin. Round cuts of beef, chicken breast, turkey breast. All fish. Grill, bake, or broil your meat. Nothing should be fried.  · Eat Less Often/Avoid: Fatty cuts of meat, turkey, or chicken leg, thigh, or wing. Fried cuts of meat or fish.  Dairy (2 to 3 servings)  · Eat More Often: Low-fat or fat-free milk, low-fat plain or light yogurt, reduced-fat or part-skim cheese.  · Eat Less Often/Avoid: Milk (whole, 2%). Whole milk yogurt. Full-fat cheeses.  Nuts, Seeds, and Legumes (4 to 5 servings per week)  · Eat More Often: All without added salt.  · Eat Less Often/Avoid: Salted nuts and seeds, canned beans with added salt.  Fats and Sweets (limited)  · Eat More Often: Vegetable oils, tub margarines without trans fats, sugar-free gelatin. Mayonnaise and salad dressings.  · Eat Less Often/Avoid: Coconut oils, palm oils, butter, stick margarine, cream, half and half, cookies, candy, pie.  FOR MORE INFORMATION  The Dash Diet Eating Plan: www.dashdiet.org  Document Released: 04/04/2011 Document Revised: 07/08/2011 Document Reviewed: 04/04/2011  [employer]® Patient Information ©2014 [employer].

## 2012-12-25 NOTE — Assessment & Plan Note (Signed)
BP Readings from Last 3 Encounters:  12/25/12 120/80  11/29/11 126/75  11/18/11 138/80   Good control No changes needed

## 2012-12-30 ENCOUNTER — Encounter: Payer: Self-pay | Admitting: Family Medicine

## 2013-03-20 ENCOUNTER — Other Ambulatory Visit: Payer: Self-pay | Admitting: Internal Medicine

## 2013-03-24 ENCOUNTER — Other Ambulatory Visit: Payer: Self-pay

## 2013-03-24 NOTE — Telephone Encounter (Signed)
Pt request refill flonase,viagra and trazodone to [facility] pharmacy.Please advise.

## 2013-03-26 MED ORDER — SILDENAFIL CITRATE 100 MG PO TABS: 50.0000 mg | ORAL_TABLET | ORAL | Status: DC | PRN

## 2013-03-26 MED ORDER — TRAZODONE HCL 100 MG PO TABS: 100.0000 mg | ORAL_TABLET | Freq: Every day | ORAL | Status: DC

## 2013-03-26 MED ORDER — FLUTICASONE PROPIONATE 50 MCG/ACT NA SUSP: 2.0000 | Freq: Every day | NASAL | Status: DC

## 2013-03-26 NOTE — Telephone Encounter (Signed)
Sent, further refills from PCP.

## 2013-06-28 ENCOUNTER — Encounter: Payer: Self-pay | Admitting: Internal Medicine

## 2013-06-28 ENCOUNTER — Ambulatory Visit (INDEPENDENT_AMBULATORY_CARE_PROVIDER_SITE_OTHER): Payer: PRIVATE HEALTH INSURANCE | Admitting: Internal Medicine

## 2013-06-28 VITALS — BP 130/80 | HR 64 | Temp 98.4°F | Wt 251.0 lb

## 2013-06-28 DIAGNOSIS — J019 Acute sinusitis, unspecified: Secondary | ICD-10-CM | POA: Insufficient documentation

## 2013-06-28 MED ORDER — AMOXICILLIN 500 MG PO TABS: 1000.0000 mg | ORAL_TABLET | Freq: Two times a day (BID) | ORAL | Status: DC

## 2013-06-28 NOTE — Assessment & Plan Note (Signed)
Seems like it might just be viral at this point Discussed supportive care Rx for amoxicillin if worsens

## 2013-06-28 NOTE — Progress Notes (Signed)
Subjective:    Patient ID: [name], male    DOB: 16-Mar-1956, 58 y.o.   MRN: [id number]  HPI Started with a cold 3-4 days ago Now feels in his chest--pain in upper chest with cough Itchy throat Lots of cough--- dry Headache last night---frontal No fever, sweats or chills  No SOB Some nasal congestion and PND Slight ear pain  Tried "flu/cold" medicine Tried tylenol and guafenisin--- not clearly helpful  Current Outpatient Prescriptions on File Prior to Visit  Medication Sig Dispense Refill  . aspirin 81 MG tablet Take 81 mg by mouth daily.        . fluticasone (FLONASE) 50 MCG/ACT nasal spray Place 2 sprays into both nostrils daily.  16 g  0  . metoprolol (LOPRESSOR) 50 MG tablet Take 1 tablet (50 mg total) by mouth daily.  90 tablet  3  . nitroGLYCERIN (NITROSTAT) 0.4 MG SL tablet Place 0.4 mg under the tongue every 5 (five) minutes as needed.        Marland Kitchen omeprazole (PRILOSEC) 20 MG capsule TAKE ONE CAPSULE BY MOUTH DAILY  30 capsule  0  . sildenafil (VIAGRA) 100 MG tablet Take 0.5-1 tablets (50-100 mg total) by mouth as needed.  10 tablet  0  . traZODone (DESYREL) 100 MG tablet Take 1-2 tablets (100-200 mg total) by mouth at bedtime.  60 tablet  0   No current facility-administered medications on file prior to visit.    Allergies  Allergen Reactions  . Atorvastatin     REACTION: muscle aches  . Simvastatin     REACTION: hand pain  . Pravachol [Pravastatin Sodium]     Muscle aching    Past Medical History  Diagnosis Date  . CAD (coronary artery disease) 7/07    Cath for unstable angina--- 50-70% LAD, 40% circ  . Hypertension   . GERD (gastroesophageal reflux disease)   . Hyperlipidemia   . ED (erectile dysfunction)   . Sleep disorder   . Hx of colonic polyps   . Impaired fasting glucose   . Wrist fracture      6 foot fall onto head, concussion, L wrist (comminuted with intra-articular extension) and 2nd finger (middle phalanx) fxs, vertigo    Past Surgical  History  Procedure Laterality Date  . Branchial cleft cyst excision    . Hemorrhoid surgery    . Rhinoplasty      Family History  Problem Relation Age of Onset  . Coronary artery disease Mother   . Coronary artery disease Brother   . Diabetes Neg Hx   . Cancer Brother     colon or rectal  . Stroke Mother     History   Social History  . Marital Status: Married    Spouse Name: N/A    Number of Children: 4  . Years of Education: N/A   Occupational History  . installs signs    Social History Main Topics  . Smoking status: Former Smoker    Quit date: 10/27/2005  . Smokeless tobacco: Never Used  . Alcohol Use: Yes     Comment: rare  . Drug Use: No  . Sexual Activity: Not on file   Other Topics Concern  . Not on file   Social History Narrative  . No narrative on file   Review of Systems No rash No vomiting or diarrhea Appetite is okay     Objective:   Physical Exam  Constitutional: He appears well-developed and well-nourished. No distress.  HENT:  Mild frontal and maxillary tenderness Moderate nasal inflammation Mild pharyngeal injection without exudate TMs normal  Neck: Normal range of motion. Neck supple. No thyromegaly present.  Mildly tender anterior cervical nodes  Pulmonary/Chest: Effort normal and breath sounds normal. No respiratory distress. He has no wheezes. He has no rales.  Skin: No rash noted.          Assessment & Plan:

## 2013-08-18 ENCOUNTER — Other Ambulatory Visit: Payer: Self-pay | Admitting: Internal Medicine

## 2013-12-24 ENCOUNTER — Other Ambulatory Visit: Payer: Self-pay | Admitting: Family Medicine

## 2013-12-24 ENCOUNTER — Other Ambulatory Visit: Payer: Self-pay | Admitting: Internal Medicine

## 2013-12-27 ENCOUNTER — Encounter: Payer: Self-pay | Admitting: Internal Medicine

## 2013-12-27 ENCOUNTER — Ambulatory Visit (INDEPENDENT_AMBULATORY_CARE_PROVIDER_SITE_OTHER): Payer: PRIVATE HEALTH INSURANCE | Admitting: Internal Medicine

## 2013-12-27 VITALS — BP 120/70 | HR 60 | Temp 97.7°F | Ht 70.5 in | Wt 258.0 lb

## 2013-12-27 DIAGNOSIS — Z0000 Encounter for general adult medical examination without abnormal findings: Secondary | ICD-10-CM

## 2013-12-27 DIAGNOSIS — E785 Hyperlipidemia, unspecified: Secondary | ICD-10-CM

## 2013-12-27 DIAGNOSIS — I10 Essential (primary) hypertension: Secondary | ICD-10-CM

## 2013-12-27 DIAGNOSIS — G479 Sleep disorder, unspecified: Secondary | ICD-10-CM

## 2013-12-27 DIAGNOSIS — I2510 Atherosclerotic heart disease of native coronary artery without angina pectoris: Secondary | ICD-10-CM

## 2013-12-27 DIAGNOSIS — R7301 Impaired fasting glucose: Secondary | ICD-10-CM

## 2013-12-27 DIAGNOSIS — Z125 Encounter for screening for malignant neoplasm of prostate: Secondary | ICD-10-CM

## 2013-12-27 LAB — CBC WITH DIFFERENTIAL/PLATELET
Basophils Absolute: 0.1 10*3/uL (ref 0.0–0.1)
Basophils Relative: 1.2 % (ref 0.0–3.0)
Eosinophils Absolute: 1.2 10*3/uL — ABNORMAL HIGH (ref 0.0–0.7)
Eosinophils Relative: 12.2 % — ABNORMAL HIGH (ref 0.0–5.0)
HCT: 46.1 % (ref 39.0–52.0)
Hemoglobin: 15.1 g/dL (ref 13.0–17.0)
Lymphocytes Relative: 34.5 % (ref 12.0–46.0)
Lymphs Abs: 3.4 10*3/uL (ref 0.7–4.0)
MCHC: 32.8 g/dL (ref 30.0–36.0)
MCV: 91.8 fl (ref 78.0–100.0)
Monocytes Absolute: 0.9 10*3/uL (ref 0.1–1.0)
Monocytes Relative: 8.9 % (ref 3.0–12.0)
Neutro Abs: 4.2 10*3/uL (ref 1.4–7.7)
Neutrophils Relative %: 43.2 % (ref 43.0–77.0)
Platelets: 269 10*3/uL (ref 150.0–400.0)
RBC: 5.02 Mil/uL (ref 4.22–5.81)
RDW: 13.9 % (ref 11.5–15.5)
WBC: 9.8 10*3/uL (ref 4.0–10.5)

## 2013-12-27 LAB — COMPREHENSIVE METABOLIC PANEL
ALT: 40 U/L (ref 0–53)
AST: 33 U/L (ref 0–37)
Albumin: 3.8 g/dL (ref 3.5–5.2)
Alkaline Phosphatase: 98 U/L (ref 39–117)
BUN: 12 mg/dL (ref 6–23)
CO2: 30 mEq/L (ref 19–32)
Calcium: 9.4 mg/dL (ref 8.4–10.5)
Chloride: 103 mEq/L (ref 96–112)
Creatinine, Ser: 1 mg/dL (ref 0.4–1.5)
GFR: 77.76 mL/min (ref >60.00)
Glucose, Bld: 111 mg/dL — ABNORMAL HIGH (ref 70–99)
Potassium: 4.6 mEq/L (ref 3.5–5.1)
Sodium: 139 mEq/L (ref 135–145)
Total Bilirubin: 0.9 mg/dL (ref 0.2–1.2)
Total Protein: 7.1 g/dL (ref 6.0–8.3)

## 2013-12-27 LAB — LIPID PANEL
Cholesterol: 227 mg/dL — ABNORMAL HIGH (ref 0–200)
HDL: 48 mg/dL (ref >39.00)
LDL Cholesterol: 149 mg/dL — ABNORMAL HIGH (ref 0–99)
NonHDL: 179
Total CHOL/HDL Ratio: 5
Triglycerides: 152 mg/dL — ABNORMAL HIGH (ref 0.0–149.0)
VLDL: 30.4 mg/dL (ref 0.0–40.0)

## 2013-12-27 LAB — HEMOGLOBIN A1C: Hgb A1c MFr Bld: 6.6 % — ABNORMAL HIGH (ref 4.6–6.5)

## 2013-12-27 LAB — T4, FREE: Free T4: 0.98 ng/dL (ref 0.60–1.60)

## 2013-12-27 LAB — PSA: PSA: 0.46 ng/mL (ref 0.10–4.00)

## 2013-12-27 MED ORDER — OMEPRAZOLE 20 MG PO CPDR: 20.0000 mg | DELAYED_RELEASE_CAPSULE | Freq: Every day | ORAL | Status: DC

## 2013-12-27 MED ORDER — METOPROLOL TARTRATE 50 MG PO TABS: 50.0000 mg | ORAL_TABLET | Freq: Every day | ORAL | Status: DC

## 2013-12-27 MED ORDER — SILDENAFIL CITRATE 100 MG PO TABS: 50.0000 mg | ORAL_TABLET | ORAL | Status: DC | PRN

## 2013-12-27 MED ORDER — TRAZODONE HCL 100 MG PO TABS
100.0000 mg | ORAL_TABLET | Freq: Every evening | ORAL | Status: DC | PRN
Start: 2013-12-27 — End: 2015-09-06

## 2013-12-27 NOTE — Assessment & Plan Note (Signed)
Uses trazodone prn

## 2013-12-27 NOTE — Assessment & Plan Note (Signed)
Healthy lifestyle info given

## 2013-12-27 NOTE — Progress Notes (Signed)
Pre visit review using our clinic review tool, if applicable. No additional management support is needed unless otherwise documented below in the visit note. 

## 2013-12-27 NOTE — Assessment & Plan Note (Signed)
BP Readings from Last 3 Encounters:  12/27/13 120/70  06/28/13 130/80  12/25/12 120/80   Good control No ACEI since BP fairly low

## 2013-12-27 NOTE — Patient Instructions (Signed)
Please stop all drinks that have sugar!!  Exercise to Lose Weight Exercise and a healthy diet may help you lose weight. Your doctor may suggest specific exercises. EXERCISE IDEAS AND TIPS  Choose low-cost things you enjoy doing, such as walking, bicycling, or exercising to workout videos.  Take stairs instead of the elevator.  Walk during your lunch break.  Park your car further away from work or school.  Go to a gym or an exercise class.  Start with 5 to 10 minutes of exercise each day. Build up to 30 minutes of exercise 4 to 6 days a week.  Wear shoes with good support and comfortable clothes.  Stretch before and after working out.  Work out until you breathe harder and your heart beats faster.  Drink extra water when you exercise.  Do not do so much that you hurt yourself, feel dizzy, or get very short of breath. Exercises that burn about 150 calories:  Running 1  miles in 15 minutes.  Playing volleyball for 45 to 60 minutes.  Washing and waxing a car for 45 to 60 minutes.  Playing touch football for 45 minutes.  Walking 1  miles in 35 minutes.  Pushing a stroller 1  miles in 30 minutes.  Playing basketball for 30 minutes.  Raking leaves for 30 minutes.  Bicycling 5 miles in 30 minutes.  Walking 2 miles in 30 minutes.  Dancing for 30 minutes.  Shoveling snow for 15 minutes.  Swimming laps for 20 minutes.  Walking up stairs for 15 minutes.  Bicycling 4 miles in 15 minutes.  Gardening for 30 to 45 minutes.  Jumping rope for 15 minutes.  Washing windows or floors for 45 to 60 minutes. Document Released: 05/18/2010 Document Revised: 07/08/2011 Document Reviewed: 05/18/2010 [facility] Patient Information 2015 [place], [place]. This information is not intended to replace advice given to you by your health care provider. Make sure you discuss any questions you have with your health care provider. DASH Eating Plan DASH stands for "Dietary Approaches to  Stop Hypertension." The DASH eating plan is a healthy eating plan that has been shown to reduce high blood pressure (hypertension). Additional health benefits may include reducing the risk of type 2 diabetes mellitus, heart disease, and stroke. The DASH eating plan may also help with weight loss. WHAT DO I NEED TO KNOW ABOUT THE DASH EATING PLAN? For the DASH eating plan, you will follow these general guidelines:  Choose foods with a percent daily value for sodium of less than 5% (as listed on the food label).  Use salt-free seasonings or herbs instead of table salt or sea salt.  Check with your health care provider or pharmacist before using salt substitutes.  Eat lower-sodium products, often labeled as "lower sodium" or "no salt added."  Eat fresh foods.  Eat more vegetables, fruits, and low-fat dairy products.  Choose whole grains. Look for the word "whole" as the first word in the ingredient list.  Choose fish and skinless chicken or [place] more often than red meat. Limit fish, poultry, and meat to 6 oz (170 g) each day.  Limit sweets, desserts, sugars, and sugary drinks.  Choose heart-healthy fats.  Limit cheese to 1 oz (28 g) per day.  Eat more home-cooked food and less restaurant, buffet, and fast food.  Limit fried foods.  Cook foods using methods other than frying.  Limit canned vegetables. If you do use them, rinse them well to decrease the sodium.  When eating at a  restaurant, ask that your food be prepared with less salt, or no salt if possible. WHAT FOODS CAN I EAT? Seek help from a dietitian for individual calorie needs. Grains Whole grain or whole wheat bread. Brown rice. Whole grain or whole wheat pasta. Quinoa, bulgur, and whole grain cereals. Low-sodium cereals. Corn or whole wheat flour tortillas. Whole grain cornbread. Whole grain crackers. Low-sodium crackers. Vegetables Fresh or frozen vegetables (raw, steamed, roasted, or grilled). Low-sodium or  reduced-sodium tomato and vegetable juices. Low-sodium or reduced-sodium tomato sauce and paste. Low-sodium or reduced-sodium canned vegetables.  Fruits All fresh, canned (in natural juice), or frozen fruits. Meat and Other Protein Products Ground beef (85% or leaner), grass-fed beef, or beef trimmed of fat. Skinless chicken or [place]. Ground chicken or [place]. Pork trimmed of fat. All fish and seafood. Eggs. Dried beans, peas, or lentils. Unsalted nuts and seeds. Unsalted canned beans. Dairy Low-fat dairy products, such as skim or 1% milk, 2% or reduced-fat cheeses, low-fat ricotta or cottage cheese, or plain low-fat yogurt. Low-sodium or reduced-sodium cheeses. Fats and Oils Tub margarines without trans fats. Light or reduced-fat mayonnaise and salad dressings (reduced sodium). Avocado. Safflower, olive, or canola oils. Natural peanut or almond butter. Other Unsalted popcorn and pretzels. The items listed above may not be a complete list of recommended foods or beverages. Contact your dietitian for more options. WHAT FOODS ARE NOT RECOMMENDED? Grains White bread. White pasta. White rice. Refined cornbread. Bagels and croissants. Crackers that contain trans fat. Vegetables Creamed or fried vegetables. Vegetables in a cheese sauce. Regular canned vegetables. Regular canned tomato sauce and paste. Regular tomato and vegetable juices. Fruits Dried fruits. Canned fruit in light or heavy syrup. Fruit juice. Meat and Other Protein Products Fatty cuts of meat. Ribs, chicken wings, bacon, sausage, bologna, salami, chitterlings, fatback, hot dogs, bratwurst, and packaged luncheon meats. Salted nuts and seeds. Canned beans with salt. Dairy Whole or 2% milk, cream, half-and-half, and cream cheese. Whole-fat or sweetened yogurt. Full-fat cheeses or blue cheese. Nondairy creamers and whipped toppings. Processed cheese, cheese spreads, or cheese curds. Condiments Onion and garlic salt, seasoned salt,  table salt, and sea salt. Canned and packaged gravies. Worcestershire sauce. Tartar sauce. Barbecue sauce. Teriyaki sauce. Soy sauce, including reduced sodium. Steak sauce. Fish sauce. Oyster sauce. Cocktail sauce. Horseradish. Ketchup and mustard. Meat flavorings and tenderizers. Bouillon cubes. Hot sauce. Tabasco sauce. Marinades. Taco seasonings. Relishes. Fats and Oils Butter, stick margarine, lard, shortening, ghee, and bacon fat. Coconut, palm kernel, or palm oils. Regular salad dressings. Other Pickles and olives. Salted popcorn and pretzels. The items listed above may not be a complete list of foods and beverages to avoid. Contact your dietitian for more information. WHERE CAN I FIND MORE INFORMATION? National Heart, [employer], and [employer] Institute: travelstabloid.com Document Released: 04/04/2011 Document Revised: 08/30/2013 Document Reviewed: 02/17/2013 [facility] Patient Information 2015 [place], [place]. This information is not intended to replace advice given to you by your health care provider. Make sure you discuss any questions you have with your health care provider.

## 2013-12-27 NOTE — Assessment & Plan Note (Addendum)
No symptoms Keeps the nitro just in case--never uses (and knows not to use with sildenafil)

## 2013-12-27 NOTE — Assessment & Plan Note (Signed)
Failed multiple statins (side effects)

## 2013-12-27 NOTE — Progress Notes (Signed)
Subjective:    Patient ID: [name], male    DOB: 12/03/1955, 58 y.o.   MRN: [id number]  HPI Here for physical No new concerns  Discussed his weight Doesn't follow any eating plan Does use an exercise bike at times  No heart troubles  No chest pain, SOB, palpitations, dizziness  Current Outpatient Prescriptions on File Prior to Visit  Medication Sig Dispense Refill  . aspirin 81 MG tablet Take 81 mg by mouth daily.        . fluticasone (FLONASE) 50 MCG/ACT nasal spray PLACE 2 SPRAYS IN TO BOTH NOSTRILS DAILY  16 g  0  . metoprolol (LOPRESSOR) 50 MG tablet TAKE 1 TABLET BY MOUTH DAILY  30 tablet  0  . nitroGLYCERIN (NITROSTAT) 0.4 MG SL tablet Place 0.4 mg under the tongue every 5 (five) minutes as needed.        Marland Kitchen omeprazole (PRILOSEC) 20 MG capsule TAKE ONE CAPSULE BY MOUTH DAILY  30 capsule  0  . sildenafil (VIAGRA) 100 MG tablet Take 0.5-1 tablets (50-100 mg total) by mouth as needed.  10 tablet  0  . traZODone (DESYREL) 100 MG tablet Take 1-2 tablets (100-200 mg total) by mouth at bedtime.  60 tablet  0   No current facility-administered medications on file prior to visit.    Allergies  Allergen Reactions  . Atorvastatin     REACTION: muscle aches  . Simvastatin     REACTION: hand pain  . Pravachol [Pravastatin Sodium]     Muscle aching    Past Medical History  Diagnosis Date  . CAD (coronary artery disease) 7/07    Cath for unstable angina--- 50-70% LAD, 40% circ  . Hypertension   . GERD (gastroesophageal reflux disease)   . Hyperlipidemia   . ED (erectile dysfunction)   . Sleep disorder   . Hx of colonic polyps   . Impaired fasting glucose   . Wrist fracture      6 foot fall onto head, concussion, L wrist (comminuted with intra-articular extension) and 2nd finger (middle phalanx) fxs, vertigo    Past Surgical History  Procedure Laterality Date  . Branchial cleft cyst excision    . Hemorrhoid surgery    . Rhinoplasty      Family History  Problem  Relation Age of Onset  . Coronary artery disease Mother   . Coronary artery disease Brother   . Diabetes Neg Hx   . Cancer Brother     colon or rectal  . Stroke Mother     History   Social History  . Marital Status: Married    Spouse Name: N/A    Number of Children: 4  . Years of Education: N/A   Occupational History  . installs signs    Social History Main Topics  . Smoking status: Former Smoker    Quit date: 10/27/2005  . Smokeless tobacco: Never Used  . Alcohol Use: Yes     Comment: rare  . Drug Use: No  . Sexual Activity: Not on file   Other Topics Concern  . Not on file   Social History Narrative  . No narrative on file   Review of Systems  Constitutional: Negative for fatigue and unexpected weight change.       Wears seat belt  HENT: Negative for dental problem, hearing loss, tinnitus and trouble swallowing.        Regular with dentist   Eyes: Negative for visual disturbance.  No diplopia or unilateral vision loss  Respiratory: Negative for cough, chest tightness and shortness of breath.   Cardiovascular: Positive for leg swelling. Negative for chest pain and palpitations.       Has not needed nitro Some ankle edema at end of day  Gastrointestinal: Positive for constipation. Negative for nausea and vomiting.       Heartburn with lemon pie Sometimes strains for stools---occ blood on TP  Endocrine: Negative for polydipsia and polyphagia.  Genitourinary: Negative for urgency, frequency and difficulty urinating.       No sexual problems  Musculoskeletal: Positive for arthralgias.       Some pain in hands-- no meds  Skin: Negative for rash.       Gets scattered bumps on skin--go away with cream  Allergic/Immunologic: Negative for environmental allergies and immunocompromised state.  Neurological: Negative for dizziness, syncope, weakness, light-headedness, numbness and headaches.  Hematological: Negative for adenopathy. Bruises/bleeds easily.    Psychiatric/Behavioral: Negative for sleep disturbance and dysphoric mood. The patient is not nervous/anxious.        Objective:   Physical Exam  Constitutional: He is oriented to person, place, and time. He appears well-developed and well-nourished. No distress.  HENT:  Head: Normocephalic and atraumatic.  Right Ear: External ear normal.  Left Ear: External ear normal.  Mouth/Throat: Oropharynx is clear and moist. No oropharyngeal exudate.  Eyes: Conjunctivae and EOM are normal. Pupils are equal, round, and reactive to light.  Neck: Normal range of motion. Neck supple. No thyromegaly present.  Cardiovascular: Normal rate, regular rhythm, normal heart sounds and intact distal pulses.  Exam reveals no gallop.   No murmur heard. Pulmonary/Chest: Effort normal and breath sounds normal. No respiratory distress. He has no wheezes. He has no rales.  Abdominal: Soft. He exhibits no distension. There is no tenderness. There is no rebound and no guarding.  Musculoskeletal: He exhibits no edema and no tenderness.  Lymphadenopathy:    He has no cervical adenopathy.  Neurological: He is alert and oriented to person, place, and time.  Skin: No rash noted. No erythema.  Psychiatric: He has a normal mood and affect. His behavior is normal.          Assessment & Plan:

## 2013-12-27 NOTE — Assessment & Plan Note (Signed)
Healthy but really poor lifestyle. Stop sugared drinks, etc PSA after discussion Yearly flu shot

## 2013-12-28 ENCOUNTER — Encounter: Payer: Self-pay | Admitting: *Deleted

## 2014-04-28 ENCOUNTER — Encounter: Payer: Self-pay | Admitting: Family Medicine

## 2014-04-28 ENCOUNTER — Ambulatory Visit (INDEPENDENT_AMBULATORY_CARE_PROVIDER_SITE_OTHER): Payer: PRIVATE HEALTH INSURANCE | Admitting: Family Medicine

## 2014-04-28 VITALS — BP 124/82 | HR 54 | Temp 97.7°F | Wt 253.5 lb

## 2014-04-28 DIAGNOSIS — M722 Plantar fascial fibromatosis: Secondary | ICD-10-CM | POA: Insufficient documentation

## 2014-04-28 DIAGNOSIS — J0100 Acute maxillary sinusitis, unspecified: Secondary | ICD-10-CM

## 2014-04-28 DIAGNOSIS — J019 Acute sinusitis, unspecified: Secondary | ICD-10-CM | POA: Insufficient documentation

## 2014-04-28 MED ORDER — AMOXICILLIN-POT CLAVULANATE 875-125 MG PO TABS: 1.0000 | ORAL_TABLET | Freq: Two times a day (BID) | ORAL | Status: AC

## 2014-04-28 MED ORDER — NITROGLYCERIN 0.4 MG SL SUBL: 0.4000 mg | SUBLINGUAL_TABLET | SUBLINGUAL | Status: DC | PRN

## 2014-04-28 NOTE — Progress Notes (Signed)
Pre visit review using our clinic review tool, if applicable. No additional management support is needed unless otherwise documented below in the visit note. 

## 2014-04-28 NOTE — Patient Instructions (Signed)
You have a sinus infection. Take medicine as prescribed:augmentin course for 10 days Push fluids and plenty of rest. Nasal saline irrigation or neti pot to help drain sinuses. May use simple mucinex with plenty of fluid to help mobilize mucous. Let us know if fever >101.5, trouble opening/closing mouth, difficulty swallowing, or worsening - you may need to be seen again. For plantar fasciitis - let's start stretching exercises. Continue aleve, frozen water massage, and heel lift. This can take several months to get better.

## 2014-04-28 NOTE — Progress Notes (Signed)
BP 124/82 mmHg  Pulse 54  Temp(Src) 97.7 F (36.5 C) (Oral)  Wt 253 lb 8 oz (114.987 kg)  SpO2 94%   CC: URI, heel pain Subjective:    Patient ID: [name], male    DOB: Nov 26, 1955, 58 y.o.   MRN: [id number]  HPI: [name] is a 58 y.o. male presenting on 04/28/2014 for URI and Foot Pain   10d h/o cold - initially improving then worsening over last 2 days.  Initial ST. + Rhinorrhea. Dry cough, head congestion and sinus pressure. Blowing nose with yellow mucous.  No fevers/chills, chest congestion, ear or tooth pain,   Tried several OTC remedies without improvement.  Oldest son sick recently.  No smokers at home. No h/o asthma  R heel pain ongoing for last 3 weeks. Worse first few steps of day. Has tried frozen bottle water massage. Already takes aspirin in am and aleve at night. Actually improved with rest this week. Using heel lift.  Relevant past medical, surgical, family and social history reviewed and updated as indicated. Interim medical history since our last visit reviewed. Allergies and medications reviewed and updated.  Current Outpatient Prescriptions on File Prior to Visit  Medication Sig  . aspirin 81 MG tablet Take 81 mg by mouth daily.    . fluticasone (FLONASE) 50 MCG/ACT nasal spray PLACE 2 SPRAYS IN TO BOTH NOSTRILS DAILY  . metoprolol (LOPRESSOR) 50 MG tablet Take 1 tablet (50 mg total) by mouth daily.  Marland Kitchen omeprazole (PRILOSEC) 20 MG capsule Take 1 capsule (20 mg total) by mouth daily.  . sildenafil (VIAGRA) 100 MG tablet Take 0.5-1 tablets (50-100 mg total) by mouth as needed. (Patient not taking: Reported on 04/28/2014)  . traZODone (DESYREL) 100 MG tablet Take 1-2 tablets (100-200 mg total) by mouth at bedtime as needed for sleep. (Patient not taking: Reported on 04/28/2014)   No current facility-administered medications on file prior to visit.    Review of Systems Per HPI unless specifically indicated above     Objective:    BP 124/82 mmHg   Pulse 54  Temp(Src) 97.7 F (36.5 C) (Oral)  Wt 253 lb 8 oz (114.987 kg)  SpO2 94%  Wt Readings from Last 3 Encounters:  04/28/14 253 lb 8 oz (114.987 kg)  12/27/13 258 lb (117.028 kg)  06/28/13 251 lb (113.853 kg)    Physical Exam  Constitutional: He appears well-developed and well-nourished. No distress.  HENT:  Head: Normocephalic and atraumatic.  Right Ear: Hearing, tympanic membrane, external ear and ear canal normal.  Left Ear: Hearing, tympanic membrane, external ear and ear canal normal.  Nose: Mucosal edema (R>L) present. No rhinorrhea. Right sinus exhibits maxillary sinus tenderness. Right sinus exhibits no frontal sinus tenderness. Left sinus exhibits no maxillary sinus tenderness and no frontal sinus tenderness.  Mouth/Throat: Uvula is midline, oropharynx is clear and moist and mucous membranes are normal. No oropharyngeal exudate, posterior oropharyngeal edema, posterior oropharyngeal erythema or tonsillar abscesses.  Eyes: Conjunctivae and EOM are normal. Pupils are equal, round, and reactive to light. No scleral icterus.  Neck: Normal range of motion. Neck supple.  Cardiovascular: Normal rate, regular rhythm, normal heart sounds and intact distal pulses.   No murmur heard. Pulmonary/Chest: Effort normal and breath sounds normal. No respiratory distress. He has no wheezes. He has no rales.  Musculoskeletal: He exhibits no edema.  R heel tender to palpation  No palpable cords No achilles tenderness  Lymphadenopathy:    He has no cervical adenopathy.  Skin: Skin is warm and dry. No rash noted.  Nursing note and vitals reviewed.      Assessment & Plan:   Problem List Items Addressed This Visit    Plantar fasciitis, right    Already treating appropriately with NSAID, water bottle massage, and heel lift. rec add on stretching exercises and provided with handout Discussed expected long course of recovery with plantar fasciitis.    Acute sinusitis - Primary     Treat with augmentin course given progression of sxs and duration. Supportive care as per instructions. Red flags to return discussed. Update if not improving as expected.    Relevant Medications      amoxicillin-clavulanate (AUGMENTIN) tablet 875-125 mg       Follow up plan: No Follow-up on file.

## 2014-04-28 NOTE — Assessment & Plan Note (Signed)
Already treating appropriately with NSAID, water bottle massage, and heel lift. rec add on stretching exercises and provided with handout Discussed expected long course of recovery with plantar fasciitis.

## 2014-04-28 NOTE — Assessment & Plan Note (Signed)
Treat with augmentin course given progression of sxs and duration. Supportive care as per instructions. Red flags to return discussed. Update if not improving as expected.

## 2014-06-07 ENCOUNTER — Other Ambulatory Visit: Payer: Self-pay | Admitting: Family Medicine

## 2014-08-19 ENCOUNTER — Emergency Department (INDEPENDENT_AMBULATORY_CARE_PROVIDER_SITE_OTHER): Payer: PRIVATE HEALTH INSURANCE

## 2014-08-19 ENCOUNTER — Encounter (HOSPITAL_COMMUNITY): Payer: Self-pay | Admitting: *Deleted

## 2014-08-19 ENCOUNTER — Emergency Department (HOSPITAL_COMMUNITY)
Admission: EM | Admit: 2014-08-19 | Discharge: 2014-08-19 | Disposition: A | Discharge status: Home / Self Care | Payer: PRIVATE HEALTH INSURANCE | Source: Home / Self Care | Attending: Emergency Medicine | Admitting: Emergency Medicine

## 2014-08-19 DIAGNOSIS — R0789 Other chest pain: Secondary | ICD-10-CM

## 2014-08-19 DIAGNOSIS — S2341XA Sprain of ribs, initial encounter: Secondary | ICD-10-CM

## 2014-08-19 DIAGNOSIS — S29019A Strain of muscle and tendon of unspecified wall of thorax, initial encounter: Secondary | ICD-10-CM

## 2014-08-19 LAB — POCT URINALYSIS DIP (DEVICE)
Bilirubin Urine: NEGATIVE
Glucose, UA: NEGATIVE mg/dL
Hgb urine dipstick: NEGATIVE
Ketones, ur: NEGATIVE mg/dL
Leukocytes, UA: NEGATIVE
Nitrite: NEGATIVE
Protein, ur: NEGATIVE mg/dL
Specific Gravity, Urine: <=1.005 (ref 1.005–1.030)
Urobilinogen, UA: 0.2 mg/dL (ref 0.0–1.0)
pH: 5.5 (ref 5.0–8.0)

## 2014-08-19 MED ORDER — KETOROLAC TROMETHAMINE 60 MG/2ML IM SOLN
60.0000 mg | Freq: Once | INTRAMUSCULAR | Status: AC
  Administered 2014-08-19: 60 mg via INTRAMUSCULAR

## 2014-08-19 MED ORDER — KETOROLAC TROMETHAMINE 60 MG/2ML IM SOLN
INTRAMUSCULAR | Status: AC
  Filled 2014-08-19: qty 2

## 2014-08-19 NOTE — ED Notes (Signed)
Pt has  Pain  r  Side  Of  His  Back      /   Flank  Area      Developed  Today     Pais  Is   Worse  On  Movements  And  Certain posistions  And  Is  Worse  On  Deep breath     -  He  denys  Any  specefic injury  However  He  States  He    Lifts  A  Lot  At  Work      He  denys  Any  Nausea  /  Vomiting        Skin is  Warm  And  Dry

## 2014-08-19 NOTE — Discharge Instructions (Signed)
Chest Wall Pain Chest wall pain is pain in or around the bones and muscles of your chest. It may take up to 6 weeks to get better. It may take longer if you must stay physically active in your work and activities.  CAUSES  Chest wall pain may happen on its own. However, it may be caused by:  A viral illness like the flu.  Injury.  Coughing.  Exercise.  Arthritis.  Fibromyalgia.  Shingles. HOME CARE INSTRUCTIONS   Avoid overtiring physical activity. Try not to strain or perform activities that cause pain. This includes any activities using your chest or your abdominal and side muscles, especially if heavy weights are used.  Put ice on the sore area.  Put ice in a plastic bag.  Place a towel between your skin and the bag.  Leave the ice on for 15-20 minutes per hour while awake for the first 2 days.  Only take over-the-counter or prescription medicines for pain, discomfort, or fever as directed by your caregiver. SEEK IMMEDIATE MEDICAL CARE IF:   Your pain increases, or you are very uncomfortable.  You have a fever.  Your chest pain becomes worse.  You have new, unexplained symptoms.  You have nausea or vomiting.  You feel sweaty or lightheaded.  You have a cough with phlegm (sputum), or you cough up blood. MAKE SURE YOU:   Understand these instructions.  Will watch your condition.  Will get help right away if you are not doing well or get worse. Document Released: 04/15/2005 Document Revised: 07/08/2011 Document Reviewed: 12/10/2010 [facility] Patient Information 2015 [place], [place]. This information is not intended to replace advice given to you by your health care provider. Make sure you discuss any questions you have with your health care provider.  Musculoskeletal Pain Musculoskeletal pain is muscle and boney aches and pains. These pains can occur in any part of the body. Your caregiver may treat you without knowing the cause of the pain. They may treat you  if blood or urine tests, X-rays, and other tests were normal.  CAUSES There is often not a definite cause or reason for these pains. These pains may be caused by a type of germ (virus). The discomfort may also come from overuse. Overuse includes working out too hard when your body is not fit. Boney aches also come from weather changes. Bone is sensitive to atmospheric pressure changes. HOME CARE INSTRUCTIONS   Ask when your test results will be ready. Make sure you get your test results.  Only take over-the-counter or prescription medicines for pain, discomfort, or fever as directed by your caregiver. If you were given medications for your condition, do not drive, operate machinery or power tools, or sign legal documents for 24 hours. Do not drink alcohol. Do not take sleeping pills or other medications that may interfere with treatment.  Continue all activities unless the activities cause more pain. When the pain lessens, slowly resume normal activities. Gradually increase the intensity and duration of the activities or exercise.  During periods of severe pain, bed rest may be helpful. Lay or sit in any position that is comfortable.  Putting ice on the injured area.  Put ice in a bag.  Place a towel between your skin and the bag.  Leave the ice on for 15 to 20 minutes, 3 to 4 times a day.  Follow up with your caregiver for continued problems and no reason can be found for the pain. If the pain becomes worse  or does not go away, it may be necessary to repeat tests or do additional testing. Your caregiver may need to look further for a possible cause. SEEK IMMEDIATE MEDICAL CARE IF:  You have pain that is getting worse and is not relieved by medications.  You develop chest pain that is associated with shortness or breath, sweating, feeling sick to your stomach (nauseous), or throw up (vomit).  Your pain becomes localized to the abdomen.  You develop any new symptoms that seem different  or that concern you. MAKE SURE YOU:   Understand these instructions.  Will watch your condition.  Will get help right away if you are not doing well or get worse. Document Released: 04/15/2005 Document Revised: 07/08/2011 Document Reviewed: 12/18/2012 [facility] Patient Information 2015 [place], [place]. This information is not intended to replace advice given to you by your health care provider. Make sure you discuss any questions you have with your health care provider.

## 2014-08-19 NOTE — ED Provider Notes (Addendum)
CSN: [id number]     Arrival date & time 08/19/14  1828 History   First MD Initiated Contact with Patient 08/19/14 1850     Chief Complaint  Patient presents with  . Flank Pain   (Consider location/radiation/quality/duration/timing/severity/associated sxs/prior Treatment) HPI Comments: 59 year old obese male states he awoke this morning with right lateral chest wall soreness. 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 He noticed some pain at that time but did not become severe until after work. He is complaining of pain to the right lateral chest wall along the anterior and mid axillary line. There is tenderness along the right lateral chest wall and ribs. He denies any known injury or blunt trauma. He did not fall. The pain is worse with taking a deep breath or bilateral spinal flexion. No shortness of breath. Denies heaviness, tightness, pressure or squeezing discomfort. Denies nausea or diaphoresis.    Past Medical History  Diagnosis Date  . CAD (coronary artery disease) 7/07    Cath for unstable angina--- 50-70% LAD, 40% circ  . Hypertension   . GERD (gastroesophageal reflux disease)   . Hyperlipidemia   . ED (erectile dysfunction)   . Sleep disorder   . Hx of colonic polyps   . Impaired fasting glucose   . Wrist fracture      6 foot fall onto head, concussion, L wrist (comminuted with intra-articular extension) and 2nd finger (middle phalanx) fxs, vertigo   Past Surgical History  Procedure Laterality Date  . Branchial cleft cyst excision    . Hemorrhoid surgery    . Rhinoplasty     Family History  Problem Relation Age of Onset  . Coronary artery disease Mother   . Coronary artery disease Brother   . Diabetes Neg Hx   . Cancer Brother     colon or rectal  . Stroke Mother    History  Substance Use Topics  . Smoking status: Former Smoker    Quit date: 10/27/2005  . Smokeless tobacco: Never Used  . Alcohol Use: Yes     Comment: rare    Review of  Systems  Constitutional: Positive for activity change. Negative for fever.  HENT: Negative.   Respiratory: Negative for cough, chest tightness and shortness of breath.   Cardiovascular: Negative for palpitations and leg swelling.  Gastrointestinal: Negative.   Genitourinary: Negative.   Neurological: Negative.   All other systems reviewed and are negative.   Allergies  Atorvastatin; Simvastatin; and Pravachol  Home Medications   Prior to Admission medications   Medication Sig Start Date End Date Taking? Authorizing Provider  aspirin 81 MG tablet Take 81 mg by mouth daily.      Historical Provider, MD  fluticasone (FLONASE) 50 MCG/ACT nasal spray PLACE 2 SPRAYS IN TO BOTH NOSTRILS DAILY 12/24/13   Venia Carbon, MD  fluticasone [facility]) 50 MCG/ACT nasal spray PLACE 2 SPRAYS IN TO BOTH NOSTRILS DAILY 06/07/14   Tonia Ghent, MD  metoprolol (LOPRESSOR) 50 MG tablet Take 1 tablet (50 mg total) by mouth daily. 12/27/13   Venia Carbon, MD  nitroGLYCERIN (NITROSTAT) 0.4 MG SL tablet Place 1 tablet (0.4 mg total) under the tongue every 5 (five) minutes as needed. 04/28/14   Ria Bush, MD  omeprazole (PRILOSEC) 20 MG capsule Take 1 capsule (20 mg total) by mouth daily. 12/27/13   Venia Carbon, MD  sildenafil (VIAGRA) 100 MG tablet Take 0.5-1 tablets (50-100 mg total) by mouth as needed. Patient not taking:  Reported on 04/28/2014 12/27/13   Venia Carbon, MD  traZODone (DESYREL) 100 MG tablet Take 1-2 tablets (100-200 mg total) by mouth at bedtime as needed for sleep. Patient not taking: Reported on 04/28/2014 12/27/13   Venia Carbon, MD   BP 158/80 mmHg  Pulse 78  Temp(Src) 98.6 F (37 C) (Oral)  Resp 18  SpO2 96% Physical Exam  Constitutional: He is oriented to person, place, and time. He appears well-developed and well-nourished. No distress.  Neck: Normal range of motion. Neck supple.  Cardiovascular: Normal rate, regular rhythm and normal heart sounds.    Pulmonary/Chest: Effort normal and breath sounds normal. No respiratory distress. He has no wheezes. He has no rales. He exhibits tenderness.  There is marked chest wall and rib tenderness reproducing the pain for which he presents.  Abdominal: Soft. He exhibits no distension and no mass. There is no rebound and no guarding.  Tenderness to the epigastrium and right upper quadrant.  Neurological: He is alert and oriented to person, place, and time.  Skin: Skin is warm and dry.  Psychiatric: He has a normal mood and affect.  Nursing note and vitals reviewed.   ED Course  Procedures (including critical care time) Labs Review Labs Reviewed  POCT URINALYSIS DIP (DEVICE)   ED ECG REPORT   Date: 08/19/2014  Rate: 70  Rhythm: NSR  QRS Axis:   Intervals: PR 190  ST/T Wave abnormalities:   Conduction Disutrbances:R BBB.   No ectopy.  Narrative Interpretation:   Old EKG Reviewed: 2007. NSR Normal EKG.  I have personally reviewed the EKG tracing and agree with the computerized printout as noted.  Imaging Review Dg Ribs Unilateral W/chest Right  08/19/2014   CLINICAL DATA:  Acute right rib pain after doing lifting work today.  EXAM: RIGHT RIBS AND CHEST - 3+ VIEW  COMPARISON:  None.  FINDINGS: No fracture or dislocation are seen involving the ribs. There is no evidence of pneumothorax or pleural effusion. Both lungs are clear. Heart size and mediastinal contours are within normal limits.  IMPRESSION: No acute fracture or dislocation of right ribs.   Electronically Signed   By: Abelardo Diesel M.D.   On: 08/19/2014 20:12     MDM   1. Chest wall pain   2. Sprain and strain of ribs, initial encounter    Ice aleve Toradol 60 mg IM Urine is clear, no hematuria EKG NSR with RBBB. No clinical signs of cardiopulmonary etiology at this time. He does have CAD   Janne Napoleon, NP 08/19/14 2035  Janne Napoleon, NP 08/22/14 [phone]

## 2014-08-25 ENCOUNTER — Telehealth: Payer: Self-pay | Admitting: Internal Medicine

## 2014-08-25 NOTE — Telephone Encounter (Signed)
Yes if pt is feeling fine, ok to r/s

## 2014-08-25 NOTE — Telephone Encounter (Signed)
Pt had appointment with dr Silvio Pate Friday.  Spouse called to cancel pt out of town.  She r/s to 5/6  Is it ok to wait  See below reason for visit  urgent care follow up, poss heart right side bundle blockage

## 2014-08-26 ENCOUNTER — Ambulatory Visit: Payer: PRIVATE HEALTH INSURANCE | Admitting: Internal Medicine

## 2014-09-02 ENCOUNTER — Encounter: Payer: Self-pay | Admitting: Internal Medicine

## 2014-09-02 ENCOUNTER — Ambulatory Visit (INDEPENDENT_AMBULATORY_CARE_PROVIDER_SITE_OTHER): Payer: PRIVATE HEALTH INSURANCE | Admitting: Internal Medicine

## 2014-09-02 VITALS — BP 130/80 | HR 60 | Temp 98.3°F | Wt 253.0 lb

## 2014-09-02 DIAGNOSIS — I4510 Unspecified right bundle-branch block: Secondary | ICD-10-CM

## 2014-09-02 DIAGNOSIS — R109 Unspecified abdominal pain: Secondary | ICD-10-CM | POA: Insufficient documentation

## 2014-09-02 DIAGNOSIS — R072 Precordial pain: Secondary | ICD-10-CM | POA: Insufficient documentation

## 2014-09-02 NOTE — Assessment & Plan Note (Signed)
Tender with sternal pressure and with deep breath May be soft tissue also--or could be GERD Will have him take the omeprazole for 6 weeks in case this could be some esophagitis--then prn again

## 2014-09-02 NOTE — Assessment & Plan Note (Signed)
Clearly seems to be along the chest wall---- soft tissue Okay to continue aleve reassured

## 2014-09-02 NOTE — Assessment & Plan Note (Signed)
PA at urgent care was very concerned but I am not Has known CAD but no symptoms and not severe Hasn't seen Dr Tamala Julian in some time---will send to him and see if he needs further evaluation due to this new finding

## 2014-09-02 NOTE — Progress Notes (Signed)
Subjective:    Patient ID: [name], male    DOB: 04/03/1956, 59 y.o.   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 aspirin 81 MG tablet Take 81 mg by mouth daily.      . fluticasone (FLONASE) 50 MCG/ACT nasal spray PLACE 2 SPRAYS IN TO BOTH NOSTRILS DAILY 16 g 6  . metoprolol (LOPRESSOR) 50 MG tablet Take 1 tablet (50 mg total) by mouth daily. 90 tablet 3  . nitroGLYCERIN (NITROSTAT) 0.4 MG SL tablet Place 1 tablet (0.4 mg total) under the tongue every 5 (five) minutes as needed. 30 tablet 0  . omeprazole (PRILOSEC) 20 MG capsule Take 1 capsule (20 mg total) by mouth daily. 90 capsule 3   No current facility-administered medications on file prior to visit.    Allergies  Allergen Reactions  . Atorvastatin     REACTION: muscle aches  . Simvastatin     REACTION: hand pain  . Pravachol [Pravastatin Sodium]     Muscle aching    Past Medical History  Diagnosis Date  . CAD (coronary artery disease) 7/07    Cath for unstable angina--- 50-70% LAD, 40% circ  . Hypertension   . GERD (gastroesophageal reflux disease)   . Hyperlipidemia   . ED (erectile dysfunction)   . Sleep disorder   . Hx of colonic polyps   . Impaired fasting glucose   . Wrist fracture      6 foot fall onto head, concussion, L wrist (comminuted with intra-articular extension) and 2nd finger (middle phalanx) fxs, vertigo    Past Surgical History  Procedure Laterality  Date  . Branchial cleft cyst excision    . Hemorrhoid surgery    . Rhinoplasty      Family History  Problem Relation Age of Onset  . Coronary artery disease Mother   . Coronary artery disease Brother   . Diabetes Neg Hx   . Cancer Brother     colon or rectal  . Stroke Mother     History   Social History  . Marital Status: Married    Spouse Name: N/A  . Number of Children: 4  . Years of Education: N/A   Occupational History  . installs signs    Social History Main Topics  . Smoking status: Former Smoker    Quit date: 10/27/2005  . Smokeless tobacco: Never Used  . Alcohol Use: Yes     Comment: rare  . Drug Use: No  . Sexual Activity: Not on file   Other Topics Concern  . Not on file   Social History Narrative   Review of Systems  Appetite is fine Weight stable No swallowing problems     Objective:   Physical Exam  Constitutional: He appears well-developed and well-nourished. No distress.  Neck: Normal range of motion. Neck supple. No thyromegaly present.  Cardiovascular: Normal rate, regular rhythm and normal heart sounds.  Exam reveals no gallop.   No murmur heard. Pulmonary/Chest: Effort normal and breath sounds normal. No respiratory distress. He has no wheezes. He has no rales.  Right rib tenderness along T8-12 Some lower substernal tenderness--also pain with deep breath   Abdominal: Soft. He exhibits no distension. There is no rebound and no guarding.  Slight epigastric tenderness  Musculoskeletal: He exhibits no edema.  Lymphadenopathy:    He has no cervical adenopathy.          Assessment & Plan:

## 2014-09-02 NOTE — Patient Instructions (Signed)
Please take the omeprazole (prilosec) daily for 6 weeks. If you feel fine then, you can go back to just using it as needed.

## 2014-12-30 ENCOUNTER — Encounter: Payer: PRIVATE HEALTH INSURANCE | Admitting: Internal Medicine

## 2015-01-13 ENCOUNTER — Other Ambulatory Visit: Payer: Self-pay | Admitting: Internal Medicine

## 2015-01-20 ENCOUNTER — Encounter: Payer: Self-pay | Admitting: Internal Medicine

## 2015-01-20 ENCOUNTER — Ambulatory Visit (INDEPENDENT_AMBULATORY_CARE_PROVIDER_SITE_OTHER): Payer: PRIVATE HEALTH INSURANCE | Admitting: Internal Medicine

## 2015-01-20 ENCOUNTER — Telehealth: Payer: Self-pay | Admitting: Internal Medicine

## 2015-01-20 VITALS — BP 118/64 | HR 66 | Temp 98.1°F | Ht 70.75 in | Wt 258.0 lb

## 2015-01-20 DIAGNOSIS — I2510 Atherosclerotic heart disease of native coronary artery without angina pectoris: Secondary | ICD-10-CM

## 2015-01-20 DIAGNOSIS — K219 Gastro-esophageal reflux disease without esophagitis: Secondary | ICD-10-CM

## 2015-01-20 DIAGNOSIS — Z0000 Encounter for general adult medical examination without abnormal findings: Secondary | ICD-10-CM

## 2015-01-20 DIAGNOSIS — R7301 Impaired fasting glucose: Secondary | ICD-10-CM

## 2015-01-20 DIAGNOSIS — I10 Essential (primary) hypertension: Secondary | ICD-10-CM | POA: Diagnosis not present

## 2015-01-20 DIAGNOSIS — E785 Hyperlipidemia, unspecified: Secondary | ICD-10-CM

## 2015-01-20 LAB — CBC WITH DIFFERENTIAL/PLATELET
Basophils Absolute: 0.1 10*3/uL (ref 0.0–0.1)
Basophils Relative: 0.8 % (ref 0.0–3.0)
Eosinophils Absolute: 0.9 10*3/uL — ABNORMAL HIGH (ref 0.0–0.7)
Eosinophils Relative: 10.3 % — ABNORMAL HIGH (ref 0.0–5.0)
HCT: 45.2 % (ref 39.0–52.0)
Hemoglobin: 15 g/dL (ref 13.0–17.0)
Lymphocytes Relative: 33.1 % (ref 12.0–46.0)
Lymphs Abs: 2.8 10*3/uL (ref 0.7–4.0)
MCHC: 33.2 g/dL (ref 30.0–36.0)
MCV: 89.9 fl (ref 78.0–100.0)
Monocytes Absolute: 0.8 10*3/uL (ref 0.1–1.0)
Monocytes Relative: 9.1 % (ref 3.0–12.0)
Neutro Abs: 3.9 10*3/uL (ref 1.4–7.7)
Neutrophils Relative %: 46.7 % (ref 43.0–77.0)
Platelets: 252 10*3/uL (ref 150.0–400.0)
RBC: 5.03 Mil/uL (ref 4.22–5.81)
RDW: 13.8 % (ref 11.5–15.5)
WBC: 8.4 10*3/uL (ref 4.0–10.5)

## 2015-01-20 LAB — COMPREHENSIVE METABOLIC PANEL
ALT: 48 U/L (ref 0–53)
AST: 31 U/L (ref 0–37)
Albumin: 4 g/dL (ref 3.5–5.2)
Alkaline Phosphatase: 116 U/L (ref 39–117)
BUN: 16 mg/dL (ref 6–23)
CO2: 29 mEq/L (ref 19–32)
Calcium: 9.4 mg/dL (ref 8.4–10.5)
Chloride: 103 mEq/L (ref 96–112)
Creatinine, Ser: 0.98 mg/dL (ref 0.40–1.50)
GFR: 82.97 mL/min (ref >60.00)
Glucose, Bld: 142 mg/dL — ABNORMAL HIGH (ref 70–99)
Potassium: 4.2 mEq/L (ref 3.5–5.1)
Sodium: 138 mEq/L (ref 135–145)
Total Bilirubin: 0.9 mg/dL (ref 0.2–1.2)
Total Protein: 7.2 g/dL (ref 6.0–8.3)

## 2015-01-20 LAB — LIPID PANEL
Cholesterol: 209 mg/dL — ABNORMAL HIGH (ref 0–200)
HDL: 50.6 mg/dL (ref >39.00)
LDL Cholesterol: 138 mg/dL — ABNORMAL HIGH (ref 0–99)
NonHDL: 158.64
Total CHOL/HDL Ratio: 4
Triglycerides: 104 mg/dL (ref 0.0–149.0)
VLDL: 20.8 mg/dL (ref 0.0–40.0)

## 2015-01-20 LAB — HEMOGLOBIN A1C: Hgb A1c MFr Bld: 6.8 % — ABNORMAL HIGH (ref 4.6–6.5)

## 2015-01-20 MED ORDER — FLUTICASONE PROPIONATE 50 MCG/ACT NA SUSP: NASAL | Status: DC

## 2015-01-20 MED ORDER — OMEPRAZOLE 20 MG PO CPDR: 20.0000 mg | DELAYED_RELEASE_CAPSULE | Freq: Every day | ORAL | Status: DC

## 2015-01-20 MED ORDER — NITROGLYCERIN 0.4 MG SL SUBL: 0.4000 mg | SUBLINGUAL_TABLET | SUBLINGUAL | Status: DC | PRN

## 2015-01-20 MED ORDER — ASPIRIN 81 MG PO TABS: 81.0000 mg | ORAL_TABLET | Freq: Every day | ORAL | Status: DC

## 2015-01-20 MED ORDER — METOPROLOL TARTRATE 50 MG PO TABS: 50.0000 mg | ORAL_TABLET | Freq: Every day | ORAL | Status: DC

## 2015-01-20 NOTE — Addendum Note (Signed)
Addended byCloyd Stagers B on: 01/20/2015 08:20 AM   Modules accepted: Orders, SmartSet

## 2015-01-20 NOTE — Assessment & Plan Note (Signed)
BP Readings from Last 3 Encounters:  01/20/15 118/64  09/02/14 130/80  08/19/14 158/80   Good control

## 2015-01-20 NOTE — Assessment & Plan Note (Signed)
Healthy but out of shape Discussed fitness Defer PSA to at least next year Colon due 2018 Gets flu vaccine at work

## 2015-01-20 NOTE — Progress Notes (Signed)
Subjective:    Patient ID: [name], male    DOB: 11-29-1955, 59 y.o.   MRN: [id number]  HPI Here for physical  He has some right arm pain Notices it at night in bed--also if he "turns my arm wrong" Doesn't limit his activities Normal ROM but feels it with full abduction of shoulder Tylenol may help some  Not much exercise--work schedule is tough Weight is stable  Flank pain from last visit gone Still on acid blocker  Current Outpatient Prescriptions on File Prior to Visit  Medication Sig Dispense Refill  . aspirin 81 MG tablet Take 81 mg by mouth daily.      . fluticasone (FLONASE) 50 MCG/ACT nasal spray PLACE 2 SPRAYS IN TO BOTH NOSTRILS DAILY 16 g 6  . metoprolol (LOPRESSOR) 50 MG tablet TAKE 1 TABLET BY MOUTH DAILY 90 tablet 0  . nitroGLYCERIN (NITROSTAT) 0.4 MG SL tablet Place 1 tablet (0.4 mg total) under the tongue every 5 (five) minutes as needed. 30 tablet 0  . omeprazole (PRILOSEC) 20 MG capsule TAKE ONE CAPSULE BY MOUTH DAILY 90 capsule 0   No current facility-administered medications on file prior to visit.    Allergies  Allergen Reactions  . Atorvastatin     REACTION: muscle aches  . Simvastatin     REACTION: hand pain  . Pravachol [Pravastatin Sodium]     Muscle aching    Past Medical History  Diagnosis Date  . CAD (coronary artery disease) 7/07    Cath for unstable angina--- 50-70% LAD, 40% circ  . Hypertension   . GERD (gastroesophageal reflux disease)   . Hyperlipidemia   . ED (erectile dysfunction)   . Sleep disorder   . Hx of colonic polyps   . Impaired fasting glucose   . Wrist fracture      6 foot fall onto head, concussion, L wrist (comminuted with intra-articular extension) and 2nd finger (middle phalanx) fxs, vertigo    Past Surgical History  Procedure Laterality Date  . Branchial cleft cyst excision    . Hemorrhoid surgery    . Rhinoplasty      Family History  Problem Relation Age of Onset  . Coronary artery disease Mother    . Coronary artery disease Brother   . Diabetes Neg Hx   . Cancer Brother     colon or rectal  . Stroke Mother     Social History   Social History  . Marital Status: Married    Spouse Name: N/A  . Number of Children: 4  . Years of Education: N/A   Occupational History  . installs signs    Social History Main Topics  . Smoking status: Former Smoker    Quit date: 10/27/2005  . Smokeless tobacco: Never Used  . Alcohol Use: Yes     Comment: rare  . Drug Use: No  . Sexual Activity: Not on file   Other Topics Concern  . Not on file   Social History Narrative   Review of Systems  Constitutional: Negative for fatigue and unexpected weight change.       Wears seat belt  HENT: Negative for dental problem, hearing loss, tinnitus and trouble swallowing.        Keeps up with dentist  Eyes: Negative for visual disturbance.       No diplopia or unilateral vision loss  Respiratory: Negative for cough, chest tightness and shortness of breath.   Cardiovascular: Positive for leg swelling. Negative for chest pain  and palpitations.       Ankle swelling Better in AM  Gastrointestinal: Negative for nausea, vomiting, abdominal pain and constipation.       No heartburn on PPI Occasional blood on toilet paper--past hemorrhoid surgery  Endocrine: Negative for polydipsia and polyuria.  Genitourinary: Negative for urgency, frequency and difficulty urinating.       No sig ED  Musculoskeletal: Positive for arthralgias. Negative for back pain and joint swelling.       Just right arm with some weakness  Skin: Negative for rash.       No suspicious lesions--but wife wants spot on back checked  Allergic/Immunologic: Positive for environmental allergies. Negative for immunocompromised state.       Controlled with flonase and OTC antihistamine  Neurological: Negative for dizziness, syncope, light-headedness and headaches.  Hematological: Negative for adenopathy. Bruises/bleeds easily.    Psychiatric/Behavioral: Negative for sleep disturbance and dysphoric mood. The patient is not nervous/anxious.        Snores and wife concerned for apnea--but no daytime somnolence       Objective:   Physical Exam  Constitutional: He is oriented to person, place, and time. He appears well-developed and well-nourished. No distress.  HENT:  Head: Normocephalic and atraumatic.  Right Ear: External ear normal.  Left Ear: External ear normal.  Mouth/Throat: Oropharynx is clear and moist. No oropharyngeal exudate.  Eyes: Conjunctivae and EOM are normal. Pupils are equal, round, and reactive to light.  Neck: Normal range of motion. Neck supple. No thyromegaly present.  Cardiovascular: Normal rate, regular rhythm, normal heart sounds and intact distal pulses.  Exam reveals no gallop.   No murmur heard. Pulmonary/Chest: Effort normal and breath sounds normal. No respiratory distress. He has no wheezes. He has no rales.  Abdominal: Soft. There is no tenderness.  Musculoskeletal: He exhibits no edema or tenderness.  Fair ROM at shoulder Pain localized by triceps tendon at elbow on right--discussed ice, etc  Lymphadenopathy:    He has no cervical adenopathy.  Neurological: He is alert and oriented to person, place, and time.  Skin: No rash noted. No erythema.  Benign keratosis on back  Psychiatric: He has a normal mood and affect. His behavior is normal.          Assessment & Plan:

## 2015-01-20 NOTE — Assessment & Plan Note (Signed)
Quiet now Can try to cut back on PPI

## 2015-01-20 NOTE — Assessment & Plan Note (Signed)
Really needs to lose weight Check A1c

## 2015-01-20 NOTE — Patient Instructions (Signed)
You can try cutting back on omeprazole to every other day.  DASH Eating Plan DASH stands for "Dietary Approaches to Stop Hypertension." The DASH eating plan is a healthy eating plan that has been shown to reduce high blood pressure (hypertension). Additional health benefits may include reducing the risk of type 2 diabetes mellitus, heart disease, and stroke. The DASH eating plan may also help with weight loss. WHAT DO I NEED TO KNOW ABOUT THE DASH EATING PLAN? For the DASH eating plan, you will follow these general guidelines:  Choose foods with a percent daily value for sodium of less than 5% (as listed on the food label).  Use salt-free seasonings or herbs instead of table salt or sea salt.  Check with your health care provider or pharmacist before using salt substitutes.  Eat lower-sodium products, often labeled as "lower sodium" or "no salt added."  Eat fresh foods.  Eat more vegetables, fruits, and low-fat dairy products.  Choose whole grains. Look for the word "whole" as the first word in the ingredient list.  Choose fish and skinless chicken or [place] more often than red meat. Limit fish, poultry, and meat to 6 oz (170 g) each day.  Limit sweets, desserts, sugars, and sugary drinks.  Choose heart-healthy fats.  Limit cheese to 1 oz (28 g) per day.  Eat more home-cooked food and less restaurant, buffet, and fast food.  Limit fried foods.  Cook foods using methods other than frying.  Limit canned vegetables. If you do use them, rinse them well to decrease the sodium.  When eating at a restaurant, ask that your food be prepared with less salt, or no salt if possible. WHAT FOODS CAN I EAT? Seek help from a dietitian for individual calorie needs. Grains Whole grain or whole wheat bread. Brown rice. Whole grain or whole wheat pasta. Quinoa, bulgur, and whole grain cereals. Low-sodium cereals. Corn or whole wheat flour tortillas. Whole grain cornbread. Whole grain crackers.  Low-sodium crackers. Vegetables Fresh or frozen vegetables (raw, steamed, roasted, or grilled). Low-sodium or reduced-sodium tomato and vegetable juices. Low-sodium or reduced-sodium tomato sauce and paste. Low-sodium or reduced-sodium canned vegetables.  Fruits All fresh, canned (in natural juice), or frozen fruits. Meat and Other Protein Products Ground beef (85% or leaner), grass-fed beef, or beef trimmed of fat. Skinless chicken or [place]. Ground chicken or [place]. Pork trimmed of fat. All fish and seafood. Eggs. Dried beans, peas, or lentils. Unsalted nuts and seeds. Unsalted canned beans. Dairy Low-fat dairy products, such as skim or 1% milk, 2% or reduced-fat cheeses, low-fat ricotta or cottage cheese, or plain low-fat yogurt. Low-sodium or reduced-sodium cheeses. Fats and Oils Tub margarines without trans fats. Light or reduced-fat mayonnaise and salad dressings (reduced sodium). Avocado. Safflower, olive, or canola oils. Natural peanut or almond butter. Other Unsalted popcorn and pretzels. The items listed above may not be a complete list of recommended foods or beverages. Contact your dietitian for more options. WHAT FOODS ARE NOT RECOMMENDED? Grains White bread. White pasta. White rice. Refined cornbread. Bagels and croissants. Crackers that contain trans fat. Vegetables Creamed or fried vegetables. Vegetables in a cheese sauce. Regular canned vegetables. Regular canned tomato sauce and paste. Regular tomato and vegetable juices. Fruits Dried fruits. Canned fruit in light or heavy syrup. Fruit juice. Meat and Other Protein Products Fatty cuts of meat. Ribs, chicken wings, bacon, sausage, bologna, salami, chitterlings, fatback, hot dogs, bratwurst, and packaged luncheon meats. Salted nuts and seeds. Canned beans with salt. Dairy Whole or  2% milk, cream, half-and-half, and cream cheese. Whole-fat or sweetened yogurt. Full-fat cheeses or blue cheese. Nondairy creamers and whipped  toppings. Processed cheese, cheese spreads, or cheese curds. Condiments Onion and garlic salt, seasoned salt, table salt, and sea salt. Canned and packaged gravies. Worcestershire sauce. Tartar sauce. Barbecue sauce. Teriyaki sauce. Soy sauce, including reduced sodium. Steak sauce. Fish sauce. Oyster sauce. Cocktail sauce. Horseradish. Ketchup and mustard. Meat flavorings and tenderizers. Bouillon cubes. Hot sauce. Tabasco sauce. Marinades. Taco seasonings. Relishes. Fats and Oils Butter, stick margarine, lard, shortening, ghee, and bacon fat. Coconut, palm kernel, or palm oils. Regular salad dressings. Other Pickles and olives. Salted popcorn and pretzels. The items listed above may not be a complete list of foods and beverages to avoid. Contact your dietitian for more information. WHERE CAN I FIND MORE INFORMATION? National Heart, Lung, and Blood Institute: travelstabloid.com Document Released: 04/04/2011 Document Revised: 08/30/2013 Document Reviewed: 02/17/2013 [facility] Patient Information 2015 [place], [place]. This information is not intended to replace advice given to you by your health care provider. Make sure you discuss any questions you have with your health care provider.

## 2015-01-20 NOTE — Assessment & Plan Note (Signed)
Quiet now No ACEI due to low BP now

## 2015-01-20 NOTE — Progress Notes (Signed)
Pre visit review using our clinic review tool, if applicable. No additional management support is needed unless otherwise documented below in the visit note. 

## 2015-01-20 NOTE — Telephone Encounter (Signed)
Spouse called back you can call her @ [phone]  Artist cell phone# [phone]  Derel will be out of town on monday

## 2015-01-20 NOTE — Assessment & Plan Note (Signed)
On fish oil only Intolerant of multiple statins

## 2015-01-23 MED ORDER — METFORMIN HCL ER 500 MG PO TB24: 500.0000 mg | ORAL_TABLET | Freq: Every day | ORAL | Status: DC

## 2015-01-23 NOTE — Telephone Encounter (Signed)
No answer from wife Left message with him Will try again

## 2015-01-23 NOTE — Telephone Encounter (Signed)
Please set him up for fasting blood work in about 3 months

## 2015-01-23 NOTE — Telephone Encounter (Signed)
Spoke to her--she notes that he is not compliant at all with diet---no surprise  Will start metformin ER daily

## 2015-01-24 NOTE — Telephone Encounter (Signed)
.  left message to have patient return my call.  

## 2015-01-25 NOTE — Telephone Encounter (Signed)
Spoke with patient's wife and advised results, lab appt scheduled

## 2015-02-10 LAB — HM DIABETES EYE EXAM

## 2015-02-17 ENCOUNTER — Encounter: Payer: Self-pay | Admitting: Internal Medicine

## 2015-04-06 ENCOUNTER — Encounter: Payer: Self-pay | Admitting: Internal Medicine

## 2015-04-10 ENCOUNTER — Other Ambulatory Visit (INDEPENDENT_AMBULATORY_CARE_PROVIDER_SITE_OTHER): Payer: PRIVATE HEALTH INSURANCE

## 2015-04-10 DIAGNOSIS — R7301 Impaired fasting glucose: Secondary | ICD-10-CM

## 2015-04-10 LAB — HEMOGLOBIN A1C: Hgb A1c MFr Bld: 6 % (ref 4.6–6.5)

## 2015-04-10 LAB — RENAL FUNCTION PANEL
Albumin: 4.1 g/dL (ref 3.5–5.2)
BUN: 21 mg/dL (ref 6–23)
CO2: 28 mEq/L (ref 19–32)
Calcium: 9.4 mg/dL (ref 8.4–10.5)
Chloride: 103 mEq/L (ref 96–112)
Creatinine, Ser: 1.05 mg/dL (ref 0.40–1.50)
GFR: 76.57 mL/min (ref >60.00)
Glucose, Bld: 112 mg/dL — ABNORMAL HIGH (ref 70–99)
Phosphorus: 2.7 mg/dL (ref 2.3–4.6)
Potassium: 4.3 mEq/L (ref 3.5–5.1)
Sodium: 139 mEq/L (ref 135–145)

## 2015-07-12 ENCOUNTER — Other Ambulatory Visit: Payer: Self-pay | Admitting: Internal Medicine

## 2015-07-12 NOTE — Telephone Encounter (Signed)
I spoke to the patient in regards to his metoprolol. He said he takes it everyday and has not missed a dose or cut it in half. The refill cycle seems to be off. I went ahead and did a refill because he said he was almost out. He said he does not mess around with this medication.

## 2015-09-06 ENCOUNTER — Encounter: Payer: Self-pay | Admitting: Internal Medicine

## 2015-09-06 MED ORDER — TRAZODONE HCL 100 MG PO TABS: 100.0000 mg | ORAL_TABLET | Freq: Every evening | ORAL | Status: DC | PRN

## 2015-09-06 NOTE — Telephone Encounter (Signed)
Last filled 12-27-13 #180 and stopped 09-02-14. Last OV 01-20-15 Next OV 01-26-16

## 2015-10-13 ENCOUNTER — Other Ambulatory Visit: Payer: Self-pay | Admitting: Internal Medicine

## 2015-10-13 NOTE — Telephone Encounter (Signed)
Rx sent electronically.  

## 2016-01-05 ENCOUNTER — Other Ambulatory Visit: Payer: Self-pay | Admitting: Internal Medicine

## 2016-01-26 ENCOUNTER — Encounter: Payer: Self-pay | Admitting: Internal Medicine

## 2016-01-26 ENCOUNTER — Ambulatory Visit (INDEPENDENT_AMBULATORY_CARE_PROVIDER_SITE_OTHER): Payer: BLUE CROSS/BLUE SHIELD | Admitting: Internal Medicine

## 2016-01-26 VITALS — BP 118/76 | HR 65 | Temp 97.7°F | Ht 70.0 in | Wt 235.8 lb

## 2016-01-26 DIAGNOSIS — R7301 Impaired fasting glucose: Secondary | ICD-10-CM | POA: Diagnosis not present

## 2016-01-26 DIAGNOSIS — G479 Sleep disorder, unspecified: Secondary | ICD-10-CM | POA: Diagnosis not present

## 2016-01-26 DIAGNOSIS — I2510 Atherosclerotic heart disease of native coronary artery without angina pectoris: Secondary | ICD-10-CM

## 2016-01-26 DIAGNOSIS — Z0000 Encounter for general adult medical examination without abnormal findings: Secondary | ICD-10-CM

## 2016-01-26 DIAGNOSIS — G629 Polyneuropathy, unspecified: Secondary | ICD-10-CM

## 2016-01-26 LAB — CBC WITH DIFFERENTIAL/PLATELET
Basophils Absolute: 0.1 10*3/uL (ref 0.0–0.1)
Basophils Relative: 0.7 % (ref 0.0–3.0)
Eosinophils Absolute: 1.2 10*3/uL — ABNORMAL HIGH (ref 0.0–0.7)
Eosinophils Relative: 11.6 % — ABNORMAL HIGH (ref 0.0–5.0)
HCT: 47 % (ref 39.0–52.0)
Hemoglobin: 16.1 g/dL (ref 13.0–17.0)
Lymphocytes Relative: 28.2 % (ref 12.0–46.0)
Lymphs Abs: 2.9 10*3/uL (ref 0.7–4.0)
MCHC: 34.2 g/dL (ref 30.0–36.0)
MCV: 88.3 fl (ref 78.0–100.0)
Monocytes Absolute: 1 10*3/uL (ref 0.1–1.0)
Monocytes Relative: 9.3 % (ref 3.0–12.0)
Neutro Abs: 5.2 10*3/uL (ref 1.4–7.7)
Neutrophils Relative %: 50.2 % (ref 43.0–77.0)
Platelets: 282 10*3/uL (ref 150.0–400.0)
RBC: 5.32 Mil/uL (ref 4.22–5.81)
RDW: 13.8 % (ref 11.5–15.5)
WBC: 10.3 10*3/uL (ref 4.0–10.5)

## 2016-01-26 LAB — T4, FREE: Free T4: 1.03 ng/dL (ref 0.60–1.60)

## 2016-01-26 LAB — COMPREHENSIVE METABOLIC PANEL
ALT: 23 U/L (ref 0–53)
AST: 22 U/L (ref 0–37)
Albumin: 4.2 g/dL (ref 3.5–5.2)
Alkaline Phosphatase: 98 U/L (ref 39–117)
BUN: 19 mg/dL (ref 6–23)
CO2: 32 mEq/L (ref 19–32)
Calcium: 9.5 mg/dL (ref 8.4–10.5)
Chloride: 104 mEq/L (ref 96–112)
Creatinine, Ser: 1.04 mg/dL (ref 0.40–1.50)
GFR: 77.21 mL/min (ref >60.00)
Glucose, Bld: 102 mg/dL — ABNORMAL HIGH (ref 70–99)
Potassium: 4.2 mEq/L (ref 3.5–5.1)
Sodium: 142 mEq/L (ref 135–145)
Total Bilirubin: 0.8 mg/dL (ref 0.2–1.2)
Total Protein: 7.6 g/dL (ref 6.0–8.3)

## 2016-01-26 LAB — LIPID PANEL
Cholesterol: 208 mg/dL — ABNORMAL HIGH (ref 0–200)
HDL: 58.8 mg/dL (ref >39.00)
LDL Cholesterol: 135 mg/dL — ABNORMAL HIGH (ref 0–99)
NonHDL: 149.12
Total CHOL/HDL Ratio: 4
Triglycerides: 71 mg/dL (ref 0.0–149.0)
VLDL: 14.2 mg/dL (ref 0.0–40.0)

## 2016-01-26 LAB — HEMOGLOBIN A1C: Hgb A1c MFr Bld: 6 % (ref 4.6–6.5)

## 2016-01-26 LAB — VITAMIN B12: Vitamin B-12: 299 pg/mL (ref 211–911)

## 2016-01-26 MED ORDER — ZOSTER VACCINE LIVE 19400 UNT/0.65ML ~~LOC~~ SUSR: 0.6500 mL | Freq: Once | SUBCUTANEOUS | 0 refills | Status: AC

## 2016-01-26 NOTE — Progress Notes (Signed)
Pre visit review using our clinic review tool, if applicable. No additional management support is needed unless otherwise documented below in the visit note. 

## 2016-01-26 NOTE — Patient Instructions (Addendum)
Please check about the shingles vaccine at [facility] [place]. You can try over the counter melatonin-- 3-6mg  nightly ---to see if that helps you sleep without the dry mouth.

## 2016-01-26 NOTE — Assessment & Plan Note (Signed)
No symptoms Doesn't use nitro No ACEI due to low BP

## 2016-01-26 NOTE — Assessment & Plan Note (Signed)
Didn't like the metformin Hopefully better with the weight loss

## 2016-01-26 NOTE — Assessment & Plan Note (Signed)
Trazodone helps but he awakens with dry mouth Discussed trying melatonin

## 2016-01-26 NOTE — Assessment & Plan Note (Signed)
Has lost weight Discussed fitness Discussed PSA--will defer again Colon due next year He will get flu vaccine at work Rx for zostavax

## 2016-01-26 NOTE — Progress Notes (Signed)
Subjective:    Patient ID: [name], male    DOB: [birth year], 60 y.o.   MRN: [id number]  HPI Here for physical  Didn't continue the metformin Stopped all the sugared drinks--lost 20# Still physically active at work--no set exercise  Still having hand pain Occasionally swell Aleve helps sometimes May be worse when driving distances No numbness in hands or night symptoms  Having tingling in feet in past few months No pain in feet  No heart troubles No chest pain No SOB No dizziness or syncope No edema  Has some sleep problems Trazodone has really helped--but awakens with dry mouth.  Current Outpatient Prescriptions on File Prior to Visit  Medication Sig Dispense Refill  . aspirin 81 MG tablet Take 1 tablet (81 mg total) by mouth daily. 30 tablet 30  . fluticasone (FLONASE) 50 MCG/ACT nasal spray PLACE 2 SPRAYS IN TO BOTH NOSTRILS DAILY 16 g 6  . metoprolol (LOPRESSOR) 50 MG tablet TAKE 1 TABLET BY MOUTH DAILY 90 tablet 0  . nitroGLYCERIN (NITROSTAT) 0.4 MG SL tablet Place 1 tablet (0.4 mg total) under the tongue every 5 (five) minutes as needed. 30 tablet 0  . omeprazole (PRILOSEC) 20 MG capsule TAKE ONE CAPSULE BY MOUTH DAILY 90 capsule 3  . traZODone (DESYREL) 100 MG tablet Take 1-2 tablets (100-200 mg total) by mouth at bedtime as needed for sleep. 180 tablet 1   No current facility-administered medications on file prior to visit.     Allergies  Allergen Reactions  . Atorvastatin     REACTION: muscle aches  . Simvastatin     REACTION: hand pain  . Pravachol [Pravastatin Sodium]     Muscle aching    Past Medical History:  Diagnosis Date  . CAD (coronary artery disease) 7/07   Cath for unstable angina--- 50-70% LAD, 40% circ  . ED (erectile dysfunction)   . GERD (gastroesophageal reflux disease)   . Hx of colonic polyps   . Hyperlipidemia   . Hypertension   . Impaired fasting glucose   . Sleep disorder   . Wrist fracture     6 foot fall onto head,  concussion, L wrist (comminuted with intra-articular extension) and 2nd finger (middle phalanx) fxs, vertigo    Past Surgical History:  Procedure Laterality Date  . BRANCHIAL CLEFT CYST EXCISION    . HEMORRHOID SURGERY    . RHINOPLASTY      Family History  Problem Relation Age of Onset  . Coronary artery disease Mother   . Coronary artery disease Brother   . Diabetes Neg Hx   . Cancer Brother     colon or rectal  . Stroke Mother     Social History   Social History  . Marital status: Married    Spouse name: N/A  . Number of children: 4  . Years of education: N/A   Occupational History  . 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 Smoking status: Former Smoker    Quit date: 10/27/2005  . Smokeless tobacco: Never Used  . Alcohol use Yes     Comment: rare  . Drug use: No  . Sexual activity: Not on file   Other Topics Concern  . Not on file   Social History Narrative  . No narrative on file   Review of Systems  Constitutional: Negative for fatigue.       Wears seat belt  HENT: Negative for dental problem, hearing loss and tinnitus.  Keeps up with dentist  Eyes: Negative for visual disturbance.       No diplopia or unilateral vision loss  Respiratory: Negative for cough, chest tightness and shortness of breath.   Cardiovascular: Negative for chest pain and palpitations.  Gastrointestinal: Negative for abdominal pain, blood in stool, constipation and nausea.       No heartburn   Endocrine: Negative for polydipsia and polyuria.  Genitourinary: Negative for difficulty urinating and urgency.       No sexual problems  Musculoskeletal: Positive for arthralgias. Negative for back pain and joint swelling.  Skin: Negative for rash.       No suspicious lesions  Allergic/Immunologic: Negative for environmental allergies and immunocompromised state.  Neurological: Negative for dizziness, syncope, light-headedness and headaches.  Hematological:  Negative for adenopathy. Bruises/bleeds easily.  Psychiatric/Behavioral: Negative for dysphoric mood. The patient is not nervous/anxious.        Objective:   Physical Exam  Constitutional: He is oriented to person, place, and time. He appears well-developed and well-nourished. No distress.  HENT:  Head: Normocephalic and atraumatic.  Right Ear: External ear normal.  Left Ear: External ear normal.  Mouth/Throat: Oropharynx is clear and moist. No oropharyngeal exudate.  Eyes: Conjunctivae are normal. Pupils are equal, round, and reactive to light.  Neck: Normal range of motion. Neck supple. No thyromegaly present.  Cardiovascular: Normal rate, regular rhythm, normal heart sounds and intact distal pulses.  Exam reveals no gallop.   No murmur heard. Pulmonary/Chest: Effort normal and breath sounds normal. No respiratory distress. He has no wheezes. He has no rales.  Abdominal: Soft. There is no tenderness.  Musculoskeletal: He exhibits no edema or tenderness.  Lymphadenopathy:    He has no cervical adenopathy.  Neurological: He is alert and oriented to person, place, and time.  Skin: No rash noted. No erythema.  Psychiatric: He has a normal mood and affect. His behavior is normal.          Assessment & Plan:

## 2016-01-26 NOTE — Assessment & Plan Note (Signed)
Tingling in feet ?related to sugars Will check labs

## 2016-02-09 ENCOUNTER — Ambulatory Visit (INDEPENDENT_AMBULATORY_CARE_PROVIDER_SITE_OTHER): Payer: BLUE CROSS/BLUE SHIELD | Admitting: Internal Medicine

## 2016-02-09 ENCOUNTER — Encounter: Payer: Self-pay | Admitting: Internal Medicine

## 2016-02-09 DIAGNOSIS — L918 Other hypertrophic disorders of the skin: Secondary | ICD-10-CM | POA: Diagnosis not present

## 2016-02-09 NOTE — Assessment & Plan Note (Signed)
Liquid nitrogen for 45 seconds x 2 Tolerated well Discussed home care

## 2016-02-09 NOTE — Progress Notes (Signed)
   Subjective:    Patient ID: [name], male    DOB: September 29, 1955, 60 y.o.   MRN: [id number]  HPI Here due to a change in axillary lesion With wife  Has had skin lesion for a while in left axilla Changed color from flesh colored to black/red Some pain No drainage  Current Outpatient Prescriptions on File Prior to Visit  Medication Sig Dispense Refill  . aspirin 81 MG tablet Take 1 tablet (81 mg total) by mouth daily. 30 tablet 30  . fluticasone (FLONASE) 50 MCG/ACT nasal spray PLACE 2 SPRAYS IN TO BOTH NOSTRILS DAILY 16 g 6  . metoprolol (LOPRESSOR) 50 MG tablet TAKE 1 TABLET BY MOUTH DAILY 90 tablet 0  . nitroGLYCERIN (NITROSTAT) 0.4 MG SL tablet Place 1 tablet (0.4 mg total) under the tongue every 5 (five) minutes as needed. 30 tablet 0  . omeprazole (PRILOSEC) 20 MG capsule TAKE ONE CAPSULE BY MOUTH DAILY 90 capsule 3  . traZODone (DESYREL) 100 MG tablet Take 1-2 tablets (100-200 mg total) by mouth at bedtime as needed for sleep. 180 tablet 1   No current facility-administered medications on file prior to visit.     Allergies  Allergen Reactions  . Atorvastatin     REACTION: muscle aches  . Simvastatin     REACTION: hand pain  . Pravachol [Pravastatin Sodium]     Muscle aching    Past Medical History:  Diagnosis Date  . CAD (coronary artery disease) 7/07   Cath for unstable angina--- 50-70% LAD, 40% circ  . ED (erectile dysfunction)   . GERD (gastroesophageal reflux disease)   . Hx of colonic polyps   . Hyperlipidemia   . Hypertension   . Impaired fasting glucose   . Sleep disorder   . Wrist fracture     6 foot fall onto head, concussion, L wrist (comminuted with intra-articular extension) and 2nd finger (middle phalanx) fxs, vertigo    Past Surgical History:  Procedure Laterality Date  . BRANCHIAL CLEFT CYST EXCISION    . HEMORRHOID SURGERY    . RHINOPLASTY      Family History  Problem Relation Age of Onset  . Coronary artery disease Mother   .  Coronary artery disease Brother   . Diabetes Neg Hx   . Cancer Brother     colon or rectal  . Stroke Mother     Social History   Social History  . Marital status: Married    Spouse name: N/A  . Number of children: 4  . Years of education: N/A   Occupational History  . 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 Smoking status: Former Smoker    Quit date: 10/27/2005  . Smokeless tobacco: Never Used  . Alcohol use Yes     Comment: rare  . Drug use: No  . Sexual activity: Not on file   Other Topics Concern  . Not on file   Social History Narrative  . No narrative on file   Review of Systems No fever Appetite normal Feels fine otherwise    Objective:   Physical Exam  Skin:  Partially thrombosed skin tag--inflamed at base At anterior axillary line on left          Assessment & Plan:

## 2016-02-09 NOTE — Progress Notes (Signed)
Pre visit review using our clinic review tool, if applicable. No additional management support is needed unless otherwise documented below in the visit note. 

## 2016-02-12 DIAGNOSIS — Z23 Encounter for immunization: Secondary | ICD-10-CM | POA: Diagnosis not present

## 2016-03-06 ENCOUNTER — Encounter: Payer: Self-pay | Admitting: Internal Medicine

## 2016-04-08 ENCOUNTER — Other Ambulatory Visit: Payer: Self-pay | Admitting: Internal Medicine

## 2016-05-28 ENCOUNTER — Encounter: Payer: Self-pay | Admitting: Internal Medicine

## 2016-05-30 ENCOUNTER — Ambulatory Visit
Admission: RE | Admit: 2016-05-30 | Discharge: 2016-05-30 | Disposition: A | Discharge status: Home / Self Care | Payer: BLUE CROSS/BLUE SHIELD | Source: Ambulatory Visit | Attending: Internal Medicine | Admitting: Internal Medicine

## 2016-05-30 ENCOUNTER — Other Ambulatory Visit: Payer: Self-pay | Admitting: Internal Medicine

## 2016-05-30 ENCOUNTER — Ambulatory Visit (INDEPENDENT_AMBULATORY_CARE_PROVIDER_SITE_OTHER): Payer: BLUE CROSS/BLUE SHIELD | Admitting: Internal Medicine

## 2016-05-30 ENCOUNTER — Encounter: Payer: Self-pay | Admitting: Internal Medicine

## 2016-05-30 ENCOUNTER — Ambulatory Visit (INDEPENDENT_AMBULATORY_CARE_PROVIDER_SITE_OTHER)
Admission: RE | Admit: 2016-05-30 | Discharge: 2016-05-30 | Disposition: A | Discharge status: Home / Self Care | Payer: BLUE CROSS/BLUE SHIELD | Source: Ambulatory Visit | Attending: Internal Medicine | Admitting: Internal Medicine

## 2016-05-30 VITALS — BP 118/74 | HR 61 | Temp 97.5°F | Wt 251.0 lb

## 2016-05-30 DIAGNOSIS — M19041 Primary osteoarthritis, right hand: Secondary | ICD-10-CM | POA: Diagnosis not present

## 2016-05-30 DIAGNOSIS — M79642 Pain in left hand: Principal | ICD-10-CM

## 2016-05-30 DIAGNOSIS — M79641 Pain in right hand: Secondary | ICD-10-CM

## 2016-05-30 DIAGNOSIS — M19042 Primary osteoarthritis, left hand: Secondary | ICD-10-CM | POA: Diagnosis not present

## 2016-05-30 MED ORDER — TRAMADOL HCL 50 MG PO TABS: 50.0000 mg | ORAL_TABLET | Freq: Three times a day (TID) | ORAL | 0 refills | Status: DC | PRN

## 2016-05-30 MED ORDER — FLUTICASONE PROPIONATE 50 MCG/ACT NA SUSP: NASAL | 11 refills | Status: DC

## 2016-05-30 NOTE — Assessment & Plan Note (Signed)
Some concern for RA based on distribution and mild synovitis Will check labs and x-rays Tramadol for prn If RA--will give prednisone burst and start MTX

## 2016-05-30 NOTE — Progress Notes (Signed)
Pre visit review using our clinic review tool, if applicable. No additional management support is needed unless otherwise documented below in the visit note. 

## 2016-05-30 NOTE — Progress Notes (Signed)
Subjective:    Patient ID: [name], male    DOB: 1955/11/14, 61 y.o.   MRN: [id number]  HPI Here with wife due to worsened hand pain  Having swelling in the whole hand Significant pain now  Stiff in AM--- will loosen up after a shower  Using aleve at times--- not helping recently Stopped a while back after the blood in urine  Using hands all day long--tools, etc Doesn't notice it as much while working  Current Outpatient Prescriptions on File Prior to Visit  Medication Sig Dispense Refill  . aspirin 81 MG tablet Take 1 tablet (81 mg total) by mouth daily. 30 tablet 30  . metoprolol (LOPRESSOR) 50 MG tablet TAKE 1 TABLET BY MOUTH DAILY 90 tablet 3  . nitroGLYCERIN (NITROSTAT) 0.4 MG SL tablet Place 1 tablet (0.4 mg total) under the tongue every 5 (five) minutes as needed. 30 tablet 0  . omeprazole (PRILOSEC) 20 MG capsule TAKE ONE CAPSULE BY MOUTH DAILY 90 capsule 3  . traZODone (DESYREL) 100 MG tablet Take 1-2 tablets (100-200 mg total) by mouth at bedtime as needed for sleep. 180 tablet 1   No current facility-administered medications on file prior to visit.     Allergies  Allergen Reactions  . Atorvastatin     REACTION: muscle aches  . Simvastatin     REACTION: hand pain  . Pravachol [Pravastatin Sodium]     Muscle aching    Past Medical History:  Diagnosis Date  . CAD (coronary artery disease) 7/07   Cath for unstable angina--- 50-70% LAD, 40% circ  . ED (erectile dysfunction)   . GERD (gastroesophageal reflux disease)   . Hx of colonic polyps   . Hyperlipidemia   . Hypertension   . Impaired fasting glucose   . Sleep disorder   . Wrist fracture     6 foot fall onto head, concussion, L wrist (comminuted with intra-articular extension) and 2nd finger (middle phalanx) fxs, vertigo    Past Surgical History:  Procedure Laterality Date  . BRANCHIAL CLEFT CYST EXCISION    . HEMORRHOID SURGERY    . RHINOPLASTY      Family History  Problem Relation Age of  Onset  . Coronary artery disease Mother   . Coronary artery disease Brother   . Diabetes Neg Hx   . Cancer Brother     colon or rectal  . Stroke Mother     Social History   Social History  . Marital status: Married    Spouse name: N/A  . Number of children: 4  . Years of education: N/A   Occupational History  . 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 Smokeless tobacco: Never Used  . Alcohol use Yes     Comment: rare  . Drug use: No  . Sexual activity: Not on file   Other Topics Concern  . Not on file   Social History Narrative  . No narrative on file   Review of Systems No fever No swelling in any other joints Mild knee pain--much different Brother and mom with arthritis---not clear about RA or not though    Objective:   Physical Exam  Constitutional: He appears well-nourished. No distress.  Musculoskeletal:  Hands are puffy but not significantly swollen Tenderness along all MCPs Mild synovitis in PIPs bilaterally DIP and wrists seem quiet CMC joint on right is quite tender also  No other joint findings          Assessment & Plan:

## 2016-05-31 LAB — RHEUMATOID FACTOR: Rhuematoid fact SerPl-aCnc: <14 IU/mL (ref <14)

## 2016-05-31 LAB — C-REACTIVE PROTEIN: CRP: 0.3 mg/dL — ABNORMAL LOW (ref 0.5–20.0)

## 2016-05-31 LAB — CYCLIC CITRUL PEPTIDE ANTIBODY, IGG: Cyclic Citrullin Peptide Ab: <16 Units

## 2016-05-31 LAB — SEDIMENTATION RATE: Sed Rate: 7 mm/hr (ref 0–20)

## 2016-08-05 DIAGNOSIS — I2510 Atherosclerotic heart disease of native coronary artery without angina pectoris: Secondary | ICD-10-CM | POA: Insufficient documentation

## 2016-08-05 DIAGNOSIS — Z87891 Personal history of nicotine dependence: Secondary | ICD-10-CM | POA: Diagnosis not present

## 2016-08-05 DIAGNOSIS — Z7982 Long term (current) use of aspirin: Secondary | ICD-10-CM | POA: Insufficient documentation

## 2016-08-05 DIAGNOSIS — I10 Essential (primary) hypertension: Secondary | ICD-10-CM | POA: Insufficient documentation

## 2016-08-05 DIAGNOSIS — Z79899 Other long term (current) drug therapy: Secondary | ICD-10-CM | POA: Insufficient documentation

## 2016-08-05 DIAGNOSIS — M545 Low back pain: Secondary | ICD-10-CM | POA: Insufficient documentation

## 2016-08-06 ENCOUNTER — Encounter (HOSPITAL_COMMUNITY): Payer: Self-pay | Admitting: Emergency Medicine

## 2016-08-06 ENCOUNTER — Telehealth: Payer: Self-pay

## 2016-08-06 ENCOUNTER — Emergency Department (HOSPITAL_COMMUNITY): Payer: Worker's Compensation

## 2016-08-06 ENCOUNTER — Emergency Department (HOSPITAL_COMMUNITY)
Admission: EM | Admit: 2016-08-06 | Discharge: 2016-08-06 | Disposition: A | Discharge status: Home / Self Care | Payer: Worker's Compensation | Attending: Emergency Medicine | Admitting: Emergency Medicine

## 2016-08-06 DIAGNOSIS — M545 Low back pain, unspecified: Secondary | ICD-10-CM

## 2016-08-06 MED ORDER — DICLOFENAC SODIUM 50 MG PO TBEC: 50.0000 mg | DELAYED_RELEASE_TABLET | Freq: Two times a day (BID) | ORAL | 0 refills | Status: DC

## 2016-08-06 MED ORDER — OXYCODONE-ACETAMINOPHEN 5-325 MG PO TABS
1.0000 | ORAL_TABLET | Freq: Once | ORAL | Status: AC
  Administered 2016-08-06: 1 via ORAL
  Filled 2016-08-06: qty 1

## 2016-08-06 MED ORDER — KETOROLAC TROMETHAMINE 60 MG/2ML IM SOLN
30.0000 mg | Freq: Once | INTRAMUSCULAR | Status: AC
  Administered 2016-08-06: 30 mg via INTRAMUSCULAR
  Filled 2016-08-06: qty 2

## 2016-08-06 MED ORDER — OXYCODONE-ACETAMINOPHEN 5-325 MG PO TABS: 2.0000 | ORAL_TABLET | ORAL | 0 refills | Status: DC | PRN

## 2016-08-06 MED ORDER — CYCLOBENZAPRINE HCL 10 MG PO TABS
10.0000 mg | ORAL_TABLET | Freq: Once | ORAL | Status: AC
  Administered 2016-08-06: 10 mg via ORAL
  Filled 2016-08-06: qty 1

## 2016-08-06 MED ORDER — ONDANSETRON 4 MG PO TBDP
4.0000 mg | ORAL_TABLET | Freq: Once | ORAL | Status: AC
  Administered 2016-08-06: 4 mg via ORAL
  Filled 2016-08-06: qty 1

## 2016-08-06 MED ORDER — METHOCARBAMOL 500 MG PO TABS: 500.0000 mg | ORAL_TABLET | Freq: Two times a day (BID) | ORAL | 0 refills | Status: DC

## 2016-08-06 MED ORDER — LIDOCAINE 5 % EX PTCH: 1.0000 | MEDICATED_PATCH | CUTANEOUS | 0 refills | Status: DC

## 2016-08-06 NOTE — Discharge Instructions (Signed)
Do not drive while taking the muscle relaxant as it will make you sleepy. Follow up with your PCP or return here as needed for worsening symptoms.

## 2016-08-06 NOTE — Telephone Encounter (Signed)
Left message on VM to call office to see how he was doing after his ER visit for back pain

## 2016-08-06 NOTE — ED Provider Notes (Signed)
[facility] DEPT Provider Note   CSN: [id number] Arrival date & time: 08/05/16  2328     History   Chief Complaint Chief Complaint  Patient presents with  . Back Pain    worker's comp    HPI [name] is a 61 y.o. male who presents to the ED with low back pain that started a few hours prior to arrival to the ED. 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 He denies other injuries. No loss of control of bladder or bowels.   HPI  Past Medical History:  Diagnosis Date  . CAD (coronary artery disease) 7/07   Cath for unstable angina--- 50-70% LAD, 40% circ  . ED (erectile dysfunction)   . GERD (gastroesophageal reflux disease)   . Hx of colonic polyps   . Hyperlipidemia   . Hypertension   . Impaired fasting glucose   . Sleep disorder   . Wrist fracture     6 foot fall onto head, concussion, L wrist (comminuted with intra-articular extension) and 2nd finger (middle phalanx) fxs, vertigo    Patient Active Problem List   Diagnosis Date Noted  . Bilateral hand pain 05/30/2016  . Inflamed skin tag 02/09/2016  . Neuropathy ([place]) 01/26/2016  . RBBB 09/02/2014  . CAD (coronary artery disease)   . Routine general medical examination at a health care facility 11/29/2011  . IMPAIRED FASTING GLUCOSE 12/02/2008  . Essential hypertension, benign 05/16/2007  . DIVERTICULOSIS, COLON 05/16/2007  . COLONIC POLYPS, HYPERPLASTIC 04/03/2007  . Hyperlipemia 11/25/2006  . GERD 11/25/2006  . 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 Sleep disturbance 11/25/2006    Past Surgical History:  Procedure Laterality Date  . BRANCHIAL CLEFT CYST EXCISION    . HEMORRHOID SURGERY    . RHINOPLASTY         Home Medications    Prior to Admission medications   Medication Sig Start Date End Date Taking? Authorizing Provider  aspirin 81 MG tablet Take 1 tablet (81 mg  total) by mouth daily. 01/20/15   Venia Carbon, MD  diclofenac (VOLTAREN) 50 MG EC tablet Take 1 tablet (50 mg total) by mouth 2 (two) times daily. 08/06/16   Hope Bunnie Pion, NP  fluticasone (FLONASE) 50 MCG/ACT nasal spray USE TWO SPRAY IN EACH NOSTRIL DAILY 05/30/16   Venia Carbon, MD  methocarbamol (ROBAXIN) 500 MG tablet Take 1 tablet (500 mg total) by mouth 2 (two) times daily. 08/06/16   Hope Bunnie Pion, NP  metoprolol (LOPRESSOR) 50 MG tablet TAKE 1 TABLET BY MOUTH DAILY 04/09/16   Venia Carbon, MD  nitroGLYCERIN (NITROSTAT) 0.4 MG SL tablet Place 1 tablet (0.4 mg total) under the tongue every 5 (five) minutes as needed. 01/20/15   Venia Carbon, MD  omeprazole (PRILOSEC) 20 MG capsule TAKE ONE CAPSULE BY MOUTH DAILY 10/13/15   Venia Carbon, MD  traMADol (ULTRAM) 50 MG tablet Take 1 tablet (50 mg total) by mouth 3 (three) times daily as needed. 05/30/16   Venia Carbon, MD  traZODone (DESYREL) 100 MG tablet Take 1-2 tablets (100-200 mg total) by mouth at bedtime as needed for sleep. 09/06/15   Venia Carbon, MD    Family History Family History  Problem Relation Age of Onset  . Coronary artery disease Mother   . Stroke Mother   . Coronary  artery disease Brother   . Cancer Brother     colon or rectal  . Diabetes Neg Hx     Social History Social History  Substance Use Topics  . Smoking status: Former Smoker    Years: 35.00    Types: Cigarettes    Quit date: 10/27/2005  . Smokeless tobacco: Never Used  . Alcohol use Yes     Comment: rare     Allergies   Atorvastatin; Simvastatin; and Pravachol [pravastatin sodium]   Review of Systems Review of Systems  Gastrointestinal: Negative for nausea.  Genitourinary:       No loss of control of bladder or bowels.  Musculoskeletal: Positive for back pain.  Skin: Negative for wound.  Psychiatric/Behavioral: The patient is not nervous/anxious.      Physical Exam Updated Vital Signs BP (!) 155/90   Pulse 72   Temp  97.8 F (36.6 C) (Oral)   Resp 18   Ht 5\' 10"  (1.778 m)   Wt 113.4 kg   SpO2 96%   BMI 35.87 kg/m   Physical Exam  Constitutional: He is oriented to person, place, and time. He appears well-developed and well-nourished. No distress.  HENT:  Head: Normocephalic and atraumatic.  Eyes: EOM are normal. Pupils are equal, round, and reactive to light.  Neck: Normal range of motion. Neck supple.  Cardiovascular: Normal rate and regular rhythm.   Pulmonary/Chest: Effort normal. He has no wheezes. He has no rales.  Abdominal: Soft. Bowel sounds are normal. There is no tenderness.  Musculoskeletal: Normal range of motion. He exhibits no edema.       Lumbar back: He exhibits tenderness. He exhibits normal range of motion, no deformity, no spasm and normal pulse.  Neurological: He is alert and oriented to person, place, and time. He has normal strength. Gait normal.  Reflex Scores:      Bicep reflexes are 2+ on the right side and 2+ on the left side.      Brachioradialis reflexes are 2+ on the right side and 2+ on the left side.      Patellar reflexes are 2+ on the right side and 2+ on the left side. Skin: Skin is warm and dry.  Psychiatric: He has a normal mood and affect. His behavior is normal.  Nursing note and vitals reviewed.    ED Treatments / Results  Labs (all labs ordered are listed, but only abnormal results are displayed) Labs Reviewed - No data to display  Radiology Dg Lumbar Spine Complete  Result Date: 08/06/2016 CLINICAL DATA:  Acute onset of left lower back pain, with bilateral leg cramping. Initial encounter. EXAM: LUMBAR SPINE - COMPLETE 4+ VIEW COMPARISON:  CT of the abdomen and pelvis performed 12/13/2011 FINDINGS: There is no evidence of fracture or subluxation. Vertebral bodies demonstrate normal height and alignment. Lateral and anterior osteophytes are noted along the lumbar spine. There is mild disc space narrowing at L4-L5. Facet disease is noted along the lumbar  spine. The visualized bowel gas pattern is unremarkable in appearance; air and stool are noted within the colon. The sacroiliac joints are within normal limits. IMPRESSION: 1. No evidence of fracture or subluxation along the lumbar spine. 2. Mild degenerative change along the lumbar spine. Electronically Signed   By: Garald Balding M.D.   On: 08/06/2016 01:28    Procedures Procedures (including critical care time)  Medications Ordered in ED Medications  cyclobenzaprine (FLEXERIL) tablet 10 mg (10 mg Oral Given 08/06/16 0046)  oxyCODONE-acetaminophen (PERCOCET/ROXICET)  5-325 MG per tablet 1 tablet (1 tablet Oral Given 08/06/16 0212)  ketorolac (TORADOL) injection 30 mg (30 mg Intramuscular Given 08/06/16 0212)  ondansetron (ZOFRAN-ODT) disintegrating tablet 4 mg (4 mg Oral Given 08/06/16 0234)     Initial Impression / Assessment and Plan / ED Course  I have reviewed the triage vital signs and the nursing notes.  Pertinent labs & imaging results that were available during my care of the patient were reviewed by me and considered in my medical decision making (see chart for details).   Final Clinical Impressions(s) / ED Diagnoses  62 y.o. male with low back pain s/p injury at work today stable for d/c without neuro deficits. Will treat for pain and inflammation and muscle spasm. Discussed with the patient and all questioned fully answered.   Final diagnoses:  Lumbosacral pain    New Prescriptions New Prescriptions   DICLOFENAC (VOLTAREN) 50 MG EC TABLET    Take 1 tablet (50 mg total) by mouth 2 (two) times daily.   METHOCARBAMOL (ROBAXIN) 500 MG TABLET    Take 1 tablet (500 mg total) by mouth 2 (two) times daily.     [address] [place], NP 08/06/16 0302    Merryl Hacker, MD 08/08/16 [phone]

## 2016-08-06 NOTE — Telephone Encounter (Signed)
His employer has sent him to [place] for some therapy.

## 2016-08-06 NOTE — ED Notes (Signed)
Pt states he feels nauseous

## 2016-08-06 NOTE — ED Triage Notes (Signed)
Pt to ED c/o lower left back pain since 1615 today. 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 Pt unable to ambulate or move certain ways without severe pain. Denies urinary/bowel incontinence, no numbness/tingling. Worker's comp.

## 2016-08-23 ENCOUNTER — Encounter: Payer: Self-pay | Admitting: Internal Medicine

## 2016-08-28 ENCOUNTER — Encounter: Payer: Self-pay | Admitting: Internal Medicine

## 2016-08-29 NOTE — Telephone Encounter (Signed)
He probably needs an appt but check with him and okay to refill the diclofenac #60 x 0 appt next week?

## 2016-08-30 MED ORDER — DICLOFENAC SODIUM 50 MG PO TBEC: 50.0000 mg | DELAYED_RELEASE_TABLET | Freq: Two times a day (BID) | ORAL | 0 refills | Status: DC

## 2016-08-30 NOTE — Telephone Encounter (Signed)
Spoke to pt. Made appt 09-06-16. Refill sent to [facility].

## 2016-09-02 ENCOUNTER — Telehealth: Payer: Self-pay | Admitting: *Deleted

## 2016-09-02 NOTE — Telephone Encounter (Signed)
Spoke to pts wife, scarlett, who states pt was prescribed voltaren 50mg . In reading contraindications, she is concerned about pt taking a daily aspirin and is wondering if he should continue both. pls advise

## 2016-09-02 NOTE — Telephone Encounter (Signed)
That is a low dose of the voltaren--but he may want to hold his aspirin if he is taking 2 a day

## 2016-09-02 NOTE — Telephone Encounter (Signed)
Patent's wife notified as instructed by telephone and verbalized understanding.

## 2016-09-06 ENCOUNTER — Other Ambulatory Visit: Payer: Self-pay | Admitting: Internal Medicine

## 2016-09-06 ENCOUNTER — Ambulatory Visit: Payer: Worker's Compensation | Admitting: Internal Medicine

## 2016-09-13 ENCOUNTER — Encounter: Payer: Self-pay | Admitting: Internal Medicine

## 2016-09-13 ENCOUNTER — Ambulatory Visit (INDEPENDENT_AMBULATORY_CARE_PROVIDER_SITE_OTHER): Payer: BLUE CROSS/BLUE SHIELD | Admitting: Internal Medicine

## 2016-09-13 VITALS — BP 112/80 | HR 53 | Temp 97.4°F | Wt 253.0 lb

## 2016-09-13 DIAGNOSIS — L570 Actinic keratosis: Secondary | ICD-10-CM | POA: Insufficient documentation

## 2016-09-13 DIAGNOSIS — M79641 Pain in right hand: Secondary | ICD-10-CM

## 2016-09-13 DIAGNOSIS — M79642 Pain in left hand: Secondary | ICD-10-CM | POA: Diagnosis not present

## 2016-09-13 LAB — URIC ACID: Uric Acid, Serum: 5 mg/dL (ref 4.0–7.8)

## 2016-09-13 NOTE — Progress Notes (Signed)
Subjective:    Patient ID: [name], male    DOB: 1955/08/30, 61 y.o.   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 aspirin 81 MG tablet Take 1 tablet (81 mg total) by mouth daily. 30 tablet 30  . diclofenac (VOLTAREN) 50 MG EC tablet Take 1 tablet (50 mg total) by mouth 2 (two) times daily. 60 tablet 0  . fluticasone (FLONASE) 50 MCG/ACT nasal spray USE TWO SPRAY IN EACH NOSTRIL DAILY 16 g 11  . methocarbamol (ROBAXIN) 500 MG tablet Take 1 tablet (500 mg total) by mouth 2 (two) times daily. 20 tablet 0  . metoprolol (LOPRESSOR) 50 MG tablet TAKE 1 TABLET BY MOUTH DAILY 90 tablet 3  . nitroGLYCERIN (NITROSTAT) 0.4 MG SL tablet Place 1 tablet (0.4 mg total) under the tongue every 5 (five) minutes as needed. 30 tablet 0  . omeprazole (PRILOSEC) 20 MG capsule TAKE ONE CAPSULE BY MOUTH DAILY 90 capsule 1  . traMADol (ULTRAM) 50 MG tablet Take 1 tablet (50 mg total) by mouth 3 (three) times daily as needed. 30 tablet 0  . traZODone (DESYREL) 100 MG tablet Take 1-2 tablets (100-200 mg total) by mouth at bedtime as needed for sleep. 180 tablet 1   No current facility-administered medications on file prior to visit.     Allergies  Allergen Reactions  . Atorvastatin     REACTION: muscle aches  . Simvastatin     REACTION: hand pain  . Pravachol [Pravastatin Sodium]     Muscle aching    Past Medical History:  Diagnosis Date  . CAD (coronary artery disease) 7/07   Cath for unstable angina--- 50-70% LAD, 40% circ  . ED (erectile dysfunction)   . GERD (gastroesophageal reflux disease)   . Hx of colonic polyps   . Hyperlipidemia   . Hypertension   . Impaired fasting glucose   . Sleep disorder   . Wrist fracture     6 foot fall onto head,  concussion, L wrist (comminuted with intra-articular extension) and 2nd finger (middle phalanx) fxs, vertigo    Past Surgical History:  Procedure Laterality Date  . BRANCHIAL CLEFT CYST EXCISION    . HEMORRHOID SURGERY    . RHINOPLASTY      Family History  Problem Relation Age of Onset  . Coronary artery disease Mother   . Stroke Mother   . Coronary artery disease Brother   . Cancer Brother        colon or rectal  . Diabetes Neg Hx     Social History   Social History  . Marital status: Married    Spouse name: N/A  . Number of children: 4  . Years of education: N/A   Occupational History  . 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 Smoking status: Former Smoker    Years: 35.00    Types: Cigarettes    Quit date: 10/27/2005  . Smokeless tobacco: Never Used  . Alcohol use Yes     Comment: rare  . Drug use: No  . Sexual activity: Not on file   Other Topics Concern  . Not on file   Social History Narrative  . No narrative on file  Review of Systems  No fever No sweats or chills No other joint problems Scaly area on left forehead     Objective:   Physical Exam  Musculoskeletal:  Hands are puffy Still seems to have mild synovitis in PIPs  Skin:  78mm actinic in hairline on left forehead ~2.5cm flat pigmented area on left cheek---discussed dermatology evaluation (melasma variant)          Assessment & Plan:

## 2016-09-13 NOTE — Assessment & Plan Note (Addendum)
Will check uric acid--though pattern is not typical for gout If uric acid is very high--would try trial of colchicine 0.6 bid prn I am concerned about seronegative RA--but unwilling to give prednisone/MTX without rheumatology evaluation--will set this up

## 2016-09-13 NOTE — Assessment & Plan Note (Signed)
Single lesion treated with liquid nitrogen 45 seconds x 2 Discussed home care

## 2016-10-28 ENCOUNTER — Other Ambulatory Visit: Payer: Self-pay | Admitting: Internal Medicine

## 2016-12-13 DIAGNOSIS — M2550 Pain in unspecified joint: Secondary | ICD-10-CM | POA: Diagnosis not present

## 2016-12-13 DIAGNOSIS — R5383 Other fatigue: Secondary | ICD-10-CM | POA: Diagnosis not present

## 2016-12-13 DIAGNOSIS — M5410 Radiculopathy, site unspecified: Secondary | ICD-10-CM | POA: Diagnosis not present

## 2016-12-13 DIAGNOSIS — M150 Primary generalized (osteo)arthritis: Secondary | ICD-10-CM | POA: Diagnosis not present

## 2016-12-13 DIAGNOSIS — M1120 Other chondrocalcinosis, unspecified site: Secondary | ICD-10-CM | POA: Diagnosis not present

## 2017-01-18 ENCOUNTER — Other Ambulatory Visit: Payer: Self-pay | Admitting: Internal Medicine

## 2017-01-20 DIAGNOSIS — Z79899 Other long term (current) drug therapy: Secondary | ICD-10-CM | POA: Diagnosis not present

## 2017-01-20 DIAGNOSIS — M150 Primary generalized (osteo)arthritis: Secondary | ICD-10-CM | POA: Diagnosis not present

## 2017-01-20 DIAGNOSIS — M1120 Other chondrocalcinosis, unspecified site: Secondary | ICD-10-CM | POA: Diagnosis not present

## 2017-01-20 DIAGNOSIS — M2550 Pain in unspecified joint: Secondary | ICD-10-CM | POA: Diagnosis not present

## 2017-01-20 NOTE — Telephone Encounter (Signed)
Last filled 12/2014.Marland Kitchen Please advise

## 2017-01-21 NOTE — Telephone Encounter (Signed)
Approved: #30 x 1 

## 2017-01-28 DIAGNOSIS — H5712 Ocular pain, left eye: Secondary | ICD-10-CM | POA: Diagnosis not present

## 2017-01-28 DIAGNOSIS — S01112A Laceration without foreign body of left eyelid and periocular area, initial encounter: Secondary | ICD-10-CM | POA: Diagnosis not present

## 2017-02-04 ENCOUNTER — Encounter: Payer: Self-pay | Admitting: Internal Medicine

## 2017-02-04 NOTE — Telephone Encounter (Signed)
There are notes under Media from [place] but I cannot open them. It says the server cannot be reached. Not sure if it an issue with my computer.

## 2017-02-07 ENCOUNTER — Encounter: Payer: Self-pay | Admitting: Internal Medicine

## 2017-02-07 ENCOUNTER — Ambulatory Visit (INDEPENDENT_AMBULATORY_CARE_PROVIDER_SITE_OTHER): Payer: BLUE CROSS/BLUE SHIELD | Admitting: Internal Medicine

## 2017-02-07 VITALS — BP 126/76 | HR 65 | Temp 97.5°F | Ht 70.0 in | Wt 253.0 lb

## 2017-02-07 DIAGNOSIS — Z0000 Encounter for general adult medical examination without abnormal findings: Secondary | ICD-10-CM

## 2017-02-07 DIAGNOSIS — R7301 Impaired fasting glucose: Secondary | ICD-10-CM | POA: Diagnosis not present

## 2017-02-07 DIAGNOSIS — Z1211 Encounter for screening for malignant neoplasm of colon: Secondary | ICD-10-CM | POA: Diagnosis not present

## 2017-02-07 DIAGNOSIS — M79642 Pain in left hand: Secondary | ICD-10-CM

## 2017-02-07 DIAGNOSIS — I10 Essential (primary) hypertension: Secondary | ICD-10-CM | POA: Diagnosis not present

## 2017-02-07 DIAGNOSIS — G629 Polyneuropathy, unspecified: Secondary | ICD-10-CM

## 2017-02-07 DIAGNOSIS — I2510 Atherosclerotic heart disease of native coronary artery without angina pectoris: Secondary | ICD-10-CM | POA: Diagnosis not present

## 2017-02-07 DIAGNOSIS — L570 Actinic keratosis: Secondary | ICD-10-CM | POA: Diagnosis not present

## 2017-02-07 DIAGNOSIS — Z23 Encounter for immunization: Secondary | ICD-10-CM

## 2017-02-07 DIAGNOSIS — M79641 Pain in right hand: Secondary | ICD-10-CM

## 2017-02-07 LAB — GLUCOSE, RANDOM: Glucose, Bld: 121 mg/dL — ABNORMAL HIGH (ref 70–99)

## 2017-02-07 LAB — HEMOGLOBIN A1C: Hgb A1c MFr Bld: 6.3 % (ref 4.6–6.5)

## 2017-02-07 NOTE — Progress Notes (Signed)
Subjective:    Patient ID: [name], male    DOB: [birth year], 61 y.o.   MRN: [id number]  HPI Here for physical  Still with some hand pain Better with the diclofenac Still seeing rheum---Dr Trudie Reed  Needs spot on back of neck checked Doesn't see dermatologist lately  No heart trouble Keeps the nitro but has never needed it  Gained back the weight he lost Reviewed diet Not really exercising  Current Outpatient Prescriptions on File Prior to Visit  Medication Sig Dispense Refill  . aspirin 81 MG tablet Take 1 tablet (81 mg total) by mouth daily. 30 tablet 30  . diclofenac (VOLTAREN) 50 MG EC tablet TAKE 1 TABLET BY MOUTH TWICE A DAY 60 tablet 2  . fluticasone (FLONASE) 50 MCG/ACT nasal spray USE TWO SPRAY IN EACH NOSTRIL DAILY 16 g 11  . methocarbamol (ROBAXIN) 500 MG tablet Take 1 tablet (500 mg total) by mouth 2 (two) times daily. 20 tablet 0  . metoprolol (LOPRESSOR) 50 MG tablet TAKE 1 TABLET BY MOUTH DAILY 90 tablet 3  . nitroGLYCERIN (NITROSTAT) 0.4 MG SL tablet DISSOLVE 1 TABLET UNDER THE TONGUE FOR CHEST PAIN. MAY REPEAT EVERY 5MINUTES UP TO 3 DOSES. IF NO RELIEF, CALL 911** 30 tablet 1  . traMADol (ULTRAM) 50 MG tablet Take 1 tablet (50 mg total) by mouth 3 (three) times daily as needed. 30 tablet 0  . traZODone (DESYREL) 100 MG tablet Take 1-2 tablets (100-200 mg total) by mouth at bedtime as needed for sleep. 180 tablet 1   No current facility-administered medications on file prior to visit.     Allergies  Allergen Reactions  . Atorvastatin     REACTION: muscle aches  . Simvastatin     REACTION: hand pain  . Pravachol [Pravastatin Sodium]     Muscle aching    Past Medical History:  Diagnosis Date  . CAD (coronary artery disease) 7/07   Cath for unstable angina--- 50-70% LAD, 40% circ  . ED (erectile dysfunction)   . GERD (gastroesophageal reflux disease)   . Hx of colonic polyps   . Hyperlipidemia   . Hypertension   . Impaired fasting glucose   .  Sleep disorder   . Wrist fracture     6 foot fall onto head, concussion, L wrist (comminuted with intra-articular extension) and 2nd finger (middle phalanx) fxs, vertigo    Past Surgical History:  Procedure Laterality Date  . BRANCHIAL CLEFT CYST EXCISION    . HEMORRHOID SURGERY    . RHINOPLASTY      Family History  Problem Relation Age of Onset  . Coronary artery disease Mother   . Stroke Mother   . Coronary artery disease Brother   . Cancer Brother        colon or rectal  . Diabetes Neg Hx     Social History   Social History  . Marital status: Married    Spouse name: N/A  . Number of children: 4  . Years of education: N/A   Occupational History  . 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 Smoking status: Former Smoker    Years: 35.00    Types: Cigarettes    Quit date: 10/27/2005  . Smokeless tobacco: Never Used  . Alcohol use Yes     Comment: rare  . Drug use: No  . Sexual activity: Not on file   Other Topics Concern  . Not on file   Social History Narrative  .  No narrative on file   Review of Systems  Constitutional: Positive for unexpected weight change. Negative for fatigue.       Wears seat belt  HENT: Negative for dental problem, hearing loss and tinnitus.   Eyes: Negative for visual disturbance.       No diplopia or unilateral vision loss  Respiratory: Negative for cough, chest tightness and shortness of breath.   Cardiovascular: Negative for chest pain, palpitations and leg swelling.  Gastrointestinal: Negative for blood in stool and constipation.  Endocrine: Negative for polydipsia and polyuria.  Genitourinary: Negative for urgency.       Mild flow problems and dribbling No sig sexual problems  Musculoskeletal: Positive for arthralgias, back pain and joint swelling.  Skin: Negative for rash.       No suspicious lesions  Allergic/Immunologic: Positive for environmental allergies. Negative for immunocompromised state.        Meds help  Neurological: Negative for dizziness, syncope, light-headedness and headaches.  Hematological: Negative for adenopathy. Bruises/bleeds easily.  Psychiatric/Behavioral: Positive for sleep disturbance. Negative for dysphoric mood. The patient is not nervous/anxious.        Only takes the med for sleep rarely       Objective:   Physical Exam  Constitutional: He is oriented to person, place, and time. He appears well-developed and well-nourished. No distress.  HENT:  Head: Normocephalic and atraumatic.  Right Ear: External ear normal.  Left Ear: External ear normal.  Mouth/Throat: Oropharynx is clear and moist. No oropharyngeal exudate.  Eyes: Pupils are equal, round, and reactive to light. Conjunctivae are normal.  Neck: No thyromegaly present.  Cardiovascular: Normal rate, regular rhythm, normal heart sounds and intact distal pulses.  Exam reveals no gallop.   No murmur heard. Pulmonary/Chest: Effort normal and breath sounds normal. No respiratory distress. He has no wheezes. He has no rales.  Abdominal: Soft. There is no tenderness.  Musculoskeletal: He exhibits no edema or tenderness.  Lymphadenopathy:    He has no cervical adenopathy.  Neurological: He is alert and oriented to person, place, and time.  Skin: No rash noted. No erythema.  Psychiatric: He has a normal mood and affect. His behavior is normal.          Assessment & Plan:

## 2017-02-07 NOTE — Assessment & Plan Note (Signed)
BP Readings from Last 3 Encounters:  02/07/17 126/76  09/13/16 112/80  08/06/16 (!) 149/92   Good control

## 2017-02-07 NOTE — Assessment & Plan Note (Signed)
No symptoms Intolerant of statins On beta blocker and ASA

## 2017-02-07 NOTE — Assessment & Plan Note (Signed)
Needs to work on fitness and lose weight again DASH info Had flu vaccine Declines PSA after discussion Will set up colon

## 2017-02-07 NOTE — Assessment & Plan Note (Signed)
Suspicious lesion on occiput Recommended he go back to derm

## 2017-02-07 NOTE — Patient Instructions (Addendum)
Please set up with the dermatologist.  [place] stands for "Dietary Approaches to Stop Hypertension." The DASH eating plan is a healthy eating plan that has been shown to reduce high blood pressure (hypertension). It may also reduce your risk for type 2 diabetes, heart disease, and stroke. The DASH eating plan may also help with weight loss. What are tips for following this plan? General guidelines  Avoid eating more than 2,300 mg (milligrams) of salt (sodium) a day. If you have hypertension, you may need to reduce your sodium intake to 1,500 mg a day.  Limit alcohol intake to no more than 1 drink a day for nonpregnant women and 2 drinks a day for men. One drink equals 12 oz of beer, 5 oz of wine, or 1 oz of hard liquor.  Work with your health care provider to maintain a healthy body weight or to lose weight. Ask what an ideal weight is for you.  Get at least 30 minutes of exercise that causes your heart to beat faster (aerobic exercise) most days of the week. Activities may include walking, swimming, or biking.  Work with your health care provider or diet and nutrition specialist (dietitian) to adjust your eating plan to your individual calorie needs. Reading food labels  Check food labels for the amount of sodium per serving. Choose foods with less than 5 percent of the Daily Value of sodium. Generally, foods with less than 300 mg of sodium per serving fit into this eating plan.  To find whole grains, look for the word "whole" as the first word in the ingredient list. Shopping  Buy products labeled as "low-sodium" or "no salt added."  Buy fresh foods. Avoid canned foods and premade or frozen meals. Cooking  Avoid adding salt when cooking. Use salt-free seasonings or herbs instead of table salt or sea salt. Check with your health care provider or pharmacist before using salt substitutes.  Do not fry foods. Cook foods using healthy methods such as baking, boiling,  grilling, and broiling instead.  Cook with heart-healthy oils, such as olive, canola, soybean, or sunflower oil. Meal planning   Eat a balanced diet that includes: ? 5 or more servings of fruits and vegetables each day. At each meal, try to fill half of your plate with fruits and vegetables. ? Up to 6-8 servings of whole grains each day. ? Less than 6 oz of lean meat, poultry, or fish each day. A 3-oz serving of meat is about the same size as a deck of cards. One egg equals 1 oz. ? 2 servings of low-fat dairy each day. ? A serving of nuts, seeds, or beans 5 times each week. ? Heart-healthy fats. Healthy fats called Omega-3 fatty acids are found in foods such as flaxseeds and coldwater fish, like sardines, salmon, and mackerel.  Limit how much you eat of the following: ? Canned or prepackaged foods. ? Food that is high in trans fat, such as fried foods. ? Food that is high in saturated fat, such as fatty meat. ? Sweets, desserts, sugary drinks, and other foods with added sugar. ? Full-fat dairy products.  Do not salt foods before eating.  Try to eat at least 2 vegetarian meals each week.  Eat more home-cooked food and less restaurant, buffet, and fast food.  When eating at a restaurant, ask that your food be prepared with less salt or no salt, if possible. What foods are recommended? The items listed may not be a complete  list. Talk with your dietitian about what dietary choices are best for you. Grains Whole-grain or whole-wheat bread. Whole-grain or whole-wheat pasta. Brown rice. [place]. Bulgur. Whole-grain and low-sodium cereals. Pita bread. Low-fat, low-sodium crackers. Whole-wheat flour tortillas. Vegetables Fresh or frozen vegetables (raw, steamed, roasted, or grilled). Low-sodium or reduced-sodium tomato and vegetable juice. Low-sodium or reduced-sodium tomato sauce and tomato paste. Low-sodium or reduced-sodium canned vegetables. Fruits All fresh, dried, or frozen  fruit. Canned fruit in natural juice (without added sugar). Meat and other protein foods Skinless chicken or [place]. Ground chicken or [place]. Pork with fat trimmed off. Fish and seafood. Egg whites. Dried beans, peas, or lentils. Unsalted nuts, nut butters, and seeds. Unsalted canned beans. Lean cuts of beef with fat trimmed off. Low-sodium, lean deli meat. Dairy Low-fat (1%) or fat-free (skim) milk. Fat-free, low-fat, or reduced-fat cheeses. Nonfat, low-sodium ricotta or cottage cheese. Low-fat or nonfat yogurt. Low-fat, low-sodium cheese. Fats and oils Soft margarine without trans fats. Vegetable oil. Low-fat, reduced-fat, or light mayonnaise and salad dressings (reduced-sodium). Canola, safflower, olive, soybean, and sunflower oils. Avocado. Seasoning and other foods Herbs. Spices. Seasoning mixes without salt. Unsalted popcorn and pretzels. Fat-free sweets. What foods are not recommended? The items listed may not be a complete list. Talk with your dietitian about what dietary choices are best for you. Grains Baked goods made with fat, such as croissants, muffins, or some breads. Dry pasta or rice meal packs. Vegetables Creamed or fried vegetables. Vegetables in a cheese sauce. Regular canned vegetables (not low-sodium or reduced-sodium). Regular canned tomato sauce and paste (not low-sodium or reduced-sodium). Regular tomato and vegetable juice (not low-sodium or reduced-sodium). Angie Fava. Olives. Fruits Canned fruit in a light or heavy syrup. Fried fruit. Fruit in cream or butter sauce. Meat and other protein foods Fatty cuts of meat. Ribs. Fried meat. Berniece Salines. Sausage. Bologna and other processed lunch meats. Salami. Fatback. Hotdogs. Bratwurst. Salted nuts and seeds. Canned beans with added salt. Canned or smoked fish. Whole eggs or egg yolks. Chicken or [place] with skin. Dairy Whole or 2% milk, cream, and half-and-half. Whole or full-fat cream cheese. Whole-fat or sweetened yogurt. Full-fat  cheese. Nondairy creamers. Whipped toppings. Processed cheese and cheese spreads. Fats and oils Butter. Stick margarine. Lard. Shortening. Ghee. Bacon fat. Tropical oils, such as coconut, palm kernel, or palm oil. Seasoning and other foods Salted popcorn and pretzels. Onion salt, garlic salt, seasoned salt, table salt, and sea salt. Worcestershire sauce. Tartar sauce. Barbecue sauce. Teriyaki sauce. Soy sauce, including reduced-sodium. Steak sauce. Canned and packaged gravies. Fish sauce. Oyster sauce. Cocktail sauce. Horseradish that you find on the shelf. Ketchup. Mustard. Meat flavorings and tenderizers. Bouillon cubes. Hot sauce and Tabasco sauce. Premade or packaged marinades. Premade or packaged taco seasonings. Relishes. Regular salad dressings. Where to find more information:  National Heart, Lung, and [place]: https://wilson-eaton.com/  American Heart Association: www.heart.org Summary  The DASH eating plan is a healthy eating plan that has been shown to reduce high blood pressure (hypertension). It may also reduce your risk for type 2 diabetes, heart disease, and stroke.  With the DASH eating plan, you should limit salt (sodium) intake to 2,300 mg a day. If you have hypertension, you may need to reduce your sodium intake to 1,500 mg a day.  When on the DASH eating plan, aim to eat more fresh fruits and vegetables, whole grains, lean proteins, low-fat dairy, and heart-healthy fats.  Work with your health care provider or diet and nutrition specialist (dietitian) to  adjust your eating plan to your individual calorie needs. This information is not intended to replace advice given to you by your health care provider. Make sure you discuss any questions you have with your health care provider. Document Released: 04/04/2011 Document Revised: 04/08/2016 Document Reviewed: 04/08/2016 Elsevier Interactive Patient Education  2017 [employer].

## 2017-02-07 NOTE — Assessment & Plan Note (Signed)
Will check labs Consider trying metformin again if worse

## 2017-02-07 NOTE — Addendum Note (Signed)
Addended by: Pilar Grammes on: 02/07/2017 09:26 AM   Modules accepted: Orders

## 2017-02-07 NOTE — Assessment & Plan Note (Signed)
Better with diclofenac

## 2017-02-07 NOTE — Assessment & Plan Note (Signed)
Better lately Notices if on his feet all day

## 2017-02-14 LAB — HM DIABETES EYE EXAM

## 2017-03-05 ENCOUNTER — Other Ambulatory Visit: Payer: Self-pay | Admitting: Internal Medicine

## 2017-03-06 ENCOUNTER — Encounter: Payer: Self-pay | Admitting: Internal Medicine

## 2017-03-06 ENCOUNTER — Telehealth: Payer: Self-pay | Admitting: Internal Medicine

## 2017-03-06 ENCOUNTER — Other Ambulatory Visit: Payer: Self-pay | Admitting: Internal Medicine

## 2017-03-06 MED ORDER — OMEPRAZOLE 20 MG PO CPDR: 20.0000 mg | DELAYED_RELEASE_CAPSULE | Freq: Every day | ORAL | 3 refills | Status: DC

## 2017-03-06 NOTE — Telephone Encounter (Signed)
Pt has CPE 01/2017 but med not on current list. Denied 11/9

## 2017-03-06 NOTE — Telephone Encounter (Signed)
Spoke to pt's wife. Advised her at his recent OV, pt informed us he was not taking the medication, so it was removed from his list. I have sent in the refill.

## 2017-03-06 NOTE — Telephone Encounter (Signed)
Copied from [place] [id number]. Topic: Quick Communication - See Telephone Encounter >> Mar 06, 2017 11:45 AM Boyd Kerbs wrote: CRM for notification. See Telephone encounter for:  Pharmacy says the doctor office is declining the prescription. He is out Omeprazole Heart burn.   03/06/17.

## 2017-03-06 NOTE — Telephone Encounter (Signed)
This has been on his list for years. I don't know how it was taken off but it should not have been denied without checking this (and confirming with me). Okay to refill for a year

## 2017-03-31 ENCOUNTER — Other Ambulatory Visit: Payer: Self-pay

## 2017-03-31 ENCOUNTER — Ambulatory Visit (AMBULATORY_SURGERY_CENTER): Payer: Self-pay | Admitting: *Deleted

## 2017-03-31 VITALS — Ht 70.0 in | Wt 257.0 lb

## 2017-03-31 DIAGNOSIS — Z1211 Encounter for screening for malignant neoplasm of colon: Secondary | ICD-10-CM

## 2017-03-31 MED ORDER — PLENVU 140 G PO SOLR: 1.0000 | ORAL | 0 refills | Status: DC

## 2017-03-31 NOTE — Progress Notes (Signed)
Patient denies any allergies to eggs or soy. Patient denies any problems with anesthesia/sedation. Patient denies any oxygen use at home. Patient denies taking any diet/weight loss medications or blood thinners. EMMI education assisgned to patient on colonoscopy, this was explained and instructions given to patient. 

## 2017-04-14 ENCOUNTER — Other Ambulatory Visit: Payer: Self-pay

## 2017-04-14 ENCOUNTER — Encounter: Payer: Self-pay | Admitting: Gastroenterology

## 2017-04-14 ENCOUNTER — Ambulatory Visit (AMBULATORY_SURGERY_CENTER): Payer: BLUE CROSS/BLUE SHIELD | Admitting: Gastroenterology

## 2017-04-14 VITALS — BP 130/57 | HR 64 | Temp 97.1°F | Resp 20 | Ht 70.0 in | Wt 253.0 lb

## 2017-04-14 DIAGNOSIS — Z1211 Encounter for screening for malignant neoplasm of colon: Secondary | ICD-10-CM

## 2017-04-14 DIAGNOSIS — Z1212 Encounter for screening for malignant neoplasm of rectum: Secondary | ICD-10-CM | POA: Diagnosis not present

## 2017-04-14 DIAGNOSIS — Z86010 Personal history of colonic polyps: Secondary | ICD-10-CM | POA: Diagnosis not present

## 2017-04-14 MED ORDER — SODIUM CHLORIDE 0.9 % IV SOLN: 500.0000 mL | Freq: Once | INTRAVENOUS | Status: DC

## 2017-04-14 NOTE — Patient Instructions (Signed)
YOU HAD AN ENDOSCOPIC PROCEDURE TODAY AT [facility]:   Refer to the procedure report that was given to you for any specific questions about what was found during the examination.  If the procedure report does not answer your questions, please call your gastroenterologist to clarify.  If you requested that your care partner not be given the details of your procedure findings, then the procedure report has been included in a sealed envelope for you to review at your convenience later.  YOU SHOULD EXPECT: Some feelings of bloating in the abdomen. Passage of more gas than usual.  Walking can help get rid of the air that was put into your GI tract during the procedure and reduce the bloating. If you had a lower endoscopy (such as a colonoscopy or flexible sigmoidoscopy) you may notice spotting of blood in your stool or on the toilet paper. If you underwent a bowel prep for your procedure, you may not have a normal bowel movement for a few days.  Please Note:  You might notice some irritation and congestion in your nose or some drainage.  This is from the oxygen used during your procedure.  There is no need for concern and it should clear up in a day or so.  SYMPTOMS TO REPORT IMMEDIATELY:   Following lower endoscopy (colonoscopy or flexible sigmoidoscopy):  Excessive amounts of blood in the stool  Significant tenderness or worsening of abdominal pains  Swelling of the abdomen that is new, acute  Fever of 100F or higher  Please see handouts on Diverticulosis.  For urgent or emergent issues, a gastroenterologist can be reached at any hour by calling [phone].   DIET:  We do recommend a small meal at first, but then you may proceed to your regular diet.  Drink plenty of fluids but you should avoid alcoholic beverages for 24 hours.  ACTIVITY:  You should plan to take it easy for the rest of today and you should NOT DRIVE or use heavy machinery until tomorrow (because of the  sedation medicines used during the test).    FOLLOW UP: Our staff will call the number listed on your records the next business day following your procedure to check on you and address any questions or concerns that you may have regarding the information given to you following your procedure. If we do not reach you, we will leave a message.  However, if you are feeling well and you are not experiencing any problems, there is no need to return our call.  We will assume that you have returned to your regular daily activities without incident.  If any biopsies were taken you will be contacted by phone or by letter within the next 1-3 weeks.  Please call us at [phone] if you have not heard about the biopsies in 3 weeks.    SIGNATURES/CONFIDENTIALITY: You and/or your care partner have signed paperwork which will be entered into your electronic medical record.  These signatures attest to the fact that that the information above on your After Visit Summary has been reviewed and is understood.  Full responsibility of the confidentiality of this discharge information lies with you and/or your care-partner.  Thank you for letting us take care of your healthcare needs today.

## 2017-04-14 NOTE — Op Note (Signed)
[facility] Patient Name: [name] Procedure Date: 04/14/2017 1:18 PM MRN: [id number] Endoscopist: Mallie Mussel L. Loletha Carrow , MD Age: 61 Referring MD:  Date of Birth: 05-Mar-1956 Gender: Male Account #: [id number] Procedure:                Colonoscopy Indications:              Screening for colorectal malignant neoplasm (no                            polyps on 2008 colonoscopy) Medicines:                Monitored Anesthesia Care Procedure:                Pre-Anesthesia Assessment:                           - Prior to the procedure, a History and Physical                            was performed, and patient medications and                            allergies were reviewed. The patient's tolerance of                            previous anesthesia was also reviewed. The risks                            and benefits of the procedure and the sedation                            options and risks were discussed with the patient.                            All questions were answered, and informed consent                            was obtained. Prior Anticoagulants: The patient has                            taken no previous anticoagulant or antiplatelet                            agents. ASA Grade Assessment: II - A patient with                            mild systemic disease. After reviewing the risks                            and benefits, the patient was deemed in                            satisfactory condition to undergo the procedure.  After obtaining informed consent, the colonoscope                            was passed under direct vision. Throughout the                            procedure, the patient's blood pressure, pulse, and                            oxygen saturations were monitored continuously. The                            Model CF-HQ190L [id number]) scope was introduced                            through the anus and advanced to the  the cecum,                            identified by appendiceal orifice and ileocecal                            valve. The colonoscopy was performed without                            difficulty. The patient tolerated the procedure                            well. The quality of the bowel preparation was                            excellent. The ileocecal valve, appendiceal                            orifice, and rectum were photographed. The quality                            of the bowel preparation was evaluated using the                            BBPS [facility] Bowel Preparation Scale) with scores                            of: Right Colon = 3, Transverse Colon = 3 and Left                            Colon = 3 (entire mucosa seen well with no residual                            staining, small fragments of stool or opaque                            liquid). The total BBPS score equals 9. The bowel  preparation used was SUPREP. Scope In: 1:23:15 PM Scope Out: 1:35:41 PM Scope Withdrawal Time: 0 hours 9 minutes 22 seconds  Total Procedure Duration: 0 hours 12 minutes 26 seconds  Findings:                 The perianal and digital rectal examinations were                            normal.                           Multiple diverticula were found in the left colon.                           The exam was otherwise without abnormality on                            direct and retroflexion views. Complications:            No immediate complications. Estimated Blood Loss:     Estimated blood loss: none. Impression:               - Diverticulosis in the left colon.                           - The examination was otherwise normal on direct                            and retroflexion views.                           - No specimens collected. Recommendation:           - Patient has a contact number available for                            emergencies. The signs and  symptoms of potential                            delayed complications were discussed with the                            patient. Return to normal activities tomorrow.                            Written discharge instructions were provided to the                            patient.                           - Resume previous diet.                           - Continue present medications.                           - Repeat colonoscopy in 10 years for screening  purposes. Nialah Saravia L. Loletha Carrow, MD 04/14/2017 1:38:24 PM This report has been signed electronically.

## 2017-04-14 NOTE — Progress Notes (Signed)
Pt's states no medical or surgical changes since previsit or office visit. 

## 2017-04-14 NOTE — Progress Notes (Signed)
Report given to PACU, vss 

## 2017-04-15 ENCOUNTER — Telehealth: Payer: Self-pay

## 2017-04-15 ENCOUNTER — Other Ambulatory Visit: Payer: Self-pay | Admitting: Internal Medicine

## 2017-04-15 NOTE — Telephone Encounter (Signed)
  Follow up Call-  Call back number 04/14/2017  Post procedure Call Back phone  # [phone]  Permission to leave phone message Yes  Some recent data might be hidden     Patient questions:  Do you have a fever, pain , or abdominal swelling? No. Pain Score  0 *  Have you tolerated food without any problems? Yes.    Have you been able to return to your normal activities? Yes.    Do you have any questions about your discharge instructions: Diet   No. Medications  No. Follow up visit  No.  Do you have questions or concerns about your Care? No.  Actions: * If pain score is 4 or above: No action needed, pain <4.

## 2017-04-15 NOTE — Telephone Encounter (Signed)
Added a second telephone call by error

## 2017-08-15 ENCOUNTER — Telehealth: Payer: Self-pay | Admitting: Internal Medicine

## 2017-08-15 NOTE — Telephone Encounter (Signed)
LOV  02/07/18 Dr. Silvio Pate Pt. States Voltaren increased by Dr. Trudie Reed. See request.

## 2017-08-15 NOTE — Telephone Encounter (Signed)
Copied from [place] [id number]. Topic: Quick Communication - Rx Refill/Question >> Aug 15, 2017  1:21 PM Yvette Rack wrote: Medication: diclofenac (VOLTAREN) 75 MG EC tablet Dr Gavin Pound rheumatologist had changed the dose of medicine Has the patient contacted their pharmacy? Yes.  Reached out to provider three times (Agent: If no, request that the patient contact the pharmacy for the refill.) Preferred Pharmacy (with phone number or street name):     [id number] - [place], [place] [phone] (Phone) [phone] (Fax)     Agent: Please be advised that RX refills may take up to 3 business days. We ask that you follow-up with your pharmacy.

## 2017-08-18 MED ORDER — DICLOFENAC SODIUM 75 MG PO TBEC: 75.0000 mg | DELAYED_RELEASE_TABLET | Freq: Two times a day (BID) | ORAL | 11 refills | Status: DC

## 2017-08-18 NOTE — Telephone Encounter (Signed)
Spoke to wife, Designer, fashion/clothing, per [employer]. Rx has been sent in.

## 2017-08-18 NOTE — Telephone Encounter (Signed)
Pt is calling from [place] and does not know the increased dosage of voltaren; pt will have wife call with that info. [facility] [place]. Pt requesting refill of voltaren.Marland Kitchen

## 2017-08-18 NOTE — Telephone Encounter (Signed)
Dr Trudie Reed changed him to 75mg  twice a day. [facility] told [facility] they have reached out to Dr Trudie Reed' office several times for refills and they are not responding. The patient or wife has not tried to reach them. Asking Dr Silvio Pate to do the rx because they cannot get it from Dr Trudie Reed.

## 2017-08-18 NOTE — Telephone Encounter (Signed)
Lm on pts vm requesting a call back to confirm dosing of voltaren, if it has been increased by another provider

## 2017-08-18 NOTE — Telephone Encounter (Signed)
Approved: okay to refill for a year 

## 2017-08-18 NOTE — Telephone Encounter (Signed)
Please call wife back - returning call about prescription.

## 2018-02-13 ENCOUNTER — Other Ambulatory Visit: Payer: Self-pay | Admitting: Internal Medicine

## 2018-02-13 ENCOUNTER — Ambulatory Visit (INDEPENDENT_AMBULATORY_CARE_PROVIDER_SITE_OTHER): Payer: BLUE CROSS/BLUE SHIELD | Admitting: Internal Medicine

## 2018-02-13 ENCOUNTER — Encounter

## 2018-02-13 ENCOUNTER — Encounter: Payer: Self-pay | Admitting: Internal Medicine

## 2018-02-13 VITALS — BP 136/84 | HR 64 | Temp 97.6°F | Ht 70.0 in | Wt 248.0 lb

## 2018-02-13 DIAGNOSIS — R7303 Prediabetes: Secondary | ICD-10-CM | POA: Diagnosis not present

## 2018-02-13 DIAGNOSIS — G629 Polyneuropathy, unspecified: Secondary | ICD-10-CM | POA: Diagnosis not present

## 2018-02-13 DIAGNOSIS — Z0000 Encounter for general adult medical examination without abnormal findings: Secondary | ICD-10-CM

## 2018-02-13 DIAGNOSIS — I2510 Atherosclerotic heart disease of native coronary artery without angina pectoris: Secondary | ICD-10-CM | POA: Diagnosis not present

## 2018-02-13 DIAGNOSIS — I10 Essential (primary) hypertension: Secondary | ICD-10-CM

## 2018-02-13 DIAGNOSIS — E785 Hyperlipidemia, unspecified: Secondary | ICD-10-CM

## 2018-02-13 LAB — COMPREHENSIVE METABOLIC PANEL
ALT: 44 U/L (ref 0–53)
AST: 29 U/L (ref 0–37)
Albumin: 4.3 g/dL (ref 3.5–5.2)
Alkaline Phosphatase: 92 U/L (ref 39–117)
BUN: 14 mg/dL (ref 6–23)
CO2: 29 mEq/L (ref 19–32)
Calcium: 9.6 mg/dL (ref 8.4–10.5)
Chloride: 103 mEq/L (ref 96–112)
Creatinine, Ser: 1.04 mg/dL (ref 0.40–1.50)
GFR: 76.69 mL/min (ref >60.00)
Glucose, Bld: 123 mg/dL — ABNORMAL HIGH (ref 70–99)
Potassium: 4.2 mEq/L (ref 3.5–5.1)
Sodium: 140 mEq/L (ref 135–145)
Total Bilirubin: 1 mg/dL (ref 0.2–1.2)
Total Protein: 7.6 g/dL (ref 6.0–8.3)

## 2018-02-13 LAB — LIPID PANEL
Cholesterol: 212 mg/dL — ABNORMAL HIGH (ref 0–200)
HDL: 49.8 mg/dL (ref >39.00)
LDL Cholesterol: 146 mg/dL — ABNORMAL HIGH (ref 0–99)
NonHDL: 162.63
Total CHOL/HDL Ratio: 4
Triglycerides: 82 mg/dL (ref 0.0–149.0)
VLDL: 16.4 mg/dL (ref 0.0–40.0)

## 2018-02-13 LAB — VITAMIN B12: Vitamin B-12: 443 pg/mL (ref 211–911)

## 2018-02-13 LAB — CBC
HCT: 47.3 % (ref 39.0–52.0)
Hemoglobin: 16 g/dL (ref 13.0–17.0)
MCHC: 33.9 g/dL (ref 30.0–36.0)
MCV: 90.1 fl (ref 78.0–100.0)
Platelets: 238 10*3/uL (ref 150.0–400.0)
RBC: 5.25 Mil/uL (ref 4.22–5.81)
RDW: 13.3 % (ref 11.5–15.5)
WBC: 7.4 10*3/uL (ref 4.0–10.5)

## 2018-02-13 LAB — HEMOGLOBIN A1C: Hgb A1c MFr Bld: 6.3 % (ref 4.6–6.5)

## 2018-02-13 LAB — T4, FREE: Free T4: 1.1 ng/dL (ref 0.60–1.60)

## 2018-02-13 MED ORDER — FLUTICASONE PROPIONATE 50 MCG/ACT NA SUSP: NASAL | 11 refills | Status: DC

## 2018-02-13 MED ORDER — METFORMIN HCL ER 500 MG PO TB24: 500.0000 mg | ORAL_TABLET | Freq: Every day | ORAL | 3 refills | Status: DC

## 2018-02-13 NOTE — Assessment & Plan Note (Signed)
?   From the sugars Will check labs though

## 2018-02-13 NOTE — Assessment & Plan Note (Signed)
Didn't tolerate multiple statins On fish oil

## 2018-02-13 NOTE — Assessment & Plan Note (Signed)
Healthy but not working on fitness Weight is down slightly Recent colonoscopy Discussed PSA--he will hold off Had flu vaccine at work

## 2018-02-13 NOTE — Assessment & Plan Note (Signed)
No symptoms 

## 2018-02-13 NOTE — Assessment & Plan Note (Signed)
BP Readings from Last 3 Encounters:  02/13/18 136/84  04/14/17 (!) 130/57  02/07/17 126/76   Reasonable control Due for labs

## 2018-02-13 NOTE — Assessment & Plan Note (Signed)
If still up, will plan to start metformin 500mg 

## 2018-02-13 NOTE — Progress Notes (Signed)
Subjective:    Patient ID: [name], male    DOB: 20-Oct-1955, 62 y.o.   MRN: [id number]  HPI Here for physical Has lost some weight---but not exercising (active at work)  Same arthritis--hands and up arms some Uses the diclofenac regularly No longer sees rheumatologist  Takes omeprazole every other day No heartburn or dysphagia  Current Outpatient Medications on File Prior to Visit  Medication Sig Dispense Refill  . aspirin EC 81 MG tablet Take 81 mg by mouth daily.    . diclofenac (VOLTAREN) 75 MG EC tablet Take 1 tablet (75 mg total) by mouth 2 (two) times daily. 60 tablet 11  . fluticasone (FLONASE) 50 MCG/ACT nasal spray USE TWO SPRAY IN EACH NOSTRIL DAILY 16 g 11  . loratadine (CLARITIN) 10 MG tablet Take 10 mg by mouth daily.    . metoprolol tartrate (LOPRESSOR) 50 MG tablet TAKE 1 TABLET BY MOUTH DAILY 90 tablet 3  . Multiple Vitamin (MULTIVITAMIN) tablet Take 1 tablet by mouth daily.    . nitroGLYCERIN (NITROSTAT) 0.4 MG SL tablet DISSOLVE 1 TABLET UNDER THE TONGUE FOR CHEST PAIN. MAY REPEAT EVERY 5MINUTES UP TO 3 DOSES. IF NO RELIEF, CALL 911** 30 tablet 1  . Omega-3 Fatty Acids (OMEGA-3 FISH OIL PO) Take 1,000 mg by mouth daily.    Marland Kitchen omeprazole (PRILOSEC) 20 MG capsule Take 1 capsule (20 mg total) daily by mouth. 90 capsule 3  . traZODone (DESYREL) 100 MG tablet Take 1-2 tablets (100-200 mg total) by mouth at bedtime as needed for sleep. 180 tablet 1   No current facility-administered medications on file prior to visit.     Allergies  Allergen Reactions  . Atorvastatin     REACTION: muscle aches  . Simvastatin     REACTION: hand pain  . Pravachol [Pravastatin Sodium]     Muscle aching    Past Medical History:  Diagnosis Date  . CAD (coronary artery disease) 7/07   Cath for unstable angina--- 50-70% LAD, 40% circ  . ED (erectile dysfunction)   . GERD (gastroesophageal reflux disease)   . Hx of colonic polyps   . Hyperlipidemia   . Hypertension   .  Impaired fasting glucose   . Sleep disorder   . Wrist fracture     6 foot fall onto head, concussion, L wrist (comminuted with intra-articular extension) and 2nd finger (middle phalanx) fxs, vertigo    Past Surgical History:  Procedure Laterality Date  . BRANCHIAL CLEFT CYST EXCISION    . COLONOSCOPY  04/03/2007  . HEMORRHOID SURGERY    . RHINOPLASTY      Family History  Problem Relation Age of Onset  . Coronary artery disease Mother   . Stroke Mother   . Coronary artery disease Brother   . Cancer Brother        colon or rectal  . Colon cancer Brother 48       half-brother   . Diabetes Neg Hx   . Stomach cancer Neg Hx     Social History   Socioeconomic History  . Marital status: Married    Spouse name: Not on file  . Number of children: 4  . Years of education: Not on file  . Highest education level: Not on file  Occupational History  . Occupation: installs signs    Employer: [employer]  . Financial resource strain: Not on file  . Food insecurity:    Worry: Not on file  Inability: Not on file  . Transportation needs:    Medical: Not on file    Non-medical: Not on file  Tobacco Use  . Smoking status: Former Smoker    Years: 35.00    Types: Cigarettes    Last attempt to quit: 10/27/2005    Years since quitting: 12.3  . Smokeless tobacco: Never Used  Substance and Sexual Activity  . Alcohol use: Yes    Comment: once a month-beer   . Drug use: No  . Sexual activity: Not on file  Lifestyle  . Physical activity:    Days per week: Not on file    Minutes per session: Not on file  . Stress: Not on file  Relationships  . Social connections:    Talks on phone: Not on file    Gets together: Not on file    Attends religious service: Not on file    Active member of club or organization: Not on file    Attends meetings of clubs or organizations: Not on file    Relationship status: Not on file  . Intimate partner violence:    Fear of current  or ex partner: Not on file    Emotionally abused: Not on file    Physically abused: Not on file    Forced sexual activity: Not on file  Other Topics Concern  . Not on file  Social History Narrative  . Not on file   Review of Systems  Constitutional: Negative for fatigue.       Wears seat belt  HENT: Negative for dental problem, hearing loss, tinnitus and trouble swallowing.        Keeps up with dentist  Eyes: Negative for visual disturbance.       No diplopia or unilateral vision loss  Respiratory: Negative for cough, chest tightness and shortness of breath.   Cardiovascular: Negative for chest pain and palpitations.       Occasional left leg ankle swelling and aching if up for a while---discussed support socks  Gastrointestinal: Negative for blood in stool and constipation.  Endocrine: Negative for polydipsia and polyuria.  Genitourinary: Negative for difficulty urinating and urgency.       No sexual problems  Musculoskeletal: Positive for arthralgias. Negative for joint swelling.       Occ mild back pain  Skin: Negative for rash.       No suspicious lesions  Allergic/Immunologic: Positive for environmental allergies. Negative for immunocompromised state.  Neurological: Negative for dizziness, syncope, light-headedness and headaches.       Feet are tingling now--fairly regularly  Hematological: Negative for adenopathy. Does not bruise/bleed easily.  Psychiatric/Behavioral: Negative for dysphoric mood. The patient is not nervous/anxious.        Uses the trazodone only occasionally       Objective:   Physical Exam  Constitutional: He is oriented to person, place, and time. He appears well-developed. No distress.  HENT:  Head: Normocephalic and atraumatic.  Right Ear: External ear normal.  Left Ear: External ear normal.  Mouth/Throat: Oropharynx is clear and moist. No oropharyngeal exudate.  Eyes: Pupils are equal, round, and reactive to light. Conjunctivae are normal.    Neck: No thyromegaly present.  Cardiovascular: Normal rate, regular rhythm, normal heart sounds and intact distal pulses. Exam reveals no gallop.  No murmur heard. Respiratory: Effort normal and breath sounds normal. No respiratory distress. He has no wheezes. He has no rales.  GI: Soft. There is no tenderness.  Musculoskeletal: He exhibits  no edema or tenderness.  Lymphadenopathy:    He has no cervical adenopathy.  Neurological: He is alert and oriented to person, place, and time.  Skin: No rash noted. No erythema.  Psychiatric: He has a normal mood and affect. His behavior is normal.           Assessment & Plan:

## 2018-02-19 ENCOUNTER — Other Ambulatory Visit: Payer: Self-pay | Admitting: Internal Medicine

## 2018-03-27 ENCOUNTER — Ambulatory Visit (HOSPITAL_COMMUNITY)
Admission: EM | Admit: 2018-03-27 | Discharge: 2018-03-27 | Disposition: A | Discharge status: Home / Self Care | Payer: BLUE CROSS/BLUE SHIELD | Attending: Family Medicine | Admitting: Family Medicine

## 2018-03-27 ENCOUNTER — Encounter (HOSPITAL_COMMUNITY): Payer: Self-pay

## 2018-03-27 DIAGNOSIS — B9789 Other viral agents as the cause of diseases classified elsewhere: Secondary | ICD-10-CM | POA: Diagnosis not present

## 2018-03-27 DIAGNOSIS — J069 Acute upper respiratory infection, unspecified: Secondary | ICD-10-CM | POA: Diagnosis not present

## 2018-03-27 MED ORDER — BENZONATATE 100 MG PO CAPS: 100.0000 mg | ORAL_CAPSULE | Freq: Three times a day (TID) | ORAL | 0 refills | Status: DC

## 2018-03-27 NOTE — Discharge Instructions (Signed)
Continue taking the Coricidin We will add on Tessalon Perles to help with the cough If you are not better over the next couple days please follow-up with your doctor on Monday

## 2018-03-27 NOTE — ED Provider Notes (Signed)
[place]    CSN: [id number] Arrival date & time: 03/27/18  5035     History   Chief Complaint Chief Complaint  Patient presents with  . Cough    HPI [name] is a 62 y.o. male.   Patient is a 62 year old male presents with cough for proximally 6 days.  The cough is been persistent.  He has been using over-the-counter Coricidin for symptoms.  This seems to help at night but he has persistent cough at the day.  Reports initially he was coughing up congestion but now is more of a dry hacking cough.  He denies any associated fevers, chills, body aches, fatigue.  He denies any nausea, vomiting, diarrhea.  ROS per HPI      Past Medical History:  Diagnosis Date  . CAD (coronary artery disease) 7/07   Cath for unstable angina--- 50-70% LAD, 40% circ  . ED (erectile dysfunction)   . GERD (gastroesophageal reflux disease)   . Hx of colonic polyps   . Hyperlipidemia   . Hypertension   . Impaired fasting glucose   . Sleep disorder   . Wrist fracture     6 foot fall onto head, concussion, L wrist (comminuted with intra-articular extension) and 2nd finger (middle phalanx) fxs, vertigo    Patient Active Problem List   Diagnosis Date Noted  . Actinic keratoses 09/13/2016  . Bilateral hand pain 05/30/2016  . Neuropathy ([place]) 01/26/2016  . RBBB 09/02/2014  . CAD (coronary artery disease)   . Routine general medical examination at a health care facility 11/29/2011  . Prediabetes 12/02/2008  . Essential hypertension, benign 05/16/2007  . DIVERTICULOSIS, COLON 05/16/2007  . COLONIC POLYPS, HYPERPLASTIC 04/03/2007  . Hyperlipemia 11/25/2006  . GERD 11/25/2006  . 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 Sleep disturbance 11/25/2006    Past Surgical History:  Procedure Laterality Date  . BRANCHIAL CLEFT CYST EXCISION    . COLONOSCOPY  04/03/2007  . HEMORRHOID SURGERY    . RHINOPLASTY         Home Medications    Prior to Admission medications     Medication Sig Start Date End Date Taking? Authorizing Provider  aspirin EC 81 MG tablet Take 81 mg by mouth daily.    [provider]  benzonatate (TESSALON) 100 MG capsule Take 1 capsule (100 mg total) by mouth every 8 (eight) hours. 03/27/18   Loura Halt A, NP  diclofenac (VOLTAREN) 75 MG EC tablet Take 1 tablet (75 mg total) by mouth 2 (two) times daily. 08/18/17   Venia Carbon, MD  fluticasone Asencion Islam) 50 MCG/ACT nasal spray USE TWO SPRAY IN EACH NOSTRIL DAILY 02/13/18   Venia Carbon, MD  loratadine (CLARITIN) 10 MG tablet Take 10 mg by mouth daily.    [provider]  metFORMIN (GLUCOPHAGE-XR) 500 MG 24 hr tablet Take 1 tablet (500 mg total) by mouth daily with breakfast. 02/13/18   Venia Carbon, MD  metoprolol tartrate (LOPRESSOR) 50 MG tablet TAKE 1 TABLET BY MOUTH DAILY 04/15/17   Venia Carbon, MD  Multiple Vitamin (MULTIVITAMIN) tablet Take 1 tablet by mouth daily.    [provider]  nitroGLYCERIN (NITROSTAT) 0.4 MG SL tablet DISSOLVE 1 TABLET UNDER THE TONGUE FOR CHEST PAIN. MAY REPEAT EVERY 5MINUTES UP TO 3 DOSES. IF NO RELIEF, CALL 911** 01/21/17   Venia Carbon, MD  Omega-3 Fatty Acids (OMEGA-3 FISH OIL PO) Take 1,000 mg by mouth daily.    [provider]  omeprazole (PRILOSEC) 20 MG capsule TAKE ONE CAPSULE BY MOUTH DAILY 02/19/18   Venia Carbon, MD  traZODone (DESYREL) 100 MG tablet Take 1-2 tablets (100-200 mg total) by mouth at bedtime as needed for sleep. 09/06/15   Venia Carbon, MD    Family History Family History  Problem Relation Age of Onset  . Coronary artery disease Mother   . Stroke Mother   . Coronary artery disease Brother   . Cancer Brother        colon or rectal  . Colon cancer Brother 32       half-brother   . Diabetes Neg Hx   . Stomach cancer Neg Hx     Social History Social History   Tobacco Use  . Smoking status: Former Smoker    Years: 35.00    Types: Cigarettes    Last  attempt to quit: 10/27/2005    Years since quitting: 12.4  . Smokeless tobacco: Never Used  Substance Use Topics  . Alcohol use: Yes    Comment: once a month-beer   . Drug use: No     Allergies   Atorvastatin; Simvastatin; and Pravachol [pravastatin sodium]   Review of Systems Review of Systems   Physical Exam Triage Vital Signs ED Triage Vitals  Enc Vitals Group     BP 03/27/18 0833 131/82     Pulse Rate 03/27/18 0833 64     Resp 03/27/18 0833 18     Temp 03/27/18 0833 (!) 97 F (36.1 C)     Temp Source 03/27/18 0833 Oral     SpO2 03/27/18 0833 93 %     Weight --      Height --      Head Circumference --      Peak Flow --      Pain Score 03/27/18 0835 3     Pain Loc --      Pain Edu? --      Excl. in [place]? --    No data found.  Updated Vital Signs BP 131/82 (BP Location: Left Arm)   Pulse 64   Temp (!) 97 F (36.1 C) (Oral)   Resp 18   SpO2 93%   Visual Acuity Right Eye Distance:   Left Eye Distance:   Bilateral Distance:    Right Eye Near:   Left Eye Near:    Bilateral Near:     Physical Exam  Constitutional: He appears well-developed and well-nourished.  HENT:  Head: Normocephalic and atraumatic.  Right Ear: External ear normal.  Left Ear: External ear normal.  Nose: Nose normal.  Mouth/Throat: Oropharynx is clear and moist.  Eyes: Conjunctivae are normal.  Neck: Normal range of motion.  Cardiovascular: Normal rate, regular rhythm and normal heart sounds.  Pulmonary/Chest: Effort normal and breath sounds normal. No stridor. No respiratory distress. He has no wheezes. He has no rales.  Musculoskeletal: Normal range of motion.  Lymphadenopathy:    He has no cervical adenopathy.  Neurological: He is alert.  Skin: Skin is warm and dry.  Psychiatric: He has a normal mood and affect.  Nursing note and vitals reviewed.    UC Treatments / Results  Labs (all labs ordered are listed, but only abnormal results are displayed) Labs Reviewed - No  data to display  EKG None  Radiology No results found.  Procedures Procedures (including critical care time)  Medications Ordered in UC Medications - No data to display  Initial Impression / Assessment and Plan /  UC Course  I have reviewed the triage vital signs and the nursing notes.  Pertinent labs & imaging results that were available during my care of the patient were reviewed by me and considered in my medical decision making (see chart for details).     Viral URI- lungs clear on exam.  VSS, non toxic or ill appearing.  Continue the coricidin We will add tessalon for cough.  Follow-up with primary care provider if not better by Monday Final Clinical Impressions(s) / UC Diagnoses   Final diagnoses:  Viral URI with cough     Discharge Instructions     Continue taking the Coricidin We will add on Tessalon Perles to help with the cough If you are not better over the next couple days please follow-up with your doctor on Monday    ED Prescriptions    Medication Sig Dispense Auth. Provider   benzonatate (TESSALON) 100 MG capsule Take 1 capsule (100 mg total) by mouth every 8 (eight) hours. 21 capsule Loura Halt A, NP     Controlled Substance Prescriptions [place] Controlled Substance Registry consulted? Not Applicable   Orvan July, NP 03/27/18 [phone]

## 2018-03-27 NOTE — ED Triage Notes (Signed)
Pt presents with non reproductive persistent cough with no relief with OTC medication.

## 2018-03-30 ENCOUNTER — Telehealth: Payer: Self-pay

## 2018-03-30 ENCOUNTER — Ambulatory Visit (INDEPENDENT_AMBULATORY_CARE_PROVIDER_SITE_OTHER)
Admission: RE | Admit: 2018-03-30 | Discharge: 2018-03-30 | Disposition: A | Discharge status: Home / Self Care | Payer: BLUE CROSS/BLUE SHIELD | Source: Ambulatory Visit | Attending: Internal Medicine | Admitting: Internal Medicine

## 2018-03-30 ENCOUNTER — Ambulatory Visit: Payer: BLUE CROSS/BLUE SHIELD | Admitting: Internal Medicine

## 2018-03-30 ENCOUNTER — Encounter: Payer: Self-pay | Admitting: Internal Medicine

## 2018-03-30 VITALS — BP 124/80 | HR 72 | Temp 98.0°F | Resp 18 | Ht 70.0 in | Wt 247.0 lb

## 2018-03-30 DIAGNOSIS — R05 Cough: Secondary | ICD-10-CM

## 2018-03-30 DIAGNOSIS — R059 Cough, unspecified: Secondary | ICD-10-CM

## 2018-03-30 MED ORDER — HYDROCODONE-HOMATROPINE 5-1.5 MG/5ML PO SYRP: 5.0000 mL | ORAL_SOLUTION | Freq: Every evening | ORAL | 0 refills | Status: DC | PRN

## 2018-03-30 MED ORDER — AZITHROMYCIN 250 MG PO TABS: ORAL_TABLET | ORAL | 0 refills | Status: DC

## 2018-03-30 NOTE — Assessment & Plan Note (Addendum)
Mostly seems like from sinus drainage Severe fatigue though---will check CXR Has increased markings bilaterally---I thought lingular pneumonia Radiologist didn't feel there was pneumonia Will just treat with z-pak just in case Hydrocodone cough syrup

## 2018-03-30 NOTE — Telephone Encounter (Signed)
Patient's wife called on call over the weekend stating her husband went to urgent care on Friday with cough and congestion.  Meds benzonatate given but not helping.  Also he is on Metoprolol.  The cough is non productive and does have nasal congestion.  Symptoms triaged and Triage nurse recommends patient be seen within 24 hours.    Patient was seen today (03/30/18) in office by PCP for follow up of symptoms.

## 2018-03-30 NOTE — Progress Notes (Signed)
Subjective:    Patient ID: [name], male    DOB: Jun 16, 1955, 62 y.o.   MRN: [id number]  HPI Here for respiratory infection--for a week. With wife Was down in [place] and got sick Came home--and has mostly been in bed since then  Cough initially productive --but now dry Some nasal drainage--but this has slowed down No clear fever-but has felt hot Does get sweaty but no chills Some sense of SOB He is feeling weaker now  Went to urgent care in [place]--note reviewed Got tessalon---helps for an hour Also using OTC DM meds  Current Outpatient Medications on File Prior to Visit  Medication Sig Dispense Refill  . aspirin EC 81 MG tablet Take 81 mg by mouth daily.    . benzonatate (TESSALON) 100 MG capsule Take 1 capsule (100 mg total) by mouth every 8 (eight) hours. 21 capsule 0  . diclofenac (VOLTAREN) 75 MG EC tablet Take 1 tablet (75 mg total) by mouth 2 (two) times daily. 60 tablet 11  . fluticasone (FLONASE) 50 MCG/ACT nasal spray USE TWO SPRAY IN EACH NOSTRIL DAILY 16 g 11  . loratadine (CLARITIN) 10 MG tablet Take 10 mg by mouth daily.    . metFORMIN (GLUCOPHAGE-XR) 500 MG 24 hr tablet Take 1 tablet (500 mg total) by mouth daily with breakfast. 90 tablet 3  . metoprolol tartrate (LOPRESSOR) 50 MG tablet TAKE 1 TABLET BY MOUTH DAILY 90 tablet 3  . Multiple Vitamin (MULTIVITAMIN) tablet Take 1 tablet by mouth daily.    . nitroGLYCERIN (NITROSTAT) 0.4 MG SL tablet DISSOLVE 1 TABLET UNDER THE TONGUE FOR CHEST PAIN. MAY REPEAT EVERY 5MINUTES UP TO 3 DOSES. IF NO RELIEF, CALL 911** 30 tablet 1  . Omega-3 Fatty Acids (OMEGA-3 FISH OIL PO) Take 1,000 mg by mouth daily.    Marland Kitchen omeprazole (PRILOSEC) 20 MG capsule TAKE ONE CAPSULE BY MOUTH DAILY 90 capsule 3  . traZODone (DESYREL) 100 MG tablet Take 1-2 tablets (100-200 mg total) by mouth at bedtime as needed for sleep. 180 tablet 1   No current facility-administered medications on file prior to visit.     Allergies  Allergen  Reactions  . Atorvastatin     REACTION: muscle aches  . Simvastatin     REACTION: hand pain  . Pravachol [Pravastatin Sodium]     Muscle aching    Past Medical History:  Diagnosis Date  . CAD (coronary artery disease) 7/07   Cath for unstable angina--- 50-70% LAD, 40% circ  . ED (erectile dysfunction)   . GERD (gastroesophageal reflux disease)   . Hx of colonic polyps   . Hyperlipidemia   . Hypertension   . Impaired fasting glucose   . Sleep disorder   . Wrist fracture     6 foot fall onto head, concussion, L wrist (comminuted with intra-articular extension) and 2nd finger (middle phalanx) fxs, vertigo    Past Surgical History:  Procedure Laterality Date  . BRANCHIAL CLEFT CYST EXCISION    . COLONOSCOPY  04/03/2007  . HEMORRHOID SURGERY    . RHINOPLASTY      Family History  Problem Relation Age of Onset  . Coronary artery disease Mother   . Stroke Mother   . Coronary artery disease Brother   . Cancer Brother        colon or rectal  . Colon cancer Brother 68       half-brother   . Diabetes Neg Hx   . Stomach cancer Neg Hx  Social History   Socioeconomic History  . Marital status: Married    Spouse name: Not on file  . Number of children: 4  . Years of education: Not on file  . Highest education level: Not on file  Occupational History  . Occupation: installs signs    Employer: [employer]  . Financial resource strain: Not on file  . Food insecurity:    Worry: Not on file    Inability: Not on file  . Transportation needs:    Medical: Not on file    Non-medical: Not on file  Tobacco Use  . Smoking status: Former Smoker    Years: 35.00    Types: Cigarettes    Last attempt to quit: 10/27/2005    Years since quitting: 12.4  . Smokeless tobacco: Never Used  Substance and Sexual Activity  . Alcohol use: Yes    Comment: once a month-beer   . Drug use: No  . Sexual activity: Not on file  Lifestyle  . Physical activity:    Days  per week: Not on file    Minutes per session: Not on file  . Stress: Not on file  Relationships  . Social connections:    Talks on phone: Not on file    Gets together: Not on file    Attends religious service: Not on file    Active member of club or organization: Not on file    Attends meetings of clubs or organizations: Not on file    Relationship status: Not on file  . Intimate partner violence:    Fear of current or ex partner: Not on file    Emotionally abused: Not on file    Physically abused: Not on file    Forced sexual activity: Not on file  Other Topics Concern  . Not on file  Social History Narrative  . Not on file   Review of Systems No rash No vomiting or diarrhea Eating okay still    Objective:   Physical Exam  Constitutional: No distress.  Coarse frequent cough  HENT:  Mouth/Throat: Oropharynx is clear and moist. No oropharyngeal exudate.  Mild maxillary tenderness TMs normal Mild nasal congestion  Neck: No thyromegaly present.  Respiratory: Effort normal. No respiratory distress. He has no wheezes. He has no rales.  Slight rhonchi No dullness  Lymphadenopathy:    He has no cervical adenopathy.           Assessment & Plan:

## 2018-04-01 ENCOUNTER — Telehealth: Payer: Self-pay

## 2018-04-01 NOTE — Telephone Encounter (Addendum)
Pt's wife (DPR signed) said pt was seen on 03/30/18 and pt did not feel like going back to work today. Requesting another work note to be out of work until 04/06/18. Pt has to work outside and may have to go out of town with work; pt does not feel up to that. Pt still coughing, slightly better since taking med. Pt is still eating but appetite is decreased; Fatigue is slightly better; pt sat up a little more 03/31/18 instead of staying in bed all the time. Mrs [place] request cb when work note ready for pick up.

## 2018-04-01 NOTE — Telephone Encounter (Signed)
Spoke to pt. Note up front.

## 2018-04-01 NOTE — Telephone Encounter (Signed)
Okay to extend work note.

## 2018-04-06 ENCOUNTER — Other Ambulatory Visit: Payer: Self-pay | Admitting: Internal Medicine

## 2018-04-11 ENCOUNTER — Other Ambulatory Visit: Payer: Self-pay

## 2018-04-11 ENCOUNTER — Emergency Department (HOSPITAL_COMMUNITY)
Admission: EM | Admit: 2018-04-11 | Discharge: 2018-04-11 | Disposition: A | Discharge status: Home / Self Care | Payer: BLUE CROSS/BLUE SHIELD | Attending: Emergency Medicine | Admitting: Emergency Medicine

## 2018-04-11 ENCOUNTER — Encounter (HOSPITAL_COMMUNITY): Payer: Self-pay | Admitting: Emergency Medicine

## 2018-04-11 DIAGNOSIS — T1591XA Foreign body on external eye, part unspecified, right eye, initial encounter: Secondary | ICD-10-CM

## 2018-04-11 DIAGNOSIS — Z7982 Long term (current) use of aspirin: Secondary | ICD-10-CM | POA: Diagnosis not present

## 2018-04-11 DIAGNOSIS — Z7984 Long term (current) use of oral hypoglycemic drugs: Secondary | ICD-10-CM | POA: Diagnosis not present

## 2018-04-11 DIAGNOSIS — Z1810 Retained metal fragments, unspecified: Secondary | ICD-10-CM | POA: Diagnosis not present

## 2018-04-11 DIAGNOSIS — Z79899 Other long term (current) drug therapy: Secondary | ICD-10-CM | POA: Insufficient documentation

## 2018-04-11 DIAGNOSIS — S0501XA Injury of conjunctiva and corneal abrasion without foreign body, right eye, initial encounter: Secondary | ICD-10-CM | POA: Diagnosis not present

## 2018-04-11 DIAGNOSIS — I2510 Atherosclerotic heart disease of native coronary artery without angina pectoris: Secondary | ICD-10-CM | POA: Insufficient documentation

## 2018-04-11 DIAGNOSIS — Z87891 Personal history of nicotine dependence: Secondary | ICD-10-CM | POA: Diagnosis not present

## 2018-04-11 DIAGNOSIS — Y9289 Other specified places as the place of occurrence of the external cause: Secondary | ICD-10-CM | POA: Diagnosis not present

## 2018-04-11 DIAGNOSIS — S0591XA Unspecified injury of right eye and orbit, initial encounter: Secondary | ICD-10-CM | POA: Diagnosis present

## 2018-04-11 DIAGNOSIS — X58XXXA Exposure to other specified factors, initial encounter: Secondary | ICD-10-CM | POA: Diagnosis not present

## 2018-04-11 DIAGNOSIS — Y999 Unspecified external cause status: Secondary | ICD-10-CM | POA: Diagnosis not present

## 2018-04-11 DIAGNOSIS — I10 Essential (primary) hypertension: Secondary | ICD-10-CM | POA: Insufficient documentation

## 2018-04-11 DIAGNOSIS — Y9389 Activity, other specified: Secondary | ICD-10-CM | POA: Diagnosis not present

## 2018-04-11 MED ORDER — TOBRAMYCIN 0.3 % OP SOLN
2.0000 [drp] | Freq: Once | OPHTHALMIC | Status: AC
  Administered 2018-04-11: 2 [drp] via OPHTHALMIC
  Filled 2018-04-11: qty 5

## 2018-04-11 MED ORDER — TETRACAINE HCL 0.5 % OP SOLN
2.0000 [drp] | Freq: Once | OPHTHALMIC | Status: AC
  Administered 2018-04-11: 2 [drp] via OPHTHALMIC
  Filled 2018-04-11: qty 4

## 2018-04-11 MED ORDER — FLUORESCEIN SODIUM 1 MG OP STRP
1.0000 | ORAL_STRIP | Freq: Once | OPHTHALMIC | Status: AC
  Administered 2018-04-11: 1 via OPHTHALMIC
  Filled 2018-04-11: qty 1

## 2018-04-11 NOTE — ED Triage Notes (Signed)
Patient states that he was cutting metal and thinks he got a sliver of metal in his right eye.

## 2018-04-11 NOTE — Discharge Instructions (Addendum)
To use your eye drops please place 2 drops in your right eye 4 times a day for 5 days.    Please do not wear your contacts until you are cleared to do so by your eye doctor.  Please call your eye doctor for an appointment in the next 1 to 2 days.  If you have any concerns changes or worsening in symptoms then please seek additional medical care and evaluation.    As we discussed you may still have metal in your eye.  I offered you an x-ray of your eyes to evaluate for metal in your eyes which you declined.

## 2018-04-11 NOTE — ED Provider Notes (Signed)
[name] Provider Note   CSN: [id number] Arrival date & time: 04/11/18  2212     History   Chief Complaint Chief Complaint  Patient presents with  . Foreign Body in [place] is a 62 y.o. male who presents today for concern of foreign body in his right eye.  [name] reports that at work [name] works with metal and was wearing his safety glasses today.  After [name] left work [name] had metal shavings fall out of his hair and immediate onset of foreign body sensation in his right eye.  [name] denies any visual changes.  [name] denies any other complaints or concerns today.  States his last tetanus was in the past 5 to 10 years.  HPI  Past Medical History:  Diagnosis Date  . CAD (coronary artery disease) 7/07   Cath for unstable angina--- 50-70% LAD, 40% circ  . ED (erectile dysfunction)   . GERD (gastroesophageal reflux disease)   . Hx of colonic polyps   . Hyperlipidemia   . Hypertension   . Impaired fasting glucose   . Sleep disorder   . Wrist fracture     6 foot fall onto head, concussion, L wrist (comminuted with intra-articular extension) and 2nd finger (middle phalanx) fxs, vertigo    Patient Active Problem List   Diagnosis Date Noted  . Cough 03/30/2018  . Actinic keratoses 09/13/2016  . Bilateral hand pain 05/30/2016  . Neuropathy ([place]) 01/26/2016  . RBBB 09/02/2014  . CAD (coronary artery disease)   . Routine general medical examination at a health care facility 11/29/2011  . Prediabetes 12/02/2008  . Essential hypertension, benign 05/16/2007  . DIVERTICULOSIS, COLON 05/16/2007  . COLONIC POLYPS, HYPERPLASTIC 04/03/2007  . Hyperlipemia 11/25/2006  . GERD 11/25/2006  . 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 Sleep disturbance 11/25/2006    Past Surgical History:  Procedure Laterality Date  . BRANCHIAL CLEFT CYST EXCISION    . COLONOSCOPY  04/03/2007  . HEMORRHOID SURGERY    . RHINOPLASTY          Home  Medications    Prior to Admission medications   Medication Sig Start Date End Date Taking? Authorizing Provider  aspirin EC 81 MG tablet Take 81 mg by mouth daily.    [provider]  azithromycin (ZITHROMAX Z-PAK) 250 MG tablet Take 2 tablets (500 mg) on  Day 1,  followed by 1 tablet (250 mg) once daily on Days 2 through 5. 03/30/18   Venia Carbon, MD  benzonatate (TESSALON) 100 MG capsule Take 1 capsule (100 mg total) by mouth every 8 (eight) hours. 03/27/18   Loura Halt A, NP  diclofenac (VOLTAREN) 75 MG EC tablet Take 1 tablet (75 mg total) by mouth 2 (two) times daily. 08/18/17   Venia Carbon, MD  fluticasone (FLONASE) 50 MCG/ACT nasal spray USE TWO SPRAY IN EACH NOSTRIL DAILY 02/13/18   Venia Carbon, MD  HYDROcodone-homatropine [facility]) 5-1.5 MG/5ML syrup Take 5 mLs by mouth at bedtime as needed for cough. 03/30/18   Venia Carbon, MD  loratadine (CLARITIN) 10 MG tablet Take 10 mg by mouth daily.    [provider]  metFORMIN (GLUCOPHAGE-XR) 500 MG 24 hr tablet Take 1 tablet (500 mg total) by mouth daily with breakfast. 02/13/18   Venia Carbon, MD  metoprolol tartrate (LOPRESSOR) 50 MG tablet TAKE 1 TABLET BY MOUTH DAILY 04/06/18   Venia Carbon, MD  Multiple Vitamin (  MULTIVITAMIN) tablet Take 1 tablet by mouth daily.    [provider]  nitroGLYCERIN (NITROSTAT) 0.4 MG SL tablet DISSOLVE 1 TABLET UNDER THE TONGUE FOR CHEST PAIN. MAY REPEAT EVERY 5MINUTES UP TO 3 DOSES. IF NO RELIEF, CALL 911** 01/21/17   Venia Carbon, MD  Omega-3 Fatty Acids (OMEGA-3 FISH OIL PO) Take 1,000 mg by mouth daily.    [provider]  omeprazole (PRILOSEC) 20 MG capsule TAKE ONE CAPSULE BY MOUTH DAILY 02/19/18   Venia Carbon, MD  traZODone (DESYREL) 100 MG tablet Take 1-2 tablets (100-200 mg total) by mouth at bedtime as needed for sleep. 09/06/15   Venia Carbon, MD    Family History Family History  Problem Relation Age of Onset  .  Coronary artery disease Mother   . Stroke Mother   . Coronary artery disease Brother   . Cancer Brother        colon or rectal  . Colon cancer Brother 7       half-brother   . Diabetes Neg Hx   . Stomach cancer Neg Hx     Social History Social History   Tobacco Use  . Smoking status: Former Smoker    Years: 35.00    Types: Cigarettes    Last attempt to quit: 10/27/2005    Years since quitting: 12.4  . Smokeless tobacco: Never Used  Substance Use Topics  . Alcohol use: Yes    Comment: once a month-beer   . Drug use: No     Allergies   Atorvastatin; Simvastatin; and Pravachol [pravastatin sodium]   Review of Systems Review of Systems  Constitutional: Negative for chills and fever.  Eyes: Positive for pain and redness. Negative for photophobia, discharge, itching and visual disturbance.  Respiratory: Negative for chest tightness and shortness of breath.   Neurological: Negative for headaches.  All other systems reviewed and are negative.    Physical Exam Updated Vital Signs BP 133/75   Pulse 76   Temp 97.8 F (36.6 C) (Oral)   Resp 14   SpO2 96%   Physical Exam Vitals signs and nursing note reviewed.  Constitutional:      Appearance: Normal appearance.  HENT:     Head: Normocephalic and atraumatic.     Nose: Nose normal.     Mouth/Throat:     Mouth: Mucous membranes are moist.  Eyes:     General: Lids are normal. Vision grossly intact. Gaze aligned appropriately. No scleral icterus.    Extraocular Movements: Extraocular movements intact.     Conjunctiva/sclera:     Right eye: Right conjunctiva is injected.     Left eye: Left conjunctiva is not injected.     Pupils: Pupils are equal, round, and reactive to light.     Right eye: Corneal abrasion and fluorescein uptake present. Seidel exam negative.     Comments: Right upper eyelid was inverted, a approximately 3 x 3 mm shiny metallic object was visualized superficially.  This was removed with a sterile  cotton swab moistened with sterile water.  After this both upper and lower lids were everted and examined without evidence of residual foreign bodies.  Small corneal abrasion at approximately 9:00 in the right eye.  Neck:     Musculoskeletal: Normal range of motion and neck supple.  Skin:    General: Skin is warm and dry.  Neurological:     General: No focal deficit present.     Mental Status: [name] is alert.  ED Treatments / Results  Labs (all labs ordered are listed, but only abnormal results are displayed) Labs Reviewed - No data to display  EKG None  Radiology No results found.  Procedures Procedures (including critical care time)  Medications Ordered in ED Medications  fluorescein ophthalmic strip 1 strip (1 strip Both Eyes Given 04/11/18 2243)  tetracaine (PONTOCAINE) 0.5 % ophthalmic solution 2 drop (2 drops Both Eyes Given 04/11/18 2242)  tobramycin (TOBREX) 0.3 % ophthalmic solution 2 drop (2 drops Right Eye Given 04/11/18 2323)     Initial Impression / Assessment and Plan / ED Course  I have reviewed the triage vital signs and the nursing notes.  Pertinent labs & imaging results that were available during my care of the patient were reviewed by me and considered in my medical decision making (see chart for details).    Patient presents today for concern of right eye foreign body.  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  Small metallic object was removed from the surface of the right eyelid internally.  After this fluorescein stain was performed, showing a small corneal abrasion at approximately 9:00 on the right eye.  Negative Seidel sign.  Given mechanism of the piece falling from his hair into his eyes rather than being projected at high speeds not consistent with rupture. Vision is grossly intact and at patient's baseline.  Discussed possibility of additional metallic objects, discussed  risks and benefits of obtaining x-ray to evaluate for additional foreign bodies which patient declined.  [name] is given tobramycin eyedrops while in the emergency room with instructions on their appropriate use at home.  Instructed to follow-up with ophthalmology in the next 24 to 48 hours.  Patient's tetanus is up-to-date.  Return precautions were discussed with patient who states their understanding.  At the time of discharge patient denied any unaddressed complaints or concerns.  Patient is agreeable for discharge home.  This patient was seen as a shared visit with Dr. Ashok Cordia.    Final Clinical Impressions(s) / ED Diagnoses   Final diagnoses:  Foreign body of right eye, initial encounter  Abrasion of right cornea, initial encounter    ED Discharge Orders    None       Ollen Gross 04/11/18 2331    Lajean Saver, MD 04/11/18 2336

## 2018-04-12 ENCOUNTER — Encounter (HOSPITAL_COMMUNITY)
Admission: EM | Disposition: A | Discharge status: Home / Self Care | Payer: Self-pay | Source: Home / Self Care | Attending: Thoracic Surgery (Cardiothoracic Vascular Surgery)

## 2018-04-12 ENCOUNTER — Observation Stay (HOSPITAL_COMMUNITY): Payer: BLUE CROSS/BLUE SHIELD | Admitting: Anesthesiology

## 2018-04-12 ENCOUNTER — Encounter (HOSPITAL_COMMUNITY): Payer: Self-pay | Admitting: Certified Registered"

## 2018-04-12 ENCOUNTER — Observation Stay (HOSPITAL_COMMUNITY): Payer: BLUE CROSS/BLUE SHIELD

## 2018-04-12 ENCOUNTER — Inpatient Hospital Stay (HOSPITAL_COMMUNITY)
Admission: EM | Admit: 2018-04-12 | Discharge: 2018-04-16 | DRG: 234 | Disposition: A | Discharge status: Home / Self Care | Payer: BLUE CROSS/BLUE SHIELD | Attending: Thoracic Surgery (Cardiothoracic Vascular Surgery) | Admitting: Thoracic Surgery (Cardiothoracic Vascular Surgery)

## 2018-04-12 ENCOUNTER — Other Ambulatory Visit: Payer: Self-pay

## 2018-04-12 ENCOUNTER — Inpatient Hospital Stay (HOSPITAL_COMMUNITY)
Admission: EM | Disposition: A | Discharge status: Home / Self Care | Payer: Self-pay | Source: Home / Self Care | Attending: Thoracic Surgery (Cardiothoracic Vascular Surgery)

## 2018-04-12 ENCOUNTER — Emergency Department (HOSPITAL_COMMUNITY): Payer: BLUE CROSS/BLUE SHIELD

## 2018-04-12 ENCOUNTER — Inpatient Hospital Stay (HOSPITAL_COMMUNITY): Payer: BLUE CROSS/BLUE SHIELD

## 2018-04-12 ENCOUNTER — Observation Stay (HOSPITAL_BASED_OUTPATIENT_CLINIC_OR_DEPARTMENT_OTHER): Payer: BLUE CROSS/BLUE SHIELD

## 2018-04-12 DIAGNOSIS — Z79899 Other long term (current) drug therapy: Secondary | ICD-10-CM | POA: Diagnosis not present

## 2018-04-12 DIAGNOSIS — J9811 Atelectasis: Secondary | ICD-10-CM | POA: Diagnosis not present

## 2018-04-12 DIAGNOSIS — D62 Acute posthemorrhagic anemia: Secondary | ICD-10-CM | POA: Diagnosis not present

## 2018-04-12 DIAGNOSIS — Z800 Family history of malignant neoplasm of digestive organs: Secondary | ICD-10-CM

## 2018-04-12 DIAGNOSIS — I213 ST elevation (STEMI) myocardial infarction of unspecified site: Secondary | ICD-10-CM | POA: Diagnosis not present

## 2018-04-12 DIAGNOSIS — R079 Chest pain, unspecified: Secondary | ICD-10-CM | POA: Diagnosis not present

## 2018-04-12 DIAGNOSIS — I2510 Atherosclerotic heart disease of native coronary artery without angina pectoris: Secondary | ICD-10-CM | POA: Diagnosis present

## 2018-04-12 DIAGNOSIS — Z7982 Long term (current) use of aspirin: Secondary | ICD-10-CM | POA: Diagnosis not present

## 2018-04-12 DIAGNOSIS — I25110 Atherosclerotic heart disease of native coronary artery with unstable angina pectoris: Secondary | ICD-10-CM | POA: Diagnosis not present

## 2018-04-12 DIAGNOSIS — R0789 Other chest pain: Secondary | ICD-10-CM | POA: Diagnosis not present

## 2018-04-12 DIAGNOSIS — Z823 Family history of stroke: Secondary | ICD-10-CM

## 2018-04-12 DIAGNOSIS — J90 Pleural effusion, not elsewhere classified: Secondary | ICD-10-CM | POA: Diagnosis not present

## 2018-04-12 DIAGNOSIS — I444 Left anterior fascicular block: Secondary | ICD-10-CM | POA: Diagnosis present

## 2018-04-12 DIAGNOSIS — I471 Supraventricular tachycardia: Secondary | ICD-10-CM | POA: Diagnosis not present

## 2018-04-12 DIAGNOSIS — Z86010 Personal history of colonic polyps: Secondary | ICD-10-CM

## 2018-04-12 DIAGNOSIS — Z791 Long term (current) use of non-steroidal anti-inflammatories (NSAID): Secondary | ICD-10-CM

## 2018-04-12 DIAGNOSIS — Z888 Allergy status to other drugs, medicaments and biological substances status: Secondary | ICD-10-CM | POA: Diagnosis not present

## 2018-04-12 DIAGNOSIS — I371 Nonrheumatic pulmonary valve insufficiency: Secondary | ICD-10-CM | POA: Diagnosis not present

## 2018-04-12 DIAGNOSIS — I493 Ventricular premature depolarization: Secondary | ICD-10-CM | POA: Diagnosis present

## 2018-04-12 DIAGNOSIS — K219 Gastro-esophageal reflux disease without esophagitis: Secondary | ICD-10-CM | POA: Diagnosis present

## 2018-04-12 DIAGNOSIS — Z7951 Long term (current) use of inhaled steroids: Secondary | ICD-10-CM | POA: Diagnosis not present

## 2018-04-12 DIAGNOSIS — E782 Mixed hyperlipidemia: Secondary | ICD-10-CM | POA: Diagnosis not present

## 2018-04-12 DIAGNOSIS — Z7984 Long term (current) use of oral hypoglycemic drugs: Secondary | ICD-10-CM

## 2018-04-12 DIAGNOSIS — Z951 Presence of aortocoronary bypass graft: Secondary | ICD-10-CM | POA: Diagnosis not present

## 2018-04-12 DIAGNOSIS — Z87891 Personal history of nicotine dependence: Secondary | ICD-10-CM

## 2018-04-12 DIAGNOSIS — E8779 Other fluid overload: Secondary | ICD-10-CM | POA: Diagnosis not present

## 2018-04-12 DIAGNOSIS — R918 Other nonspecific abnormal finding of lung field: Secondary | ICD-10-CM | POA: Diagnosis not present

## 2018-04-12 DIAGNOSIS — I10 Essential (primary) hypertension: Secondary | ICD-10-CM | POA: Diagnosis present

## 2018-04-12 DIAGNOSIS — Z4682 Encounter for fitting and adjustment of non-vascular catheter: Secondary | ICD-10-CM | POA: Diagnosis not present

## 2018-04-12 DIAGNOSIS — Z8249 Family history of ischemic heart disease and other diseases of the circulatory system: Secondary | ICD-10-CM | POA: Diagnosis not present

## 2018-04-12 DIAGNOSIS — E1140 Type 2 diabetes mellitus with diabetic neuropathy, unspecified: Secondary | ICD-10-CM | POA: Diagnosis not present

## 2018-04-12 DIAGNOSIS — J189 Pneumonia, unspecified organism: Secondary | ICD-10-CM | POA: Diagnosis not present

## 2018-04-12 DIAGNOSIS — I214 Non-ST elevation (NSTEMI) myocardial infarction: Secondary | ICD-10-CM

## 2018-04-12 DIAGNOSIS — I2102 ST elevation (STEMI) myocardial infarction involving left anterior descending coronary artery: Principal | ICD-10-CM | POA: Diagnosis present

## 2018-04-12 DIAGNOSIS — E785 Hyperlipidemia, unspecified: Secondary | ICD-10-CM | POA: Diagnosis present

## 2018-04-12 DIAGNOSIS — I081 Rheumatic disorders of both mitral and tricuspid valves: Secondary | ICD-10-CM | POA: Diagnosis not present

## 2018-04-12 HISTORY — PX: LEFT HEART CATH AND CORONARY ANGIOGRAPHY: CATH118249

## 2018-04-12 HISTORY — DX: Presence of aortocoronary bypass graft: Z95.1

## 2018-04-12 HISTORY — PX: TEE WITHOUT CARDIOVERSION: SHX5443

## 2018-04-12 HISTORY — PX: CORONARY ARTERY BYPASS GRAFT: SHX141

## 2018-04-12 HISTORY — DX: ST elevation (STEMI) myocardial infarction involving left anterior descending coronary artery: I21.02

## 2018-04-12 HISTORY — PX: CORONARY/GRAFT ACUTE MI REVASCULARIZATION: CATH118305

## 2018-04-12 LAB — CBC
HCT: 44.9 % (ref 39.0–52.0)
HCT: 40.2 % (ref 39.0–52.0)
HCT: 38.2 % — ABNORMAL LOW (ref 39.0–52.0)
Hemoglobin: 14.1 g/dL (ref 13.0–17.0)
Hemoglobin: 12.6 g/dL — ABNORMAL LOW (ref 13.0–17.0)
Hemoglobin: 11.8 g/dL — ABNORMAL LOW (ref 13.0–17.0)
MCH: 28.2 pg (ref 26.0–34.0)
MCH: 28.4 pg (ref 26.0–34.0)
MCH: 28.5 pg (ref 26.0–34.0)
MCHC: 31.4 g/dL (ref 30.0–36.0)
MCHC: 31.3 g/dL (ref 30.0–36.0)
MCHC: 30.9 g/dL (ref 30.0–36.0)
MCV: 89.8 fL (ref 80.0–100.0)
MCV: 90.7 fL (ref 80.0–100.0)
MCV: 92.3 fL (ref 80.0–100.0)
Platelets: 358 10*3/uL (ref 150–400)
Platelets: 337 10*3/uL (ref 150–400)
Platelets: 236 10*3/uL (ref 150–400)
RBC: 5 MIL/uL (ref 4.22–5.81)
RBC: 4.43 MIL/uL (ref 4.22–5.81)
RBC: 4.14 MIL/uL — ABNORMAL LOW (ref 4.22–5.81)
RDW: 13 % (ref 11.5–15.5)
RDW: 13 % (ref 11.5–15.5)
RDW: 12.9 % (ref 11.5–15.5)
WBC: 7.6 10*3/uL (ref 4.0–10.5)
WBC: 10.7 10*3/uL — ABNORMAL HIGH (ref 4.0–10.5)
WBC: 15.4 10*3/uL — ABNORMAL HIGH (ref 4.0–10.5)
nRBC: 0 % (ref 0.0–0.2)
nRBC: 0 % (ref 0.0–0.2)
nRBC: 0 % (ref 0.0–0.2)

## 2018-04-12 LAB — BASIC METABOLIC PANEL
Anion gap: 11 (ref 5–15)
Anion gap: 14 (ref 5–15)
BUN: 13 mg/dL (ref 8–23)
BUN: 12 mg/dL (ref 8–23)
CO2: 24 mmol/L (ref 22–32)
CO2: 22 mmol/L (ref 22–32)
Calcium: 9 mg/dL (ref 8.9–10.3)
Calcium: 9 mg/dL (ref 8.9–10.3)
Chloride: 105 mmol/L (ref 98–111)
Chloride: 104 mmol/L (ref 98–111)
Creatinine, Ser: 1.04 mg/dL (ref 0.61–1.24)
Creatinine, Ser: 0.92 mg/dL (ref 0.61–1.24)
GFR calc Af Amer: >60 mL/min (ref >60)
GFR calc Af Amer: >60 mL/min (ref >60)
GFR calc non Af Amer: >60 mL/min (ref >60)
GFR calc non Af Amer: >60 mL/min (ref >60)
Glucose, Bld: 144 mg/dL — ABNORMAL HIGH (ref 70–99)
Glucose, Bld: 116 mg/dL — ABNORMAL HIGH (ref 70–99)
Potassium: 3.7 mmol/L (ref 3.5–5.1)
Potassium: 3.9 mmol/L (ref 3.5–5.1)
Sodium: 140 mmol/L (ref 135–145)
Sodium: 140 mmol/L (ref 135–145)

## 2018-04-12 LAB — PROTIME-INR
INR: 1.14
INR: 1.21
Prothrombin Time: 14.5 seconds (ref 11.4–15.2)
Prothrombin Time: 15.1 seconds (ref 11.4–15.2)

## 2018-04-12 LAB — POCT I-STAT, CHEM 8
BUN: 13 mg/dL (ref 8–23)
BUN: 11 mg/dL (ref 8–23)
BUN: 12 mg/dL (ref 8–23)
BUN: 11 mg/dL (ref 8–23)
Calcium, Ion: 1.22 mmol/L (ref 1.15–1.40)
Calcium, Ion: 1.07 mmol/L — ABNORMAL LOW (ref 1.15–1.40)
Calcium, Ion: 1.22 mmol/L (ref 1.15–1.40)
Calcium, Ion: 1.15 mmol/L (ref 1.15–1.40)
Chloride: 106 mmol/L (ref 98–111)
Chloride: 106 mmol/L (ref 98–111)
Chloride: 107 mmol/L (ref 98–111)
Chloride: 106 mmol/L (ref 98–111)
Creatinine, Ser: 0.7 mg/dL (ref 0.61–1.24)
Creatinine, Ser: 0.7 mg/dL (ref 0.61–1.24)
Creatinine, Ser: 0.7 mg/dL (ref 0.61–1.24)
Creatinine, Ser: 0.8 mg/dL (ref 0.61–1.24)
Glucose, Bld: 120 mg/dL — ABNORMAL HIGH (ref 70–99)
Glucose, Bld: 112 mg/dL — ABNORMAL HIGH (ref 70–99)
Glucose, Bld: 118 mg/dL — ABNORMAL HIGH (ref 70–99)
Glucose, Bld: 129 mg/dL — ABNORMAL HIGH (ref 70–99)
HCT: 37 % — ABNORMAL LOW (ref 39.0–52.0)
HCT: 31 % — ABNORMAL LOW (ref 39.0–52.0)
HCT: 36 % — ABNORMAL LOW (ref 39.0–52.0)
HCT: 31 % — ABNORMAL LOW (ref 39.0–52.0)
Hemoglobin: 12.6 g/dL — ABNORMAL LOW (ref 13.0–17.0)
Hemoglobin: 10.5 g/dL — ABNORMAL LOW (ref 13.0–17.0)
Hemoglobin: 12.2 g/dL — ABNORMAL LOW (ref 13.0–17.0)
Hemoglobin: 10.5 g/dL — ABNORMAL LOW (ref 13.0–17.0)
Potassium: 3.6 mmol/L (ref 3.5–5.1)
Potassium: 3.9 mmol/L (ref 3.5–5.1)
Potassium: 5.2 mmol/L — ABNORMAL HIGH (ref 3.5–5.1)
Potassium: 4.3 mmol/L (ref 3.5–5.1)
Sodium: 140 mmol/L (ref 135–145)
Sodium: 139 mmol/L (ref 135–145)
Sodium: 140 mmol/L (ref 135–145)
Sodium: 139 mmol/L (ref 135–145)
TCO2: 26 mmol/L (ref 22–32)
TCO2: 26 mmol/L (ref 22–32)
TCO2: 28 mmol/L (ref 22–32)
TCO2: 26 mmol/L (ref 22–32)

## 2018-04-12 LAB — I-STAT TROPONIN, ED: Troponin i, poc: 0.14 ng/mL (ref 0.00–0.08)

## 2018-04-12 LAB — COMPREHENSIVE METABOLIC PANEL
ALT: 32 U/L (ref 0–44)
AST: 34 U/L (ref 15–41)
Albumin: 3 g/dL — ABNORMAL LOW (ref 3.5–5.0)
Alkaline Phosphatase: 66 U/L (ref 38–126)
Anion gap: 10 (ref 5–15)
BUN: 12 mg/dL (ref 8–23)
CO2: 22 mmol/L (ref 22–32)
Calcium: 7.6 mg/dL — ABNORMAL LOW (ref 8.9–10.3)
Chloride: 99 mmol/L (ref 98–111)
Creatinine, Ser: 0.85 mg/dL (ref 0.61–1.24)
GFR calc Af Amer: >60 mL/min (ref >60)
GFR calc non Af Amer: >60 mL/min (ref >60)
Glucose, Bld: 113 mg/dL — ABNORMAL HIGH (ref 70–99)
Potassium: 3.2 mmol/L — ABNORMAL LOW (ref 3.5–5.1)
Sodium: 131 mmol/L — ABNORMAL LOW (ref 135–145)
Total Bilirubin: 1 mg/dL (ref 0.3–1.2)
Total Protein: 5.9 g/dL — ABNORMAL LOW (ref 6.5–8.1)

## 2018-04-12 LAB — TYPE AND SCREEN
ABO/RH(D): O NEG
Antibody Screen: NEGATIVE

## 2018-04-12 LAB — POCT I-STAT 3, ART BLOOD GAS (G3+)
Acid-base deficit: 1 mmol/L (ref 0.0–2.0)
Acid-base deficit: 2 mmol/L (ref 0.0–2.0)
Bicarbonate: 23.6 mmol/L (ref 20.0–28.0)
Bicarbonate: 25.1 mmol/L (ref 20.0–28.0)
Bicarbonate: 25.4 mmol/L (ref 20.0–28.0)
O2 Saturation: 100 %
O2 Saturation: 100 %
O2 Saturation: 95 %
Patient temperature: 36
TCO2: 25 mmol/L (ref 22–32)
TCO2: 26 mmol/L (ref 22–32)
TCO2: 27 mmol/L (ref 22–32)
pCO2 arterial: 41.3 mmHg (ref 32.0–48.0)
pCO2 arterial: 43.7 mmHg (ref 32.0–48.0)
pCO2 arterial: 45.7 mmHg (ref 32.0–48.0)
pH, Arterial: 7.348 — ABNORMAL LOW (ref 7.350–7.450)
pH, Arterial: 7.36 (ref 7.350–7.450)
pH, Arterial: 7.373 (ref 7.350–7.450)
pO2, Arterial: 185 mmHg — ABNORMAL HIGH (ref 83.0–108.0)
pO2, Arterial: 344 mmHg — ABNORMAL HIGH (ref 83.0–108.0)
pO2, Arterial: 73 mmHg — ABNORMAL LOW (ref 83.0–108.0)

## 2018-04-12 LAB — POCT I-STAT 4, (NA,K, GLUC, HGB,HCT)
Glucose, Bld: 132 mg/dL — ABNORMAL HIGH (ref 70–99)
HCT: 34 % — ABNORMAL LOW (ref 39.0–52.0)
Hemoglobin: 11.6 g/dL — ABNORMAL LOW (ref 13.0–17.0)
Potassium: 4.4 mmol/L (ref 3.5–5.1)
Sodium: 141 mmol/L (ref 135–145)

## 2018-04-12 LAB — HEMOGLOBIN A1C
Hgb A1c MFr Bld: 6.2 % — ABNORMAL HIGH (ref 4.8–5.6)
Hgb A1c MFr Bld: 6.2 % — ABNORMAL HIGH (ref 4.8–5.6)
Mean Plasma Glucose: 131.24 mg/dL
Mean Plasma Glucose: 131.24 mg/dL

## 2018-04-12 LAB — ABO/RH: ABO/RH(D): O NEG

## 2018-04-12 LAB — TROPONIN I
Troponin I: 0.21 ng/mL (ref <0.03)
Troponin I: 0.77 ng/mL (ref <0.03)
Troponin I: 1.68 ng/mL (ref <0.03)
Troponin I: 1.07 ng/mL (ref <0.03)

## 2018-04-12 LAB — APTT
aPTT: >200 seconds (ref 24–36)
aPTT: 34 seconds (ref 24–36)

## 2018-04-12 LAB — LIPID PANEL
Cholesterol: 169 mg/dL (ref 0–200)
HDL: 40 mg/dL — ABNORMAL LOW (ref >40)
LDL Cholesterol: 115 mg/dL — ABNORMAL HIGH (ref 0–99)
Total CHOL/HDL Ratio: 4.2 RATIO
Triglycerides: 71 mg/dL (ref <150)
VLDL: 14 mg/dL (ref 0–40)

## 2018-04-12 LAB — ECHOCARDIOGRAM COMPLETE
Height: 70 in
Weight: 4000 oz

## 2018-04-12 LAB — PLATELET COUNT: Platelets: 293 10*3/uL (ref 150–400)

## 2018-04-12 LAB — GLUCOSE, CAPILLARY: Glucose-Capillary: 109 mg/dL — ABNORMAL HIGH (ref 70–99)

## 2018-04-12 LAB — HEMOGLOBIN AND HEMATOCRIT, BLOOD
HCT: 31.4 % — ABNORMAL LOW (ref 39.0–52.0)
Hemoglobin: 10.1 g/dL — ABNORMAL LOW (ref 13.0–17.0)

## 2018-04-12 LAB — HEPARIN LEVEL (UNFRACTIONATED): Heparin Unfractionated: 0.36 IU/mL (ref 0.30–0.70)

## 2018-04-12 SURGERY — CORONARY ARTERY BYPASS GRAFTING (CABG)
Anesthesia: General | Site: Chest

## 2018-04-12 SURGERY — CORONARY/GRAFT ACUTE MI REVASCULARIZATION
Anesthesia: LOCAL

## 2018-04-12 MED ORDER — MORPHINE SULFATE (PF) 2 MG/ML IV SOLN
2.0000 mg | Freq: Once | INTRAVENOUS | Status: AC
  Administered 2018-04-12: 2 mg via INTRAVENOUS

## 2018-04-12 MED ORDER — HEPARIN BOLUS VIA INFUSION
4000.0000 [IU] | Freq: Once | INTRAVENOUS | Status: AC
  Administered 2018-04-12: 4000 [IU] via INTRAVENOUS
  Filled 2018-04-12: qty 4000

## 2018-04-12 MED ORDER — TRANEXAMIC ACID (OHS) BOLUS VIA INFUSION
15.0000 mg/kg | INTRAVENOUS | Status: AC
  Administered 2018-04-12: 1675.5 mg via INTRAVENOUS
  Filled 2018-04-12: qty 1676

## 2018-04-12 MED ORDER — POTASSIUM CHLORIDE 10 MEQ/50ML IV SOLN: 10.0000 meq | INTRAVENOUS | Status: AC

## 2018-04-12 MED ORDER — ACETAMINOPHEN 650 MG RE SUPP: 650.0000 mg | Freq: Once | RECTAL | Status: AC

## 2018-04-12 MED ORDER — EPINEPHRINE PF 1 MG/ML IJ SOLN
0.0000 ug/min | INTRAVENOUS | Status: DC
  Filled 2018-04-12: qty 4

## 2018-04-12 MED ORDER — DEXMEDETOMIDINE HCL IN NACL 400 MCG/100ML IV SOLN
0.1000 ug/kg/h | INTRAVENOUS | Status: AC
  Administered 2018-04-12: .2 ug/kg/h via INTRAVENOUS
  Filled 2018-04-12 (×2): qty 100

## 2018-04-12 MED ORDER — TRANEXAMIC ACID 1000 MG/10ML IV SOLN
1.5000 mg/kg/h | INTRAVENOUS | Status: AC
  Administered 2018-04-12: 1.5 mg/kg/h via INTRAVENOUS
  Filled 2018-04-12: qty 25

## 2018-04-12 MED ORDER — DOCUSATE SODIUM 100 MG PO CAPS
200.0000 mg | ORAL_CAPSULE | Freq: Every day | ORAL | Status: DC
  Administered 2018-04-13 – 2018-04-16 (×4): 200 mg via ORAL
  Filled 2018-04-12 (×4): qty 2

## 2018-04-12 MED ORDER — VANCOMYCIN HCL 10 G IV SOLR
1500.0000 mg | INTRAVENOUS | Status: AC
  Administered 2018-04-12: 1500 mg via INTRAVENOUS
  Filled 2018-04-12: qty 1500

## 2018-04-12 MED ORDER — LACTATED RINGERS IV SOLN
INTRAVENOUS | Status: DC | PRN
  Administered 2018-04-12: 19:00:00 via INTRAVENOUS

## 2018-04-12 MED ORDER — ROCURONIUM BROMIDE 10 MG/ML (PF) SYRINGE
PREFILLED_SYRINGE | INTRAVENOUS | Status: DC | PRN
  Administered 2018-04-12: 50 mg via INTRAVENOUS
  Administered 2018-04-12: 100 mg via INTRAVENOUS

## 2018-04-12 MED ORDER — ONDANSETRON HCL 4 MG/2ML IJ SOLN: 4.0000 mg | Freq: Four times a day (QID) | INTRAMUSCULAR | Status: DC | PRN

## 2018-04-12 MED ORDER — MIDAZOLAM HCL (PF) 10 MG/2ML IJ SOLN
INTRAMUSCULAR | Status: AC
  Filled 2018-04-12: qty 2

## 2018-04-12 MED ORDER — SODIUM CHLORIDE 0.9% FLUSH
3.0000 mL | INTRAVENOUS | Status: DC | PRN
  Administered 2018-04-13: 3 mL via INTRAVENOUS
  Filled 2018-04-12: qty 3

## 2018-04-12 MED ORDER — HEPARIN SODIUM (PORCINE) 1000 UNIT/ML IJ SOLN
INTRAMUSCULAR | Status: DC | PRN
  Administered 2018-04-12: 8000 [IU] via INTRAVENOUS

## 2018-04-12 MED ORDER — ASPIRIN EC 81 MG PO TBEC: 81.0000 mg | DELAYED_RELEASE_TABLET | Freq: Every day | ORAL | Status: DC

## 2018-04-12 MED ORDER — ACETAMINOPHEN 500 MG PO TABS
1000.0000 mg | ORAL_TABLET | Freq: Four times a day (QID) | ORAL | Status: DC
  Administered 2018-04-13 – 2018-04-16 (×13): 1000 mg via ORAL
  Filled 2018-04-12 (×13): qty 2

## 2018-04-12 MED ORDER — LIDOCAINE HCL (PF) 1 % IJ SOLN
INTRAMUSCULAR | Status: AC
  Filled 2018-04-12: qty 30

## 2018-04-12 MED ORDER — SODIUM CHLORIDE 0.9 % IV SOLN
1.5000 g | Freq: Two times a day (BID) | INTRAVENOUS | Status: AC
  Administered 2018-04-13 – 2018-04-14 (×4): 1.5 g via INTRAVENOUS
  Filled 2018-04-12 (×4): qty 1.5

## 2018-04-12 MED ORDER — HEPARIN (PORCINE) 25000 UT/250ML-% IV SOLN
1450.0000 [IU]/h | INTRAVENOUS | Status: DC
  Administered 2018-04-12: 1400 [IU]/h via INTRAVENOUS
  Filled 2018-04-12: qty 250

## 2018-04-12 MED ORDER — NITROGLYCERIN 0.4 MG SL SUBL: 0.4000 mg | SUBLINGUAL_TABLET | SUBLINGUAL | Status: DC | PRN

## 2018-04-12 MED ORDER — SODIUM CHLORIDE 0.9% FLUSH: 3.0000 mL | INTRAVENOUS | Status: DC | PRN

## 2018-04-12 MED ORDER — SODIUM CHLORIDE 0.9% FLUSH
3.0000 mL | Freq: Two times a day (BID) | INTRAVENOUS | Status: DC
  Administered 2018-04-13 – 2018-04-15 (×5): 3 mL via INTRAVENOUS

## 2018-04-12 MED ORDER — TRAMADOL HCL 50 MG PO TABS
50.0000 mg | ORAL_TABLET | ORAL | Status: DC | PRN
  Administered 2018-04-13 – 2018-04-16 (×3): 100 mg via ORAL
  Filled 2018-04-12 (×3): qty 2

## 2018-04-12 MED ORDER — BISACODYL 10 MG RE SUPP: 10.0000 mg | Freq: Every day | RECTAL | Status: DC

## 2018-04-12 MED ORDER — SODIUM CHLORIDE 0.9 % IV SOLN: 250.0000 mL | INTRAVENOUS | Status: DC

## 2018-04-12 MED ORDER — NITROGLYCERIN IN D5W 200-5 MCG/ML-% IV SOLN
2.0000 ug/min | INTRAVENOUS | Status: AC
  Administered 2018-04-12: 15 ug/min via INTRAVENOUS
  Filled 2018-04-12: qty 250

## 2018-04-12 MED ORDER — NITROGLYCERIN 1 MG/10 ML FOR IR/CATH LAB
INTRA_ARTERIAL | Status: AC
  Filled 2018-04-12: qty 10

## 2018-04-12 MED ORDER — DEXMEDETOMIDINE HCL IN NACL 200 MCG/50ML IV SOLN
0.0000 ug/kg/h | INTRAVENOUS | Status: DC
  Administered 2018-04-12 – 2018-04-13 (×2): 0.7 ug/kg/h via INTRAVENOUS
  Filled 2018-04-12: qty 50

## 2018-04-12 MED ORDER — OXYCODONE HCL 5 MG PO TABS
5.0000 mg | ORAL_TABLET | ORAL | Status: DC | PRN
  Administered 2018-04-13 – 2018-04-15 (×4): 10 mg via ORAL
  Filled 2018-04-12 (×4): qty 2

## 2018-04-12 MED ORDER — SODIUM CHLORIDE 0.9 % IV SOLN
INTRAVENOUS | Status: AC | PRN
  Administered 2018-04-12: 50 mL/h via INTRAVENOUS

## 2018-04-12 MED ORDER — ASPIRIN EC 81 MG PO TBEC
81.0000 mg | DELAYED_RELEASE_TABLET | Freq: Every day | ORAL | Status: DC
  Administered 2018-04-12: 81 mg via ORAL
  Filled 2018-04-12: qty 1

## 2018-04-12 MED ORDER — SODIUM CHLORIDE 0.9 % WEIGHT BASED INFUSION: 3.0000 mL/kg/h | INTRAVENOUS | Status: DC

## 2018-04-12 MED ORDER — SODIUM CHLORIDE 0.9 % IV SOLN
INTRAVENOUS | Status: AC
  Administered 2018-04-12: 100 mL/h via INTRAVENOUS

## 2018-04-12 MED ORDER — FENTANYL CITRATE (PF) 100 MCG/2ML IJ SOLN
INTRAMUSCULAR | Status: DC | PRN
  Administered 2018-04-12: 25 ug via INTRAVENOUS

## 2018-04-12 MED ORDER — ACETAMINOPHEN 160 MG/5ML PO SOLN: 1000.0000 mg | Freq: Four times a day (QID) | ORAL | Status: DC

## 2018-04-12 MED ORDER — SODIUM CHLORIDE 0.9 % IV SOLN
750.0000 mg | INTRAVENOUS | Status: DC
  Filled 2018-04-12: qty 750

## 2018-04-12 MED ORDER — SODIUM CHLORIDE 0.9 % IV SOLN
INTRAVENOUS | Status: DC
  Filled 2018-04-12: qty 30

## 2018-04-12 MED ORDER — SODIUM CHLORIDE 0.9 % IV SOLN: INTRAVENOUS | Status: DC

## 2018-04-12 MED ORDER — LIDOCAINE HCL (PF) 1 % IJ SOLN
INTRAMUSCULAR | Status: DC | PRN
  Administered 2018-04-12: 5 mL via SUBCUTANEOUS

## 2018-04-12 MED ORDER — PROPOFOL 10 MG/ML IV BOLUS
INTRAVENOUS | Status: DC | PRN
  Administered 2018-04-12: 70 mg via INTRAVENOUS

## 2018-04-12 MED ORDER — TIROFIBAN HCL IN NACL 5-0.9 MG/100ML-% IV SOLN
INTRAVENOUS | Status: AC
  Filled 2018-04-12: qty 100

## 2018-04-12 MED ORDER — ASPIRIN 300 MG RE SUPP: 300.0000 mg | RECTAL | Status: AC

## 2018-04-12 MED ORDER — SODIUM CHLORIDE (PF) 0.9 % IJ SOLN
OROMUCOSAL | Status: DC | PRN
  Administered 2018-04-12 (×3): 4 mL via TOPICAL

## 2018-04-12 MED ORDER — FAMOTIDINE IN NACL 20-0.9 MG/50ML-% IV SOLN
20.0000 mg | Freq: Two times a day (BID) | INTRAVENOUS | Status: AC
  Administered 2018-04-12 – 2018-04-13 (×2): 20 mg via INTRAVENOUS
  Filled 2018-04-12: qty 50

## 2018-04-12 MED ORDER — SODIUM CHLORIDE 0.9 % IV SOLN
1.5000 g | INTRAVENOUS | Status: AC
  Administered 2018-04-12: 1.5 g via INTRAVENOUS
  Filled 2018-04-12: qty 1.5

## 2018-04-12 MED ORDER — BISACODYL 5 MG PO TBEC
10.0000 mg | DELAYED_RELEASE_TABLET | Freq: Every day | ORAL | Status: DC
  Administered 2018-04-13 – 2018-04-15 (×3): 10 mg via ORAL
  Filled 2018-04-12 (×3): qty 2

## 2018-04-12 MED ORDER — ASPIRIN 81 MG PO CHEW
324.0000 mg | CHEWABLE_TABLET | ORAL | Status: AC
  Administered 2018-04-12: 324 mg via ORAL
  Filled 2018-04-12: qty 4

## 2018-04-12 MED ORDER — NITROGLYCERIN IN D5W 200-5 MCG/ML-% IV SOLN
INTRAVENOUS | Status: AC
  Administered 2018-04-12: 5 mg
  Filled 2018-04-12: qty 250

## 2018-04-12 MED ORDER — HEPARIN (PORCINE) IN NACL 1000-0.9 UT/500ML-% IV SOLN
INTRAVENOUS | Status: AC
  Filled 2018-04-12: qty 1000

## 2018-04-12 MED ORDER — PANTOPRAZOLE SODIUM 40 MG PO TBEC
40.0000 mg | DELAYED_RELEASE_TABLET | Freq: Every day | ORAL | Status: DC
  Administered 2018-04-14 – 2018-04-16 (×3): 40 mg via ORAL
  Filled 2018-04-12 (×3): qty 1

## 2018-04-12 MED ORDER — MORPHINE SULFATE (PF) 2 MG/ML IV SOLN
1.0000 mg | INTRAVENOUS | Status: DC | PRN
  Administered 2018-04-13 (×2): 2 mg via INTRAVENOUS
  Administered 2018-04-14 (×3): 4 mg via INTRAVENOUS
  Filled 2018-04-12: qty 2
  Filled 2018-04-12 (×2): qty 1
  Filled 2018-04-12 (×2): qty 2

## 2018-04-12 MED ORDER — INSULIN REGULAR(HUMAN) IN NACL 100-0.9 UT/100ML-% IV SOLN
INTRAVENOUS | Status: DC
  Administered 2018-04-12: 1.4 [IU]/h via INTRAVENOUS

## 2018-04-12 MED ORDER — MIDAZOLAM HCL 2 MG/2ML IJ SOLN
INTRAMUSCULAR | Status: DC | PRN
  Administered 2018-04-12: 1 mg via INTRAVENOUS

## 2018-04-12 MED ORDER — NITROGLYCERIN IN D5W 200-5 MCG/ML-% IV SOLN: 0.0000 ug/min | INTRAVENOUS | Status: DC

## 2018-04-12 MED ORDER — FENTANYL CITRATE (PF) 250 MCG/5ML IJ SOLN
INTRAMUSCULAR | Status: AC
  Filled 2018-04-12: qty 20

## 2018-04-12 MED ORDER — LACTATED RINGERS IV SOLN: INTRAVENOUS | Status: DC

## 2018-04-12 MED ORDER — HEPARIN SODIUM (PORCINE) 1000 UNIT/ML IJ SOLN
INTRAMUSCULAR | Status: DC | PRN
  Administered 2018-04-12: 33000 [IU] via INTRAVENOUS

## 2018-04-12 MED ORDER — LIDOCAINE 2% (20 MG/ML) 5 ML SYRINGE
INTRAMUSCULAR | Status: DC | PRN
  Administered 2018-04-12: 100 mg via INTRAVENOUS

## 2018-04-12 MED ORDER — ASPIRIN EC 325 MG PO TBEC: 325.0000 mg | DELAYED_RELEASE_TABLET | Freq: Every day | ORAL | Status: DC

## 2018-04-12 MED ORDER — MILRINONE LACTATE IN DEXTROSE 20-5 MG/100ML-% IV SOLN
0.3000 ug/kg/min | INTRAVENOUS | Status: DC
  Filled 2018-04-12 (×2): qty 100

## 2018-04-12 MED ORDER — MORPHINE SULFATE (PF) 2 MG/ML IV SOLN
INTRAVENOUS | Status: AC
  Filled 2018-04-12: qty 1

## 2018-04-12 MED ORDER — ONDANSETRON HCL 4 MG/2ML IJ SOLN
4.0000 mg | Freq: Four times a day (QID) | INTRAMUSCULAR | Status: DC | PRN
  Administered 2018-04-13 – 2018-04-15 (×3): 4 mg via INTRAVENOUS
  Filled 2018-04-12 (×3): qty 2

## 2018-04-12 MED ORDER — PHENYLEPHRINE HCL-NACL 20-0.9 MG/250ML-% IV SOLN
30.0000 ug/min | INTRAVENOUS | Status: AC
  Administered 2018-04-12: 50 ug/min via INTRAVENOUS
  Filled 2018-04-12 (×2): qty 250

## 2018-04-12 MED ORDER — FENTANYL CITRATE (PF) 100 MCG/2ML IJ SOLN
100.0000 ug | Freq: Once | INTRAMUSCULAR | Status: AC
  Administered 2018-04-12: 100 ug via INTRAVENOUS
  Filled 2018-04-12: qty 2

## 2018-04-12 MED ORDER — HEPARIN SODIUM (PORCINE) 1000 UNIT/ML IJ SOLN
INTRAMUSCULAR | Status: AC
  Filled 2018-04-12: qty 1

## 2018-04-12 MED ORDER — MIDAZOLAM HCL 2 MG/2ML IJ SOLN
INTRAMUSCULAR | Status: AC
  Filled 2018-04-12: qty 2

## 2018-04-12 MED ORDER — MORPHINE SULFATE (PF) 2 MG/ML IV SOLN
INTRAVENOUS | Status: AC
  Administered 2018-04-12: 2 mg via INTRAVENOUS
  Filled 2018-04-12: qty 1

## 2018-04-12 MED ORDER — ARTIFICIAL TEARS OPHTHALMIC OINT
TOPICAL_OINTMENT | OPHTHALMIC | Status: DC | PRN
  Administered 2018-04-12: 1 via OPHTHALMIC

## 2018-04-12 MED ORDER — ASPIRIN 81 MG PO CHEW
81.0000 mg | CHEWABLE_TABLET | ORAL | Status: DC
  Filled 2018-04-12: qty 1

## 2018-04-12 MED ORDER — MIDAZOLAM HCL 2 MG/2ML IJ SOLN
2.0000 mg | INTRAMUSCULAR | Status: DC | PRN
  Filled 2018-04-12: qty 2

## 2018-04-12 MED ORDER — POTASSIUM CHLORIDE 2 MEQ/ML IV SOLN
80.0000 meq | INTRAVENOUS | Status: DC
  Filled 2018-04-12: qty 40

## 2018-04-12 MED ORDER — ACETAMINOPHEN 160 MG/5ML PO SOLN
650.0000 mg | Freq: Once | ORAL | Status: AC
  Administered 2018-04-12: 650 mg

## 2018-04-12 MED ORDER — HEPARIN (PORCINE) IN NACL 1000-0.9 UT/500ML-% IV SOLN
INTRAVENOUS | Status: DC | PRN
  Administered 2018-04-12 (×2): 500 mL

## 2018-04-12 MED ORDER — IOHEXOL 350 MG/ML SOLN
INTRAVENOUS | Status: DC | PRN
  Administered 2018-04-12: 75 mL via INTRA_ARTERIAL

## 2018-04-12 MED ORDER — SODIUM CHLORIDE 0.45 % IV SOLN
INTRAVENOUS | Status: DC | PRN
  Administered 2018-04-12: 20 mL/h via INTRAVENOUS

## 2018-04-12 MED ORDER — SODIUM CHLORIDE 0.9% FLUSH: 3.0000 mL | Freq: Two times a day (BID) | INTRAVENOUS | Status: DC

## 2018-04-12 MED ORDER — PHENYLEPHRINE HCL-NACL 20-0.9 MG/250ML-% IV SOLN
0.0000 ug/min | INTRAVENOUS | Status: DC
  Administered 2018-04-13: 5 ug/min via INTRAVENOUS

## 2018-04-12 MED ORDER — PROPOFOL 10 MG/ML IV BOLUS
INTRAVENOUS | Status: AC
  Filled 2018-04-12: qty 20

## 2018-04-12 MED ORDER — INSULIN REGULAR(HUMAN) IN NACL 100-0.9 UT/100ML-% IV SOLN
INTRAVENOUS | Status: AC
  Administered 2018-04-12: 1 [IU]/h via INTRAVENOUS
  Filled 2018-04-12: qty 100

## 2018-04-12 MED ORDER — LACTATED RINGERS IV SOLN: 500.0000 mL | Freq: Once | INTRAVENOUS | Status: DC | PRN

## 2018-04-12 MED ORDER — INSULIN REGULAR BOLUS VIA INFUSION
0.0000 [IU] | Freq: Three times a day (TID) | INTRAVENOUS | Status: DC
  Filled 2018-04-12: qty 10

## 2018-04-12 MED ORDER — PROTAMINE SULFATE 10 MG/ML IV SOLN
INTRAVENOUS | Status: DC | PRN
  Administered 2018-04-12: 330 mg via INTRAVENOUS

## 2018-04-12 MED ORDER — MIDAZOLAM HCL 5 MG/5ML IJ SOLN
INTRAMUSCULAR | Status: DC | PRN
  Administered 2018-04-12 (×2): 1 mg via INTRAVENOUS
  Administered 2018-04-12: 2 mg via INTRAVENOUS
  Administered 2018-04-12: 1 mg via INTRAVENOUS
  Administered 2018-04-12: 2 mg via INTRAVENOUS
  Administered 2018-04-12: 3 mg via INTRAVENOUS

## 2018-04-12 MED ORDER — SODIUM CHLORIDE 0.9 % WEIGHT BASED INFUSION: 1.0000 mL/kg/h | INTRAVENOUS | Status: DC

## 2018-04-12 MED ORDER — VANCOMYCIN HCL 1000 MG IV SOLR
INTRAVENOUS | Status: AC
  Administered 2018-04-12: 1000 mL
  Filled 2018-04-12: qty 1000

## 2018-04-12 MED ORDER — FENTANYL CITRATE (PF) 250 MCG/5ML IJ SOLN
INTRAMUSCULAR | Status: DC | PRN
  Administered 2018-04-12: 250 ug via INTRAVENOUS
  Administered 2018-04-12: 100 ug via INTRAVENOUS
  Administered 2018-04-12 (×2): 250 ug via INTRAVENOUS
  Administered 2018-04-12: 150 ug via INTRAVENOUS

## 2018-04-12 MED ORDER — METOPROLOL TARTRATE 50 MG PO TABS
50.0000 mg | ORAL_TABLET | Freq: Two times a day (BID) | ORAL | Status: DC
  Administered 2018-04-12: 50 mg via ORAL
  Filled 2018-04-12: qty 2

## 2018-04-12 MED ORDER — ALBUMIN HUMAN 5 % IV SOLN
250.0000 mL | INTRAVENOUS | Status: AC | PRN
  Administered 2018-04-12 – 2018-04-13 (×3): 12.5 g via INTRAVENOUS
  Filled 2018-04-12: qty 250

## 2018-04-12 MED ORDER — TRANEXAMIC ACID (OHS) PUMP PRIME SOLUTION
2.0000 mg/kg | INTRAVENOUS | Status: DC
  Filled 2018-04-12: qty 2.23

## 2018-04-12 MED ORDER — DOPAMINE-DEXTROSE 3.2-5 MG/ML-% IV SOLN
0.0000 ug/kg/min | INTRAVENOUS | Status: DC
  Filled 2018-04-12: qty 250

## 2018-04-12 MED ORDER — MAGNESIUM SULFATE 4 GM/100ML IV SOLN
4.0000 g | Freq: Once | INTRAVENOUS | Status: AC
  Administered 2018-04-12: 4 g via INTRAVENOUS
  Filled 2018-04-12: qty 100

## 2018-04-12 MED ORDER — PLASMA-LYTE 148 IV SOLN
INTRAVENOUS | Status: AC
  Administered 2018-04-12: 500 mL
  Filled 2018-04-12: qty 2.5

## 2018-04-12 MED ORDER — SODIUM CHLORIDE 0.9 % IV SOLN: 1.5000 g | INTRAVENOUS | Status: DC

## 2018-04-12 MED ORDER — CHLORHEXIDINE GLUCONATE 0.12 % MT SOLN
15.0000 mL | OROMUCOSAL | Status: AC
  Administered 2018-04-12: 15 mL via OROMUCOSAL

## 2018-04-12 MED ORDER — NITROGLYCERIN 0.4 MG SL SUBL
0.4000 mg | SUBLINGUAL_TABLET | SUBLINGUAL | Status: DC | PRN
  Administered 2018-04-12 (×2): 0.4 mg via SUBLINGUAL
  Filled 2018-04-12: qty 1

## 2018-04-12 MED ORDER — INSULIN ASPART 100 UNIT/ML ~~LOC~~ SOLN: 0.0000 [IU] | Freq: Three times a day (TID) | SUBCUTANEOUS | Status: DC

## 2018-04-12 MED ORDER — SODIUM CHLORIDE 0.9 % IV SOLN: 250.0000 mL | INTRAVENOUS | Status: DC | PRN

## 2018-04-12 MED ORDER — METOPROLOL TARTRATE 5 MG/5ML IV SOLN: 2.5000 mg | INTRAVENOUS | Status: DC | PRN

## 2018-04-12 MED ORDER — MORPHINE SULFATE (PF) 4 MG/ML IV SOLN
4.0000 mg | Freq: Once | INTRAVENOUS | Status: DC
  Filled 2018-04-12: qty 1

## 2018-04-12 MED ORDER — METOPROLOL TARTRATE 25 MG/10 ML ORAL SUSPENSION: 12.5000 mg | Freq: Two times a day (BID) | ORAL | Status: DC

## 2018-04-12 MED ORDER — ASPIRIN 81 MG PO CHEW: 324.0000 mg | CHEWABLE_TABLET | Freq: Every day | ORAL | Status: DC

## 2018-04-12 MED ORDER — ACETAMINOPHEN 325 MG PO TABS
650.0000 mg | ORAL_TABLET | ORAL | Status: DC | PRN
  Filled 2018-04-12: qty 2

## 2018-04-12 MED ORDER — VERAPAMIL HCL 2.5 MG/ML IV SOLN
INTRAVENOUS | Status: AC
  Filled 2018-04-12: qty 2

## 2018-04-12 MED ORDER — MAGNESIUM SULFATE 50 % IJ SOLN
40.0000 meq | INTRAMUSCULAR | Status: DC
  Filled 2018-04-12 (×2): qty 9.85

## 2018-04-12 MED ORDER — VERAPAMIL HCL 2.5 MG/ML IV SOLN
INTRAVENOUS | Status: DC | PRN
  Administered 2018-04-12: 10 mL via INTRA_ARTERIAL

## 2018-04-12 MED ORDER — 0.9 % SODIUM CHLORIDE (POUR BTL) OPTIME
TOPICAL | Status: DC | PRN
  Administered 2018-04-12: 6000 mL

## 2018-04-12 MED ORDER — NITROGLYCERIN 2 % TD OINT
0.5000 [in_us] | TOPICAL_OINTMENT | Freq: Four times a day (QID) | TRANSDERMAL | Status: DC
  Administered 2018-04-12 (×2): 0.5 [in_us] via TOPICAL
  Filled 2018-04-12 (×3): qty 1

## 2018-04-12 MED ORDER — METOPROLOL TARTRATE 12.5 MG HALF TABLET
12.5000 mg | ORAL_TABLET | Freq: Two times a day (BID) | ORAL | Status: DC
  Administered 2018-04-13 – 2018-04-16 (×6): 12.5 mg via ORAL
  Filled 2018-04-12 (×7): qty 1

## 2018-04-12 MED ORDER — VANCOMYCIN HCL 10 G IV SOLR: 1500.0000 mg | INTRAVENOUS | Status: DC

## 2018-04-12 MED ORDER — VANCOMYCIN HCL IN DEXTROSE 1-5 GM/200ML-% IV SOLN
1000.0000 mg | Freq: Once | INTRAVENOUS | Status: AC
  Administered 2018-04-13: 1000 mg via INTRAVENOUS
  Filled 2018-04-12: qty 200

## 2018-04-12 MED ORDER — CLOPIDOGREL BISULFATE 300 MG PO TABS
600.0000 mg | ORAL_TABLET | Freq: Once | ORAL | Status: AC
  Administered 2018-04-12: 600 mg via ORAL
  Filled 2018-04-12: qty 2

## 2018-04-12 MED ORDER — FENTANYL CITRATE (PF) 100 MCG/2ML IJ SOLN
INTRAMUSCULAR | Status: AC
  Filled 2018-04-12: qty 2

## 2018-04-12 MED ORDER — SODIUM CHLORIDE 0.9 % IV SOLN
750.0000 mg | INTRAVENOUS | Status: AC
  Administered 2018-04-12: 750 mg via INTRAVENOUS
  Filled 2018-04-12: qty 750

## 2018-04-12 MED ORDER — FENTANYL CITRATE (PF) 100 MCG/2ML IJ SOLN: 50.0000 ug | INTRAMUSCULAR | Status: DC | PRN

## 2018-04-12 SURGICAL SUPPLY — 106 items
ADAPTER CARDIO PERF ANTE/RETRO (ADAPTER) IMPLANT
BAG DECANTER FOR FLEXI CONT (MISCELLANEOUS) ×8 IMPLANT
BANDAGE ACE 4X5 VEL STRL LF (GAUZE/BANDAGES/DRESSINGS) ×4 IMPLANT
BANDAGE ACE 6X5 VEL STRL LF (GAUZE/BANDAGES/DRESSINGS) ×4 IMPLANT
BANDAGE ELASTIC 4 VELCRO ST LF (GAUZE/BANDAGES/DRESSINGS) ×4 IMPLANT
BANDAGE ELASTIC 6 VELCRO ST LF (GAUZE/BANDAGES/DRESSINGS) ×4 IMPLANT
BASKET HEART  (ORDER IN 25'S) (MISCELLANEOUS) ×1
BASKET HEART (ORDER IN 25'S) (MISCELLANEOUS) ×1
BASKET HEART (ORDER IN 25S) (MISCELLANEOUS) ×2 IMPLANT
BLADE CLIPPER SURG (BLADE) IMPLANT
BLADE STERNUM SYSTEM 6 (BLADE) ×4 IMPLANT
BNDG GAUZE ELAST 4 BULKY (GAUZE/BANDAGES/DRESSINGS) ×4 IMPLANT
CANISTER SUCT 3000ML PPV (MISCELLANEOUS) ×4 IMPLANT
CANNULA EZ GLIDE AORTIC 21FR (CANNULA) ×8 IMPLANT
CATH CPB KIT OWEN (MISCELLANEOUS) ×4 IMPLANT
CATH THORACIC 36FR (CATHETERS) ×4 IMPLANT
CLIP RETRACTION 3.0MM CORONARY (MISCELLANEOUS) ×8 IMPLANT
CLIP VESOCCLUDE MED 24/CT (CLIP) IMPLANT
CLIP VESOCCLUDE SM WIDE 24/CT (CLIP) IMPLANT
CONN ST 1/4X3/8  BEN (MISCELLANEOUS) ×2
CONN ST 1/4X3/8 BEN (MISCELLANEOUS) ×2 IMPLANT
COVER WAND RF STERILE (DRAPES) ×4 IMPLANT
CRADLE DONUT ADULT HEAD (MISCELLANEOUS) ×4 IMPLANT
DERMABOND ADVANCED (GAUZE/BANDAGES/DRESSINGS) ×2
DERMABOND ADVANCED .7 DNX12 (GAUZE/BANDAGES/DRESSINGS) ×2 IMPLANT
DRAIN CHANNEL 32F RND 10.7 FF (WOUND CARE) ×8 IMPLANT
DRAPE CARDIOVASCULAR INCISE (DRAPES) ×2
DRAPE INCISE IOBAN 66X45 STRL (DRAPES) ×8 IMPLANT
DRAPE SLUSH/WARMER DISC (DRAPES) ×4 IMPLANT
DRAPE SRG 135X102X78XABS (DRAPES) ×2 IMPLANT
DRSG AQUACEL AG ADV 3.5X14 (GAUZE/BANDAGES/DRESSINGS) ×4 IMPLANT
DRSG COVADERM 4X14 (GAUZE/BANDAGES/DRESSINGS) ×4 IMPLANT
ELECT BLADE 4.0 EZ CLEAN MEGAD (MISCELLANEOUS) ×4
ELECT REM PT RETURN 9FT ADLT (ELECTROSURGICAL) ×8
ELECTRODE BLDE 4.0 EZ CLN MEGD (MISCELLANEOUS) ×2 IMPLANT
ELECTRODE REM PT RTRN 9FT ADLT (ELECTROSURGICAL) ×4 IMPLANT
FELT TEFLON 1X6 (MISCELLANEOUS) ×8 IMPLANT
GAUZE SPONGE 4X4 12PLY STRL (GAUZE/BANDAGES/DRESSINGS) ×8 IMPLANT
GAUZE SPONGE 4X4 12PLY STRL LF (GAUZE/BANDAGES/DRESSINGS) ×8 IMPLANT
GLOVE BIO SURGEON STRL SZ 6.5 (GLOVE) ×15 IMPLANT
GLOVE BIO SURGEON STRL SZ7.5 (GLOVE) ×8 IMPLANT
GLOVE BIO SURGEONS STRL SZ 6.5 (GLOVE) ×5
GLOVE ORTHO TXT STRL SZ7.5 (GLOVE) ×8 IMPLANT
GOWN STRL REUS W/ TWL LRG LVL3 (GOWN DISPOSABLE) ×16 IMPLANT
GOWN STRL REUS W/TWL LRG LVL3 (GOWN DISPOSABLE) ×16
HEMOSTAT POWDER SURGIFOAM 1G (HEMOSTASIS) ×12 IMPLANT
INSERT FOGARTY XLG (MISCELLANEOUS) ×4 IMPLANT
KIT BASIN OR (CUSTOM PROCEDURE TRAY) ×4 IMPLANT
KIT SUCTION CATH 14FR (SUCTIONS) ×12 IMPLANT
KIT TURNOVER KIT B (KITS) ×4 IMPLANT
KIT VASOVIEW HEMOPRO 2 VH 4000 (KITS) ×4 IMPLANT
LEAD PACING MYOCARDI (MISCELLANEOUS) ×4 IMPLANT
MARKER GRAFT CORONARY BYPASS (MISCELLANEOUS) ×12 IMPLANT
NS IRRIG 1000ML POUR BTL (IV SOLUTION) ×24 IMPLANT
PACK E OPEN HEART (SUTURE) ×4 IMPLANT
PACK OPEN HEART (CUSTOM PROCEDURE TRAY) ×4 IMPLANT
PAD ARMBOARD 7.5X6 YLW CONV (MISCELLANEOUS) ×8 IMPLANT
PAD ELECT DEFIB RADIOL ZOLL (MISCELLANEOUS) ×4 IMPLANT
PENCIL BUTTON HOLSTER BLD 10FT (ELECTRODE) ×4 IMPLANT
PUNCH AORTIC ROTATE 4.0MM (MISCELLANEOUS) ×4 IMPLANT
PUNCH AORTIC ROTATE 4.5MM 8IN (MISCELLANEOUS) IMPLANT
PUNCH AORTIC ROTATE 5MM 8IN (MISCELLANEOUS) IMPLANT
SET CARDIOPLEGIA MPS 5001102 (MISCELLANEOUS) ×4 IMPLANT
SOLUTION ANTI FOG 6CC (MISCELLANEOUS) ×4 IMPLANT
SPONGE LAP 18X18 X RAY DECT (DISPOSABLE) IMPLANT
SPONGE LAP 4X18 RFD (DISPOSABLE) ×4 IMPLANT
SUT BONE WAX W31G (SUTURE) ×4 IMPLANT
SUT ETHIBOND X763 2 0 SH 1 (SUTURE) ×8 IMPLANT
SUT MNCRL AB 3-0 PS2 18 (SUTURE) ×8 IMPLANT
SUT MNCRL AB 4-0 PS2 18 (SUTURE) IMPLANT
SUT PDS AB 1 CTX 36 (SUTURE) ×8 IMPLANT
SUT PROLENE 2 0 SH DA (SUTURE) IMPLANT
SUT PROLENE 3 0 SH DA (SUTURE) ×4 IMPLANT
SUT PROLENE 3 0 SH1 36 (SUTURE) IMPLANT
SUT PROLENE 4 0 RB 1 (SUTURE) ×6
SUT PROLENE 4 0 SH DA (SUTURE) ×4 IMPLANT
SUT PROLENE 4-0 RB1 .5 CRCL 36 (SUTURE) ×6 IMPLANT
SUT PROLENE 5 0 C 1 36 (SUTURE) IMPLANT
SUT PROLENE 6 0 C 1 30 (SUTURE) ×8 IMPLANT
SUT PROLENE 7.0 RB 3 (SUTURE) ×12 IMPLANT
SUT PROLENE 8 0 BV175 6 (SUTURE) ×4 IMPLANT
SUT PROLENE BLUE 7 0 (SUTURE) ×4 IMPLANT
SUT PROLENE POLY MONO (SUTURE) IMPLANT
SUT SILK  1 MH (SUTURE) ×2
SUT SILK 1 MH (SUTURE) ×2 IMPLANT
SUT STEEL 6MS V (SUTURE) IMPLANT
SUT STEEL STERNAL CCS#1 18IN (SUTURE) IMPLANT
SUT STEEL SZ 6 DBL 3X14 BALL (SUTURE) IMPLANT
SUT VIC AB 1 CTX 36 (SUTURE)
SUT VIC AB 1 CTX36XBRD ANBCTR (SUTURE) IMPLANT
SUT VIC AB 2-0 CT1 27 (SUTURE) ×2
SUT VIC AB 2-0 CT1 TAPERPNT 27 (SUTURE) ×2 IMPLANT
SUT VIC AB 2-0 CTX 27 (SUTURE) IMPLANT
SUT VIC AB 3-0 SH 27 (SUTURE)
SUT VIC AB 3-0 SH 27X BRD (SUTURE) IMPLANT
SUT VIC AB 3-0 X1 27 (SUTURE) ×4 IMPLANT
SUT VICRYL 4-0 PS2 18IN ABS (SUTURE) IMPLANT
SYSTEM SAHARA CHEST DRAIN ATS (WOUND CARE) ×4 IMPLANT
TAPE CLOTH SURG 4X10 WHT LF (GAUZE/BANDAGES/DRESSINGS) ×4 IMPLANT
TAPE PAPER 2X10 WHT MICROPORE (GAUZE/BANDAGES/DRESSINGS) ×4 IMPLANT
TOWEL GREEN STERILE (TOWEL DISPOSABLE) ×4 IMPLANT
TOWEL GREEN STERILE FF (TOWEL DISPOSABLE) ×4 IMPLANT
TRAY FOLEY SLVR 16FR TEMP STAT (SET/KITS/TRAYS/PACK) ×4 IMPLANT
TUBING INSUFFLATION (TUBING) ×4 IMPLANT
UNDERPAD 30X30 (UNDERPADS AND DIAPERS) ×4 IMPLANT
WATER STERILE IRR 1000ML POUR (IV SOLUTION) ×8 IMPLANT

## 2018-04-12 SURGICAL SUPPLY — 12 items
CATH 5FR JL3.5 JR4 ANG PIG MP (CATHETERS) ×2 IMPLANT
CATH LAUNCHER 6FR EBU3.5 (CATHETERS) ×2 IMPLANT
ELECT DEFIB PAD ADLT CADENCE (PAD) ×2 IMPLANT
GLIDESHEATH SLEND SS 6F .021 (SHEATH) ×2 IMPLANT
GUIDEWIRE INQWIRE 1.5J.035X260 (WIRE) ×1 IMPLANT
INQWIRE 1.5J .035X260CM (WIRE) ×2
KIT ENCORE 26 ADVANTAGE (KITS) ×2 IMPLANT
KIT HEART LEFT (KITS) ×2 IMPLANT
PACK CARDIAC CATHETERIZATION (CUSTOM PROCEDURE TRAY) ×2 IMPLANT
SYR MEDRAD MARK 7 150ML (SYRINGE) ×2 IMPLANT
TRANSDUCER W/STOPCOCK (MISCELLANEOUS) ×2 IMPLANT
TUBING CIL FLEX 10 FLL-RA (TUBING) ×2 IMPLANT

## 2018-04-12 NOTE — Progress Notes (Signed)
  Echocardiogram 2D Echocardiogram has been performed.  Jennette Dubin 04/12/2018, 3:24 PM

## 2018-04-12 NOTE — H&P (Addendum)
History and Physical:   Patient ID: [name]; [id number]; [birth year]   Admit date: 04/12/2018 Date of Consult: 04/12/2018  Primary Care Provider: Venia Carbon, MD Primary Cardiologist: New-Dr. [name], MD   Patient Profile:   [name] is a 62 y.o. male with a hx of CAD per cath 2007 (50-70% LAD, 40% LCX), hypertension, hyperlipidemia, DM2 and GERD who is being seen today for the evaluation of chest pain.  History of Present Illness:   [name] is a 62 year old man with a history stated above who initially presented to [facility] on 04/11/2018 and was seen for foreign body (metal) in his right eye.  He reported working with metal earlier that day and was wearing safety glasses and unfortunately had metal shavings fall from his hair to his eye.  He was treated and released from the emergency department.  Patient states while walking from the ED to his car he began having midsternal chest pain rated an 8/10 in severity.  He was with his wife and they sat in the car for approximately 10 minutes before driving home.  He states he was woken early this morning around 5 AM with similar chest discomfort in which he proceeded to the ED for further evaluation.  In speaking with him, he states that he has had intermittent chest pain over the last several months which worsens with walking and is relieved with rest.  He denies radiation, shortness of breath, dizziness, diaphoresis, orthopnea, LE swelling, PND or syncope.  Risk factors include prior borderline CAD, DM2, hypertension and strong family history of CAD in his mother, father and premature MI in his brother.  He denies tobacco, alcohol or illicit drug use.  He was previously followed by Dr. Tamala Julian in 2007 however has not seen him in quite some time.  He follows regularly with Dr. Silvio Pate, his PCP.  In the ED, he was given ASA 81 and 1 SL NTG with relief.  EKG with NSR and no acute ischemic changes.  CXR with new/progression of peribronchial  thickening favoring bronchitic change over pulmonary edema.  Initial i-STAT troponin positive at 0.14 with repeat delta troponin at 0.21 concerning for ACS.  Past Medical History:  Diagnosis Date  . CAD (coronary artery disease) 7/07   Cath for unstable angina--- 50-70% LAD, 40% circ  . ED (erectile dysfunction)   . GERD (gastroesophageal reflux disease)   . Hx of colonic polyps   . Hyperlipidemia   . Hypertension   . Impaired fasting glucose   . Sleep disorder   . Wrist fracture     6 foot fall onto head, concussion, L wrist (comminuted with intra-articular extension) and 2nd finger (middle phalanx) fxs, vertigo    Past Surgical History:  Procedure Laterality Date  . BRANCHIAL CLEFT CYST EXCISION    . COLONOSCOPY  04/03/2007  . HEMORRHOID SURGERY    . RHINOPLASTY       Prior to Admission medications   Medication Sig Start Date End Date Taking? Authorizing Provider  aspirin EC 81 MG tablet Take 81 mg by mouth daily.   Yes [provider]  metFORMIN (GLUCOPHAGE-XR) 500 MG 24 hr tablet Take 1 tablet (500 mg total) by mouth daily with breakfast. 02/13/18  Yes Venia Carbon, MD  metoprolol tartrate (LOPRESSOR) 50 MG tablet TAKE 1 TABLET BY MOUTH DAILY Patient taking differently: Take 50 mg by mouth 2 (two) times daily.  04/06/18  Yes Venia Carbon, MD  Multiple Vitamin (MULTIVITAMIN) tablet Take  1 tablet by mouth daily.   Yes [provider]  nitroGLYCERIN (NITROSTAT) 0.4 MG SL tablet DISSOLVE 1 TABLET UNDER THE TONGUE FOR CHEST PAIN. MAY REPEAT EVERY 5MINUTES UP TO 3 DOSES. IF NO RELIEF, CALL 911** 01/21/17  Yes Venia Carbon, MD  Omega-3 Fatty Acids (OMEGA-3 FISH OIL PO) Take 1,000 mg by mouth daily.   Yes [provider]  omeprazole (PRILOSEC) 20 MG capsule TAKE ONE CAPSULE BY MOUTH DAILY Patient taking differently: Take 20 mg by mouth daily.  02/19/18  Yes Venia Carbon, MD  azithromycin (ZITHROMAX Z-PAK) 250 MG tablet Take 2 tablets (500  mg) on  Day 1,  followed by 1 tablet (250 mg) once daily on Days 2 through 5. Patient not taking: Reported on 04/12/2018 03/30/18   Venia Carbon, MD  benzonatate (TESSALON) 100 MG capsule Take 1 capsule (100 mg total) by mouth every 8 (eight) hours. Patient not taking: Reported on 04/12/2018 03/27/18   Loura Halt A, NP  diclofenac (VOLTAREN) 75 MG EC tablet Take 1 tablet (75 mg total) by mouth 2 (two) times daily. Patient not taking: Reported on 04/12/2018 08/18/17   Venia Carbon, MD  fluticasone [facility]) 50 MCG/ACT nasal spray USE TWO SPRAY IN [facility] NOSTRIL DAILY Patient not taking: Reported on 04/12/2018 02/13/18   Venia Carbon, MD  HYDROcodone-homatropine [facility]) 5-1.5 MG/5ML syrup Take 5 mLs by mouth at bedtime as needed for cough. Patient not taking: Reported on 04/12/2018 03/30/18   Venia Carbon, MD  traZODone (DESYREL) 100 MG tablet Take 1-2 tablets (100-200 mg total) by mouth at bedtime as needed for sleep. Patient not taking: Reported on 04/12/2018 09/06/15   Venia Carbon, MD    Inpatient Medications: Scheduled Meds: . nitroGLYCERIN  0.5 inch Topical Q6H   Continuous Infusions: . heparin 1,400 Units/hr (04/12/18 0801)   PRN Meds: fentaNYL (SUBLIMAZE) injection  Allergies:    Allergies  Allergen Reactions  . Atorvastatin     REACTION: muscle aches  . Simvastatin     REACTION: hand pain  . Pravachol [Pravastatin Sodium]     Muscle aching    Social History:   Social History   Socioeconomic History  . Marital status: Married    Spouse name: Not on file  . Number of children: 4  . Years of education: Not on file  . Highest education level: Not on file  Occupational History  . Occupation: installs signs    Employer: [employer]  . Financial resource strain: Not on file  . Food insecurity:    Worry: Not on file    Inability: Not on file  . Transportation needs:    Medical: Not on file    Non-medical: Not on file    Tobacco Use  . Smoking status: Former Smoker    Years: 35.00    Types: Cigarettes    Last attempt to quit: 10/27/2005    Years since quitting: 12.4  . Smokeless tobacco: Never Used  Substance and Sexual Activity  . Alcohol use: Yes    Comment: once a month-beer   . Drug use: No  . Sexual activity: Not on file  Lifestyle  . Physical activity:    Days per week: Not on file    Minutes per session: Not on file  . Stress: Not on file  Relationships  . Social connections:    Talks on phone: Not on file    Gets together: Not on file  Attends religious service: Not on file    Active member of club or organization: Not on file    Attends meetings of clubs or organizations: Not on file    Relationship status: Not on file  . Intimate partner violence:    Fear of current or ex partner: Not on file    Emotionally abused: Not on file    Physically abused: Not on file    Forced sexual activity: Not on file  Other Topics Concern  . Not on file  Social History Narrative  . Not on file    Family History:   Family History  Problem Relation Age of Onset  . Coronary artery disease Mother   . Stroke Mother   . Coronary artery disease Brother   . Cancer Brother        colon or rectal  . Colon cancer Brother 86       half-brother   . Diabetes Neg Hx   . Stomach cancer Neg Hx    Family Status:  Family Status  Relation Name Status  . Mother  Deceased at age 66       CVA, MI  . Father  Deceased at age 6       cause??  . Sister 4 Alive  . Brother  Alive  . Brother  Deceased       heart trouble  . Brother  (Not Specified)  . Brother  Alive  . Neg Hx  (Not Specified)   ROS:  Please see the history of present illness.  All other ROS reviewed and negative.     Physical Exam/Data:   Vitals:   04/12/18 0555 04/12/18 0700 04/12/18 0800  BP: (!) 150/84 106/84 137/75  Pulse: 89 75 65  Resp: 20 (!) 21 11  Temp: 97.9 F (36.6 C)    SpO2: 99% 98% 99%  Weight: 113.4 kg     Height: 5\' 10"  (1.778 m)     No intake or output data in the 24 hours ending 04/12/18 0820 Filed Weights   04/12/18 0555  Weight: 113.4 kg   Body mass index is 35.87 kg/m.   General: Well developed, well nourished, NAD Skin: Warm, dry, intact  Head: Normocephalic, atraumatic, clear, moist mucus membranes. Neck: Negative for carotid bruits. No JVD Lungs:Clear to ausculation bilaterally. No wheezes, rales, or rhonchi. Breathing is unlabored. Cardiovascular: RRR with S1 S2. No murmurs, rubs, gallops, or LV heave appreciated. Abdomen: Soft, non-tender, non-distended with normoactive bowel sounds. No obvious abdominal masses. MSK: Strength and tone appear normal for age. 5/5 in all extremities Extremities: No edema. No clubbing or cyanosis. DP/PT pulses 2+ bilaterally Neuro: Alert and oriented. No focal deficits. No facial asymmetry. MAE spontaneously. Psych: Responds to questions appropriately with normal affect.     EKG:  The EKG was personally reviewed and demonstrates: 04/12/2018 NSR with no acute ischemic changes, nonspecific T wave inversion V1 Telemetry:  Telemetry was personally reviewed and demonstrates: 04/12/2017 NSR, HR 60s to 70s  Relevant CV Studies:  ECHO: Unable to obtain  CATH: 10/29/2005:  CARDIAC CATHETERIZATION   INDICATION FOR THIS CARDIAC CATHETERIZATION:  Unstable anginal clinical  presentation.   PROCEDURES PERFORMED:  1.  Left heart cath.  2.  Coronary angiography.  3.  Left ventriculography.  4.  AngioSeal.   DESCRIPTION:  After informed consent, a 6-French sheath was placed in the  right femoral artery.  The patient understood the higher than usual risk of  bleeding since he had received  Lovenox subcu within 3 hours of the  procedure.  We felt that this was clinically indicated, however, because he  was continuing to have chest pain that was of anginal quality.   After placement of the sheath, a 6-French A2 multipurpose catheter was used   for hemodynamic recordings, left ventriculography by hand injection and  selective left and right coronary angiography.  Following coronary  angiography.  The digital images were reviewed and case was terminated.  Sheathogram was performed and AngioSeal arteriotomy closure was performed  successfully.  Hemostasis was excellent.   RESULTS:  1.  Hemodynamic data:      1.  Left ventricle pressure 111/2 mmHg.      2.  Aortic pressure 110/64.  2.  Left ventriculography:  Left ventricular cavity size and systolic      function are normal.  No regional wall motion abnormalities noted.      Ejection fraction 60%.  3.  Coronary angiography.      1.  Left main coronary:  Widely patent.      2.  Left anterior descending coronary:  Mid left anterior descending          contains a segmental somewhat hazy appearing 50-70% segmental lesion          starting just proximal to the first septal perforator in the mid          vessel.  Two large diagonals arise from the left anterior          descending.  Other regions of luminal irregularity are noted in the          left anterior descending and in the diagonals but no high-grade          lesions are noted.  The left anterior descending wraps around the          apex.  TIMI grade 3 flow was noted in the left anterior descending.      3.  Circumflex artery:  The circumflex artery gives origin to four          obtuse marginals.  The marginal vessels arise starting in the mid          lateral wall and distally.  The very first marginal that arises from          the circumflex is a dominant of the four marginals.  The other three          marginals are very small. Luminal irregularities are noted          throughout the proximal and mid circumflex and in the dominant first          marginal branch but no high-grade obstruction is present.      4.  Right coronary:  Right coronary is dominant, multiple regions of          luminal irregularity are noted in the  proximal, mid and distal          vessel.  No high-grade obstruction is seen.  The PDA and left          ventricular branch are noted.   CONCLUSION:  1.  Probable unstable angina due to ruptured/ulcerated plaque in the mid LAD      with possible thrombus present that obstructs the vessel by less than or      equal to 70%.  It is felt at this time that with good flow and with less      than significant  luminal obstruction that a more conservative approach      with medical therapy is warranted.  2.  Luminal irregularities in the right coronary artery and circumflex.  3.  Normal left ventricular function.   PLAN:  1.  Lovenox x48 hours.  2.  Aspirin and Plavix.  3.  Statin therapy.  4.  Home on Thursday a.m. if no recurrent symptoms.  5.  If recurrent symptoms, consider Cardiolite scintigraphy to look for      evidence of anterior ischemia if documented, percutaneous coronary      intervention of the mid left anterior descending.  Laboratory Data:  Chemistry Recent Labs  Lab 04/12/18 0559  NA 140  K 3.7  CL 105  CO2 24  GLUCOSE 144*  BUN 13  CREATININE 1.04  CALCIUM 9.0  GFRNONAA >60  GFRAA >60  ANIONGAP 11    Total Protein  Date Value Ref Range Status  02/13/2018 7.6 6.0 - 8.3 g/dL Final   Albumin  Date Value Ref Range Status  02/13/2018 4.3 3.5 - 5.2 g/dL Final   AST  Date Value Ref Range Status  02/13/2018 29 0 - 37 U/L Final   ALT  Date Value Ref Range Status  02/13/2018 44 0 - 53 U/L Final   Alkaline Phosphatase  Date Value Ref Range Status  02/13/2018 92 39 - 117 U/L Final   Total Bilirubin  Date Value Ref Range Status  02/13/2018 1.0 0.2 - 1.2 mg/dL Final   Hematology Recent Labs  Lab 04/12/18 0559  WBC 7.6  RBC 5.00  HGB 14.1  HCT 44.9  MCV 89.8  MCH 28.2  MCHC 31.4  RDW 13.0  PLT 358   Cardiac Enzymes Recent Labs  Lab 04/12/18 0642  TROPONINI 0.21*    Recent Labs  Lab 04/12/18 0612  TROPIPOC 0.14*    BNPNo results for  input(s): BNP, PROBNP in the last 168 hours.  DDimer No results for input(s): DDIMER in the last 168 hours. TSH:  Lab Results  Component Value Date   TSH 0.95 12/25/2012   Lipids: Lab Results  Component Value Date   CHOL 212 (H) 02/13/2018   HDL 49.80 02/13/2018   LDLCALC 146 (H) 02/13/2018   LDLDIRECT 171.0 12/25/2012   TRIG 82.0 02/13/2018   CHOLHDL 4 02/13/2018   HgbA1c: Lab Results  Component Value Date   HGBA1C 6.3 02/13/2018   Radiology/Studies:  Dg Chest 2 View  Result Date: 04/12/2018 CLINICAL DATA:  Chest pain. Rule out pneumonia, recent history of pneumonia. EXAM: CHEST - 2 VIEW COMPARISON:  Radiograph 03/30/2018 FINDINGS: Progression in diffuse peribronchial thickening from prior exam. The more vague right perihilar opacity is no longer seen. Heart size upper normal with unchanged mediastinal contours. No pneumothorax. No pleural effusion. No acute osseous abnormalities. IMPRESSION: 1. Asymmetric right lung opacity on prior exam is no longer seen. 2. New/progression of peribronchial thickening, favoring bronchitic change over pulmonary edema. Electronically Signed   By: Keith Rake M.D.   On: 04/12/2018 06:39   Assessment and Plan:   1.  NSTEMI: -Patient initially presented 04/11/2018 to the ED for foreign object (metal) in his eye.  He was treated and released from the ED.  Patient states while walking to his car with his wife he began having midsternal chest pain rated an 8/10 in severity.  He describes the pain as sharp, nonradiating with no associated symptoms.  He states that the pain was relieved after sitting in his car for approximately 10  minutes.  He proceeded home and was awoken around 5 AM this morning with similar symptoms and proceeded to the ED once again for further evaluation.  He reports for several months he has had exertional anginal symptoms, relieved with rest however disregarded them.  -Last cath 10/2005 which reported probable unstable angina due  to ruptured/ulcerated plaque in the mid LAD with possible thrombus present that obstructs the vessel.  At that time, felt that good flow with less than significant luminal obstruction, more conservative approach with medical therapy was warranted.  See above -Currently with NTG paste 0.5 inch with relief -Continue hep gtt -EKG with no acute ischemic changes, nonspecific T wave inversions in V1 -Troponin, initial i-STAT 0.14 with subsequent delta at 0.21>> continue to trend -Will plan for cardiac catheterization tomorrow 04/13/2017 for further ischemic evaluation given significant risk factors with DM 2, HTN, HLD, prior CAD and strong family history -echo pending due to continued chest pain. -Continue ASA 81, metoprolol tartrate 50 mg, no statin secondary to allergy  2.  HTN: -Stable, 137/75, 106/84 -Continue metoprolol 50 mg  3.  HLD: -Obtain lipid panel -Not on statin secondary to allergy -Consider referral to lipid clinic at discharge  4.  DM2: -Obtain hemoglobin A1c -Will hold metformin in the setting of cardiac catheterization -Start SSI for glucose control while inpatient status  For questions or updates, please contact [place] Please consult [email or web address] for contact info under Cardiology/STEMI.   SignedKathyrn Drown NP-C [facility] Pager: [phone] 04/12/2018 8:20 AM  ---------------------------------------------------------------------------------------------   History and all data above reviewed.  Patient examined.  I agree with the findings as above.  [name] is a pleasant 62 yo male with unstable angina now found to have an NSTEMI with elevated troponin and T wave abnormality in the anteroseptal leads on ECG. He had likely plaque rupture on cath from 2007, medically managed.  Constitutional: No acute distress Eyes: pupils equally round and reactive to light, sclera non-icteric, normal conjunctiva and lids ENMT: normal dentition, moist mucous  membranes Cardiovascular: regular rhythm, normal rate, no murmurs. S1 and S2 normal. Radial pulses normal bilaterally. No jugular venous distention.  Respiratory: clear to auscultation bilaterally GI : normal bowel sounds, soft and nontender. No distention.   MSK: extremities warm, well perfused. No edema.  NEURO: grossly nonfocal exam, moves all extremities. PSYCH: alert and oriented x 3, normal mood and affect.   All available labs, radiology testing, previous records reviewed. Agree with documented assessment and plan of my colleague as stated above with the following additions or changes:  Active Problems:   NSTEMI (non-ST elevated myocardial infarction) [facility])   Plan: plan for coronary angiogram +/- PCI on Monday. Recommend nitro gtt for continued chest pain. Will get an echo due to continued intermittent chest pain.  Heparin initiated. Intolerant to statin.   INFORMED CONSENT: I have reviewed the risks, indications, and alternatives to cardiac catheterization, possible angioplasty, and stenting with the patient. Risks include but are not limited to bleeding, infection, vascular injury, stroke, myocardial infection, arrhythmia, kidney injury, radiation-related injury in the case of prolonged fluoroscopy use, emergency cardiac surgery, and death. The patient understands the risks of serious complication is 1-2 in 2841 with diagnostic cardiac cath and 1-2% or less with angioplasty/stenting.    Length of Stay:  LOS: 0 days   Elouise Munroe, MD [facility] 11:44 AM  04/12/2018

## 2018-04-12 NOTE — Consult Note (Signed)
[address]       [place] [address]             [phone]          CARDIOTHORACIC SURGERY CONSULTATION REPORT  PCP is [name] Carbon, MD Referring Provider is [name] Mocha, MD Primary Cardiologist is [name] Kaiser, MD (new)  Reason for consultation:  Coronary artery disease w/ STEMI   HPI:  Patient is a 62 year old male with known history of coronary artery disease, hypertension, hyperlipidemia, type 2 diabetes mellitus, and GE reflux disease who has been referred for emergent surgical consultation for management of acute ST segment elevation myocardial infarction with coronary anatomy unfavorable for percutaneous coronary mention.  Patient's cardiac history dates back to 2007 when he was evaluated for symptoms of chest pain.  Catheterization at that time reportedly demonstrated moderate single-vessel coronary artery disease with 50 to 70% stenosis of the left anterior descending coronary artery and 40% stenosis of the left circumflex.  Medical therapy was recommended.  Over the last several months the patient has experienced substernal chest discomfort with exertion typically relieved by rest.  Several days ago the patient began to develop substernal chest pain while walking to his car.  Symptoms subsided after several minutes.  Early this morning the patient awoke with similar chest discomfort which persisted.  He presented to the emergency department where symptoms had subsided by the time of his initial evaluation.  Chest pain resolved with aspirin and nitroglycerin.  Baseline EKG revealed sinus rhythm without acute changes.  Troponin was positive, consistent with acute coronary syndrome and non-ST segment elevation myocardial infarction.  The patient was admitted to the hospital and anticoagulated using aspirin, Plavix, and intravenous heparin.  This afternoon the patient developed increased chest pain associated with marked ST segment elevation across the  anterior leads.  Code STEMI was called and the patient was brought directly to the Cath Lab by Dr. Burt [name].  Findings at the time of catheterization revealed critical 90% ostial stenosis of the left anterior descending coronary artery followed by 95% stenosis of mid left anterior descending coronary artery with what appears to be ruptured plaque.  There is insignificant disease in the left circumflex and right coronary territories.  Left ventricular systolic function appears preserved.  LVEDP was measured 19 mmHg.  [name] of ongoing chest pain while in the Cath Lab, although currently symptoms have subsided with intravenous nitroglycerin and high-dose heparin.  The patient is married and lives locally in [place] with his wife and 1 of his sons.  He still works Stage manager signs.  He states that he is reasonably active physically and he reports no significant physical limitations prior to his recent illness.  He does note that approximately 2 or 3 weeks ago he was treated for "walking pneumonia".  He denies any shortness of breath presently.  He denies any history of exertional shortness of breath, PND, orthopnea, or lower extremity edema.  Past Medical History:  Diagnosis Date  . CAD (coronary artery disease) 7/07   Cath for unstable angina--- 50-70% LAD, 40% circ  . ED (erectile dysfunction)   . GERD (gastroesophageal reflux disease)   . Hx of colonic polyps   . Hyperlipidemia   . Hypertension   . Impaired fasting glucose   . Sleep disorder   . Wrist fracture     6 foot fall onto head, concussion, L wrist (comminuted with intra-articular extension) and 2nd finger (middle  phalanx) fxs, vertigo    Past Surgical History:  Procedure Laterality Date  . BRANCHIAL CLEFT CYST EXCISION    . COLONOSCOPY  04/03/2007  . HEMORRHOID SURGERY    . RHINOPLASTY      Family History  Problem Relation Age of Onset  . Coronary artery disease Mother   . Stroke  Mother   . Coronary artery disease Brother   . Cancer Brother        colon or rectal  . Colon cancer Brother 75       half-brother   . Diabetes Neg Hx   . Stomach cancer Neg Hx     Social History   Socioeconomic History  . Marital status: Married    Spouse name: Not on file  . Number of children: 4  . Years of education: Not on file  . Highest education level: Not on file  Occupational History  . Occupation: installs signs    Employer: [employer]  . Financial resource strain: Not on file  . Food insecurity:    Worry: Not on file    Inability: Not on file  . Transportation needs:    Medical: Not on file    Non-medical: Not on file  Tobacco Use  . Smoking status: Former Smoker    Years: 35.00    Types: Cigarettes    Last attempt to quit: 10/27/2005    Years since quitting: 12.4  . Smokeless tobacco: Never Used  Substance and Sexual Activity  . Alcohol use: Yes    Comment: once a month-beer   . Drug use: No  . Sexual activity: Not on file  Lifestyle  . Physical activity:    Days per week: Not on file    Minutes per session: Not on file  . Stress: Not on file  Relationships  . Social connections:    Talks on phone: Not on file    Gets together: Not on file    Attends religious service: Not on file    Active member of club or organization: Not on file    Attends meetings of clubs or organizations: Not on file    Relationship status: Not on file  . Intimate partner violence:    Fear of current or ex partner: Not on file    Emotionally abused: Not on file    Physically abused: Not on file    Forced sexual activity: Not on file  Other Topics Concern  . Not on file  Social History Narrative  . Not on file    Prior to Admission medications   Medication Sig Start Date End Date Taking? Authorizing Provider  aspirin EC 81 MG tablet Take 81 mg by mouth daily.   Yes [provider]  metFORMIN (GLUCOPHAGE-XR) 500 MG 24 hr tablet Take 1  tablet (500 mg total) by mouth daily with breakfast. 02/13/18  Yes [name] Carbon, MD  metoprolol tartrate (LOPRESSOR) 50 MG tablet TAKE 1 TABLET BY MOUTH DAILY Patient taking differently: Take 50 mg by mouth 2 (two) times daily.  04/06/18  Yes [name] Carbon, MD  Multiple Vitamin (MULTIVITAMIN) tablet Take 1 tablet by mouth daily.   Yes [provider]  nitroGLYCERIN (NITROSTAT) 0.4 MG SL tablet DISSOLVE 1 TABLET UNDER THE TONGUE FOR CHEST PAIN. MAY REPEAT EVERY 5MINUTES UP TO 3 DOSES. IF NO RELIEF, CALL 911** 01/21/17  Yes [name] Carbon, MD  Omega-3 Fatty Acids (OMEGA-3 FISH OIL PO) Take 1,000 mg by mouth  daily.   Yes [provider]  omeprazole (PRILOSEC) 20 MG capsule TAKE ONE CAPSULE BY MOUTH DAILY Patient taking differently: Take 20 mg by mouth daily.  02/19/18  Yes [name] Carbon, MD  azithromycin (ZITHROMAX Z-PAK) 250 MG tablet Take 2 tablets (500 mg) on  Day 1,  followed by 1 tablet (250 mg) once daily on Days 2 through 5. Patient not taking: Reported on 04/12/2018 03/30/18   [name] Carbon, MD  benzonatate (TESSALON) 100 MG capsule Take 1 capsule (100 mg total) by mouth every 8 (eight) hours. Patient not taking: Reported on 04/12/2018 03/27/18   Loura Halt A, NP  diclofenac (VOLTAREN) 75 MG EC tablet Take 1 tablet (75 mg total) by mouth 2 (two) times daily. Patient not taking: Reported on 04/12/2018 08/18/17   [name] Carbon, MD  fluticasone [facility]) 50 MCG/ACT nasal spray USE TWO SPRAY IN [facility] NOSTRIL DAILY Patient not taking: Reported on 04/12/2018 02/13/18   [name] Carbon, MD  HYDROcodone-homatropine [facility]) 5-1.5 MG/5ML syrup Take 5 mLs by mouth at bedtime as needed for cough. Patient not taking: Reported on 04/12/2018 03/30/18   [name] Carbon, MD  traZODone (DESYREL) 100 MG tablet Take 1-2 tablets (100-200 mg total) by mouth at bedtime as needed for sleep. Patient not taking: Reported on 04/12/2018 09/06/15   [name] Carbon, MD     Current Facility-Administered Medications  Medication Dose Route Frequency Provider Last Rate Last Dose  . 0.9 %  sodium chloride infusion  250 mL Intravenous PRN Kathyrn Drown D, NP      . 0.9 %  sodium chloride infusion    Continuous PRN [name] Mocha, MD 50 mL/hr at 04/12/18 1810 50 mL/hr at 04/12/18 1810  . [START ON 04/13/2018] 0.9% sodium chloride infusion  3 mL/kg/hr Intravenous Continuous Tommie Raymond, NP       Followed by  . [START ON 04/13/2018] 0.9% sodium chloride infusion  1 mL/kg/hr Intravenous Continuous Kathyrn Drown D, NP      . Doug Sou Hold] acetaminophen (TYLENOL) tablet 650 mg  650 mg Oral Q4H PRN Tommie Raymond, NP      . Derrill Memo ON 04/13/2018] aspirin chewable tablet 81 mg  81 mg Oral Pre-Cath Kathyrn Drown D, NP      . Doug Sou Hold] aspirin EC tablet 81 mg  81 mg Oral Daily Kathyrn Drown D, NP      . cefUROXime (ZINACEF) 1.5 g in sodium chloride 0.9 % 100 mL IVPB  1.5 g Intravenous To OR Einar Grad, RPH      . cefUROXime (ZINACEF) 750 mg in sodium chloride 0.9 % 100 mL IVPB  750 mg Intravenous To OR Einar Grad, RPH      . [START ON 04/13/2018] cefUROXime (ZINACEF) 750 mg in sodium chloride 0.9 % 100 mL IVPB  750 mg Intravenous To OR Einar Grad, RPH      . dexmedetomidine (PRECEDEX) 400 MCG/100ML (4 mcg/mL) infusion  0.1-0.7 mcg/kg/hr Intravenous To OR Einar Grad, RPH      . DOPamine (INTROPIN) 800 mg in dextrose 5 % 250 mL (3.2 mg/mL) infusion  0-10 mcg/kg/min Intravenous To OR Einar Grad, RPH      . EPINEPHrine (ADRENALIN) 4 mg in dextrose 5 % 250 mL (0.016 mg/mL) infusion  0-10 mcg/min Intravenous To OR Einar Grad, RPH      . fentaNYL (SUBLIMAZE) injection    PRN [name] Mocha, MD   25 mcg at 04/12/18 1812  .  heparin 2,500 Units, papaverine 30 mg in electrolyte-148 (PLASMALYTE-148) 500 mL irrigation   Irrigation To OR Einar Grad, RPH      . heparin 30,000 units/NS 1000 mL solution for CELLSAVER   Other To  OR Einar Grad, [facility]      . heparin ADULT infusion 100 units/mL (25000 units/262mL sodium chloride 0.45%)  1,450 Units/hr Intravenous Continuous Einar Grad, RPH 14.5 mL/hr at 04/12/18 1648 1,450 Units/hr at 04/12/18 1648  . heparin injection    PRN [name] Mocha, MD   8,000 Units at 04/12/18 1818  . [MAR Hold] insulin aspart (novoLOG) injection 0-15 Units  0-15 Units Subcutaneous TID WC Kathyrn Drown D, NP      . insulin regular, human (MYXREDLIN) 100 units/ 100 mL infusion   Intravenous To OR Einar Grad, RPH      . lidocaine (PF) (XYLOCAINE) 1 % injection    PRN [name] Mocha, MD   5 mL at 04/12/18 1813  . magnesium sulfate (IV Push/IM) injection 40 mEq  40 mEq Other To OR Einar Grad, [facility]      . [MAR Hold] metoprolol tartrate (LOPRESSOR) tablet 50 mg  50 mg Oral BID Kathyrn Drown D, NP   50 mg at 04/12/18 1020  . midazolam (VERSED) injection    PRN [name] Mocha, MD   1 mg at 04/12/18 1812  . milrinone (PRIMACOR) 20 MG/100 ML (0.2 mg/mL) infusion  0.3 mcg/kg/min Intravenous To OR Einar Grad, RPH      . morphine 2 MG/ML injection           . [MAR Hold] nitroGLYCERIN (NITROGLYN) 2 % ointment 0.5 inch  0.5 inch Topical Q6H Kathyrn Drown D, NP   0.5 inch at 04/12/18 1153  . [MAR Hold] nitroGLYCERIN (NITROSTAT) SL tablet 0.4 mg  0.4 mg Sublingual Q5 min PRN Kathyrn Drown D, NP   0.4 mg at 04/12/18 1725  . nitroGLYCERIN 50 mg in dextrose 5 % 250 mL (0.2 mg/mL) infusion  2-200 mcg/min Intravenous To OR Einar Grad, RPH      . [MAR Hold] ondansetron (ZOFRAN) injection 4 mg  4 mg Intravenous Q6H PRN Kathyrn Drown D, NP      . phenylephrine (NEOSYNEPHRINE) 20-0.9 MG/250ML-% infusion  30-200 mcg/min Intravenous To OR Einar Grad, RPH      . potassium chloride injection 80 mEq  80 mEq Other To OR Einar Grad, [facility]      . Radial Cocktail/Verapamil only    PRN [name] Mocha, MD   10 mL at 04/12/18 1813  . sodium chloride flush (NS) 0.9 %  injection 3 mL  3 mL Intravenous Q12H Kathyrn Drown D, NP      . sodium chloride flush (NS) 0.9 % injection 3 mL  3 mL Intravenous PRN Kathyrn Drown D, NP      . tranexamic acid (CYKLOKAPRON) 2,500 mg in sodium chloride 0.9 % 250 mL (10 mg/mL) infusion  1.5 mg/kg/hr Intravenous To OR Einar Grad, RPH      . tranexamic acid (CYKLOKAPRON) bolus via infusion - over 30 minutes 1,675.5 mg  15 mg/kg Intravenous To OR Einar Grad, RPH      . tranexamic acid (CYKLOKAPRON) pump prime solution 223 mg  2 mg/kg Intracatheter To OR Einar Grad, RPH      . vancomycin (VANCOCIN) 1,000 mg in sodium chloride 0.9 % 1,000 mL irrigation   Irrigation To OR Einar Grad, RPH      .  vancomycin (VANCOCIN) 1,500 mg in sodium chloride 0.9 % 250 mL IVPB  1,500 mg Intravenous To OR Einar Grad, [facility]        Allergies  Allergen Reactions  . Atorvastatin     REACTION: muscle aches  . Simvastatin     REACTION: hand pain  . Pravachol [Pravastatin Sodium]     Muscle aching      Review of Systems:  Per HPI - remainder non-contributory     Physical Exam:   BP 131/78   Pulse 77   Temp 98.1 F (36.7 C) (Oral)   Resp 17   Ht 5\' 10"  (1.778 m)   Wt 111.7 kg   SpO2 100%   BMI 35.33 kg/m   General:  WDWN male NAD on cath lab table  HEENT:  Unremarkable   Neck:   no JVD, no bruits, no adenopathy   Chest:   clear to auscultation, symmetrical breath sounds, no wheezes, no rhonchi   CV:   RRR, no murmur   Abdomen:  soft, non-tender, no masses   Extremities:  warm, well-perfused, pulses palpable, no lower extremity edema  Rectal/GU  Deferred  Neuro:   Grossly non-focal and symmetrical throughout  Skin:   Clean and dry, no rashes, no breakdown  Diagnostic Tests:  Lab Results: Recent Labs    04/12/18 0559  WBC 7.6  HGB 14.1  HCT 44.9  PLT 358   BMET:  Recent Labs    04/12/18 0559 04/12/18 1030  NA 140 140  K 3.7 3.9  CL 105 104  CO2 24 22  GLUCOSE 144* 116*  BUN  13 12  CREATININE 1.04 0.92  CALCIUM 9.0 9.0    CBG (last 3)  Recent Labs    04/12/18 1259  GLUCAP 109*   PT/INR:  No results for input(s): LABPROT, INR in the last 72 hours.  CXR:  CHEST - 2 VIEW  COMPARISON:  Radiograph 03/30/2018  FINDINGS: Progression in diffuse peribronchial thickening from prior exam. The more vague right perihilar opacity is no longer seen. Heart size upper normal with unchanged mediastinal contours. No pneumothorax. No pleural effusion. No acute osseous abnormalities.  IMPRESSION: 1. Asymmetric right lung opacity on prior exam is no longer seen. 2. New/progression of peribronchial thickening, favoring bronchitic change over pulmonary edema.   Electronically Signed   By: Keith Rake M.D.   On: 04/12/2018 06:39  Transthoracic Echocardiography  Patient:    [name], [name] MR #:       [id number] Study Date: 04/12/2018 Gender:     M Age:        61 Height:     177.8 cm Weight:     113.4 kg BSA:        2.41 m^2 Pt. Status: Room:       [place], [place] [id number]  PERFORMING   [facility], Inpatient  SONOGRAPHER  Mikki Santee  ORDERING     Kathyrn Drown D  REFERRING    Mcdaniel, Alphonsa Overall  ADMITTING    [name]  cc:  ------------------------------------------------------------------- LV EF: 55% -   60%  ------------------------------------------------------------------- Indications:      Chest pain 786.51.  ------------------------------------------------------------------- History:   PMH:   Coronary artery disease.  Risk factors: Hypertension. Dyslipidemia.  ------------------------------------------------------------------- Study Conclusions  - Left ventricle: The cavity size was normal. Wall thickness was   normal. Systolic function was normal. The estimated ejection   fraction was in the range of 55%  to 60%. Diffuse hypokinesis.   Doppler parameters are consistent with abnormal left  ventricular   relaxation (grade 1 diastolic dysfunction).  ------------------------------------------------------------------- Study data:  No prior study was available for comparison.  Study status:  Routine.  Procedure:  The patient reported no pain pre or post test. Transthoracic echocardiography. Image quality was adequate.  Study completion:  There were no complications. Transthoracic echocardiography.  M-mode, complete 2D, spectral Doppler, and color Doppler.  Birthdate:  Patient birthdate: [birth year].  Age:  Patient is 62 yr old.  Sex:  Gender: male. BMI: 35.9 kg/m^2.  Blood pressure:     127/75  Patient status: Inpatient.  Study date:  Study date: 04/12/2018. Study time: 01:51 PM.  Location:  Echo laboratory.  -------------------------------------------------------------------  ------------------------------------------------------------------- Left ventricle:  The cavity size was normal. Wall thickness was normal. Systolic function was normal. The estimated ejection fraction was in the range of 55% to 60%. Diffuse hypokinesis. Doppler parameters are consistent with abnormal left ventricular relaxation (grade 1 diastolic dysfunction).  ------------------------------------------------------------------- Aortic valve:   Trileaflet; normal thickness leaflets. Mobility was not restricted.  Doppler:  Transvalvular velocity was within the normal range. There was no stenosis. There was no regurgitation.   ------------------------------------------------------------------- Aorta:  Aortic root: The aortic root was normal in size.  ------------------------------------------------------------------- Mitral valve:   Structurally normal valve.   Mobility was not restricted.  Doppler:  Transvalvular velocity was within the normal range. There was no evidence for stenosis. There was no regurgitation.    Peak gradient (D): 2 mm  Hg.  ------------------------------------------------------------------- Left atrium:  The atrium was normal in size.  ------------------------------------------------------------------- Right ventricle:  The cavity size was normal. Wall thickness was normal. Systolic function was normal.  ------------------------------------------------------------------- Pulmonic valve:    Structurally normal valve.   Cusp separation was normal.  Doppler:  Transvalvular velocity was within the normal range. There was no evidence for stenosis. There was no regurgitation.  ------------------------------------------------------------------- Tricuspid valve:   Structurally normal valve.    Doppler: Transvalvular velocity was within the normal range. There was trivial regurgitation.  ------------------------------------------------------------------- Pulmonary artery:   The main pulmonary artery was normal-sized. Systolic pressure was within the normal range.  ------------------------------------------------------------------- Right atrium:  The atrium was normal in size.  ------------------------------------------------------------------- Pericardium:  There was no pericardial effusion.  ------------------------------------------------------------------- Systemic veins: Inferior vena cava: The vessel was normal in size.  ------------------------------------------------------------------- Measurements   Left ventricle                         Value        Reference  LV ID, ED, PLAX chordal                47    mm     43 - 52  LV ID, ES, PLAX chordal                31    mm     23 - 38  LV fx shortening, PLAX chordal         34    %      >=29  LV PW thickness, ED                    11    mm     ----------  IVS/LV PW ratio, ED                    1            <=  1.3  Stroke volume, 2D                      79    ml     ----------  Stroke volume/bsa, 2D                  33    ml/m^2  ----------  LV e&', lateral                         7.4   cm/s   ----------  LV E/e&', lateral                       9.91         ----------  LV e&', medial                          7.72  cm/s   ----------  LV E/e&', medial                        9.49         ----------  LV e&', average                         7.56  cm/s   ----------  LV E/e&', average                       9.7          ----------    Ventricular septum                     Value        Reference  IVS thickness, ED                      11    mm     ----------    LVOT                                   Value        Reference  LVOT ID, S                             22    mm     ----------  LVOT area                              3.8   cm^2   ----------  LVOT peak velocity, S                  80.3  cm/s   ----------  LVOT mean velocity, S                  56.6  cm/s   ----------  LVOT VTI, S                            20.8  cm     ----------    Aorta  Value        Reference  Aortic root ID, ED                     33    mm     ----------    Left atrium                            Value        Reference  LA ID, A-P, ES                         35    mm     ----------  LA ID/bsa, A-P                         1.45  cm/m^2 <=2.2  LA volume, S                           41.5  ml     ----------  LA volume/bsa, S                       17.2  ml/m^2 ----------  LA volume, ES, 1-p A4C                 32.5  ml     ----------  LA volume/bsa, ES, 1-p A4C             13.5  ml/m^2 ----------  LA volume, ES, 1-p A2C                 47.7  ml     ----------  LA volume/bsa, ES, 1-p A2C             19.8  ml/m^2 ----------    Mitral valve                           Value        Reference  Mitral E-wave peak velocity            73.3  cm/s   ----------  Mitral A-wave peak velocity            72.4  cm/s   ----------  Mitral deceleration time       (H)     289   ms     150 - 230  Mitral peak gradient, D                2      mm Hg  ----------  Mitral E/A ratio, peak                 1            ----------    Pulmonary arteries                     Value        Reference  PA pressure, S, DP                     19    mm Hg  <=30    Tricuspid valve                        Value  Reference  Tricuspid regurg peak velocity         197   cm/s   ----------  Tricuspid peak RV-RA gradient          16    mm Hg  ----------    Right atrium                           Value        Reference  RA ID, S-I, ES, A4C            (H)     55.8  mm     34 - 49  RA area, ES, A4C                       16    cm^2   8.3 - 19.5  RA volume, ES, A/L                     38.6  ml     ----------  RA volume/bsa, ES, A/L                 16    ml/m^2 ----------    Systemic veins                         Value        Reference  Estimated CVP                          3     mm Hg  ----------    Right ventricle                        Value        Reference  TAPSE                                  30.1  mm     ----------  RV pressure, S, DP                     19    mm Hg  <=30  RV s&', lateral, S                      11.4  cm/s   ----------  Legend: (L)  and  (H)  mark values outside specified reference range.  ------------------------------------------------------------------- Prepared and Electronically Authenticated by  Candee Furbish, M.D. 2019-12-15T15:28:17   Coronary/Graft Acute MI Revascularization  LEFT HEART CATH AND CORONARY ANGIOGRAPHY  Conclusion     Dist LM to Ost LAD lesion is 90% stenosed.  Prox LAD to Mid LAD lesion is 95% stenosed.  There is mild left ventricular systolic dysfunction.  LV end diastolic pressure is normal.  The left ventricular ejection fraction is 50-55% by visual estimate.   1.  Severe single-vessel coronary artery disease involving the ostium and the midportion of the LAD which correlates with the patient's acute injury current on EKG 2.  Patent left mainstem, left circumflex, and RCA  with mild nonobstructive stenosis 3.  Mild contraction abnormality of the LV apex with preserved overall LVEF estimated at 50 to 55%  Recommendations: With ostial LAD involvement, recommend emergency coronary bypass grafting.  Cardiac surgical consultation is placed.  Recommendations   Antiplatelet/Anticoag Secondary to high risk anatomy involving the true ostium of the LAD the patient is referred for emergency cardiac surgical consultation  Indications   ST elevation myocardial infarction involving left anterior descending (LAD) coronary artery ([place]) [I21.02 (ICD-10-CM)]  Procedural Details   Technical Details INDICATION: Anterior STEMI. 62 year old male initially presented with non-ST elevation infarction. This afternoon his chest pain intensified and an EKG demonstrates marked anterior ST segment elevation. A code STEMI is called and he is brought emergently to the cardiac catheterization lab. It is noted that about 10 years ago he had ACS and underwent cardiac catheterization demonstrating a ruptured plaque in the mid LAD that was felt to be moderate and nonobstructive. He was managed medically at that time. His comorbid medical conditions include hypertension, mixed hyperlipidemia, and type 2 diabetes.  PROCEDURAL DETAILS: The right wrist was prepped, draped, and anesthetized with 1% lidocaine. Using the modified Seldinger technique, a 5/6 French Slender sheath was introduced into the right radial artery. 3 mg of verapamil was administered through the sheath, weight-based unfractionated heparin was administered intravenously. Standard Judkins catheters were used for selective coronary angiography and left ventriculography. Catheter exchanges were performed over an exchange length guidewire. There were no immediate procedural complications. A TR band was used for radial hemostasis at the completion of the procedure. The patient was transferred to the post catheterization recovery area for  further monitoring.    Estimated blood loss <50 mL.   During this procedure medications were administered to achieve and maintain moderate conscious sedation while the patient's heart rate, blood pressure, and oxygen saturation were continuously monitored and I was present face-to-face 100% of this time.  Medications  (Filter: Administrations occurring from 04/12/18 1800 to 04/12/18 1916)  Medication Rate/Dose/Volume Action  Date Time   0.9 % sodium chloride infusion (mL/hr) 50 mL/hr New Bag/Given 04/12/18 1810   Dosing weight:  111.7 kg        Total dose as of 04/12/18 1922        Cannot be calculated        midazolam (VERSED) injection (mg) 1 mg Given 04/12/18 1812   Total dose as of 04/12/18 1922        1 mg        fentaNYL (SUBLIMAZE) injection (mcg) 25 mcg Given 04/12/18 1812   Total dose as of 04/12/18 1922        25 mcg        lidocaine (PF) (XYLOCAINE) 1 % injection (mL) 5 mL Given 04/12/18 1813   Total dose as of 04/12/18 1922        5 mL        Radial Cocktail/Verapamil only (mL) 10 mL Given 04/12/18 1813   Total dose as of 04/12/18 1922        10 mL        heparin injection (Units) 8,000 Units Given 04/12/18 1818   Total dose as of 04/12/18 1922        8,000 Units        iohexol (OMNIPAQUE) 350 MG/ML injection (mL) 75 mL Given 04/12/18 1903   Total dose as of 04/12/18 1922        75 mL        Heparin (Porcine) in NaCl 1000-0.9 UT/500ML-% SOLN (mL) 500 mL Given 04/12/18 1903   Total dose as of 04/12/18 1922 500 mL Given 1903   1,000 mL        acetaminophen (TYLENOL) tablet 650 mg (  mg) *Not included in total [facility] Hold 04/12/18 1800   Dosing weight:  113.4 kg        Total dose as of 04/12/18 1922        Cannot be calculated        aspirin EC tablet 81 mg (mg) *Not included in total MAR Hold 04/12/18 1800   Dosing weight:  113.4 kg        Total dose as of 04/12/18 1922        Cannot be calculated        insulin aspart (novoLOG) injection 0-15 Units (Units) *Not  included in total [facility] Hold 04/12/18 1800   Dosing weight:  113.4 kg        Total dose as of 04/12/18 1922        Cannot be calculated        metoprolol tartrate (LOPRESSOR) tablet 50 mg (mg) *Not included in total MAR Hold 04/12/18 1800   Total dose as of 04/12/18 1922        Cannot be calculated        nitroGLYCERIN (NITROGLYN) 2 % ointment 0.5 inch (inch) *Not included in total MAR Hold 04/12/18 1800   Dosing weight:  113.4 kg        Total dose as of 04/12/18 1922        Cannot be calculated        nitroGLYCERIN (NITROSTAT) SL tablet 0.4 mg (mg) *Not included in total MAR Hold 04/12/18 1800   Total dose as of 04/12/18 1922        Cannot be calculated        ondansetron (ZOFRAN) injection 4 mg (mg) *Not included in total MAR Hold 04/12/18 1800   Dosing weight:  113.4 kg        Total dose as of 04/12/18 1922        Cannot be calculated        Sedation Time   Sedation Time Physician-1: 22 minutes 32 seconds  Coronary Findings   Diagnostic  Dominance: Right  Left Main  Dist LM to Ost LAD lesion 90% stenosed  Dist LM to Ost LAD lesion is 90% stenosed. The lesion is eccentric.  Left Anterior Descending  Prox LAD to Mid LAD lesion 95% stenosed  Prox LAD to Mid LAD lesion is 95% stenosed. The lesion is eccentric and thrombotic.  Left Circumflex  Vessel is large. The vessel exhibits minimal luminal irregularities.  Right Coronary Artery  There is mild diffuse disease throughout the vessel.  Intervention   No interventions have been documented.  Wall Motion   Resting               Left Heart   Left Ventricle The left ventricular size is normal. There is mild left ventricular systolic dysfunction. LV end diastolic pressure is normal. The left ventricular ejection fraction is 50-55% by visual estimate. There are LV function abnormalities due to segmental dysfunction.  Coronary Diagrams   Diagnostic  Dominance: Right    Intervention   Implants    No implant  documentation for this case.  MERGE Images   Show images for CARDIAC CATHETERIZATION   Link to Procedure Log   Procedure Log    Hemo Data    Most Recent Value  AO Systolic Pressure 341 mmHg  AO Diastolic Pressure 57 mmHg  AO Mean 76 mmHg  LV Systolic Pressure 937 mmHg  LV Diastolic Pressure 11 mmHg  LV EDP 19 mmHg  AOp Systolic Pressure 111 mmHg  AOp Diastolic Pressure 61 mmHg  AOp Mean Pressure 82 mmHg  LVp Systolic Pressure 735 mmHg  LVp Diastolic Pressure 14 mmHg  LVp EDP Pressure 28 mmHg       Impression:  Patient has single-vessel coronary artery disease with critical 90% ostial stenosis of the left anterior descending coronary artery and 95% subtotal occlusion of the mid left anterior descending coronary artery with ruptured acute plaque.  The patient has developed worsening chest pain with ST segment elevation across the anterior precordial leads despite intravenous heparin.  Coronary anatomy appears unfavorable for percutaneous core intervention.  I agree the patient would best be treated with emergent surgical revascularization.   Plan:  I have discussed the indications, risk, and potential benefits of emergency coronary artery bypass grafting with the patient in the Cath Lab this evening.  The rationale for proceeding directly to surgery was discussed as were concerns regarding risks related to an attempted percutaneous core intervention.  The patient understands and accepts all potential associated risks of surgery including but not limited to risk of death, stroke or other neurologic complication, myocardial infarction, congestive heart failure, respiratory failure, renal failure, bleeding requiring blood transfusion and/or reexploration, aortic dissection or other major vascular complication, arrhythmia, heart block or bradycardia requiring permanent pacemaker, pneumonia, pleural effusion, wound infection, pulmonary embolus or other thromboembolic complication,  chronic pain or other delayed complications related to median sternotomy, or the late recurrence of symptomatic ischemic heart disease and/or congestive heart failure.  The importance of long term risk modification have been emphasized.  All questions answered.  We plan to proceed directly to the operating room.   I spent in excess of 60 minutes during the conduct of this hospital consultation and >50% of this time involved direct face-to-face encounter for counseling and/or coordination of the patient's care.    Valentina Gu. Roxy Manns, MD 04/12/2018 7:01 PM

## 2018-04-12 NOTE — Anesthesia Procedure Notes (Signed)
Procedure Name: Intubation Date/Time: 04/12/2018 7:32 PM Performed by: Babs Bertin, CRNA Pre-anesthesia Checklist: Patient identified, Emergency Drugs available, Suction available and Patient being monitored Patient Re-evaluated:Patient Re-evaluated prior to induction Oxygen Delivery Method: Circle System Utilized Preoxygenation: Pre-oxygenation with 100% oxygen Induction Type: IV induction Laryngoscope Size: Mac Grade View: Grade II Tube type: Subglottic suction tube Tube size: 8.0 mm Number of attempts: 1 Airway Equipment and Method: Stylet and Oral airway Placement Confirmation: ETT inserted through vocal cords under direct vision,  positive ETCO2 and breath sounds checked- equal and bilateral Secured at: 22 cm Tube secured with: Tape Dental Injury: Teeth and Oropharynx as per pre-operative assessment

## 2018-04-12 NOTE — Transfer of Care (Signed)
Immediate Anesthesia Transfer of Care Note  Patient: [name]  Procedure(s) Performed: CORONARY ARTERY BYPASS GRAFTING (CABG) x two , using left internal mammary artery and right leg greater saphenous vein harvested endoscopically (N/A Chest) TRANSESOPHAGEAL ECHOCARDIOGRAM (TEE) (N/A )  Patient Location: SICU  Anesthesia Type:General  Level of Consciousness: Patient remains intubated per anesthesia plan  Airway & Oxygen Therapy: Patient remains intubated per anesthesia plan and Patient placed on Ventilator (see vital sign flow sheet for setting)  Post-op Assessment: Report given to RN and Post -op Vital signs reviewed and stable  Post vital signs: Reviewed and stable  Last Vitals:  Vitals Value Taken Time  BP    Temp 36.1 C 04/12/2018 11:17 PM  Pulse 79 04/12/2018 11:17 PM  Resp 12 04/12/2018 11:17 PM  SpO2 99 % 04/12/2018 11:17 PM  Vitals shown include unvalidated device data.  Last Pain:  Vitals:   04/12/18 1904  TempSrc:   PainSc: 2       Patients Stated Pain Goal: 2 (42/68/34 1962)  Complications: No apparent anesthesia complications

## 2018-04-12 NOTE — Progress Notes (Signed)
ANTICOAGULATION CONSULT NOTE - Initial Consult  Pharmacy Consult for heparin Indication: chest pain/ACS  Allergies  Allergen Reactions  . Atorvastatin     REACTION: muscle aches  . Simvastatin     REACTION: hand pain  . Pravachol [Pravastatin Sodium]     Muscle aching    Patient Measurements: Height: 5\' 10"  (177.8 cm) Weight: 250 lb (113.4 kg) IBW/kg (Calculated) : 73 Heparin Dosing Weight: 100kg  Vital Signs: Temp: 97.9 F (36.6 C) (12/15 0555) Temp Source: Oral (12/14 2220) BP: 150/84 (12/15 0555) Pulse Rate: 89 (12/15 0555)  Labs: Recent Labs    04/12/18 0559  HGB 14.1  HCT 44.9  PLT 358  CREATININE 1.04    Estimated Creatinine Clearance: 91.7 mL/min (by C-G formula based on SCr of 1.04 mg/dL).   Medical History: Past Medical History:  Diagnosis Date  . CAD (coronary artery disease) 7/07   Cath for unstable angina--- 50-70% LAD, 40% circ  . ED (erectile dysfunction)   . GERD (gastroesophageal reflux disease)   . Hx of colonic polyps   . Hyperlipidemia   . Hypertension   . Impaired fasting glucose   . Sleep disorder   . Wrist fracture     6 foot fall onto head, concussion, L wrist (comminuted with intra-articular extension) and 2nd finger (middle phalanx) fxs, vertigo    Assessment: 62yo male c/o waxing and waning CP since 11p, worsened at 5a and woke him from sleep, initial istat troponin elevated, to begin heparin.  Goal of Therapy:  Heparin level 0.3-0.7 units/ml Monitor platelets by anticoagulation protocol: Yes   Plan:  Will given heparin 4000 units x1 followed by gtt at 1400 units/hr and monitor heparin levels and CBC.  Wynona Neat, PharmD, BCPS  04/12/2018,6:50 AM

## 2018-04-12 NOTE — Progress Notes (Signed)
ANTICOAGULATION CONSULT NOTE - [place] for heparin Indication: chest pain/ACS  Allergies  Allergen Reactions  . Atorvastatin     REACTION: muscle aches  . Simvastatin     REACTION: hand pain  . Pravachol [Pravastatin Sodium]     Muscle aching    Patient Measurements: Height: 5\' 10"  (177.8 cm) Weight: 246 lb 4.1 oz (111.7 kg) IBW/kg (Calculated) : 73 Heparin Dosing Weight: 100kg  Vital Signs: Temp: 97.7 F (36.5 C) (12/15 1220) Temp Source: Oral (12/15 1220) BP: 127/75 (12/15 1220) Pulse Rate: 64 (12/15 1220)  Labs: Recent Labs    04/12/18 0559 04/12/18 0642 04/12/18 1030 04/12/18 1512  HGB 14.1  --   --   --   HCT 44.9  --   --   --   PLT 358  --   --   --   HEPARINUNFRC  --   --   --  0.36  CREATININE 1.04  --  0.92  --   TROPONINI  --  0.21* 0.77*  --     Estimated Creatinine Clearance: 102.9 mL/min (by C-G formula based on SCr of 0.92 mg/dL).   Medical History: Past Medical History:  Diagnosis Date  . CAD (coronary artery disease) 7/07   Cath for unstable angina--- 50-70% LAD, 40% circ  . ED (erectile dysfunction)   . GERD (gastroesophageal reflux disease)   . Hx of colonic polyps   . Hyperlipidemia   . Hypertension   . Impaired fasting glucose   . Sleep disorder   . Wrist fracture     6 foot fall onto head, concussion, L wrist (comminuted with intra-articular extension) and 2nd finger (middle phalanx) fxs, vertigo    Assessment: 62yo male c/o waxing and waning CP since 11p, worsened at 5a and woke him from sleep, initial istat troponin elevated, to begin heparin.  Heparin level therapeutic, will increase slightly as bolus may be affecting level.  Goal of Therapy:  Heparin level 0.3-0.7 units/ml Monitor platelets by anticoagulation protocol: Yes   Plan:  Increase heparin to 1450 units/hr Daily heparin level and CBC  Arrie Senate, PharmD, BCPS Clinical Pharmacist Please check AMION for all [facility]  numbers 04/12/2018

## 2018-04-12 NOTE — ED Notes (Signed)
Ordered pt lunch tray 

## 2018-04-12 NOTE — ED Notes (Signed)
Heparin verified with Darci Current RN.

## 2018-04-12 NOTE — Progress Notes (Signed)
  Echocardiogram Echocardiogram Transesophageal has been performed.  [name] 04/12/2018, 8:11 PM

## 2018-04-12 NOTE — ED Provider Notes (Signed)
[facility] EMERGENCY DEPARTMENT Provider Note   CSN: [id number] Arrival date & time: 04/12/18  2355     History   Chief Complaint Chief Complaint  Patient presents with  . Chest Pain    HPI [name] is a 62 y.o. male.  HPI   62 year old male, with PMH of HTN, HLD, diabetes, presents with chest pain.  Pain started last night approximately 11 PM when he was walking to his truck.  He describes pain as a pressure in the midsternal region occasionally radiating to his left arm.  He notes associated shortness of breath.  Since last night his pain is been waxing and waning.  Pain worsened at 5 AM and woke him from sleep.  He called EMS and was given 324 of aspirin and 1 nitro.  The nitro helped with his pain.  He denies any associated nausea, vomiting, diaphoresis.  He notes a cardiac cath approximately 10 years ago which showed CAD but did not require any stents.  He denies a PMH of MI.  He notes a family history of mother, father and brother who all have had MIs.   Past Medical History:  Diagnosis Date  . CAD (coronary artery disease) 7/07   Cath for unstable angina--- 50-70% LAD, 40% circ  . ED (erectile dysfunction)   . GERD (gastroesophageal reflux disease)   . Hx of colonic polyps   . Hyperlipidemia   . Hypertension   . Impaired fasting glucose   . Sleep disorder   . Wrist fracture     6 foot fall onto head, concussion, L wrist (comminuted with intra-articular extension) and 2nd finger (middle phalanx) fxs, vertigo    Patient Active Problem List   Diagnosis Date Noted  . Cough 03/30/2018  . Actinic keratoses 09/13/2016  . Bilateral hand pain 05/30/2016  . Neuropathy ([place]) 01/26/2016  . RBBB 09/02/2014  . CAD (coronary artery disease)   . Routine general medical examination at a health care facility 11/29/2011  . Prediabetes 12/02/2008  . Essential hypertension, benign 05/16/2007  . DIVERTICULOSIS, COLON 05/16/2007  . COLONIC POLYPS,  HYPERPLASTIC 04/03/2007  . Hyperlipemia 11/25/2006  . GERD 11/25/2006  . 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 Sleep disturbance 11/25/2006    Past Surgical History:  Procedure Laterality Date  . BRANCHIAL CLEFT CYST EXCISION    . COLONOSCOPY  04/03/2007  . HEMORRHOID SURGERY    . RHINOPLASTY          Home Medications    Prior to Admission medications   Medication Sig Start Date End Date Taking? Authorizing Provider  aspirin EC 81 MG tablet Take 81 mg by mouth daily.    [provider]  azithromycin (ZITHROMAX Z-PAK) 250 MG tablet Take 2 tablets (500 mg) on  Day 1,  followed by 1 tablet (250 mg) once daily on Days 2 through 5. 03/30/18   Venia Carbon, MD  benzonatate (TESSALON) 100 MG capsule Take 1 capsule (100 mg total) by mouth every 8 (eight) hours. 03/27/18   Loura Halt A, NP  diclofenac (VOLTAREN) 75 MG EC tablet Take 1 tablet (75 mg total) by mouth 2 (two) times daily. 08/18/17   Venia Carbon, MD  fluticasone (FLONASE) 50 MCG/ACT nasal spray USE TWO SPRAY IN EACH NOSTRIL DAILY 02/13/18   Venia Carbon, MD  HYDROcodone-homatropine [facility]) 5-1.5 MG/5ML syrup Take 5 mLs by mouth at bedtime as needed for cough. 03/30/18   Venia Carbon, MD  loratadine (CLARITIN) 10 MG  tablet Take 10 mg by mouth daily.    [provider]  metFORMIN (GLUCOPHAGE-XR) 500 MG 24 hr tablet Take 1 tablet (500 mg total) by mouth daily with breakfast. 02/13/18   Venia Carbon, MD  metoprolol tartrate (LOPRESSOR) 50 MG tablet TAKE 1 TABLET BY MOUTH DAILY 04/06/18   Venia Carbon, MD  Multiple Vitamin (MULTIVITAMIN) tablet Take 1 tablet by mouth daily.    [provider]  nitroGLYCERIN (NITROSTAT) 0.4 MG SL tablet DISSOLVE 1 TABLET UNDER THE TONGUE FOR CHEST PAIN. MAY REPEAT EVERY 5MINUTES UP TO 3 DOSES. IF NO RELIEF, CALL 911** 01/21/17   Venia Carbon, MD  Omega-3 Fatty Acids (OMEGA-3 FISH OIL PO) Take 1,000 mg by mouth daily.    [provider]  omeprazole (PRILOSEC) 20 MG capsule TAKE ONE CAPSULE BY MOUTH DAILY 02/19/18   Venia Carbon, MD  traZODone (DESYREL) 100 MG tablet Take 1-2 tablets (100-200 mg total) by mouth at bedtime as needed for sleep. 09/06/15   Venia Carbon, MD    Family History Family History  Problem Relation Age of Onset  . Coronary artery disease Mother   . Stroke Mother   . Coronary artery disease Brother   . Cancer Brother        colon or rectal  . Colon cancer Brother 90       half-brother   . Diabetes Neg Hx   . Stomach cancer Neg Hx     Social History Social History   Tobacco Use  . Smoking status: Former Smoker    Years: 35.00    Types: Cigarettes    Last attempt to quit: 10/27/2005    Years since quitting: 12.4  . Smokeless tobacco: Never Used  Substance Use Topics  . Alcohol use: Yes    Comment: once a month-beer   . Drug use: No     Allergies   Atorvastatin; Simvastatin; and Pravachol [pravastatin sodium]   Review of Systems Review of Systems  Constitutional: Negative for chills and fever.  HENT: Negative for rhinorrhea and sore throat.   Eyes: Negative for visual disturbance.  Respiratory: Positive for shortness of breath. Negative for cough.   Cardiovascular: Positive for chest pain. Negative for leg swelling.  Gastrointestinal: Negative for abdominal pain, diarrhea, nausea and vomiting.  Genitourinary: Negative for dysuria, frequency and urgency.  Musculoskeletal: Negative for joint swelling and neck pain.  Skin: Negative for rash and wound.  Neurological: Negative for syncope and numbness.  All other systems reviewed and are negative.    Physical Exam Updated Vital Signs BP (!) 150/84   Pulse 89   Temp 97.9 F (36.6 C)   Resp 20   Ht 5\' 10"  (1.778 m)   Wt 113.4 kg   SpO2 99%   BMI 35.87 kg/m   Physical Exam Vitals signs and nursing note reviewed.  Constitutional:      Appearance: He is well-developed.  HENT:     Head:  Normocephalic and atraumatic.  Eyes:     Conjunctiva/sclera: Conjunctivae normal.  Neck:     Musculoskeletal: Neck supple.  Cardiovascular:     Rate and Rhythm: Normal rate and regular rhythm.     Heart sounds: Normal heart sounds. No murmur.  Pulmonary:     Effort: Pulmonary effort is normal. No respiratory distress.     Breath sounds: Normal breath sounds. No wheezing or rales.  Abdominal:     General: Bowel sounds are normal. There is no distension.  Palpations: Abdomen is soft.     Tenderness: There is no abdominal tenderness.  Musculoskeletal: Normal range of motion.        General: No tenderness or deformity.  Skin:    General: Skin is warm and dry.     Findings: No erythema or rash.  Neurological:     Mental Status: He is alert and oriented to person, place, and time.  Psychiatric:        Behavior: Behavior normal.      ED Treatments / Results  Labs (all labs ordered are listed, but only abnormal results are displayed) Labs Reviewed  I-STAT TROPONIN, ED - Abnormal; Notable for the following components:      Result Value   Troponin i, poc 0.14 (*)    All other components within normal limits  BASIC METABOLIC PANEL  CBC    EKG EKG Interpretation  Date/Time:  Sunday April 12 2018 05:55:11 EST Ventricular Rate:  77 PR Interval:    QRS Duration: 94 QT Interval:  387 QTC Calculation: 438 R Axis:   -59 Text Interpretation:  Sinus arrhythmia Ventricular premature complex Left anterior fascicular block Abnormal R-wave progression, early transition No acute changes Confirmed by Varney Biles ([id number]) on 04/12/2018 6:29:21 AM   Radiology No results found.  Procedures .Critical Care Performed by: Etter Sjogren, PA-C Authorized by: Etter Sjogren, PA-C   Critical care provider statement:    Critical care time (minutes):  45   Critical care was necessary to treat or prevent imminent or life-threatening deterioration of the following  conditions:  Cardiac failure   Critical care was time spent personally by me on the following activities:  Discussions with consultants, evaluation of patient's response to treatment, examination of patient, ordering and performing treatments and interventions, ordering and review of laboratory studies, ordering and review of radiographic studies, pulse oximetry, re-evaluation of patient's condition, obtaining history from patient or surrogate and review of old charts   (including critical care time)  Medications Ordered in ED Medications  nitroGLYCERIN (NITROGLYN) 2 % ointment 0.5 inch (has no administration in time range)     Initial Impression / Assessment and Plan / ED Course  I have reviewed the triage vital signs and the nursing notes.  Pertinent labs & imaging results that were available during my care of the patient were reviewed by me and considered in my medical decision making (see chart for details).     Patient with a positive troponin, EKG nonactionable.  History and physical consistent with cardiac etiology of patient's chest pain.  History, physical and labs most consistent with NSTEMI.  Patient does not describe the pain as a ripping, tearing pain, low suspicion for dissection.  No evidence of pneumonia on chest x-ray.  Patient denies any recent history of trauma/trauma/immobilization, low suspicion for PE.  Nitropaste ordered for patient's pain.  He was already given aspirin prior to arrival.  Heparin bolus and drip ordered.  Will consult with cardiology.  Discussed with attending, Dr. Kathrynn Humble, who agrees with plan at this time.  Discussed with cardiology, Dr. Aldine Contes.  He is agreeable with plan.  They will consult on the patient ED.   8:19 AM Patient with persistent left arm pain.  Repeat EKG ordered shows T wave flattening in V2, V3.  Cardiology has not been down to see the patient.  Heparin is hung.  Will re-consult cardiology.  0830 Cardiologist NP to see  patient.   Final Clinical Impressions(s) / ED Diagnoses  Final diagnoses:  None    ED Discharge Orders    None       Etter Sjogren, [place] 04/12/18 [place], Ankit, MD 04/12/18 [phone]

## 2018-04-12 NOTE — Anesthesia Procedure Notes (Signed)
Central Venous Catheter Insertion Performed by: Catalina Gravel, MD, anesthesiologist Start/End12/15/2019 7:37 PM, 04/12/2018 7:42 PM Patient location: OR. Preanesthetic checklist: patient identified, IV checked, site marked, risks and benefits discussed, surgical consent, monitors and equipment checked, pre-op evaluation, timeout performed and anesthesia consent Hand hygiene performed  and maximum sterile barriers used  Total catheter length 100. PA cath was placed.Swan type:thermodilution PA Cath depth:55 Procedure performed without using ultrasound guided technique. Attempts: 1 Patient tolerated the procedure well with no immediate complications.

## 2018-04-12 NOTE — Brief Op Note (Signed)
04/12/2018  9:49 PM  PATIENT:  [name]  62 y.o. male  PRE-OPERATIVE DIAGNOSIS:  CAD  POST-OPERATIVE DIAGNOSIS:  CAD  PROCEDURE:  Procedure(s): CORONARY ARTERY BYPASS GRAFTING (CABG) x two , using left internal mammary artery and right leg greater saphenous vein harvested endoscopically (N/A) TRANSESOPHAGEAL ECHOCARDIOGRAM (TEE) (N/A) LIMA-LAD SVG-DIAG  SURGEON:  Surgeon(s) and Role:    Rexene Alberts, MD - Primary  PHYSICIAN ASSISTANT: WAYNE GOLD PA-C  ANESTHESIA:   general  EBL:  550   BLOOD ADMINISTERED:none  DRAINS: PLEURAL AND MEDIASTINAL CHEST TUBES   LOCAL MEDICATIONS USED:  NONE  SPECIMEN:  No Specimen  DISPOSITION OF SPECIMEN:  N/A  COUNTS:  YES  TOURNIQUET:  * No tourniquets in log *  DICTATION: .Dragon Dictation  PLAN OF CARE: Admit to inpatient   PATIENT DISPOSITION:  ICU - intubated and hemodynamically stable.   Delay start of Pharmacological VTE agent (>24hrs) due to surgical blood loss or risk of bleeding: yes  COMPLICATIONS: NO KNOWN

## 2018-04-12 NOTE — Progress Notes (Signed)
RN called regarding ST elevation on EKG associated with mid-sternum chest pain. Cardiology at bedside, cath team activated and transported to cath lab.

## 2018-04-12 NOTE — ED Triage Notes (Signed)
Patient states was seen last night here and was having CP when he left the hospital. Awaken this a.m. @ 05:00 with CP. Pt was given 324 mg asa and 1 nitro with some relief. Denies SOB and nausea.

## 2018-04-12 NOTE — Progress Notes (Signed)
   04/12/18 1722  Clinical Encounter Type  Visited With Health care provider (RN)  Visit Type Initial  Referral From Nurse  Consult/Referral To Chaplain  The chaplain responded to Code Stemi-Cath Lab by phone to unit [facility] RN-Melinda.  The RN checked with Pt. and family.  The Pt. declined a spiritual care visit at this time.  The chaplain will F/U as needed.

## 2018-04-12 NOTE — Anesthesia Procedure Notes (Signed)
Central Venous Catheter Insertion Performed by: Catalina Gravel, MD, anesthesiologist Start/End12/15/2019 7:27 PM, 04/12/2018 7:37 PM Patient location: OR. Preanesthetic checklist: patient identified, IV checked, site marked, risks and benefits discussed, surgical consent, monitors and equipment checked, pre-op evaluation, timeout performed and anesthesia consent Position: Trendelenburg Lidocaine 1% used for infiltration and patient sedated Hand hygiene performed  and maximum sterile barriers used  Catheter size: 8.5 Fr Sheath introducer Procedure performed using ultrasound guided technique. Ultrasound Notes:anatomy identified, needle tip was noted to be adjacent to the nerve/plexus identified, no ultrasound evidence of intravascular and/or intraneural injection and image(s) printed for medical record Attempts: 1 Following insertion, line sutured, dressing applied and Biopatch. Post procedure assessment: blood return through all ports, free fluid flow and no air  Patient tolerated the procedure well with no immediate complications.

## 2018-04-12 NOTE — ED Notes (Signed)
Paged cardiology in regards to Pt Troponin 0.77.

## 2018-04-12 NOTE — Op Note (Signed)
CARDIOTHORACIC SURGERY OPERATIVE NOTE  Date of Procedure: 04/12/2018  Preoperative Diagnosis:   Severe Single Vessel Coronary Artery Disease  Acute ST-Segment Elevation Myocardial Infarction  Postoperative Diagnosis: Same  Procedure:    Coronary Artery Bypass Grafting x 2   Left Internal Mammary Artery to Distal Left Anterior Descending Coronary Artery  Sapheonous Vein Graft to First Diagonal Branch Coronary Artery  Endoscopic Vein Harvest from Right Thigh  Surgeon: Valentina Gu. Roxy Manns, MD  Assistant: John Giovanni, PA-C  Anesthesia: Hoy Morn, MD  Operative Findings:  Normal left ventricular systolic function  Good quality left internal mammary artery conduit  Good quality saphenous vein conduit  Good quality target vessels for grafting    BRIEF CLINICAL NOTE AND INDICATIONS FOR SURGERY  Patient is a 62 year old male with known history of coronary artery disease, hypertension, hyperlipidemia, type 2 diabetes mellitus, and GE reflux disease who has been referred for emergent surgical consultation for management of acute ST segment elevation myocardial infarction with coronary anatomy unfavorable for percutaneous coronary mention.  Patient's cardiac history dates back to 2007 when he was evaluated for symptoms of chest pain.  Catheterization at that time reportedly demonstrated moderate single-vessel coronary artery disease with 50 to 70% stenosis of the left anterior descending coronary artery and 40% stenosis of the left circumflex.  Medical therapy was recommended.  Over the last several months the patient has experienced substernal chest discomfort with exertion typically relieved by rest.  Several days ago the patient began to develop substernal chest pain while walking to his car.  Symptoms subsided after several minutes.  Early this morning the patient awoke with similar chest discomfort which persisted.  He presented to the emergency department where symptoms had  subsided by the time of his initial evaluation.  Chest pain resolved with aspirin and nitroglycerin.  Baseline EKG revealed sinus rhythm without acute changes.  Troponin was positive, consistent with acute coronary syndrome and non-ST segment elevation myocardial infarction.  The patient was admitted to the hospital and anticoagulated using aspirin, Plavix, and intravenous heparin.  This afternoon the patient developed increased chest pain associated with marked ST segment elevation across the anterior leads.  Code STEMI was called and the patient was brought directly to the Cath Lab by Dr. Burt Knack.  Findings at the time of catheterization revealed critical 90% ostial stenosis of the left anterior descending coronary artery followed by 95% stenosis of mid left anterior descending coronary artery with what appears to be ruptured plaque.  There is insignificant disease in the left circumflex and right coronary territories.  Left ventricular systolic function appears preserved.  LVEDP was measured 19 mmHg.  [name] of ongoing chest pain while in the Cath Lab, although currently symptoms have subsided with intravenous nitroglycerin and high-dose heparin.  The patient has been seen in consultation and counseled at length regarding the indications, risks and potential benefits of surgery.  All questions have been answered, and the patient provides full informed consent for the operation as described.    DETAILS OF THE OPERATIVE PROCEDURE  Preparation:  The patient is brought to the operating room on the above mentioned date and central monitoring was established by the anesthesia team including placement of Swan-Ganz catheter and radial arterial line. The patient is placed in the supine position on the operating table.  Intravenous antibiotics are administered. General endotracheal anesthesia is induced uneventfully. A Foley catheter is placed.  Baseline transesophageal  echocardiogram was performed.  Findings were notable for normal  LV function.  There was mild hypokinesis of the distal anterior wall.  The patient's chest, abdomen, both groins, and both lower extremities are prepared and draped in a sterile manner. A time out procedure is performed.   Surgical Approach and Conduit Harvest:  A median sternotomy incision was performed and the left internal mammary artery is dissected from the chest wall and prepared for bypass grafting. The left internal mammary artery is notably good quality conduit. Simultaneously, the greater saphenous vein is obtained from the patient's right thigh using endoscopic vein harvest technique. The saphenous vein is notably good quality conduit. After removal of the saphenous vein, the small surgical incisions in the lower extremity are closed with absorbable suture. Following systemic heparinization, the left internal mammary artery was transected distally noted to have excellent flow.   Extracorporeal Cardiopulmonary Bypass and Myocardial Protection:  The pericardium is opened. The ascending aorta is mildly sclerotic but otherwise normal in appearance. The ascending aorta and the right atrium are cannulated for cardiopulmonary bypass.  Adequate heparinization is verified.     The entire pre-bypass portion of the operation was notable for stable hemodynamics.  Cardiopulmonary bypass was begun and the surface of the heart is inspected. Distal target vessels are selected for coronary artery bypass grafting. A cardioplegia cannula is placed in the ascending aorta.  A temperature probe was placed in the interventricular septum.  The patient is allowed to cool passively to [facility] systemic temperature.  The aortic cross clamp is applied and cold blood cardioplegia is delivered initially in an antegrade fashion through the aortic root.  Iced saline slush is applied for topical hypothermia.  The initial cardioplegic arrest is rapid with early  diastolic arrest.  Repeat doses of cardioplegia are administered intermittently throughout the entire cross clamp portion of the operation through the aortic root and through subsequently placed vein grafts in order to maintain completely flat electrocardiogram and septal myocardial temperature below 15C.  Myocardial protection was felt to be excellent.  Coronary Artery Bypass Grafting:   The first diagonal branch of the left anterior descending coronary artery was grafted using a reversed saphenous vein graft in an end-to-side fashion.  At the site of distal anastomosis the target vessel was good quality and measured approximately 1.7 mm in diameter.  The distal left anterior coronary artery was grafted with the left internal mammary artery in an end-to-side fashion.  At the site of distal anastomosis the target vessel was good quality and measured approximately 2.0 mm in diameter.  All proximal vein graft anastomoses were placed directly to the ascending aorta prior to removal of the aortic cross clamp.  The septal myocardial temperature rose rapidly after reperfusion of the left internal mammary artery graft.  The aortic cross clamp was removed after a total cross clamp time of 37 minutes.   Procedure Completion:  All proximal and distal coronary anastomoses were inspected for hemostasis and appropriate graft orientation. Epicardial pacing wires are fixed to the right ventricular outflow tract and to the right atrial appendage. The patient is rewarmed to 37C temperature. The patient is weaned and disconnected from cardiopulmonary bypass.  The patient's rhythm at separation from bypass was sinus.  The patient was weaned from cardiopulmonary bypass without any inotropic support. Total cardiopulmonary bypass time for the operation was 50 minutes.  Followup transesophageal echocardiogram performed after separation from bypass revealed no changes from the preoperative exam.  The aortic and venous  cannula were removed uneventfully. Protamine was administered to reverse the anticoagulation. The  mediastinum and pleural space were inspected for hemostasis and irrigated with saline solution. The mediastinum and the left pleural space were drained using 3 chest tubes placed through separate stab incisions inferiorly.  The soft tissues anterior to the aorta were reapproximated loosely. The sternum is closed with double strength sternal wire. The soft tissues anterior to the sternum were closed in multiple layers and the skin is closed with a running subcuticular skin closure.  The post-bypass portion of the operation was notable for stable rhythm and hemodynamics.  No blood products were administered during the operation.   Disposition:  The patient tolerated the procedure well and is transported to the surgical intensive care in stable condition. There are no intraoperative complications. All sponge instrument and needle counts are verified correct at completion of the operation.    Valentina Gu. Roxy Manns MD 04/12/2018 10:39 PM

## 2018-04-12 NOTE — Anesthesia Procedure Notes (Addendum)
Arterial Line Insertion Start/End12/15/2019 7:25 PM, 04/12/2018 7:33 PM Performed by: Suzy Bouchard, CRNA  Preanesthetic checklist: patient identified, IV checked, surgical consent, monitors and equipment checked, pre-op evaluation and timeout performed Patient sedated radial was placed Catheter size: 20 G Hand hygiene performed  and maximum sterile barriers used   Attempts: 1 Procedure performed without using ultrasound guided technique. Following insertion, dressing applied and Biopatch. Post procedure assessment: normal  Patient tolerated the procedure well with no immediate complications.

## 2018-04-12 NOTE — OR Nursing (Signed)
22:10 - 45 minute call to SICU charge nurse 22:35 - 20 minute call to SICU charge nurse

## 2018-04-12 NOTE — ED Notes (Signed)
Consent signed and placed at the bedside.

## 2018-04-12 NOTE — Anesthesia Preprocedure Evaluation (Addendum)
Anesthesia Evaluation  Patient identified by MRN, date of birth, ID band Patient awake    Reviewed: Allergy & Precautions, NPO status , Patient's Chart, lab work & pertinent test results, reviewed documented beta blocker date and time   Airway Mallampati: II  TM Distance: >3 FB Neck ROM: Full    Dental  (+) Dental Advisory Given, Partial Upper   Pulmonary former smoker,    Pulmonary exam normal breath sounds clear to auscultation       Cardiovascular hypertension, Pt. on home beta blockers + CAD and + Past MI  Normal cardiovascular exam+ dysrhythmias (RBBB)  Rhythm:Regular Rate:Normal  Echo 04/12/18: Left ventricle: The cavity size was normal. Wall thickness was normal. Systolic function was normal. The estimated ejection fraction was in the range of 55% to 60%. Diffuse hypokinesis. Doppler parameters are consistent with abnormal left ventricular relaxation (grade 1 diastolic dysfunction).   Neuro/Psych negative neurological ROS  negative psych ROS   GI/Hepatic Neg liver ROS, GERD  Medicated,  Endo/Other  diabetes, Type 2, Oral Hypoglycemic AgentsObesity   Renal/GU negative Renal ROS     Musculoskeletal negative musculoskeletal ROS (+)   Abdominal   Peds  Hematology negative hematology ROS (+)   Anesthesia Other Findings Day of surgery medications reviewed with the patient.  Reproductive/Obstetrics                            Anesthesia Physical Anesthesia Plan  ASA: IV  Anesthesia Plan: General   Post-op Pain Management:    Induction: Intravenous  PONV Risk Score and Plan: 2 and Treatment may vary due to age or medical condition  Airway Management Planned: Oral ETT  Additional Equipment: Arterial line, CVP, PA Cath, TEE and Ultrasound Guidance Line Placement  Intra-op Plan:   Post-operative Plan: Post-operative intubation/ventilation  Informed Consent: I have reviewed the  patients History and Physical, chart, labs and discussed the procedure including the risks, benefits and alternatives for the proposed anesthesia with the patient or authorized representative who has indicated his/her understanding and acceptance.   Dental advisory given  Plan Discussed with: CRNA  Anesthesia Plan Comments:         Anesthesia Quick Evaluation

## 2018-04-13 ENCOUNTER — Inpatient Hospital Stay (HOSPITAL_COMMUNITY): Payer: BLUE CROSS/BLUE SHIELD

## 2018-04-13 ENCOUNTER — Encounter (HOSPITAL_COMMUNITY): Payer: Self-pay | Admitting: Cardiovascular Disease

## 2018-04-13 DIAGNOSIS — I2102 ST elevation (STEMI) myocardial infarction involving left anterior descending coronary artery: Principal | ICD-10-CM

## 2018-04-13 LAB — POCT I-STAT 3, ART BLOOD GAS (G3+)
Acid-base deficit: 2 mmol/L (ref 0.0–2.0)
Acid-base deficit: 5 mmol/L — ABNORMAL HIGH (ref 0.0–2.0)
Bicarbonate: 23.9 mmol/L (ref 20.0–28.0)
Bicarbonate: 19.7 mmol/L — ABNORMAL LOW (ref 20.0–28.0)
O2 Saturation: 95 %
O2 Saturation: 89 %
Patient temperature: 37
Patient temperature: 37.3
TCO2: 25 mmol/L (ref 22–32)
TCO2: 21 mmol/L — ABNORMAL LOW (ref 22–32)
pCO2 arterial: 43.9 mmHg (ref 32.0–48.0)
pCO2 arterial: 36.8 mmHg (ref 32.0–48.0)
pH, Arterial: 7.345 — ABNORMAL LOW (ref 7.350–7.450)
pH, Arterial: 7.339 — ABNORMAL LOW (ref 7.350–7.450)
pO2, Arterial: 82 mmHg — ABNORMAL LOW (ref 83.0–108.0)
pO2, Arterial: 60 mmHg — ABNORMAL LOW (ref 83.0–108.0)

## 2018-04-13 LAB — GLUCOSE, CAPILLARY
Glucose-Capillary: 109 mg/dL — ABNORMAL HIGH (ref 70–99)
Glucose-Capillary: 114 mg/dL — ABNORMAL HIGH (ref 70–99)
Glucose-Capillary: 116 mg/dL — ABNORMAL HIGH (ref 70–99)
Glucose-Capillary: 117 mg/dL — ABNORMAL HIGH (ref 70–99)
Glucose-Capillary: 120 mg/dL — ABNORMAL HIGH (ref 70–99)
Glucose-Capillary: 125 mg/dL — ABNORMAL HIGH (ref 70–99)
Glucose-Capillary: 126 mg/dL — ABNORMAL HIGH (ref 70–99)
Glucose-Capillary: 127 mg/dL — ABNORMAL HIGH (ref 70–99)
Glucose-Capillary: 130 mg/dL — ABNORMAL HIGH (ref 70–99)
Glucose-Capillary: 135 mg/dL — ABNORMAL HIGH (ref 70–99)
Glucose-Capillary: 152 mg/dL — ABNORMAL HIGH (ref 70–99)
Glucose-Capillary: 156 mg/dL — ABNORMAL HIGH (ref 70–99)
Glucose-Capillary: 163 mg/dL — ABNORMAL HIGH (ref 70–99)
Glucose-Capillary: 79 mg/dL (ref 70–99)

## 2018-04-13 LAB — MAGNESIUM
Magnesium: 2.5 mg/dL — ABNORMAL HIGH (ref 1.7–2.4)
Magnesium: 2.4 mg/dL (ref 1.7–2.4)

## 2018-04-13 LAB — CBC
HCT: 37 % — ABNORMAL LOW (ref 39.0–52.0)
HCT: 36.2 % — ABNORMAL LOW (ref 39.0–52.0)
Hemoglobin: 11.4 g/dL — ABNORMAL LOW (ref 13.0–17.0)
Hemoglobin: 11.3 g/dL — ABNORMAL LOW (ref 13.0–17.0)
MCH: 28.4 pg (ref 26.0–34.0)
MCH: 29.2 pg (ref 26.0–34.0)
MCHC: 30.8 g/dL (ref 30.0–36.0)
MCHC: 31.2 g/dL (ref 30.0–36.0)
MCV: 92.3 fL (ref 80.0–100.0)
MCV: 93.5 fL (ref 80.0–100.0)
Platelets: 233 10*3/uL (ref 150–400)
Platelets: 218 10*3/uL (ref 150–400)
RBC: 4.01 MIL/uL — ABNORMAL LOW (ref 4.22–5.81)
RBC: 3.87 MIL/uL — ABNORMAL LOW (ref 4.22–5.81)
RDW: 13 % (ref 11.5–15.5)
RDW: 13.3 % (ref 11.5–15.5)
WBC: 12.1 10*3/uL — ABNORMAL HIGH (ref 4.0–10.5)
WBC: 12.8 10*3/uL — ABNORMAL HIGH (ref 4.0–10.5)
nRBC: 0 % (ref 0.0–0.2)
nRBC: 0 % (ref 0.0–0.2)

## 2018-04-13 LAB — BASIC METABOLIC PANEL
Anion gap: 11 (ref 5–15)
BUN: 8 mg/dL (ref 8–23)
CO2: 20 mmol/L — ABNORMAL LOW (ref 22–32)
Calcium: 8 mg/dL — ABNORMAL LOW (ref 8.9–10.3)
Chloride: 108 mmol/L (ref 98–111)
Creatinine, Ser: 0.79 mg/dL (ref 0.61–1.24)
GFR calc Af Amer: >60 mL/min (ref >60)
GFR calc non Af Amer: >60 mL/min (ref >60)
Glucose, Bld: 125 mg/dL — ABNORMAL HIGH (ref 70–99)
Potassium: 4 mmol/L (ref 3.5–5.1)
Sodium: 139 mmol/L (ref 135–145)

## 2018-04-13 LAB — POCT I-STAT, CHEM 8
BUN: 10 mg/dL (ref 8–23)
Calcium, Ion: 1.22 mmol/L (ref 1.15–1.40)
Chloride: 104 mmol/L (ref 98–111)
Creatinine, Ser: 0.9 mg/dL (ref 0.61–1.24)
Glucose, Bld: 148 mg/dL — ABNORMAL HIGH (ref 70–99)
HCT: 35 % — ABNORMAL LOW (ref 39.0–52.0)
Hemoglobin: 11.9 g/dL — ABNORMAL LOW (ref 13.0–17.0)
Potassium: 4.1 mmol/L (ref 3.5–5.1)
Sodium: 137 mmol/L (ref 135–145)
TCO2: 25 mmol/L (ref 22–32)

## 2018-04-13 LAB — LIPID PANEL
Cholesterol: 137 mg/dL (ref 0–200)
HDL: 31 mg/dL — ABNORMAL LOW (ref >40)
LDL Cholesterol: 89 mg/dL (ref 0–99)
Total CHOL/HDL Ratio: 4.4 RATIO
Triglycerides: 83 mg/dL (ref <150)
VLDL: 17 mg/dL (ref 0–40)

## 2018-04-13 LAB — CREATININE, SERUM
Creatinine, Ser: 0.87 mg/dL (ref 0.61–1.24)
GFR calc Af Amer: >60 mL/min (ref >60)
GFR calc non Af Amer: >60 mL/min (ref >60)

## 2018-04-13 LAB — MRSA PCR SCREENING: MRSA by PCR: NEGATIVE

## 2018-04-13 LAB — POCT ACTIVATED CLOTTING TIME: Activated Clotting Time: 197 seconds

## 2018-04-13 LAB — HIV ANTIBODY (ROUTINE TESTING W REFLEX): HIV Screen 4th Generation wRfx: NONREACTIVE

## 2018-04-13 MED ORDER — KETOROLAC TROMETHAMINE 15 MG/ML IJ SOLN
15.0000 mg | Freq: Four times a day (QID) | INTRAMUSCULAR | Status: AC
  Administered 2018-04-13 – 2018-04-14 (×5): 15 mg via INTRAVENOUS
  Filled 2018-04-13 (×4): qty 1

## 2018-04-13 MED ORDER — CHLORHEXIDINE GLUCONATE 0.12 % MT SOLN
15.0000 mL | Freq: Two times a day (BID) | OROMUCOSAL | Status: DC
  Administered 2018-04-13 – 2018-04-15 (×4): 15 mL via OROMUCOSAL
  Filled 2018-04-13 (×4): qty 15

## 2018-04-13 MED ORDER — ROSUVASTATIN CALCIUM 5 MG PO TABS
10.0000 mg | ORAL_TABLET | Freq: Every day | ORAL | Status: DC
  Administered 2018-04-14 – 2018-04-15 (×2): 10 mg via ORAL
  Filled 2018-04-13 (×3): qty 2

## 2018-04-13 MED ORDER — CLOPIDOGREL BISULFATE 75 MG PO TABS
75.0000 mg | ORAL_TABLET | Freq: Every day | ORAL | Status: DC
  Administered 2018-04-14 – 2018-04-16 (×3): 75 mg via ORAL
  Filled 2018-04-13 (×3): qty 1

## 2018-04-13 MED ORDER — SODIUM CHLORIDE 0.9% FLUSH
10.0000 mL | INTRAVENOUS | Status: DC | PRN
  Administered 2018-04-13 (×2): 10 mL via INTRAVENOUS
  Filled 2018-04-13 (×2): qty 10

## 2018-04-13 MED ORDER — ASPIRIN EC 325 MG PO TBEC
325.0000 mg | DELAYED_RELEASE_TABLET | Freq: Every day | ORAL | Status: AC
  Administered 2018-04-13: 325 mg via ORAL
  Filled 2018-04-13: qty 1

## 2018-04-13 MED ORDER — INSULIN ASPART 100 UNIT/ML ~~LOC~~ SOLN
0.0000 [IU] | SUBCUTANEOUS | Status: DC
  Administered 2018-04-13 – 2018-04-14 (×5): 2 [IU] via SUBCUTANEOUS

## 2018-04-13 MED ORDER — SODIUM CHLORIDE 0.9% FLUSH
10.0000 mL | Freq: Two times a day (BID) | INTRAVENOUS | Status: DC
  Administered 2018-04-13 – 2018-04-15 (×2): 10 mL via INTRAVENOUS

## 2018-04-13 MED ORDER — TOBRAMYCIN 0.3 % OP SOLN
2.0000 [drp] | Freq: Four times a day (QID) | OPHTHALMIC | Status: DC
  Administered 2018-04-13 – 2018-04-16 (×12): 2 [drp] via OPHTHALMIC
  Filled 2018-04-13: qty 5

## 2018-04-13 MED ORDER — ORAL CARE MOUTH RINSE
15.0000 mL | Freq: Two times a day (BID) | OROMUCOSAL | Status: DC
  Administered 2018-04-13 (×2): 15 mL via OROMUCOSAL

## 2018-04-13 MED ORDER — TRAZODONE HCL 100 MG PO TABS
100.0000 mg | ORAL_TABLET | Freq: Every evening | ORAL | Status: DC | PRN
  Administered 2018-04-13: 100 mg via ORAL
  Filled 2018-04-13: qty 2

## 2018-04-13 MED ORDER — CHLORHEXIDINE GLUCONATE CLOTH 2 % EX PADS
6.0000 | MEDICATED_PAD | Freq: Every day | CUTANEOUS | Status: DC
  Administered 2018-04-14 (×2): 6 via TOPICAL

## 2018-04-13 MED ORDER — ASPIRIN EC 81 MG PO TBEC
81.0000 mg | DELAYED_RELEASE_TABLET | Freq: Every day | ORAL | Status: DC
  Administered 2018-04-14 – 2018-04-16 (×3): 81 mg via ORAL
  Filled 2018-04-13 (×3): qty 1

## 2018-04-13 MED ORDER — SODIUM CHLORIDE 0.9% FLUSH
10.0000 mL | Freq: Two times a day (BID) | INTRAVENOUS | Status: DC
  Administered 2018-04-14 – 2018-04-15 (×2): 10 mL

## 2018-04-13 MED ORDER — ENOXAPARIN SODIUM 30 MG/0.3ML ~~LOC~~ SOLN
30.0000 mg | Freq: Every day | SUBCUTANEOUS | Status: DC
  Administered 2018-04-14 – 2018-04-15 (×2): 30 mg via SUBCUTANEOUS
  Filled 2018-04-13 (×2): qty 0.3

## 2018-04-13 MED ORDER — SODIUM CHLORIDE 0.9% FLUSH: 10.0000 mL | INTRAVENOUS | Status: DC | PRN

## 2018-04-13 MED FILL — Nitroglycerin IV Soln 100 MCG/ML in D5W: INTRA_ARTERIAL | Qty: 10 | Status: AC

## 2018-04-13 NOTE — Progress Notes (Signed)
[address]       [place] [address]             [phone]        CARDIOTHORACIC SURGERY PROGRESS NOTE   R1 Day Post-Op Procedure(s) (LRB): CORONARY ARTERY BYPASS GRAFTING (CABG) x two , using left internal mammary artery and right leg greater saphenous vein harvested endoscopically (N/A) TRANSESOPHAGEAL ECHOCARDIOGRAM (TEE) (N/A)  Subjective: Looks good.  Sore in chest.  No SOB.  No nausea  Objective: Vital signs: BP Readings from Last 1 Encounters:  04/13/18 98/63   Pulse Readings from Last 1 Encounters:  04/13/18 89   Resp Readings from Last 1 Encounters:  04/13/18 (!) 24   Temp Readings from Last 1 Encounters:  04/13/18 99.9 F (37.7 C)    Hemodynamics: PAP: (18-33)/(12-21) 27/14 CO:  [4.4 L/min-5.6 L/min] 5.5 L/min CI:  [1.9 L/min/m2-2.5 L/min/m2] 2.4 L/min/m2  Physical Exam:  Rhythm:   sinus  Breath sounds: clear  Heart sounds:  RRR  Incisions:  Dressing dry, intact  Abdomen:  Soft, non-distended, non-tender  Extremities:  Warm, well-perfused  Chest tubes:  low volume thin serosanguinous output, no air leak    Intake/Output from previous day: 12/15 0701 - 12/16 0700 In: 3911 [P.O.:60; I.V.:2578.1; Blood:375; IV Piggyback:897.9] Out: 2145 [Urine:1250; Blood:575; Chest Tube:320] Intake/Output this shift: No intake/output data recorded.  Lab Results:  CBC: Recent Labs    04/12/18 2318 04/13/18 0415  WBC 15.4* 12.1*  HGB 11.8* 11.4*  HCT 38.2* 37.0*  PLT 236 233    BMET:  Recent Labs    04/12/18 1847  04/12/18 2219 04/12/18 2314 04/13/18 0415  NA 131*   < > 139 141 139  K 3.2*   < > 4.3 4.4 4.0  CL 99   < > 106  --  108  CO2 22  --   --   --  20*  GLUCOSE 113*   < > 129* 132* 125*  BUN 12   < > 11  --  8  CREATININE 0.85   < > 0.80  --  0.79  CALCIUM 7.6*  --   --   --  8.0*   < > = values in this interval not displayed.     PT/INR:   Recent Labs    04/12/18 2318  LABPROT 15.1  INR 1.21    CBG  (last 3)  Recent Labs    04/13/18 0518 04/13/18 0615 04/13/18 0817  GLUCAP 116* 126* 163*    ABG    Component Value Date/Time   PHART 7.339 (L) 04/13/2018 0522   PCO2ART 36.8 04/13/2018 0522   PO2ART 60.0 (L) 04/13/2018 0522   HCO3 19.7 (L) 04/13/2018 0522   TCO2 21 (L) 04/13/2018 0522   ACIDBASEDEF 5.0 (H) 04/13/2018 0522   O2SAT 89.0 04/13/2018 0522    CXR: PORTABLE CHEST 1 VIEW  COMPARISON:  April 12, 2018  FINDINGS: Endotracheal tube and nasogastric tube have been removed. Swan-Ganz catheter tip is in the proximal right main pulmonary artery. There is a left chest tube and a mediastinal drain. No pneumothorax evident. There is patchy atelectatic change in both lower lobes. The lungs elsewhere are clear. Heart is upper normal in size with pulmonary vascularity normal. No adenopathy. No bone lesions.  IMPRESSION: Tube and catheter positions as described without pneumothorax. Bilateral lower lobe atelectatic change. No frank consolidation. Stable cardiac silhouette.   Electronically Signed   By: Lowella Grip III M.D.  On: 04/13/2018 07:13   EKG: NSR w/out acute ischemic changes    Assessment/Plan: S/P Procedure(s) (LRB): CORONARY ARTERY BYPASS GRAFTING (CABG) x two , using left internal mammary artery and right leg greater saphenous vein harvested endoscopically (N/A) TRANSESOPHAGEAL ECHOCARDIOGRAM (TEE) (N/A)  Doing well POD1 Maintaining NSR w/ stable hemodynamics, no drips Breathing comfortably w/ O2 sats  95% on 4 L/min, CXR looks good S/P acute STEMI Expected post op acute blood loss anemia, very mild Expected post op atelectasis, very mild Expected post op volume excess, preop weight unknown Type II diabetes mellitus, excellent glycemic control   Mobilize  D/C lines  D/C tubes later today or tomorrow, depending on output  DAPT  Beta blocker  Will try Crestor - patient has been intolerant of several statins in the past but  doesn't recall every having Crestor     Rexene Alberts, MD 04/13/2018 8:28 AM

## 2018-04-13 NOTE — Progress Notes (Signed)
27fr sheath aspirated and removed from right radial artery. TR band applied and adjusted to 14cc air. No s+s of hematoma. Spo2 99% as measured in right hand. Arm raised up and supported on pillows.   14cc @ 12:20am.

## 2018-04-13 NOTE — Progress Notes (Signed)
EVENING ROUNDS NOTE :     [address]       [place], [address]             [phone]                 1 Day Post-Op Procedure(s) (LRB): CORONARY ARTERY BYPASS GRAFTING (CABG) x two , using left internal mammary artery and right leg greater saphenous vein harvested endoscopically (N/A) TRANSESOPHAGEAL ECHOCARDIOGRAM (TEE) (N/A)  Total Length of Stay:  LOS: 1 day  BP 116/63   Pulse 89   Temp 98 F (36.7 C) (Oral)   Resp 19   Ht 5\' 10"  (1.778 m)   Wt 114.6 kg   SpO2 98%   BMI 36.25 kg/m   .Intake/Output      12/15 0701 - 12/16 0700 12/16 0701 - 12/17 0700   P.O. 60    I.V. (mL/kg) 2578.1 (22.5) 367.4 (3.2)   Blood 375    IV Piggyback 897.9 315.9   Total Intake(mL/kg) 3911 (34.1) 683.3 (6)   Urine (mL/kg/hr) 1250 (0.5) 350 (0.3)   Blood 575    Chest Tube 320 180   Total Output 2145 530   Net +1766 +153.3          . sodium chloride    . cefUROXime (ZINACEF)  IV Stopped (04/13/18 1508)  . lactated ringers    . lactated ringers Stopped (04/13/18 1438)     Lab Results  Component Value Date   WBC 12.1 (H) 04/13/2018   HGB 11.9 (L) 04/13/2018   HCT 35.0 (L) 04/13/2018   PLT 233 04/13/2018   GLUCOSE 148 (H) 04/13/2018   CHOL 137 04/13/2018   TRIG 83 04/13/2018   HDL 31 (L) 04/13/2018   LDLDIRECT 171.0 12/25/2012   LDLCALC 89 04/13/2018   ALT 32 04/12/2018   AST 34 04/12/2018   NA 137 04/13/2018   K 4.1 04/13/2018   CL 104 04/13/2018   CREATININE 0.90 04/13/2018   BUN 10 04/13/2018   CO2 20 (L) 04/13/2018   TSH 0.95 12/25/2012   PSA 0.46 12/27/2013   INR 1.21 04/12/2018   HGBA1C 6.2 (H) 04/12/2018   MICROALBUR 26.9 (H) 11/18/2011   Extubated Stable day, up to chair    [name] Isaac MD  Beeper [phone] Office [phone] 04/13/2018 5:33 PM

## 2018-04-13 NOTE — Progress Notes (Signed)
Pt admitted from O.R. s/p emergent CABG with O.R staff. Pt attached to unit monitor and vent, report received from [place]. VSS, Will continue to monitor and assess for readiness for Rapid weaning of ventilator.

## 2018-04-13 NOTE — Progress Notes (Signed)
With Precedex turned off per Rapid weran protocol, Pt awoke calmly and able to follow commands and lift head off bed. Resp, therapist notified and vent weaning commenced.

## 2018-04-13 NOTE — Procedures (Signed)
Extubation Procedure Note  Patient Details:   Name: Kraig Genis DOB: 07-29-1955 MRN: [id number]   Airway Documentation:    Vent end date: 04/13/18 Vent end time: 0435   Evaluation  O2 sats: stable throughout Complications: No apparent complications Patient did tolerate procedure well. Bilateral Breath Sounds: Clear, Diminished   Yes   NIF was -40 and VC greater than 1.6L.  Nathen Balaban, Braden P 04/13/2018, 4:40 AM

## 2018-04-13 NOTE — Anesthesia Postprocedure Evaluation (Addendum)
Anesthesia Post Note  Patient: Artavious Trebilcock  Procedure(s) Performed: CORONARY ARTERY BYPASS GRAFTING (CABG) x two , using left internal mammary artery and right leg greater saphenous vein harvested endoscopically (N/A Chest) TRANSESOPHAGEAL ECHOCARDIOGRAM (TEE) (N/A )     Patient location during evaluation: SICU Anesthesia Type: General Level of consciousness: sedated and patient remains intubated per anesthesia plan Pain management: pain level controlled Vital Signs Assessment: post-procedure vital signs reviewed and stable Respiratory status: patient remains intubated per anesthesia plan Cardiovascular status: stable Postop Assessment: no apparent nausea or vomiting Anesthetic complications: no    Last Vitals:  Vitals:   04/13/18 0645 04/13/18 0700  BP:  98/63  Pulse: 90 89  Resp: (!) 26 (!) 24  Temp: 37.7 C 37.7 C  SpO2: 98% 96%    Last Pain:  Vitals:   04/13/18 0741  TempSrc:   PainSc: 7                  Catalina Gravel

## 2018-04-13 NOTE — Progress Notes (Signed)
Progress Note  Patient Name: [name] Date of Encounter: 04/13/2018  Primary Cardiologist: No primary care provider on file.   Subjective   Expected chest soreness.  No other specific complaints.  Wife at bedside.  Inpatient Medications    Scheduled Meds: . acetaminophen  1,000 mg Oral Q6H   Or  . acetaminophen (TYLENOL) oral liquid 160 mg/5 mL  1,000 mg Per Tube Q6H  . aspirin EC  325 mg Oral Daily  . [START ON 04/14/2018] aspirin EC  81 mg Oral Daily  . bisacodyl  10 mg Oral Daily   Or  . bisacodyl  10 mg Rectal Daily  . chlorhexidine  15 mL Mouth Rinse BID  . [START ON 04/14/2018] clopidogrel  75 mg Oral Daily  . docusate sodium  200 mg Oral Daily  . [START ON 04/14/2018] enoxaparin (LOVENOX) injection  30 mg Subcutaneous QHS  . insulin aspart  0-24 Units Subcutaneous Q4H  . ketorolac  15 mg Intravenous Q6H  . mouth rinse  15 mL Mouth Rinse q12n4p  . metoprolol tartrate  12.5 mg Oral BID  . [START ON 04/14/2018] pantoprazole  40 mg Oral Daily  . [START ON 04/14/2018] rosuvastatin  10 mg Oral q1800  . sodium chloride flush  10 mL Intravenous Q12H  . sodium chloride flush  3 mL Intravenous Q12H   Continuous Infusions: . sodium chloride    . albumin human Stopped (04/13/18 0347)  . cefUROXime (ZINACEF)  IV Stopped (04/13/18 0307)  . famotidine (PEPCID) IV 100 mL/hr at 04/13/18 0900  . lactated ringers    . lactated ringers 20 mL/hr at 04/13/18 0900   PRN Meds: albumin human, metoprolol tartrate, morphine injection, ondansetron (ZOFRAN) IV, oxyCODONE, sodium chloride flush, sodium chloride flush, traMADol, [START ON 04/14/2018] traZODone   Vital Signs    Vitals:   04/13/18 0645 04/13/18 0700 04/13/18 0800 04/13/18 0900  BP:  98/63 99/66 101/67  Pulse: 90 89 89 89  Resp: (!) 26 (!) 24 (!) 24 17  Temp: 99.9 F (37.7 C) 99.9 F (37.7 C) 99.5 F (37.5 C) 99 F (37.2 C)  TempSrc:      SpO2: 98% 96% 96% 95%  Weight: 114.6 kg     Height:         Intake/Output Summary (Last 24 hours) at 04/13/2018 0918 Last data filed at 04/13/2018 0900 Gross per 24 hour  Intake 4383.71 ml  Output 2195 ml  Net 2188.71 ml   Filed Weights   04/12/18 1300 04/13/18 0000 04/13/18 0645  Weight: 111.7 kg 111.7 kg 114.6 kg    Telemetry    Atrial paced 90 bpm- Personally Reviewed  ECG    Normal sinus rhythm 80 bpm, nonspecific ST abnormality - Personally Reviewed  Physical Exam  Alert, oriented male GEN: No acute distress.   Neck: No JVD Cardiac: RRR, no murmurs, rubs, or gallops.  Respiratory: Clear to auscultation bilaterally. GI: Soft, nontender, non-distended  MS:  Diffuse upper extremity edema noted; No deformity. Neuro:  Nonfocal  Psych: Normal affect   Labs    Chemistry Recent Labs  Lab 04/12/18 1030 04/12/18 1847  04/12/18 2123 04/12/18 2219 04/12/18 2314 04/13/18 0415  NA 140 131*   < > 139 139 141 139  K 3.9 3.2*   < > 5.2* 4.3 4.4 4.0  CL 104 99   < > 106 106  --  108  CO2 22 22  --   --   --   --  20*  GLUCOSE 116* 113*   < > 112* 129* 132* 125*  BUN 12 12   < > 11 11  --  8  CREATININE 0.92 0.85   < > 0.70 0.80  --  0.79  CALCIUM 9.0 7.6*  --   --   --   --  8.0*  PROT  --  5.9*  --   --   --   --   --   ALBUMIN  --  3.0*  --   --   --   --   --   AST  --  34  --   --   --   --   --   ALT  --  32  --   --   --   --   --   ALKPHOS  --  66  --   --   --   --   --   BILITOT  --  1.0  --   --   --   --   --   GFRNONAA >60 >60  --   --   --   --  >60  GFRAA >60 >60  --   --   --   --  >60  ANIONGAP 14 10  --   --   --   --  11   < > = values in this interval not displayed.     Hematology Recent Labs  Lab 04/12/18 1847  04/12/18 2135  04/12/18 2314 04/12/18 2318 04/13/18 0415  WBC 10.7*  --   --   --   --  15.4* 12.1*  RBC 4.43  --   --   --   --  4.14* 4.01*  HGB 12.6*   < > 10.1*   < > 11.6* 11.8* 11.4*  HCT 40.2   < > 31.4*   < > 34.0* 38.2* 37.0*  MCV 90.7  --   --   --   --  92.3 92.3  MCH  28.4  --   --   --   --  28.5 28.4  MCHC 31.3  --   --   --   --  30.9 30.8  RDW 13.0  --   --   --   --  12.9 13.0  PLT 337  --  293  --   --  236 233   < > = values in this interval not displayed.    Cardiac Enzymes Recent Labs  Lab 04/12/18 0642 04/12/18 1030 04/12/18 1512 04/12/18 1847  TROPONINI 0.21* 0.77* 1.68* 1.07*    Recent Labs  Lab 04/12/18 0612  TROPIPOC 0.14*     BNPNo results for input(s): BNP, PROBNP in the last 168 hours.   DDimer No results for input(s): DDIMER in the last 168 hours.   Radiology    Dg Chest 2 View  Result Date: 04/12/2018 CLINICAL DATA:  Chest pain. Rule out pneumonia, recent history of pneumonia. EXAM: CHEST - 2 VIEW COMPARISON:  Radiograph 03/30/2018 FINDINGS: Progression in diffuse peribronchial thickening from prior exam. The more vague right perihilar opacity is no longer seen. Heart size upper normal with unchanged mediastinal contours. No pneumothorax. No pleural effusion. No acute osseous abnormalities. IMPRESSION: 1. Asymmetric right lung opacity on prior exam is no longer seen. 2. New/progression of peribronchial thickening, favoring bronchitic change over pulmonary edema. Electronically Signed   By: Keith Rake M.D.   On: 04/12/2018 06:39   Dg Chest  Port 1 View  Result Date: 04/13/2018 CLINICAL DATA:  Extubation. Recent coronary artery bypass grafting. Chest pain. EXAM: PORTABLE CHEST 1 VIEW COMPARISON:  April 12, 2018 FINDINGS: Endotracheal tube and nasogastric tube have been removed. Swan-Ganz catheter tip is in the proximal right main pulmonary artery. There is a left chest tube and a mediastinal drain. No pneumothorax evident. There is patchy atelectatic change in both lower lobes. The lungs elsewhere are clear. Heart is upper normal in size with pulmonary vascularity normal. No adenopathy. No bone lesions. IMPRESSION: Tube and catheter positions as described without pneumothorax. Bilateral lower lobe atelectatic change. No  frank consolidation. Stable cardiac silhouette. Electronically Signed   By: Lowella Grip III M.D.   On: 04/13/2018 07:13   Dg Chest Port 1 View  Result Date: 04/12/2018 CLINICAL DATA:  Post intubation EXAM: PORTABLE CHEST 1 VIEW COMPARISON:  04/12/2018 FINDINGS: Endotracheal tube is 7 cm above the carina. Swan-Ganz catheter tip in the central right pulmonary artery. Left chest tube in place, no pneumothorax. Changes of CABG. Minimal right base atelectasis. No visible effusions. IMPRESSION: Changes of CABG.  Support devices as above.  No pneumothorax. Right base atelectasis. Electronically Signed   By: Rolm Baptise M.D.   On: 04/12/2018 23:36    Cardiac Studies   2D echocardiogram: Study Conclusions  - Left ventricle: The cavity size was normal. Wall thickness was   normal. Systolic function was normal. The estimated ejection   fraction was in the range of 55% to 60%. Diffuse hypokinesis.   Doppler parameters are consistent with abnormal left ventricular   relaxation (grade 1 diastolic dysfunction).  Cardiac catheterization: Conclusion     Dist LM to Ost LAD lesion is 90% stenosed.  Prox LAD to Mid LAD lesion is 95% stenosed.  There is mild left ventricular systolic dysfunction.  LV end diastolic pressure is normal.  The left ventricular ejection fraction is 50-55% by visual estimate.   1.  Severe single-vessel coronary artery disease involving the ostium and the midportion of the LAD which correlates with the patient's acute injury current on EKG 2.  Patent left mainstem, left circumflex, and RCA with mild nonobstructive stenosis 3.  Mild contraction abnormality of the LV apex with preserved overall LVEF estimated at 50 to 55%  Recommendations: With ostial LAD involvement, recommend emergency coronary bypass grafting.  Cardiac surgical consultation is placed.    Patient Profile     62 y.o. male presenting initially with non-ST elevation infarction, then evolving  into anterior STEMI while on IV heparin.  Patient taken for emergency cardiac catheterization demonstrating critical stenosis at the ostium of the LAD and subtotal stenosis of the mid vessel.  Assessment & Plan    1.  Acute STEMI involving the LAD 2.  Type 2 diabetes 3.  Mixed hyperlipidemia  The patient is doing very well in the early postoperative period after emergency CABG with a LIMA to LAD and vein graft to diagonal.  He is atrial paced at 90 bpm with a stable rhythm.  Appreciate the care of Dr. Roxy Manns in the [facility] team.  Will follow.  For questions or updates, please contact [place] Please consult [email or web address] for contact info under        Signed, Sherren Mocha, MD  04/13/2018, 9:18 AM

## 2018-04-13 NOTE — Progress Notes (Signed)
This note also relates to the following rows which could not be included: SpO2 - Cannot attach notes to unvalidated device data  Patient switched to CPAP/PS with no issues. Will check ABG results and do final steps prior to extubating.

## 2018-04-13 NOTE — Progress Notes (Signed)
This note also relates to the following rows which could not be included: SpO2 - Cannot attach notes to unvalidated device data  Decreased Vent settings per protocol for weaning of open heart patient, but with High Tidal Volumes unable to get to 4 rate, so patient is currently at 40% and 5 rate.

## 2018-04-14 ENCOUNTER — Inpatient Hospital Stay (HOSPITAL_COMMUNITY): Payer: BLUE CROSS/BLUE SHIELD

## 2018-04-14 LAB — CBC
HCT: 35.7 % — ABNORMAL LOW (ref 39.0–52.0)
Hemoglobin: 11.1 g/dL — ABNORMAL LOW (ref 13.0–17.0)
MCH: 29 pg (ref 26.0–34.0)
MCHC: 31.1 g/dL (ref 30.0–36.0)
MCV: 93.2 fL (ref 80.0–100.0)
Platelets: 208 10*3/uL (ref 150–400)
RBC: 3.83 MIL/uL — ABNORMAL LOW (ref 4.22–5.81)
RDW: 13.2 % (ref 11.5–15.5)
WBC: 12.9 10*3/uL — ABNORMAL HIGH (ref 4.0–10.5)
nRBC: 0 % (ref 0.0–0.2)

## 2018-04-14 LAB — GLUCOSE, CAPILLARY
Glucose-Capillary: 122 mg/dL — ABNORMAL HIGH (ref 70–99)
Glucose-Capillary: 116 mg/dL — ABNORMAL HIGH (ref 70–99)
Glucose-Capillary: 136 mg/dL — ABNORMAL HIGH (ref 70–99)
Glucose-Capillary: 97 mg/dL (ref 70–99)
Glucose-Capillary: 171 mg/dL — ABNORMAL HIGH (ref 70–99)

## 2018-04-14 LAB — BASIC METABOLIC PANEL
Anion gap: 9 (ref 5–15)
BUN: 12 mg/dL (ref 8–23)
CO2: 24 mmol/L (ref 22–32)
Calcium: 8.2 mg/dL — ABNORMAL LOW (ref 8.9–10.3)
Chloride: 103 mmol/L (ref 98–111)
Creatinine, Ser: 1.01 mg/dL (ref 0.61–1.24)
GFR calc Af Amer: >60 mL/min (ref >60)
GFR calc non Af Amer: >60 mL/min (ref >60)
Glucose, Bld: 128 mg/dL — ABNORMAL HIGH (ref 70–99)
Potassium: 3.8 mmol/L (ref 3.5–5.1)
Sodium: 136 mmol/L (ref 135–145)

## 2018-04-14 MED ORDER — FUROSEMIDE 10 MG/ML IJ SOLN
20.0000 mg | Freq: Four times a day (QID) | INTRAMUSCULAR | Status: AC
  Administered 2018-04-14 (×3): 20 mg via INTRAVENOUS
  Filled 2018-04-14 (×3): qty 2

## 2018-04-14 MED ORDER — FUROSEMIDE 40 MG PO TABS
40.0000 mg | ORAL_TABLET | Freq: Two times a day (BID) | ORAL | Status: DC
  Administered 2018-04-15 – 2018-04-16 (×3): 40 mg via ORAL
  Filled 2018-04-14 (×3): qty 1

## 2018-04-14 MED ORDER — INSULIN ASPART 100 UNIT/ML ~~LOC~~ SOLN
0.0000 [IU] | Freq: Three times a day (TID) | SUBCUTANEOUS | Status: DC
  Administered 2018-04-14: 4 [IU] via SUBCUTANEOUS
  Administered 2018-04-14 – 2018-04-15 (×3): 2 [IU] via SUBCUTANEOUS
  Administered 2018-04-15: 3 [IU] via SUBCUTANEOUS
  Administered 2018-04-16: 4 [IU] via SUBCUTANEOUS

## 2018-04-14 MED ORDER — MOVING RIGHT ALONG BOOK
Freq: Once | Status: AC
  Administered 2018-04-14: 10:00:00
  Filled 2018-04-14: qty 1

## 2018-04-14 MED ORDER — POTASSIUM CHLORIDE 10 MEQ/50ML IV SOLN
10.0000 meq | INTRAVENOUS | Status: AC
  Administered 2018-04-14 (×3): 10 meq via INTRAVENOUS
  Filled 2018-04-14 (×3): qty 50

## 2018-04-14 MED ORDER — POTASSIUM CHLORIDE CRYS ER 20 MEQ PO TBCR: 20.0000 meq | EXTENDED_RELEASE_TABLET | Freq: Two times a day (BID) | ORAL | Status: DC

## 2018-04-14 MED FILL — Mannitol IV Soln 20%: INTRAVENOUS | Qty: 500 | Status: AC

## 2018-04-14 MED FILL — Electrolyte-R (PH 7.4) Solution: INTRAVENOUS | Qty: 3000 | Status: AC

## 2018-04-14 MED FILL — Sodium Chloride IV Soln 0.9%: INTRAVENOUS | Qty: 2000 | Status: AC

## 2018-04-14 MED FILL — Lidocaine HCl(Cardiac) IV PF Soln Pref Syr 100 MG/5ML (2%): INTRAVENOUS | Qty: 5 | Status: AC

## 2018-04-14 MED FILL — Heparin Sodium (Porcine) Inj 1000 Unit/ML: INTRAMUSCULAR | Qty: 10 | Status: AC

## 2018-04-14 MED FILL — Sodium Bicarbonate IV Soln 8.4%: INTRAVENOUS | Qty: 50 | Status: AC

## 2018-04-14 NOTE — Progress Notes (Signed)
Patient transferred to 21E04 with receiving RN and NT at bedside. Patient's belongings and son at bedside.

## 2018-04-14 NOTE — Progress Notes (Signed)
[address]       ,[place] [address]             [phone]      2 Days Post-Op Procedure(s) (LRB): CORONARY ARTERY BYPASS GRAFTING (CABG) x two , using left internal mammary artery and right leg greater saphenous vein harvested endoscopically (N/A) TRANSESOPHAGEAL ECHOCARDIOGRAM (TEE) (N/A) Subjective: Feels pretty well, ready for transfer to 4E  Objective: Vital signs in last 24 hours: Temp:  [97.9 F (36.6 C)-99 F (37.2 C)] 97.9 F (36.6 C) (12/17 0700) Pulse Rate:  [71-89] 79 (12/17 0700) Cardiac Rhythm: Normal sinus rhythm (12/16 2000) Resp:  [11-27] 21 (12/17 0700) BP: (96-121)/(56-70) 99/57 (12/17 0700) SpO2:  [93 %-98 %] 94 % (12/17 0700) Arterial Line BP: (99)/(55) 99/55 (12/16 0900) Weight:  [115.8 kg] 115.8 kg (12/17 0500)  Hemodynamic parameters for last 24 hours: PAP: (27)/(17) 27/17  Intake/Output from previous day: 12/16 0701 - 12/17 0700 In: 1816.3 [P.O.:720; I.V.:680.4; IV Piggyback:415.9] Out: 1140 [Urine:780; Chest Tube:360] Intake/Output this shift: No intake/output data recorded.  General appearance: alert, cooperative and no distress Heart: regular rate and rhythm Lungs: some basilar crackles Abdomen: benign Extrem: some upper and lower edema Incis: EVH site healing well, chest dressing in place Lab Results: Recent Labs    04/13/18 1620 04/14/18 0412  WBC 12.8* 12.9*  HGB 11.3*  11.9* 11.1*  HCT 36.2*  35.0* 35.7*  PLT 218 208   BMET:  Recent Labs    04/13/18 0415 04/13/18 1620 04/14/18 0412  NA 139 137 136  K 4.0 4.1 3.8  CL 108 104 103  CO2 20*  --  24  GLUCOSE 125* 148* 128*  BUN 8 10 12   CREATININE 0.79 0.87  0.90 1.01  CALCIUM 8.0*  --  8.2*    PT/INR:  Recent Labs    04/12/18 2318  LABPROT 15.1  INR 1.21   ABG    Component Value Date/Time   PHART 7.339 (L) 04/13/2018 0522   HCO3 19.7 (L) 04/13/2018 0522   TCO2 25 04/13/2018 1620   ACIDBASEDEF 5.0 (H) 04/13/2018 0522   O2SAT 89.0  04/13/2018 0522   CBG (last 3)  Recent Labs    04/13/18 1920 04/13/18 2332 04/14/18 0324  GLUCAP 156* 125* 122*    Meds Scheduled Meds: . acetaminophen  1,000 mg Oral Q6H  . aspirin EC  81 mg Oral Daily  . bisacodyl  10 mg Oral Daily   Or  . bisacodyl  10 mg Rectal Daily  . chlorhexidine  15 mL Mouth Rinse BID  . Chlorhexidine Gluconate Cloth  6 each Topical Daily  . clopidogrel  75 mg Oral Daily  . docusate sodium  200 mg Oral Daily  . enoxaparin (LOVENOX) injection  30 mg Subcutaneous QHS  . furosemide  20 mg Intravenous Q6H  . [START ON 04/15/2018] furosemide  40 mg Oral BID  . insulin aspart  0-24 Units Subcutaneous TID AC & HS  . mouth rinse  15 mL Mouth Rinse q12n4p  . metoprolol tartrate  12.5 mg Oral BID  . moving right along book   Does not apply Once  . pantoprazole  40 mg Oral Daily  . [START ON 04/15/2018] potassium chloride  20 mEq Oral BID  . rosuvastatin  10 mg Oral q1800  . sodium chloride flush  10 mL Intravenous Q12H  . sodium chloride flush  10-40 mL Intracatheter Q12H  . sodium chloride flush  3 mL Intravenous  Q12H  . tobramycin  2 drop Right Eye Q6H   Continuous Infusions: . sodium chloride    . cefUROXime (ZINACEF)  IV Stopped (04/14/18 0437)  . lactated ringers    . potassium chloride     PRN Meds:.metoprolol tartrate, morphine injection, ondansetron (ZOFRAN) IV, oxyCODONE, sodium chloride flush, sodium chloride flush, sodium chloride flush, traMADol, traZODone  Xrays Dg Chest Port 1 View  Result Date: 04/13/2018 CLINICAL DATA:  Extubation. Recent coronary artery bypass grafting. Chest pain. EXAM: PORTABLE CHEST 1 VIEW COMPARISON:  April 12, 2018 FINDINGS: Endotracheal tube and nasogastric tube have been removed. Swan-Ganz catheter tip is in the proximal right main pulmonary artery. There is a left chest tube and a mediastinal drain. No pneumothorax evident. There is patchy atelectatic change in both lower lobes. The lungs elsewhere are  clear. Heart is upper normal in size with pulmonary vascularity normal. No adenopathy. No bone lesions. IMPRESSION: Tube and catheter positions as described without pneumothorax. Bilateral lower lobe atelectatic change. No frank consolidation. Stable cardiac silhouette. Electronically Signed   By: Lowella Grip III M.D.   On: 04/13/2018 07:13   Dg Chest Port 1 View  Result Date: 04/12/2018 CLINICAL DATA:  Post intubation EXAM: PORTABLE CHEST 1 VIEW COMPARISON:  04/12/2018 FINDINGS: Endotracheal tube is 7 cm above the carina. Swan-Ganz catheter tip in the central right pulmonary artery. Left chest tube in place, no pneumothorax. Changes of CABG. Minimal right base atelectasis. No visible effusions. IMPRESSION: Changes of CABG.  Support devices as above.  No pneumothorax. Right base atelectasis. Electronically Signed   By: Rolm Baptise M.D.   On: 04/12/2018 23:36    Assessment/Plan: S/P Procedure(s) (LRB): CORONARY ARTERY BYPASS GRAFTING (CABG) x two , using left internal mammary artery and right leg greater saphenous vein harvested endoscopically (N/A) TRANSESOPHAGEAL ECHOCARDIOGRAM (TEE) (N/A)  POD#2 1 doing well,  2 hemodyn stable with BP in 90's, very short burst of supravent tachy, o/w sinus 3 sats good on RA 4 mild volume overload, po lasix ordered, normal renal fxn 5 remove lines/tubes 6 minor ABL anemia 7 BS ok control. A1C 6.2- on glucophage preop 8 routine pulm toilet/cardiac rehab 9 Tx to 4E  LOS: 2 days    [name] [facility] 04/14/2018 Pager [phone]

## 2018-04-14 NOTE — Progress Notes (Signed)
Progress Note  Patient Name: Ronzell Laban Date of Encounter: 04/14/2018  Primary Cardiologist: No primary care provider on file.   Subjective   Chest wall soreness. No dyspnea.   Inpatient Medications    Scheduled Meds: . acetaminophen  1,000 mg Oral Q6H  . aspirin EC  81 mg Oral Daily  . bisacodyl  10 mg Oral Daily   Or  . bisacodyl  10 mg Rectal Daily  . chlorhexidine  15 mL Mouth Rinse BID  . Chlorhexidine Gluconate Cloth  6 each Topical Daily  . clopidogrel  75 mg Oral Daily  . docusate sodium  200 mg Oral Daily  . enoxaparin (LOVENOX) injection  30 mg Subcutaneous QHS  . furosemide  20 mg Intravenous Q6H  . [START ON 04/15/2018] furosemide  40 mg Oral BID  . insulin aspart  0-24 Units Subcutaneous TID AC & HS  . mouth rinse  15 mL Mouth Rinse q12n4p  . metoprolol tartrate  12.5 mg Oral BID  . moving right along book   Does not apply Once  . pantoprazole  40 mg Oral Daily  . [START ON 04/15/2018] potassium chloride  20 mEq Oral BID  . rosuvastatin  10 mg Oral q1800  . sodium chloride flush  10 mL Intravenous Q12H  . sodium chloride flush  10-40 mL Intracatheter Q12H  . sodium chloride flush  3 mL Intravenous Q12H  . tobramycin  2 drop Right Eye Q6H   Continuous Infusions: . sodium chloride    . cefUROXime (ZINACEF)  IV Stopped (04/14/18 0437)  . lactated ringers    . potassium chloride 10 mEq (04/14/18 0813)   PRN Meds: metoprolol tartrate, morphine injection, ondansetron (ZOFRAN) IV, oxyCODONE, sodium chloride flush, sodium chloride flush, sodium chloride flush, traMADol, traZODone   Vital Signs    Vitals:   04/14/18 0500 04/14/18 0600 04/14/18 0700 04/14/18 0800  BP: (!) 98/56 112/67 (!) 99/57 (!) 112/59  Pulse: 75 75 79 75  Resp: (!) 27 (!) 21 (!) 21 18  Temp:   97.9 F (36.6 C)   TempSrc:   Oral   SpO2: 96% 95% 94% 95%  Weight: 115.8 kg     Height:        Intake/Output Summary (Last 24 hours) at 04/14/2018 0852 Last data filed at 04/14/2018  0813 Gross per 24 hour  Intake 1597.9 ml  Output 1110 ml  Net 487.9 ml   Filed Weights   04/13/18 0000 04/13/18 0645 04/14/18 0500  Weight: 111.7 kg 114.6 kg 115.8 kg    Telemetry    Sinus, PVCs, short run SVT- Personally Reviewed  ECG     No am ekg- Personally Reviewed  Physical Exam    General: Well developed, well nourished, NAD  HEENT: OP clear, mucus membranes moist  SKIN: warm, dry. No rashes. Neuro: No focal deficits  Musculoskeletal: Muscle strength 5/5 all ext  Psychiatric: Mood and affect normal  Neck: No JVD, no carotid bruits, no thyromegaly, no lymphadenopathy.  Lungs:Clear bilaterally, no wheezes, rhonci, crackles Cardiovascular: Regular rate and rhythm. No murmurs, gallops or rubs. Abdomen:Soft. Bowel sounds present. Non-tender.  Extremities: No lower extremity edema. Pulses are 2 + in the bilateral DP/PT.    Labs    Chemistry Recent Labs  Lab 04/12/18 1847  04/13/18 0415 04/13/18 1620 04/14/18 0412  NA 131*   < > 139 137 136  K 3.2*   < > 4.0 4.1 3.8  CL 99   < > 108 104 103  CO2 22  --  20*  --  24  GLUCOSE 113*   < > 125* 148* 128*  BUN 12   < > 8 10 12   CREATININE 0.85   < > 0.79 0.87  0.90 1.01  CALCIUM 7.6*  --  8.0*  --  8.2*  PROT 5.9*  --   --   --   --   ALBUMIN 3.0*  --   --   --   --   AST 34  --   --   --   --   ALT 32  --   --   --   --   ALKPHOS 66  --   --   --   --   BILITOT 1.0  --   --   --   --   GFRNONAA >60  --  >60 >60 >60  GFRAA >60  --  >60 >60 >60  ANIONGAP 10  --  11  --  9   < > = values in this interval not displayed.     Hematology Recent Labs  Lab 04/13/18 0415 04/13/18 1620 04/14/18 0412  WBC 12.1* 12.8* 12.9*  RBC 4.01* 3.87* 3.83*  HGB 11.4* 11.3*  11.9* 11.1*  HCT 37.0* 36.2*  35.0* 35.7*  MCV 92.3 93.5 93.2  MCH 28.4 29.2 29.0  MCHC 30.8 31.2 31.1  RDW 13.0 13.3 13.2  PLT 233 218 208    Cardiac Enzymes Recent Labs  Lab 04/12/18 0642 04/12/18 1030 04/12/18 1512 04/12/18 1847    TROPONINI 0.21* 0.77* 1.68* 1.07*    Recent Labs  Lab 04/12/18 0612  TROPIPOC 0.14*     BNPNo results for input(s): BNP, PROBNP in the last 168 hours.   DDimer No results for input(s): DDIMER in the last 168 hours.   Radiology    Dg Chest Port 1 View  Result Date: 04/14/2018 CLINICAL DATA:  Chest tube, follow-up post CABG EXAM: PORTABLE CHEST 1 VIEW COMPARISON:  Portable chest x-ray of 04/13/2018 FINDINGS: The Swan-Ganz catheter has been removed and a venous sheath remains in the upper SVC. The left chest tube remains with only a tiny left pneumothorax present. Mild volume loss at the left lung base remains and cardiomegaly is stable. Median sternotomy sutures are noted and mediastinal drain is present. IMPRESSION: 1. Swan-Ganz catheter removed with venous sheath remaining. 2. Left chest tube remains with only a tiny left apical pneumothorax present. Electronically Signed   By: Ivar Drape M.D.   On: 04/14/2018 08:36   Dg Chest Port 1 View  Result Date: 04/13/2018 CLINICAL DATA:  Extubation. Recent coronary artery bypass grafting. Chest pain. EXAM: PORTABLE CHEST 1 VIEW COMPARISON:  April 12, 2018 FINDINGS: Endotracheal tube and nasogastric tube have been removed. Swan-Ganz catheter tip is in the proximal right main pulmonary artery. There is a left chest tube and a mediastinal drain. No pneumothorax evident. There is patchy atelectatic change in both lower lobes. The lungs elsewhere are clear. Heart is upper normal in size with pulmonary vascularity normal. No adenopathy. No bone lesions. IMPRESSION: Tube and catheter positions as described without pneumothorax. Bilateral lower lobe atelectatic change. No frank consolidation. Stable cardiac silhouette. Electronically Signed   By: Lowella Grip III M.D.   On: 04/13/2018 07:13   Dg Chest Port 1 View  Result Date: 04/12/2018 CLINICAL DATA:  Post intubation EXAM: PORTABLE CHEST 1 VIEW COMPARISON:  04/12/2018 FINDINGS: Endotracheal  tube is 7 cm above the carina. Swan-Ganz catheter tip  in the central right pulmonary artery. Left chest tube in place, no pneumothorax. Changes of CABG. Minimal right base atelectasis. No visible effusions. IMPRESSION: Changes of CABG.  Support devices as above.  No pneumothorax. Right base atelectasis. Electronically Signed   By: Rolm Baptise M.D.   On: 04/12/2018 23:36    Cardiac Studies   2D echocardiogram: Study Conclusions  - Left ventricle: The cavity size was normal. Wall thickness was   normal. Systolic function was normal. The estimated ejection   fraction was in the range of 55% to 60%. Diffuse hypokinesis.   Doppler parameters are consistent with abnormal left ventricular   relaxation (grade 1 diastolic dysfunction).  Cardiac catheterization: Conclusion     Dist LM to Ost LAD lesion is 90% stenosed.  Prox LAD to Mid LAD lesion is 95% stenosed.  There is mild left ventricular systolic dysfunction.  LV end diastolic pressure is normal.  The left ventricular ejection fraction is 50-55% by visual estimate.   1.  Severe single-vessel coronary artery disease involving the ostium and the midportion of the LAD which correlates with the patient's acute injury current on EKG 2.  Patent left mainstem, left circumflex, and RCA with mild nonobstructive stenosis 3.  Mild contraction abnormality of the LV apex with preserved overall LVEF estimated at 50 to 55%  Recommendations: With ostial LAD involvement, recommend emergency coronary bypass grafting.  Cardiac surgical consultation is placed.    Patient Profile     62 y.o. male presenting initially with non-ST elevation infarction, then evolving into anterior STEMI while on IV heparin.  Patient taken for emergency cardiac catheterization demonstrating critical stenosis at the ostium of the LAD and subtotal stenosis of the mid vessel.  Assessment & Plan    1.  CAD/Acute anterior STEMI: Pt is post op day 2 following 2 V CABG  emergently for distal left main stenosis. He is doing well. BP is stable. Continue ASA, Plavix, statin and beta blocker.   2.  Post op volume overload: continue diuresis with IV Lasix.   For questions or updates, please contact [place] Please consult [email or web address] for contact info under        Signed, Lauree Chandler, MD  04/14/2018, 8:52 AM

## 2018-04-14 NOTE — Progress Notes (Signed)
Pt received from [facility]. CHG bath given. Telebox 04 applied. CCMD notified. Vitals stable. Pt denies any pain. Will continue to monitor. Jerald Kief, RN

## 2018-04-14 NOTE — Plan of Care (Signed)
  Problem: Education: Goal: Will demonstrate proper wound care and an understanding of methods to prevent future damage Outcome: Progressing Goal: Knowledge of disease or condition will improve Outcome: Progressing   Problem: Cardiac: Goal: Will achieve and/or maintain hemodynamic stability Outcome: Progressing   Problem: Clinical Measurements: Goal: Postoperative complications will be avoided or minimized Outcome: Progressing   

## 2018-04-15 ENCOUNTER — Inpatient Hospital Stay (HOSPITAL_COMMUNITY): Payer: BLUE CROSS/BLUE SHIELD

## 2018-04-15 ENCOUNTER — Telehealth: Payer: Self-pay

## 2018-04-15 DIAGNOSIS — Z951 Presence of aortocoronary bypass graft: Secondary | ICD-10-CM

## 2018-04-15 LAB — CBC
HCT: 35 % — ABNORMAL LOW (ref 39.0–52.0)
Hemoglobin: 11.6 g/dL — ABNORMAL LOW (ref 13.0–17.0)
MCH: 30.1 pg (ref 26.0–34.0)
MCHC: 33.1 g/dL (ref 30.0–36.0)
MCV: 90.9 fL (ref 80.0–100.0)
Platelets: 222 10*3/uL (ref 150–400)
RBC: 3.85 MIL/uL — ABNORMAL LOW (ref 4.22–5.81)
RDW: 13.2 % (ref 11.5–15.5)
WBC: 13.9 10*3/uL — ABNORMAL HIGH (ref 4.0–10.5)
nRBC: 0 % (ref 0.0–0.2)

## 2018-04-15 LAB — GLUCOSE, CAPILLARY
Glucose-Capillary: 104 mg/dL — ABNORMAL HIGH (ref 70–99)
Glucose-Capillary: 129 mg/dL — ABNORMAL HIGH (ref 70–99)
Glucose-Capillary: 134 mg/dL — ABNORMAL HIGH (ref 70–99)
Glucose-Capillary: 131 mg/dL — ABNORMAL HIGH (ref 70–99)

## 2018-04-15 LAB — BASIC METABOLIC PANEL
Anion gap: 10 (ref 5–15)
BUN: 9 mg/dL (ref 8–23)
CO2: 27 mmol/L (ref 22–32)
Calcium: 8.3 mg/dL — ABNORMAL LOW (ref 8.9–10.3)
Chloride: 100 mmol/L (ref 98–111)
Creatinine, Ser: 1.12 mg/dL (ref 0.61–1.24)
GFR calc Af Amer: >60 mL/min (ref >60)
GFR calc non Af Amer: >60 mL/min (ref >60)
Glucose, Bld: 131 mg/dL — ABNORMAL HIGH (ref 70–99)
Potassium: 3.5 mmol/L (ref 3.5–5.1)
Sodium: 137 mmol/L (ref 135–145)

## 2018-04-15 MED ORDER — POTASSIUM CHLORIDE CRYS ER 20 MEQ PO TBCR
40.0000 meq | EXTENDED_RELEASE_TABLET | Freq: Two times a day (BID) | ORAL | Status: AC
  Administered 2018-04-15 (×2): 40 meq via ORAL
  Filled 2018-04-15 (×2): qty 2

## 2018-04-15 MED ORDER — POTASSIUM CHLORIDE CRYS ER 20 MEQ PO TBCR
40.0000 meq | EXTENDED_RELEASE_TABLET | Freq: Once | ORAL | Status: AC
  Administered 2018-04-15: 40 meq via ORAL
  Filled 2018-04-15: qty 2

## 2018-04-15 NOTE — Discharge Summary (Signed)
Physician Discharge Summary  Patient ID: [name] MRN: [id number] DOB/AGE: [birth year] 62 y.o.  Admit date: 04/12/2018 Discharge date: 04/16/2018  Admission Diagnoses:  Patient Active Problem List   Diagnosis Date Noted  . STEMI involving left anterior descending coronary artery ([place]) 04/12/2018  . Cough 03/30/2018  . Actinic keratoses 09/13/2016  . Bilateral hand pain 05/30/2016  . Neuropathy ([place]) 01/26/2016  . RBBB 09/02/2014  . CAD (coronary artery disease)   . Routine general medical examination at a health care facility 11/29/2011  . Prediabetes 12/02/2008  . Essential hypertension, benign 05/16/2007  . DIVERTICULOSIS, COLON 05/16/2007  . COLONIC POLYPS, HYPERPLASTIC 04/03/2007  . Hyperlipemia 11/25/2006  . GERD 11/25/2006  . 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 Sleep disturbance 11/25/2006   Discharge Diagnoses:   Patient Active Problem List   Diagnosis Date Noted  . STEMI involving left anterior descending coronary artery ([place]) 04/12/2018  . 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  . Cough 03/30/2018  . Actinic keratoses 09/13/2016  . Bilateral hand pain 05/30/2016  . Neuropathy ([place]) 01/26/2016  . RBBB 09/02/2014  . CAD (coronary artery disease)   . Routine general medical examination at a health care facility 11/29/2011  . Prediabetes 12/02/2008  . Essential hypertension, benign 05/16/2007  . DIVERTICULOSIS, COLON 05/16/2007  . COLONIC POLYPS, HYPERPLASTIC 04/03/2007  . Hyperlipemia 11/25/2006  . GERD 11/25/2006  . 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 Sleep disturbance 11/25/2006   Discharged Condition: good  History of Present Illness:  [name] is a 62 yo white male with known history of coronary artery disease, hypertension, hyperlipidemia, type 2 diabetes mellitus, and GE reflux disease.  His cardiac history dates back to 2007 when he was evaluated for symptoms of chest pain.  Catheterization at that time reportedly demonstrated moderate  single-vessel coronary artery disease with 50 to 70% stenosis of the left anterior descending coronary artery and 40% stenosis of the left circumflex.  Medical therapy was recommended.  Over the last several months the patient has experienced substernal chest discomfort with exertion typically relieved by rest.  Several days ago the patient began to develop substernal chest pain while walking to his car.  Symptoms subsided after several minutes.  The morning of 12/15  the patient awoke with similar chest discomfort which persisted.  He presented to the emergency department where symptoms had subsided by the time of his initial evaluation.  Chest pain resolved with aspirin and nitroglycerin.  Baseline EKG revealed sinus rhythm without acute changes.  Troponin was positive, consistent with acute coronary syndrome and non-ST segment elevation myocardial infarction.  The patient was admitted to the hospital and anticoagulated using aspirin, Plavix, and intravenous heparin.  This afternoon the patient developed increased chest pain associated with marked ST segment elevation across the anterior leads.  Code STEMI was called and the patient was brought directly to the Cath Lab by Dr. Burt Knack.  Findings at the time of catheterization revealed critical 90% ostial stenosis of the left anterior descending coronary artery followed by 95% stenosis of mid left anterior descending coronary artery with what appears to be ruptured plaque.  There is insignificant disease in the left circumflex and right coronary territories.  Left ventricular systolic function appears preserved.  LVEDP was measured 19 mmHg.  [name] of ongoing chest pain while in the Cath Lab.  He was treated with IV NTG and high dose heparin which did relieve his symptoms.  It was felt emergent coronary bypass grafting would be  indicated and TCTS consult was obtained.  He was evaluated by Dr. Roxy Manns who was in agreement emergency bypass  surgery would be indicated.  The risks and benefits of the procedure were explained to the patient and he was agreeable to proceed.  Hospital Course:   [name] was taken emergently to the operating room.  He underwent CABG x 2 utilizing LIMA to LAD and SVG to Diagonal.  He also underwent endoscopic harvest of greater saphenous vein from right thigh.  He tolerated the procedure without difficulty and was taken to the SICU in stable condition.  He was extubated the evening of surgery.  During his stay in the SICU the patient's chest tubes and arterial lines were removed without difficulty.  He was started on DAPT.  He has had previous issues with statin therapy.  He has not ever taken Crestor and was willing to try.  The patient was maintaining NSR and was stable for transfer to the telemetry unit on 04/14/2018. He continues to make good progress.  He remains in NSR with occasional PVC.  He continues to tolerate gentle diuretics for mild hypervolemia.  His pacing wires were removed without difficulty.  He is ambulating without difficulty.  His incisions are healing without evidence of infection.  He is tolerating a heart healthy diet.  He is medically stable for discharge home today.   Significant Diagnostic Studies: angiography:    Dist LM to Ost LAD lesion is 90% stenosed.  Prox LAD to Mid LAD lesion is 95% stenosed.  There is mild left ventricular systolic dysfunction.  LV end diastolic pressure is normal.  The left ventricular ejection fraction is 50-55% by visual estimate.   1.  Severe single-vessel coronary artery disease involving the ostium and the midportion of the LAD which correlates with the patient's acute injury current on EKG 2.  Patent left mainstem, left circumflex, and RCA with mild nonobstructive stenosis 3.  Mild contraction abnormality of the LV apex with preserved overall LVEF estimated at 50 to 55%  Treatments: surgery:    Coronary Artery Bypass Grafting x 2               Left Internal Mammary Artery to Distal Left Anterior Descending Coronary Artery             Sapheonous Vein Graft to First Diagonal Branch Coronary Artery             Endoscopic Vein Harvest from Right Thigh  Discharge Exam: Blood pressure 115/71, pulse 82, temperature 98.4 F (36.9 C), temperature source Oral, resp. rate 17, height 5\' 10"  (1.778 m), weight 111.3 kg, SpO2 96 %.    Disposition: Discharge disposition: 01-Home or Self Care     Home  Discharge Medications:  The patient has been discharged on:   1.Beta Blocker:  Yes [ X  ]                              No   [   ]                              If No, reason:  2.Ace Inhibitor/ARB: Yes [   ]  No  n    ]                                     If No, reason:soft BP  3.Statin:   Yes [ X  ]                  No  [   ]                  If No, reason:  4.Ecasa:  Yes  [ X ]                  No   [   ]                  If No, reason:     Discharge Instructions    Discharge patient   Complete by:  As directed    Discharge disposition:  01-Home or Self Care   Discharge patient date:  04/16/2018     Allergies as of 04/16/2018      Reactions   Atorvastatin    REACTION: muscle aches   Simvastatin    REACTION: hand pain   Pravachol [pravastatin Sodium]    Muscle aching      Medication List    STOP taking these medications   azithromycin 250 MG tablet Commonly known as:  ZITHROMAX Z-PAK   benzonatate 100 MG capsule Commonly known as:  TESSALON   diclofenac 75 MG EC tablet Commonly known as:  VOLTAREN   fluticasone 50 MCG/ACT nasal spray Commonly known as:  FLONASE   HYDROcodone-homatropine 5-1.5 MG/5ML syrup Commonly known as:  HYCODAN   nitroGLYCERIN 0.4 MG SL tablet Commonly known as:  NITROSTAT   traZODone 100 MG tablet Commonly known as:  DESYREL     TAKE these medications   aspirin EC 81 MG tablet Take 81 mg by mouth daily.   clopidogrel 75 MG  tablet Commonly known as:  PLAVIX Take 1 tablet (75 mg total) by mouth daily.   metFORMIN 500 MG 24 hr tablet Commonly known as:  GLUCOPHAGE-XR Take 1 tablet (500 mg total) by mouth daily with breakfast.   metoprolol tartrate 25 MG tablet Commonly known as:  LOPRESSOR Take 0.5 tablets (12.5 mg total) by mouth 2 (two) times daily. What changed:    medication strength  how much to take  when to take this   multivitamin tablet Take 1 tablet by mouth daily.   OMEGA-3 FISH OIL PO Take 1,000 mg by mouth daily.   omeprazole 20 MG capsule Commonly known as:  PRILOSEC TAKE ONE CAPSULE BY MOUTH DAILY   oxyCODONE 5 MG immediate release tablet Commonly known as:  Oxy IR/ROXICODONE Take 1-2 tablets (5-10 mg total) by mouth every 6 (six) hours as needed for up to 7 days for severe pain.   rosuvastatin 10 MG tablet Commonly known as:  CRESTOR Take 1 tablet (10 mg total) by mouth daily at 6 PM.      Follow-up Information    [facility] [place] Follow up on 05/18/2018.   Specialty:  Cardiothoracic Surgery Why:  Appointment is at 1:00, please get CXR at 12:30 at [place] located on first floor of our office building Contact information: [place], [place], [place], NP Follow up  on 04/30/2018.   Specialties:  Nurse Practitioner, Radiology, Cardiology Why:  Appointment is at 10:00 Contact information: [address] [place] [address] [phone]           Signed: John Giovanni 04/16/2018, 8:43 AM

## 2018-04-15 NOTE — Progress Notes (Signed)
CARDIAC REHAB PHASE I   PRE:  Rate/Rhythm: 83 SR    BP: sitting 111/68    SaO2: 93 RA  MODE:  Ambulation: 750 ft   POST:  Rate/Rhythm: 103 ST    BP: sitting 127/72     SaO2: 95 RA  Pt with some nausea earlier but feeling better now. Able to get up and walk with RW. Steady, long distance. Walked last 11 ft without RW but didn't feel as secure. They will decide tomorrow if he needs a RW. To recliner. Encouraged more walking and IS. [id number]   [place], ACSM 04/15/2018 10:37 AM

## 2018-04-15 NOTE — Progress Notes (Addendum)
Progress Note  Patient Name: [name] Date of Encounter: 04/15/2018  Primary Cardiologist: Elouise Munroe, MD   Subjective   No significant overnight events. Patient reports chest soreness but denies any real chest pain. No shortness of breath. Patient has not had a bowel movement yet.   Inpatient Medications    Scheduled Meds: . acetaminophen  1,000 mg Oral Q6H  . aspirin EC  81 mg Oral Daily  . bisacodyl  10 mg Oral Daily   Or  . bisacodyl  10 mg Rectal Daily  . chlorhexidine  15 mL Mouth Rinse BID  . Chlorhexidine Gluconate Cloth  6 each Topical Daily  . clopidogrel  75 mg Oral Daily  . docusate sodium  200 mg Oral Daily  . enoxaparin (LOVENOX) injection  30 mg Subcutaneous QHS  . furosemide  40 mg Oral BID  . insulin aspart  0-24 Units Subcutaneous TID AC & HS  . mouth rinse  15 mL Mouth Rinse q12n4p  . metoprolol tartrate  12.5 mg Oral BID  . pantoprazole  40 mg Oral Daily  . potassium chloride  40 mEq Oral BID WC  . rosuvastatin  10 mg Oral q1800  . sodium chloride flush  10 mL Intravenous Q12H  . sodium chloride flush  10-40 mL Intracatheter Q12H  . sodium chloride flush  3 mL Intravenous Q12H  . tobramycin  2 drop Right Eye Q6H   Continuous Infusions: . sodium chloride    . lactated ringers     PRN Meds: metoprolol tartrate, morphine injection, ondansetron (ZOFRAN) IV, oxyCODONE, sodium chloride flush, sodium chloride flush, sodium chloride flush, traMADol, traZODone   Vital Signs    Vitals:   04/14/18 1900 04/14/18 2056 04/14/18 2309 04/15/18 0556  BP:  134/72 105/62 114/66  Pulse:  89 87 90  Resp: 20 20 19 18   Temp:  98.4 F (36.9 C) 98.6 F (37 C) 98.1 F (36.7 C)  TempSrc:  Oral Oral Oral  SpO2:  95% 95% 93%  Weight:    113.2 kg  Height:        Intake/Output Summary (Last 24 hours) at 04/15/2018 0754 Last data filed at 04/15/2018 0556 Gross per 24 hour  Intake 1134.23 ml  Output 2325 ml  Net -1190.77 ml   Filed Weights   04/13/18 0645 04/14/18 0500 04/15/18 0556  Weight: 114.6 kg 115.8 kg 113.2 kg    Telemetry    Sinus rhythm with heart rates in the 70's to low 100's. - Personally Reviewed  ECG    No new ECG tracings today. - Personally Reviewed  Physical Exam   GEN: 62 year old Caucasian male resting comfortably. Alert an in no acute distress.   Neck: Supple. Cardiac: RRR. No significant murmurs, rubs, or gallops.  Respiratory: No increased work of breathing. Mild crackles noted in bilateral bases. GI: Abdomen soft, non-tender, non-distended.  MS: No significant lower extremity edema. Skin: Warm and dry. Neuro:  No focal deficits. Psych: Normal affect. Responds appropriately.  Labs    Chemistry Recent Labs  Lab 04/12/18 1847  04/13/18 0415 04/13/18 1620 04/14/18 0412 04/15/18 0235  NA 131*   < > 139 137 136 137  K 3.2*   < > 4.0 4.1 3.8 3.5  CL 99   < > 108 104 103 100  CO2 22  --  20*  --  24 27  GLUCOSE 113*   < > 125* 148* 128* 131*  BUN 12   < > 8  10 12 9   CREATININE 0.85   < > 0.79 0.87  0.90 1.01 1.12  CALCIUM 7.6*  --  8.0*  --  8.2* 8.3*  PROT 5.9*  --   --   --   --   --   ALBUMIN 3.0*  --   --   --   --   --   AST 34  --   --   --   --   --   ALT 32  --   --   --   --   --   ALKPHOS 66  --   --   --   --   --   BILITOT 1.0  --   --   --   --   --   GFRNONAA >60  --  >60 >60 >60 >60  GFRAA >60  --  >60 >60 >60 >60  ANIONGAP 10  --  11  --  9 10   < > = values in this interval not displayed.     Hematology Recent Labs  Lab 04/13/18 1620 04/14/18 0412 04/15/18 0235  WBC 12.8* 12.9* 13.9*  RBC 3.87* 3.83* 3.85*  HGB 11.3*  11.9* 11.1* 11.6*  HCT 36.2*  35.0* 35.7* 35.0*  MCV 93.5 93.2 90.9  MCH 29.2 29.0 30.1  MCHC 31.2 31.1 33.1  RDW 13.3 13.2 13.2  PLT 218 208 222    Cardiac Enzymes Recent Labs  Lab 04/12/18 0642 04/12/18 1030 04/12/18 1512 04/12/18 1847  TROPONINI 0.21* 0.77* 1.68* 1.07*    Recent Labs  Lab 04/12/18 0612  TROPIPOC 0.14*       BNPNo results for input(s): BNP, PROBNP in the last 168 hours.   DDimer No results for input(s): DDIMER in the last 168 hours.   Radiology    Dg Chest Port 1 View  Result Date: 04/14/2018 CLINICAL DATA:  Chest tube, follow-up post CABG EXAM: PORTABLE CHEST 1 VIEW COMPARISON:  Portable chest x-ray of 04/13/2018 FINDINGS: The Swan-Ganz catheter has been removed and a venous sheath remains in the upper SVC. The left chest tube remains with only a tiny left pneumothorax present. Mild volume loss at the left lung base remains and cardiomegaly is stable. Median sternotomy sutures are noted and mediastinal drain is present. IMPRESSION: 1. Swan-Ganz catheter removed with venous sheath remaining. 2. Left chest tube remains with only a tiny left apical pneumothorax present. Electronically Signed   By: Ivar Drape M.D.   On: 04/14/2018 08:36    Cardiac Studies   Left Heart Catheterization 04/12/2018:  Dist LM to Ost LAD lesion is 90% stenosed.  Prox LAD to Mid LAD lesion is 95% stenosed.  There is mild left ventricular systolic dysfunction.  LV end diastolic pressure is normal.  The left ventricular ejection fraction is 50-55% by visual estimate.   1.  Severe single-vessel coronary artery disease involving the ostium and the midportion of the LAD which correlates with the patient's acute injury current on EKG 2.  Patent left mainstem, left circumflex, and RCA with mild nonobstructive stenosis 3.  Mild contraction abnormality of the LV apex with preserved overall LVEF estimated at 50 to 55%  Recommendations: With ostial LAD involvement, recommend emergency coronary bypass grafting.  Cardiac surgical consultation is placed. _______________  Echocardiogram 04/12/2018: Study Conclusions: - Left ventricle: The cavity size was normal. Wall thickness was   normal. Systolic function was normal. The estimated ejection   fraction was in the range of 55% to  60%. Diffuse hypokinesis.   Doppler  parameters are consistent with abnormal left ventricular   relaxation (grade 1 diastolic dysfunction).  Patient Profile     [name] is a 62 year old male who presented to the [facility] ED on 04/11/2018 for evaluation of chest pain and was initially found to have an NSTEMI which then evolved into an anterior STEMI while on IV Heparin. Patient was taken for emergency cardiac catheterization which demonstrated critical stenosis at the ostium of the LAD and subtotal stenosis of the mid-vessel. Patient then underwent emergency CABG x2.   Assessment & Plan    Anterior STEMI - Patient is post-op day 3 following emergency CABG x2 for distal left main stenosis. - Patient notes chest soreness but denies any angina this morning. - Blood pressure continues to be stable. - Continue dual antiplatelet therapy with Aspirin and Plavix. - Continue statin and beta-blocker. Consider up-titrating Lopressor.  Post-Op Volume Overload - Echo showed normal systolic function with EF of 55-60%. - Continue gentle diuresis with IV Lasix. - Continue to monitor renal function and electrolytes during diuresis.   For questions or updates, please contact [place] Please consult [email or web address] for contact info under     Signed, Darreld Mclean, PA-C  04/15/2018, 7:54 AM    Patient seen, examined. Available data reviewed. Agree with findings, assessment, and plan as outlined by Sande Rives, PA-C.  The patient is up in the chair at this time.  His wife is present at the bedside.  He feels tired today but has no specific complaints.  Lungs are clear, heart is regular rate and rhythm with a grade 2/6 systolic ejection murmur.  Abdomen is soft and nontender.  Extremities have mild diffuse edema.  The patient continues to progress very well after CABG done emergently over the weekend.  He is on a good medical program with aspirin and clopidogrel, high intensity statin drug, and a beta-blocker.  He likely will be  discharged in the next 24 to 48 hours.  Will arrange outpatient follow-up for him.  Sherren Mocha, M.D. 04/15/2018 3:42 PM

## 2018-04-15 NOTE — Progress Notes (Signed)
[address]       [place] [address]             [phone]      3 Days Post-Op Procedure(s) (LRB): CORONARY ARTERY BYPASS GRAFTING (CABG) x two , using left internal mammary artery and right leg greater saphenous vein harvested endoscopically (N/A) TRANSESOPHAGEAL ECHOCARDIOGRAM (TEE) (N/A) Subjective: Feels well, some mild nausea after breakfast  Objective: Vital signs in last 24 hours: Temp:  [98.1 F (36.7 C)-98.6 F (37 C)] 98.2 F (36.8 C) (12/18 0800) Pulse Rate:  [73-90] 75 (12/18 0800) Cardiac Rhythm: Normal sinus rhythm (12/18 0430) Resp:  [15-24] 20 (12/18 0800) BP: (97-134)/(59-72) 114/69 (12/18 0800) SpO2:  [92 %-100 %] 98 % (12/18 0800) Weight:  [419.3 kg] 113.2 kg (12/18 0556)  Hemodynamic parameters for last 24 hours:    Intake/Output from previous day: 12/17 0701 - 12/18 0700 In: 1134.2 [P.O.:720; I.V.:60; IV Piggyback:354.2] Out: 2325 [Urine:2295; Chest Tube:30] Intake/Output this shift: No intake/output data recorded.  General appearance: alert, cooperative and no distress Heart: regular rate and rhythm and no rub or murmur Lungs: min dim in bases Abdomen: benign Extremities: minor edema Wound: incis healing well(evh) , chest incis healing well  Lab Results: Recent Labs    04/14/18 0412 04/15/18 0235  WBC 12.9* 13.9*  HGB 11.1* 11.6*  HCT 35.7* 35.0*  PLT 208 222   BMET:  Recent Labs    04/14/18 0412 04/15/18 0235  NA 136 137  K 3.8 3.5  CL 103 100  CO2 24 27  GLUCOSE 128* 131*  BUN 12 9  CREATININE 1.01 1.12  CALCIUM 8.2* 8.3*    PT/INR:  Recent Labs    04/12/18 2318  LABPROT 15.1  INR 1.21   ABG    Component Value Date/Time   PHART 7.339 (L) 04/13/2018 0522   HCO3 19.7 (L) 04/13/2018 0522   TCO2 25 04/13/2018 1620   ACIDBASEDEF 5.0 (H) 04/13/2018 0522   O2SAT 89.0 04/13/2018 0522   CBG (last 3)  Recent Labs    04/14/18 1706 04/14/18 2140 04/15/18 0624  GLUCAP 97 171* 104*     Meds Scheduled Meds: . acetaminophen  1,000 mg Oral Q6H  . aspirin EC  81 mg Oral Daily  . bisacodyl  10 mg Oral Daily   Or  . bisacodyl  10 mg Rectal Daily  . chlorhexidine  15 mL Mouth Rinse BID  . Chlorhexidine Gluconate Cloth  6 each Topical Daily  . clopidogrel  75 mg Oral Daily  . docusate sodium  200 mg Oral Daily  . enoxaparin (LOVENOX) injection  30 mg Subcutaneous QHS  . furosemide  40 mg Oral BID  . insulin aspart  0-24 Units Subcutaneous TID AC & HS  . mouth rinse  15 mL Mouth Rinse q12n4p  . metoprolol tartrate  12.5 mg Oral BID  . pantoprazole  40 mg Oral Daily  . potassium chloride  40 mEq Oral BID WC  . rosuvastatin  10 mg Oral q1800  . sodium chloride flush  10 mL Intravenous Q12H  . sodium chloride flush  10-40 mL Intracatheter Q12H  . sodium chloride flush  3 mL Intravenous Q12H  . tobramycin  2 drop Right Eye Q6H   Continuous Infusions: . sodium chloride    . lactated ringers     PRN Meds:.metoprolol tartrate, morphine injection, ondansetron (ZOFRAN) IV, oxyCODONE, sodium chloride flush, sodium chloride flush, sodium chloride flush, traMADol, traZODone  Xrays Dg  Chest 2 View  Result Date: 04/15/2018 CLINICAL DATA:  Atelectasis EXAM: CHEST - 2 VIEW COMPARISON:  04/14/2018 FINDINGS: CABG. Cardiomegaly. Bibasilar atelectasis and small effusions. Interval removal of left chest tube. No pneumothorax. IMPRESSION: Cardiomegaly.  Bibasilar atelectasis with small effusions. Electronically Signed   By: Rolm Baptise M.D.   On: 04/15/2018 08:03   Dg Chest Port 1 View  Result Date: 04/14/2018 CLINICAL DATA:  Chest tube, follow-up post CABG EXAM: PORTABLE CHEST 1 VIEW COMPARISON:  Portable chest x-ray of 04/13/2018 FINDINGS: The Swan-Ganz catheter has been removed and a venous sheath remains in the upper SVC. The left chest tube remains with only a tiny left pneumothorax present. Mild volume loss at the left lung base remains and cardiomegaly is stable. Median  sternotomy sutures are noted and mediastinal drain is present. IMPRESSION: 1. Swan-Ganz catheter removed with venous sheath remaining. 2. Left chest tube remains with only a tiny left apical pneumothorax present. Electronically Signed   By: Ivar Drape M.D.   On: 04/14/2018 08:36    Assessment/Plan: S/P Procedure(s) (LRB): CORONARY ARTERY BYPASS GRAFTING (CABG) x two , using left internal mammary artery and right leg greater saphenous vein harvested endoscopically (N/A) TRANSESOPHAGEAL ECHOCARDIOGRAM (TEE) (N/A)  1 doing well 2 sinus rhythm with occ pvc 3 BS well controlled 4 Leukocytosis mildly increased- no fevers, monitor 5 H/H stable 6 renal function stable, cont gentle diuresis 7 d/c epw's 8 cont routine pulm toilet and rehab Poss home in am   LOS: 3 days    John Giovanni [facility]  04/15/2018 Pager [phone]

## 2018-04-15 NOTE — Progress Notes (Signed)
EPW's removed per MD orders without difficulty.  Patient on bedrest for one hour with frequent vial sign checks.  Will continue to monitor.

## 2018-04-15 NOTE — Telephone Encounter (Signed)
Pt was in the ER 04-11-18 for a foreign object in his eye. He said that is better. But, he went back to the ER 04-14-18 for chest pain. He had a heart attack. Michela Pitcher he is feeling good. Appreciated the call.

## 2018-04-16 LAB — GLUCOSE, CAPILLARY
Glucose-Capillary: 118 mg/dL — ABNORMAL HIGH (ref 70–99)
Glucose-Capillary: 176 mg/dL — ABNORMAL HIGH (ref 70–99)
Glucose-Capillary: 143 mg/dL — ABNORMAL HIGH (ref 70–99)

## 2018-04-16 MED ORDER — CLOPIDOGREL BISULFATE 75 MG PO TABS: 75.0000 mg | ORAL_TABLET | Freq: Every day | ORAL | 1 refills | Status: DC

## 2018-04-16 MED ORDER — METOPROLOL TARTRATE 25 MG PO TABS: 12.5000 mg | ORAL_TABLET | Freq: Two times a day (BID) | ORAL | 1 refills | Status: DC

## 2018-04-16 MED ORDER — ROSUVASTATIN CALCIUM 10 MG PO TABS: 10.0000 mg | ORAL_TABLET | Freq: Every day | ORAL | 1 refills | Status: DC

## 2018-04-16 MED ORDER — OXYCODONE HCL 5 MG PO TABS: 5.0000 mg | ORAL_TABLET | Freq: Four times a day (QID) | ORAL | 0 refills | Status: AC | PRN

## 2018-04-16 MED ORDER — PANTOPRAZOLE SODIUM 40 MG PO TBEC: 40.0000 mg | DELAYED_RELEASE_TABLET | Freq: Every day | ORAL | 1 refills | Status: DC

## 2018-04-16 MED FILL — METOPROLOL TARTRATE 25 MG T: 25 | 30 days supply | Qty: 30 | Fill #0 | Status: TO

## 2018-04-16 MED FILL — CLOPIDOGREL 75 MG TABLET: 75 | 30 days supply | Qty: 30 | Fill #0 | Status: TO

## 2018-04-16 MED FILL — oxyCODONE HCL 5 MG TABS: 5 | 4 days supply | Qty: 30 | Fill #0

## 2018-04-16 MED FILL — ROSUVASTATIN CALCIUM 10 MG: 10 | 30 days supply | Qty: 30 | Fill #0 | Status: TO

## 2018-04-16 NOTE — Plan of Care (Signed)
  Problem: Education: Goal: Knowledge of disease or condition will improve Outcome: Progressing   Problem: Activity: Goal: Risk for activity intolerance will decrease Outcome: Progressing   Problem: Respiratory: Goal: Respiratory status will improve Outcome: Progressing   Problem: Skin Integrity: Goal: Risk for impaired skin integrity will decrease Outcome: Progressing

## 2018-04-16 NOTE — Care Management Note (Signed)
Case Management Note Marvetta Gibbons RN, BSN Transitions of Care Unit 4E- RN Case Manager [phone]  Patient Details  Name: [name] MRN: [id number] Date of Birth: 02-Oct-1955  Subjective/Objective:   Pt admitted s/p emergent CABGx2                 Action/Plan: PTA pt lived at home with spouse- plan to return home- no CM needs noted for transition home   Expected Discharge Date:  04/16/18               Expected Discharge Plan:  Home/Self Care  In-House Referral:  NA  Discharge planning Services  CM Consult  Post Acute Care Choice:  NA Choice offered to:  NA  DME Arranged:    DME Agency:     HH Arranged:    [place] Agency:     Status of Service:  Completed, signed off  If discussed at [place] of Stay Meetings, dates discussed:    Discharge Disposition: home/self care   Additional Comments:  Dawayne Patricia, RN 04/16/2018, 12:42 PM

## 2018-04-16 NOTE — Progress Notes (Signed)
[facility] will sign off.   Medication Recommendations:  Continue current medication except consider changing Omeprazole to Protonix given Plavix use.  Other recommendations (labs, testing, etc):  LFTs and Lipid panel in 6 weeks Follow up as an outpatient: 04/30/18 with APP

## 2018-04-16 NOTE — Discharge Instructions (Signed)
Discharge Instructions:  1. You may shower, please wash incisions daily with soap and water and keep dry.  If you wish to cover wounds with dressing you may do so but please keep clean and change daily.  No tub baths or swimming until incisions have completely healed.  If your incisions become red or develop any drainage please call our office at [phone]  2. No Driving until cleared by Dr. Guy Sandifer office and you are no longer using narcotic pain medications  3. Monitor your weight daily.. Please use the same scale and weigh at same time... If you gain 5-10 lbs in 48 hours with associated lower extremity swelling, please contact our office at [phone]  4. Fever of 101.5 for at least 24 hours with no source, please contact our office at [phone]  5. Activity- up as tolerated, please walk at least 3 times per day.  Avoid strenuous activity, no lifting, pushing, or pulling with your arms over 8-10 lbs for a minimum of 6 weeks  6. If any questions or concerns arise, please do not hesitate to contact our office at [phone]   - - - - - - - - - - - - - - - - - - - - - - - - - - - - - - - - - - - - - - - - - - - - - - - - - - - - - - - -  Information about your medication: Plavix (anti-platelet agent)  Generic Name (Brand): clopidogrel (Plavix), once daily medication  PURPOSE: You are taking this medication along with aspirin to lower your chance of having a heart attack, stroke, or blood clots in your heart stent. These can be fatal. Brilinta and aspirin help prevent platelets from sticking together and forming a clot that can block an artery or your stent.   Common SIDE EFFECTS you may experience include: bruising or bleeding more easily, shortness of breath  Do not stop taking PLAVIX without talking to the doctor who prescribes it for you. People who are treated with a stent and stop taking Plavix too soon, have a higher risk of getting a blood clot in the stent, having a heart  attack, or dying. If you stop Plavix because of bleeding, or for other reasons, your risk of a heart attack or stroke may increase.   Tell all of your doctors and dentists that you are taking Plavix. They should talk to the doctor who prescribed plavix for you before you have any surgery or invasive procedure.   Contact your health care provider if you experience: severe or uncontrollable bleeding, pink/red/brown urine, vomiting blood or vomit that looks like "coffee grounds", red or black stools (looks like tar), coughing up blood or blood clots ----------------------------------------------------------------------------------------------------------------------

## 2018-04-16 NOTE — Progress Notes (Signed)
CARDIAC REHAB PHASE I   Reinforced d/c ed at request of RN. Pt and wife did not remember ed from earlier today. Educated on importance of sternal precautions. Educated on incision care. Wife states she is "tired and not listening, and pt is nervous". D/c materials at bedside.   [id number] Rufina Falco, RN BSN 04/16/2018 11:44 AM

## 2018-04-16 NOTE — Progress Notes (Signed)
[id number] Pt stated he does not need walker for home as he has access to one. Completed ed with pt and wife who voiced understanding. Discussed IS, sternal precautions and staying in the tube, counting carbs and heart healthy food choices, ex ed  and CRP 2. Referred to [place] CRP 2. Graylon Good RN BSN 04/16/2018 9:36 AM

## 2018-04-16 NOTE — Progress Notes (Signed)
This note also relates to the following rows which could not be included: Pulse Rate - Cannot attach notes to unvalidated device data ECG Heart Rate - Cannot attach notes to unvalidated device data Resp - Cannot attach notes to unvalidated device data BP - Cannot attach notes to unvalidated device data SpO2 - Cannot attach notes to unvalidated device data

## 2018-04-16 NOTE — Progress Notes (Addendum)
[address]       [place] [address]             [phone]      4 Days Post-Op Procedure(s) (LRB): CORONARY ARTERY BYPASS GRAFTING (CABG) x two , using left internal mammary artery and right leg greater saphenous vein harvested endoscopically (N/A) TRANSESOPHAGEAL ECHOCARDIOGRAM (TEE) (N/A) Subjective: Some nausea yesterday, currently none  Objective: Vital signs in last 24 hours: Temp:  [98.3 F (36.8 C)-99.3 F (37.4 C)] 98.4 F (36.9 C) (12/19 0747) Pulse Rate:  [77-90] 82 (12/19 0747) Cardiac Rhythm: Normal sinus rhythm (12/18 1900) Resp:  [17-24] 17 (12/19 0747) BP: (106-137)/(58-79) 115/71 (12/19 0747) SpO2:  [94 %-100 %] 96 % (12/19 0747) Weight:  [111.3 kg] 111.3 kg (12/19 0515)  Hemodynamic parameters for last 24 hours:    Intake/Output from previous day: 12/18 0701 - 12/19 0700 In: 360 [P.O.:360] Out: 950 [Urine:950] Intake/Output this shift: No intake/output data recorded.  General appearance: alert, cooperative and no distress Heart: regular rate and rhythm Lungs: clear to auscultation bilaterally Abdomen: benign Extremities: no edema Wound: incis healing well  Lab Results: Recent Labs    04/14/18 0412 04/15/18 0235  WBC 12.9* 13.9*  HGB 11.1* 11.6*  HCT 35.7* 35.0*  PLT 208 222   BMET:  Recent Labs    04/14/18 0412 04/15/18 0235  NA 136 137  K 3.8 3.5  CL 103 100  CO2 24 27  GLUCOSE 128* 131*  BUN 12 9  CREATININE 1.01 1.12  CALCIUM 8.2* 8.3*    PT/INR: No results for input(s): LABPROT, INR in the last 72 hours. ABG    Component Value Date/Time   PHART 7.339 (L) 04/13/2018 0522   HCO3 19.7 (L) 04/13/2018 0522   TCO2 25 04/13/2018 1620   ACIDBASEDEF 5.0 (H) 04/13/2018 0522   O2SAT 89.0 04/13/2018 0522   CBG (last 3)  Recent Labs    04/15/18 1627 04/15/18 2042 04/16/18 0615  GLUCAP 134* 131* 118*    Meds Scheduled Meds: . acetaminophen  1,000 mg Oral Q6H  . aspirin EC  81 mg Oral Daily  .  bisacodyl  10 mg Oral Daily   Or  . bisacodyl  10 mg Rectal Daily  . chlorhexidine  15 mL Mouth Rinse BID  . Chlorhexidine Gluconate Cloth  6 each Topical Daily  . clopidogrel  75 mg Oral Daily  . docusate sodium  200 mg Oral Daily  . enoxaparin (LOVENOX) injection  30 mg Subcutaneous QHS  . furosemide  40 mg Oral BID  . insulin aspart  0-24 Units Subcutaneous TID AC & HS  . mouth rinse  15 mL Mouth Rinse q12n4p  . metoprolol tartrate  12.5 mg Oral BID  . pantoprazole  40 mg Oral Daily  . rosuvastatin  10 mg Oral q1800  . sodium chloride flush  10 mL Intravenous Q12H  . sodium chloride flush  10-40 mL Intracatheter Q12H  . sodium chloride flush  3 mL Intravenous Q12H  . tobramycin  2 drop Right Eye Q6H   Continuous Infusions: . sodium chloride    . lactated ringers     PRN Meds:.metoprolol tartrate, morphine injection, ondansetron (ZOFRAN) IV, oxyCODONE, sodium chloride flush, sodium chloride flush, sodium chloride flush, traMADol, traZODone  Xrays Dg Chest 2 View  Result Date: 04/15/2018 CLINICAL DATA:  Atelectasis EXAM: CHEST - 2 VIEW COMPARISON:  04/14/2018 FINDINGS: CABG. Cardiomegaly. Bibasilar atelectasis and small effusions. Interval removal of left chest  tube. No pneumothorax. IMPRESSION: Cardiomegaly.  Bibasilar atelectasis with small effusions. Electronically Signed   By: Rolm Baptise M.D.   On: 04/15/2018 08:03    Assessment/Plan: S/P Procedure(s) (LRB): CORONARY ARTERY BYPASS GRAFTING (CABG) x two , using left internal mammary artery and right leg greater saphenous vein harvested endoscopically (N/A) TRANSESOPHAGEAL ECHOCARDIOGRAM (TEE) (N/A)  POD#4 Plan for discharge: see discharge orders Doing well BS controlled No new labs Hemodyn stable in sinus rhythm  No fevers  LOS: 4 days    [facility] 04/16/2018 Pager [phone]  I have seen and examined the patient and agree with the assessment and plan as outlined.  D/C home.  Rexene Alberts,  MD 04/16/2018 8:44 AM

## 2018-04-17 ENCOUNTER — Telehealth: Payer: Self-pay

## 2018-04-17 NOTE — Telephone Encounter (Signed)
Patient's family, [name] contacted the office questioning how often she should be placing the iodine around [name] sternal incision. He is s/p CABG x2 04/12/18 with Dr. Roxy Manns.  I advised her to apply the iodine once daily for 6 days on incision site.  She acknowledged receipt.

## 2018-04-20 DIAGNOSIS — Z736 Limitation of activities due to disability: Secondary | ICD-10-CM

## 2018-04-23 ENCOUNTER — Telehealth (HOSPITAL_COMMUNITY): Payer: Self-pay

## 2018-04-23 NOTE — Telephone Encounter (Signed)
Pt insurance is active and benefits verified through [facility]. Co-pay $0.00, DED $1,400.00/$1,400.00 met, out of pocket $3,200.00/$1,595.35 met, co-insurance 0%. No pre-authorization required. Passport, 04/23/18 @ 8:42AM, REF# [id number]  Will contact patient to see if he is interested in the Cardiac Rehab Program. If interested, patient will need to complete follow up appt. Once completed, patient will be contacted for scheduling upon review by the RN Navigator.

## 2018-04-23 NOTE — Telephone Encounter (Signed)
Called patient to see if he is interested in the Cardiac Rehab Program. Patient expressed interest. Explained scheduling process and went over insurance, patient verbalized understanding. Will contact patient for scheduling once f/u has been completed.  °

## 2018-04-28 NOTE — Progress Notes (Signed)
Cardiology Office Note   Date:  04/30/2018   ID:  Tyvon Eggenberger, DOB 09/15/1955, MRN [id number]  PCP:  Venia Carbon, MD  Cardiologist: Dr.Acharya  No chief complaint on file.    History of Present Illness: Sanford Lindblad is a 62 y.o. male who presents for post hospitalization follow up after admission for NSTEMI which evolved into an anterior STEMI while on IV heparin.   Emergent cardiac cath was performed demonstrating critical stenosis of the ostium of the LAD and subtotal stenosis of the mid vessel. This lead to emergency CABG X 2 with LIMA to LAD and SVG to Diagonal on 04/12/2018 by Dr. Ricard Dillon.  He had an uneventful post operative course started on DAPT, and Crestor. He was given post operative lasix. He was discharged on 04/15/2018. He is here for close follow up.  He has other history of DM, hyperlipidemia, HTN, chronic RBBB and GERD.   He is doing well but is a little afraid to increase his activity due to anxiety about the post-operative course healing.   He has been walking 11 minutes a day. He does have some soreness and has had bouts of fatigue and depression.  These have been transient.  He is medically compliant    Past Medical History:  Diagnosis Date  . CAD (coronary artery disease) 7/07   Cath for unstable angina--- 50-70% LAD, 40% circ  . ED (erectile dysfunction)   . GERD (gastroesophageal reflux disease)   . Hx of colonic polyps   . Hyperlipidemia   . Hypertension   . Impaired fasting glucose   . 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 Sleep disorder   . STEMI involving left anterior descending coronary artery ([place]) 04/12/2018  . Wrist fracture     6 foot fall onto head, concussion, L wrist (comminuted with intra-articular extension) and 2nd finger (middle phalanx) fxs, vertigo    Past Surgical History:  Procedure Laterality Date  . BRANCHIAL CLEFT CYST EXCISION    . COLONOSCOPY  04/03/2007  . CORONARY ARTERY BYPASS  GRAFT N/A 04/12/2018   Procedure: CORONARY ARTERY BYPASS GRAFTING (CABG) x two , using left internal mammary artery and right leg greater saphenous vein harvested endoscopically;  Surgeon: Rexene Alberts, MD;  Location: [facility];  Service: Open Heart Surgery;  Laterality: N/A;  . CORONARY/GRAFT ACUTE MI REVASCULARIZATION N/A 04/12/2018   Procedure: Coronary/Graft Acute MI Revascularization;  Surgeon: Sherren Mocha, MD;  Location: [place] [facility];  Service: Cardiovascular;  Laterality: N/A;  . HEMORRHOID SURGERY    . LEFT HEART CATH AND CORONARY ANGIOGRAPHY N/A 04/12/2018   Procedure: LEFT HEART CATH AND CORONARY ANGIOGRAPHY;  Surgeon: Sherren Mocha, MD;  Location: [place] [facility];  Service: Cardiovascular;  Laterality: N/A;  . RHINOPLASTY    . TEE WITHOUT CARDIOVERSION N/A 04/12/2018   Procedure: TRANSESOPHAGEAL ECHOCARDIOGRAM (TEE);  Surgeon: Rexene Alberts, MD;  Location: [place];  Service: Open Heart Surgery;  Laterality: N/A;     Current Outpatient Medications  Medication Sig Dispense Refill  . aspirin EC 81 MG tablet Take 81 mg by mouth daily.    . clopidogrel (PLAVIX) 75 MG tablet Take 1 tablet (75 mg total) by mouth daily. 30 tablet 1  . metFORMIN (GLUCOPHAGE-XR) 500 MG 24 hr tablet Take 1 tablet (500 mg total) by mouth daily with breakfast. 90 tablet 3  . metoprolol tartrate (LOPRESSOR) 25 MG tablet Take 0.5 tablets (12.5  mg total) by mouth 2 (two) times daily. 30 tablet 1  . Multiple Vitamin (MULTIVITAMIN) tablet Take 1 tablet by mouth daily.    . Omega-3 Fatty Acids (OMEGA-3 FISH OIL PO) Take 1,000 mg by mouth daily.    . pantoprazole (PROTONIX) 40 MG tablet Take 1 tablet (40 mg total) by mouth daily. 30 tablet 1  . rosuvastatin (CRESTOR) 10 MG tablet Take 1 tablet (10 mg total) by mouth daily at 6 PM. 30 tablet 1   No current facility-administered medications for this visit.     Allergies:   Atorvastatin; Simvastatin; and Pravachol [pravastatin sodium]    Social  History:  The patient  reports that he quit smoking about 12 years ago. His smoking use included cigarettes. He quit after 35.00 years of use. He has never used smokeless tobacco. He reports current alcohol use. He reports that he does not use drugs.   Family History:  The patient's family history includes Cancer in his brother; Colon cancer (age of onset: 25) in his brother; Coronary artery disease in his brother and mother; Stroke in his mother.    ROS: All other systems are reviewed and negative. Unless otherwise mentioned in H&P    PHYSICAL EXAM: VS:  BP 124/70   Pulse 77   Ht 5\' 10"  (1.778 m)   Wt 234 lb (106.1 kg)   BMI 33.58 kg/m  , BMI Body mass index is 33.58 kg/m. GEN: Well nourished, well developed, in no acute distress HEENT: normal Neck: no JVD, carotid bruits, or masses Cardiac: RRR; distant heart sounds,  no murmurs, rubs, or gallops,no edema  Respiratory:  Clear to auscultation bilaterally, normal work of breathing GI: soft, nontender, nondistended, + BS MS: no deformity or atrophy. Well healed sternotomy incision with no evidence of bleeding or drainage. Right SVG harvest site with mild irritation but no pain.  Skin: warm and dry, no rash Neuro:  Strength and sensation are intact Psych: euthymic mood, full affect   EKG:  Sinus rhythm with 1st degree block, anterior T-wave inversion. Rate of 77 bpm.   Recent Labs: 04/12/2018: ALT 32 04/13/2018: Magnesium 2.4 04/15/2018: BUN 9; Creatinine, Ser 1.12; Hemoglobin 11.6; Platelets 222; Potassium 3.5; Sodium 137    Lipid Panel    Component Value Date/Time   CHOL 137 04/13/2018 0415   TRIG 83 04/13/2018 0415   HDL 31 (L) 04/13/2018 0415   CHOLHDL 4.4 04/13/2018 0415   VLDL 17 04/13/2018 0415   LDLCALC 89 04/13/2018 0415   LDLDIRECT 171.0 12/25/2012 0950      Wt Readings from Last 3 Encounters:  04/30/18 234 lb (106.1 kg)  04/16/18 245 lb 6 oz (111.3 kg)  03/30/18 247 lb (112 kg)    Other studies  Reviewed: Cardiac Cath 04/12/2018  Dist LM to Ost LAD lesion is 90% stenosed.  Prox LAD to Mid LAD lesion is 95% stenosed.  There is mild left ventricular systolic dysfunction.  LV end diastolic pressure is normal.  The left ventricular ejection fraction is 50-55% by visual estimate.   1.  Severe single-vessel coronary artery disease involving the ostium and the midportion of the LAD which correlates with the patient's acute injury current on EKG 2.  Patent left mainstem, left circumflex, and RCA with mild nonobstructive stenosis 3.  Mild contraction abnormality of the LV apex with preserved overall LVEF estimated at 50 to 55%  Recommendations: With ostial LAD involvement, recommend emergency coronary bypass grafting.  Cardiac surgical consultation is placed.  CABG 04/15/2018  Coronary Artery Bypass Grafting x2 Left Internal Mammary Artery to Distal Left Anterior Descending Coronary Artery Sapheonous Vein Graft to FirstDiagonal Branch Coronary Artery Endoscopic Vein Harvest from RightThigh  ASSESSMENT AND PLAN:  1. CAD: S/P ACS with anterior STEMI requiring emergent cardiac cath revealing critical 90% ostial stenosis of the LAD and 95% stenosis of the mid LAD due to ruptured plaque. He was recommended for CABG. LIMA to LAD and SVG to 1st diagonal done emergently.   He is doing well with some anxiety about what is to come next. He is walking 11 minutes a day and his wife is very stringent on his diet. Multiple questions asked and answered. He is encouraged to continue slow increase and activity. He is okay to move forward with cardiac rehab when released by CVTS.   2. Hypercholesterolemia: He is to continue on statin rosuvastatin. LDL goal of < 70.    3. Hypertension:  He is currently well controlled.  No changes in his regimen.   Current medicines are reviewed at length with the patient today.    Labs/ tests ordered today include:   None  Phill Myron. [place] Pugh, ANP, AACC   04/30/2018 11:27 AM    [facility] [place] [address] Office [phone] Fax [phone]

## 2018-04-30 ENCOUNTER — Ambulatory Visit: Payer: BLUE CROSS/BLUE SHIELD | Admitting: Adult Health

## 2018-04-30 ENCOUNTER — Encounter: Payer: Self-pay | Admitting: Adult Health

## 2018-04-30 VITALS — BP 124/70 | HR 77 | Ht 70.0 in | Wt 234.0 lb

## 2018-04-30 DIAGNOSIS — E7800 Pure hypercholesterolemia, unspecified: Secondary | ICD-10-CM | POA: Diagnosis not present

## 2018-04-30 DIAGNOSIS — Z951 Presence of aortocoronary bypass graft: Secondary | ICD-10-CM | POA: Diagnosis not present

## 2018-04-30 DIAGNOSIS — I10 Essential (primary) hypertension: Secondary | ICD-10-CM | POA: Diagnosis not present

## 2018-04-30 DIAGNOSIS — I2510 Atherosclerotic heart disease of native coronary artery without angina pectoris: Secondary | ICD-10-CM

## 2018-04-30 NOTE — Patient Instructions (Signed)
Follow-Up: You will need a follow up appointment in 3 months.  You may see Elouise Munroe, MD Jory Sims, DNP, AACC  or one of the following Advanced Practice Providers on your designated Care Team:  Rosaria Ferries, [place], DNP, ANP  Medication Instructions:  NO CHANGES- Your physician recommends that you continue on your current medications as directed. Please refer to the Current Medication list given to you today. If you need a refill on your cardiac medications before your next appointment, please call your pharmacy.  Labwork: When you have labs (blood work) and your tests are completely normal, you will receive your results ONLY by [place] (if you have MyChart) -OR- A paper copy in the mail.  YOU MUST BE CLEARED BY  SURGEON TO START CARDIAC REHAB  At [facility], you and your health needs are our priority.  As part of our continuing mission to provide you with exceptional heart care, we have created designated Provider Care Teams.  These Care Teams include your primary Cardiologist (physician) and Advanced Practice Providers (APPs -  Physician Assistants and Nurse Practitioners) who all work together to provide you with the care you need, when you need it.  Thank you for choosing [facility] at [facility]!!

## 2018-05-05 ENCOUNTER — Telehealth (HOSPITAL_COMMUNITY): Payer: Self-pay

## 2018-05-05 NOTE — Telephone Encounter (Signed)
Attempted to call patient in regards to Cardiac Rehab - LM on VM 

## 2018-05-05 NOTE — Telephone Encounter (Signed)
Pt wife [name] returned CR phone call, pt will come in for orientation 06/11/2018 @ 830AM and attend our 245PM class.  Mailed homework package.

## 2018-05-08 ENCOUNTER — Other Ambulatory Visit (HOSPITAL_COMMUNITY): Payer: Self-pay | Admitting: Physician Assistant

## 2018-05-08 NOTE — Telephone Encounter (Signed)
This is Dr. Acharya's pt ?

## 2018-05-08 NOTE — Telephone Encounter (Signed)
Rx request sent to pharmacy.  

## 2018-05-14 ENCOUNTER — Other Ambulatory Visit: Payer: Self-pay | Admitting: Physician Assistant

## 2018-05-14 MED ORDER — METOPROLOL TARTRATE 25 MG PO TABS: 12.5000 mg | ORAL_TABLET | Freq: Two times a day (BID) | ORAL | 3 refills | Status: DC

## 2018-05-14 MED ORDER — ROSUVASTATIN CALCIUM 10 MG PO TABS: 10.0000 mg | ORAL_TABLET | Freq: Every day | ORAL | 3 refills | Status: DC

## 2018-05-14 NOTE — Telephone Encounter (Signed)
Pt's medication was sent to pt's pharmacy as requested. Confirmation received.  °

## 2018-05-15 ENCOUNTER — Telehealth (HOSPITAL_COMMUNITY): Payer: Self-pay

## 2018-05-15 ENCOUNTER — Other Ambulatory Visit: Payer: Self-pay | Admitting: Thoracic Surgery (Cardiothoracic Vascular Surgery)

## 2018-05-15 DIAGNOSIS — Z951 Presence of aortocoronary bypass graft: Secondary | ICD-10-CM

## 2018-05-18 ENCOUNTER — Ambulatory Visit (INDEPENDENT_AMBULATORY_CARE_PROVIDER_SITE_OTHER): Payer: Self-pay | Admitting: Physician Assistant

## 2018-05-18 ENCOUNTER — Ambulatory Visit
Admission: RE | Admit: 2018-05-18 | Discharge: 2018-05-18 | Disposition: A | Discharge status: Home / Self Care | Payer: BLUE CROSS/BLUE SHIELD | Source: Ambulatory Visit | Attending: Thoracic Surgery (Cardiothoracic Vascular Surgery) | Admitting: Thoracic Surgery (Cardiothoracic Vascular Surgery)

## 2018-05-18 ENCOUNTER — Encounter: Payer: BLUE CROSS/BLUE SHIELD | Admitting: Thoracic Surgery (Cardiothoracic Vascular Surgery)

## 2018-05-18 ENCOUNTER — Other Ambulatory Visit: Payer: Self-pay

## 2018-05-18 VITALS — BP 135/83 | HR 76 | Resp 16 | Ht 70.0 in | Wt 234.0 lb

## 2018-05-18 DIAGNOSIS — J90 Pleural effusion, not elsewhere classified: Secondary | ICD-10-CM | POA: Diagnosis not present

## 2018-05-18 DIAGNOSIS — I2102 ST elevation (STEMI) myocardial infarction involving left anterior descending coronary artery: Secondary | ICD-10-CM

## 2018-05-18 DIAGNOSIS — Z951 Presence of aortocoronary bypass graft: Secondary | ICD-10-CM

## 2018-05-18 NOTE — Progress Notes (Signed)
[address]       [place] [address]             [phone]      [name] is a 63 y.o. male patient status post emergent coronary bypass grafting x2 on December 15th, 2019.  He had an uneventful hospital stay and was started on Plavix and aspirin due to STEMI.  He was discharged on 04/16/2018.  He was seen by cardiology on April 30, 2018.  Overall he was doing quite well at that appointment but had some anxiety about the next steps in his recovery.  There were no medication changes at that time.  He presents today for his routine 4-week follow-up visit.   No diagnosis found. Past Medical History:  Diagnosis Date  . CAD (coronary artery disease) 7/07   Cath for unstable angina--- 50-70% LAD, 40% circ  . ED (erectile dysfunction)   . GERD (gastroesophageal reflux disease)   . Hx of colonic polyps   . Hyperlipidemia   . Hypertension   . Impaired fasting glucose   . 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 Sleep disorder   . STEMI involving left anterior descending coronary artery ([place]) 04/12/2018  . Wrist fracture     6 foot fall onto head, concussion, L wrist (comminuted with intra-articular extension) and 2nd finger (middle phalanx) fxs, vertigo   No past surgical history pertinent negatives on file. Scheduled Meds: Current Outpatient Medications on File Prior to Visit  Medication Sig Dispense Refill  . aspirin EC 81 MG tablet Take 81 mg by mouth daily.    . clopidogrel (PLAVIX) 75 MG tablet Take 1 tablet (75 mg total) by mouth daily. 30 tablet 1  . metFORMIN (GLUCOPHAGE-XR) 500 MG 24 hr tablet Take 1 tablet (500 mg total) by mouth daily with breakfast. 90 tablet 3  . metoprolol tartrate (LOPRESSOR) 25 MG tablet Take 0.5 tablets (12.5 mg total) by mouth 2 (two) times daily. 90 tablet 3  . Multiple Vitamin (MULTIVITAMIN) tablet Take 1 tablet by mouth daily.    . Omega-3 Fatty Acids (OMEGA-3 FISH OIL PO) Take  1,000 mg by mouth daily.    . pantoprazole (PROTONIX) 40 MG tablet TAKE 1 TABLET BY MOUTH EVERY DAY 30 tablet 1  . rosuvastatin (CRESTOR) 10 MG tablet Take 1 tablet (10 mg total) by mouth daily at 6 PM. 90 tablet 3   No current facility-administered medications on file prior to visit.     Allergies  Allergen Reactions  . Atorvastatin     REACTION: muscle aches  . Simvastatin     REACTION: hand pain  . Pravachol [Pravastatin Sodium]     Muscle aching    Blood pressure 135/83, pulse 76, resp. rate 16, height 5\' 10"  (1.778 m), weight 106.1 kg, SpO2 94 %.  Subjective [name] presents today status post coronary bypass grafting x3 done emergently on 04/12/2018.  He returns today for his routine 4-week postoperative appointment.  He is having trouble sleeping and states that he has" good days and bad days".  His major complaint today is hand pain which is due to his statin.  He has had an intolerance to atorvastatin, simvastatin, and pravastatin.  He was recently started on Crestor and is having the same symptoms.  This will need to be addressed at his cardiology office.  I have discontinued the medication for now.  He also shares that he has left hip and upper leg numbness.  I doubt this is due to the surgery since we did not make an incision on the side.  However, he also states he occasionally has pins-and-needles in his left toes therefore, this may need to be evaluated by a vascular surgeon.  He does have good blood flow to bilateral lower extremities and good capillary refill in his left toes.  It could also be musculoskeletal since his he is less active now than before surgery.  I suggested bringing up the numbness to his primary care provider if it does not improve.  Objective   Cor: Regular rate and rhythm, no murmur Pulm: Clear to auscultation bilaterally in all fields Abd: Soft, nontender Wound: Sternal incision well-healed without drainage.  Right EVH site is well-healed without  hematoma. Ext: Warm and well-perfused extremities.  No edema.     CLINICAL DATA:  Patient status post CABG procedure.  EXAM: CHEST - 2 VIEW  COMPARISON:  Chest radiograph 04/15/2018  FINDINGS: Stable cardiac and mediastinal contours. No large area pulmonary consolidation. Small left pleural effusion. Thoracic spine degenerative changes.  IMPRESSION: Small left pleural effusion with underlying atelectasis.   Electronically Signed   By: Lovey Newcomer M.D.   On: 05/18/2018 12:39  Assessment & Plan   The patient's 2 main complaints today are pain in his hands which has led to an inability to even hold his pills to take in the morning.  This is due to the statin therapy that he has been prescribed.  He had the same effects occur from other medications in the statin family.  I suggested bringing this issue up to his cardiology team so that they may try a different medication perhaps Zetia if insurance allows.  He is currently on metformin however, the patient has lost 20 to 25 pounds since his heart attack and I suggested revisiting his blood glucose level with his primary care provider since he is only on metformin 500 mg daily he may not even require this medication anymore.  He is much more cautious about his diet and has been limiting his sugar intake.  Some days, he is not hungry at all and has to force himself to eat.  Overall, it seems as if he is making more healthy choices.  His chest x-ray was reviewed at the bedside and all questions were answered.  He is very interested in starting a cardiac rehab program as soon as possible.  I have put a referral in place today and the patient shares that he has an evaluation coming up.  I cleared him to drive a motor vehicle since he is no longer requiring pain medication during the day.  Even though he is having numbness in his upper left leg this should not affect his driving.  Overall, he feels quite well and feels like he is making forward  progress.  He will continue Plavix and aspirin until stopped by his cardiology team.  I scheduled a follow-up appointment for him in 2 months to see Dr. Roxy Manns.  At this time the patient plans on having to talk about going back to work.  The patient has a very active job that requires heavy lifting therefore, I suggested waiting at least 12 weeks before returning to work.  I have reached out to Gay Filler to see if she can arrange a cardiology appointment for [name] in the next few weeks to address some of these lingering issues.  The patient is encouraged to call our office if any new problems arise and if he would like to see someone sooner.  All questions were answered to the patient's and family member satisfaction.  We will see Mr. Jankovich in 2 months.   Elgie Collard 05/18/2018

## 2018-05-19 ENCOUNTER — Encounter: Payer: Self-pay | Admitting: Internal Medicine

## 2018-05-19 ENCOUNTER — Ambulatory Visit: Payer: BLUE CROSS/BLUE SHIELD | Admitting: Internal Medicine

## 2018-05-19 ENCOUNTER — Other Ambulatory Visit: Payer: Self-pay

## 2018-05-19 VITALS — BP 130/76 | HR 85 | Temp 97.9°F | Ht 70.0 in | Wt 225.0 lb

## 2018-05-19 DIAGNOSIS — R110 Nausea: Secondary | ICD-10-CM | POA: Diagnosis not present

## 2018-05-19 NOTE — Progress Notes (Signed)
Subjective:    Patient ID: [name], male    DOB: 1956/04/29, 63 y.o.   MRN: [id number]  HPI Here with wife Had emergency CABG about 5 weeks ago Sent home and doing fair  Has not been eating as well Some nausea after eating---occasionally before Not much gas or bloating Bowels moving regular  Cleared to drive but not work yet Taken off statin due to muscle aching in hands (4th statin failure)  No chest pain No SOB  Current Outpatient Medications on File Prior to Visit  Medication Sig Dispense Refill  . aspirin EC 81 MG tablet Take 81 mg by mouth daily.    . clopidogrel (PLAVIX) 75 MG tablet Take 1 tablet (75 mg total) by mouth daily. 30 tablet 1  . metFORMIN (GLUCOPHAGE-XR) 500 MG 24 hr tablet Take 1 tablet (500 mg total) by mouth daily with breakfast. 90 tablet 3  . metoprolol tartrate (LOPRESSOR) 25 MG tablet Take 0.5 tablets (12.5 mg total) by mouth 2 (two) times daily. 90 tablet 3  . Multiple Vitamin (MULTIVITAMIN) tablet Take 1 tablet by mouth daily.    . Omega-3 Fatty Acids (OMEGA-3 FISH OIL PO) Take 1,000 mg by mouth daily.    . pantoprazole (PROTONIX) 40 MG tablet TAKE 1 TABLET BY MOUTH EVERY DAY 30 tablet 1   No current facility-administered medications on file prior to visit.     Allergies  Allergen Reactions  . Atorvastatin     REACTION: muscle aches  . Simvastatin     REACTION: hand pain  . Pravachol [Pravastatin Sodium]     Muscle aching    Past Medical History:  Diagnosis Date  . CAD (coronary artery disease) 7/07   Cath for unstable angina--- 50-70% LAD, 40% circ  . ED (erectile dysfunction)   . GERD (gastroesophageal reflux disease)   . Hx of colonic polyps   . Hyperlipidemia   . Hypertension   . Impaired fasting glucose   . 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 Sleep disorder   . STEMI involving left anterior descending coronary artery ([place]) 04/12/2018  . Wrist fracture     6 foot fall onto  head, concussion, L wrist (comminuted with intra-articular extension) and 2nd finger (middle phalanx) fxs, vertigo    Past Surgical History:  Procedure Laterality Date  . BRANCHIAL CLEFT CYST EXCISION    . COLONOSCOPY  04/03/2007  . CORONARY ARTERY BYPASS GRAFT N/A 04/12/2018   Procedure: CORONARY ARTERY BYPASS GRAFTING (CABG) x two , using left internal mammary artery and right leg greater saphenous vein harvested endoscopically;  Surgeon: Rexene Alberts, MD;  Location: [facility];  Service: Open Heart Surgery;  Laterality: N/A;  . CORONARY/GRAFT ACUTE MI REVASCULARIZATION N/A 04/12/2018   Procedure: Coronary/Graft Acute MI Revascularization;  Surgeon: Sherren Mocha, MD;  Location: [facility];  Service: Cardiovascular;  Laterality: N/A;  . HEMORRHOID SURGERY    . LEFT HEART CATH AND CORONARY ANGIOGRAPHY N/A 04/12/2018   Procedure: LEFT HEART CATH AND CORONARY ANGIOGRAPHY;  Surgeon: Sherren Mocha, MD;  Location: [facility];  Service: Cardiovascular;  Laterality: N/A;  . RHINOPLASTY    . TEE WITHOUT CARDIOVERSION N/A 04/12/2018   Procedure: TRANSESOPHAGEAL ECHOCARDIOGRAM (TEE);  Surgeon: Rexene Alberts, MD;  Location: [facility];  Service: Open Heart Surgery;  Laterality: N/A;    Family History  Problem Relation Age of Onset  . Coronary artery disease Mother   .  Stroke Mother   . Coronary artery disease Brother   . Cancer Brother        colon or rectal  . Colon cancer Brother 52       half-brother   . Diabetes Neg Hx   . Stomach cancer Neg Hx     Social History   Socioeconomic History  . Marital status: Married    Spouse name: Not on file  . Number of children: 4  . Years of education: Not on file  . Highest education level: Not on file  Occupational History  . Occupation: installs signs    Employer: [employer]  . Financial resource strain: Not on file  . Food insecurity:    Worry: Not on file    Inability: Not on file  . Transportation  needs:    Medical: Not on file    Non-medical: Not on file  Tobacco Use  . Smoking status: Former Smoker    Years: 35.00    Types: Cigarettes    Last attempt to quit: 10/27/2005    Years since quitting: 12.5  . Smokeless tobacco: Never Used  Substance and Sexual Activity  . Alcohol use: Yes    Comment: once a month-beer   . Drug use: No  . Sexual activity: Not on file  Lifestyle  . Physical activity:    Days per week: Not on file    Minutes per session: Not on file  . Stress: Not on file  Relationships  . Social connections:    Talks on phone: Not on file    Gets together: Not on file    Attends religious service: Not on file    Active member of club or organization: Not on file    Attends meetings of clubs or organizations: Not on file    Relationship status: Not on file  . Intimate partner violence:    Fear of current or ex partner: Not on file    Emotionally abused: Not on file    Physically abused: Not on file    Forced sexual activity: Not on file  Other Topics Concern  . Not on file  Social History Narrative  . Not on file   Review of Systems  Has lost 20# Sleeps fair---still taking 2 hour naps in day Sitting in recliner for sleep Still on PPI (though changed to protonix)     Objective:   Physical Exam  Constitutional: He appears well-developed. No distress.  Neck: No thyromegaly present.  Cardiovascular: Normal rate, regular rhythm and normal heart sounds. Exam reveals no gallop.  No murmur heard. Sternal wounds well healed  Respiratory: Effort normal and breath sounds normal. No respiratory distress. He has no wheezes. He has no rales.  GI: Soft. He exhibits no distension. There is no abdominal tenderness. There is no rebound and no guarding.  Musculoskeletal:        General: No edema.  Lymphadenopathy:    He has no cervical adenopathy.           Assessment & Plan:

## 2018-05-19 NOTE — Assessment & Plan Note (Signed)
Unclear cause On PPI and not really reflux symptoms Could be medication related----will have him hold the metformin, fish oil and vitamin for now No NSAIDs now ??plavix causing some trouble--would not stop this though

## 2018-05-19 NOTE — Patient Instructions (Signed)
For now, stop the fish oil, metformin and multivitamin. If you get back to feeling normal, you can try restarting them one at a time.

## 2018-05-27 ENCOUNTER — Encounter: Payer: Self-pay | Admitting: Internal Medicine

## 2018-05-27 ENCOUNTER — Ambulatory Visit: Payer: BLUE CROSS/BLUE SHIELD | Admitting: Internal Medicine

## 2018-05-27 VITALS — BP 120/68 | HR 65 | Ht 70.0 in | Wt 224.2 lb

## 2018-05-27 DIAGNOSIS — I2510 Atherosclerotic heart disease of native coronary artery without angina pectoris: Secondary | ICD-10-CM | POA: Diagnosis not present

## 2018-05-27 DIAGNOSIS — Z789 Other specified health status: Secondary | ICD-10-CM | POA: Diagnosis not present

## 2018-05-27 DIAGNOSIS — R7301 Impaired fasting glucose: Secondary | ICD-10-CM

## 2018-05-27 DIAGNOSIS — I10 Essential (primary) hypertension: Secondary | ICD-10-CM

## 2018-05-27 DIAGNOSIS — E7800 Pure hypercholesterolemia, unspecified: Secondary | ICD-10-CM

## 2018-05-27 DIAGNOSIS — Z951 Presence of aortocoronary bypass graft: Secondary | ICD-10-CM | POA: Diagnosis not present

## 2018-05-27 MED ORDER — DICLOFENAC SODIUM 1 % TD GEL: 2.0000 g | Freq: Four times a day (QID) | TRANSDERMAL | 1 refills | Status: DC

## 2018-05-27 MED ORDER — NITROGLYCERIN 0.4 MG SL SUBL: 0.4000 mg | SUBLINGUAL_TABLET | SUBLINGUAL | 3 refills | Status: DC | PRN

## 2018-05-27 NOTE — Patient Instructions (Addendum)
Medication Instructions:  Your physician recommends that you continue on your current medications as directed. Please refer to the Current Medication list given to you today.  A prescription for sublingual Nitro-glycerin has been sent to your pharmacy. Use as directed.  A prescription for Voltaren gel has been sent to your pharmacy. Use as directed. Further refill request will need to be sent to your primary care physician.  If you need a refill on your cardiac medications before your next appointment, please call your pharmacy.   Lab work: None ordered If you have labs (blood work) drawn today and your tests are completely normal, you will receive your results only by: Marland Kitchen MyChart Message (if you have MyChart) OR . A paper copy in the mail If you have any lab test that is abnormal or we need to change your treatment, we will call you to review the results.  Testing/Procedures: None ordered  Follow-Up: At [facility], you and your health needs are our priority.  As part of our continuing mission to provide you with exceptional heart care, we have created designated Provider Care Teams.  These Care Teams include your primary Cardiologist (physician) and Advanced Practice Providers (APPs -  Physician Assistants and Nurse Practitioners) who all work together to provide you with the care you need, when you need it.  Follow up as planned with Dr. [name] on 07/31/18 @ 9:40am  Any Other Special Instructions Will Be Listed Below (If Applicable). You have been referred to [facility] with the Pharmacist       Nitroglycerin sublingual tablets What is this medicine? NITROGLYCERIN (nye troe GLI ser in) is a type of vasodilator. It relaxes blood vessels, increasing the blood and oxygen supply to your heart. This medicine is used to relieve chest pain caused by angina. It is also used to prevent chest pain before activities like climbing stairs, going outdoors in cold weather, or sexual  activity. This medicine may be used for other purposes; ask your health care provider or pharmacist if you have questions. COMMON BRAND NAME(S): Nitroquick, Nitrostat, Nitrotab What should I tell my health care provider before I take this medicine? They need to know if you have any of these conditions: -anemia -head injury, recent stroke, or bleeding in the brain -liver disease -previous heart attack -an unusual or allergic reaction to nitroglycerin, other medicines, foods, dyes, or preservatives -pregnant or trying to get pregnant -breast-feeding How should I use this medicine? Take this medicine by mouth as needed. At the first sign of an angina attack (chest pain or tightness) place one tablet under your tongue. You can also take this medicine 5 to 10 minutes before an event likely to produce chest pain. Follow the directions on the prescription label. Let the tablet dissolve under the tongue. Do not swallow whole. Replace the dose if you accidentally swallow it. It will help if your mouth is not dry. Saliva around the tablet will help it to dissolve more quickly. Do not eat or drink, smoke or chew tobacco while a tablet is dissolving. If you are not better within 5 minutes after taking ONE dose of nitroglycerin, call 9-1-1 immediately to seek emergency medical care. Do not take more than 3 nitroglycerin tablets over 15 minutes. If you take this medicine often to relieve symptoms of angina, your doctor or health care professional may provide you with different instructions to manage your symptoms. If symptoms do not go away after following these instructions, it is important to call 9-1-1  immediately. Do not take more than 3 nitroglycerin tablets over 15 minutes. Talk to your pediatrician regarding the use of this medicine in children. Special care may be needed. Overdosage: If you think you have taken too much of this medicine contact a poison control center or emergency room at once. NOTE: This  medicine is only for you. Do not share this medicine with others. What if I miss a dose? This does not apply. This medicine is only used as needed. What may interact with this medicine? Do not take this medicine with any of the following medications: -certain migraine medicines like ergotamine and dihydroergotamine (DHE) -medicines used to treat erectile dysfunction like sildenafil, tadalafil, and vardenafil -riociguat This medicine may also interact with the following medications: -alteplase -aspirin -heparin -medicines for high blood pressure -medicines for mental depression -other medicines used to treat angina -phenothiazines like chlorpromazine, mesoridazine, prochlorperazine, thioridazine This list may not describe all possible interactions. Give your health care provider a list of all the medicines, herbs, non-prescription drugs, or dietary supplements you use. Also tell them if you smoke, drink alcohol, or use illegal drugs. Some items may interact with your medicine. What should I watch for while using this medicine? Tell your doctor or health care professional if you feel your medicine is no longer working. Keep this medicine with you at all times. Sit or lie down when you take your medicine to prevent falling if you feel dizzy or faint after using it. Try to remain calm. This will help you to feel better faster. If you feel dizzy, take several deep breaths and lie down with your feet propped up, or bend forward with your head resting between your knees. You may get drowsy or dizzy. Do not drive, use machinery, or do anything that needs mental alertness until you know how this drug affects you. Do not stand or sit up quickly, especially if you are an older patient. This reduces the risk of dizzy or fainting spells. Alcohol can make you more drowsy and dizzy. Avoid alcoholic drinks. Do not treat yourself for coughs, colds, or pain while you are taking this medicine without asking your  doctor or health care professional for advice. Some ingredients may increase your blood pressure. What side effects may I notice from receiving this medicine? Side effects that you should report to your doctor or health care professional as soon as possible: -blurred vision -dry mouth -skin rash -sweating -the feeling of extreme pressure in the head -unusually weak or tired Side effects that usually do not require medical attention (report to your doctor or health care professional if they continue or are bothersome): -flushing of the face or neck -headache -irregular heartbeat, palpitations -nausea, vomiting This list may not describe all possible side effects. Call your doctor for medical advice about side effects. You may report side effects to FDA at 1-800-FDA-1088. Where should I keep my medicine? Keep out of the reach of children. Store at room temperature between 20 and 25 degrees C (68 and 77 degrees F). Store in [employer]. Protect from light and moisture. Keep tightly closed. Throw away any unused medicine after the expiration date. NOTE: This sheet is a summary. It may not cover all possible information. If you have questions about this medicine, talk to your doctor, pharmacist, or health care provider.  2019 Elsevier/Gold Standard (2013-02-11 17:57:36)

## 2018-05-27 NOTE — Progress Notes (Signed)
Cardiology Office Note:    Date:  05/27/2018   ID:  [name], DOB [birth year], MRN [id number]  PCP:  Venia Carbon, MD  Cardiologist:  Elouise Munroe, MD  Electrophysiologist:  None   Referring MD: Venia Carbon, MD   Post surgical follow up.   History of Present Illness:    [name] is a 63 y.o. male with a hx of STEMI s/p emergency cabg due to severe ostial LAD disease with unfavorable anatomy for PCI. He has been seen in follow up in early January and has been doing well.  His primary concern is bilateral hand pain and stiffness. He has previously been prescribed diclofenac gel with relief of symptoms. He attributes this to intensification of statin therapy, which he has not tolerated in the past with similar symptoms. He is also concerned about left upper leg pain however this is not the site of his saphenous vein graft. No discoloration, distal cyanosis, or hematoma.  The patient denies chest pain, chest pressure, dyspnea at rest or with exertion, palpitations, PND, orthopnea, or leg swelling. Denies syncope or presyncope. Denies dizziness or lightheadedness.   Past Medical History:  Diagnosis Date  . CAD (coronary artery disease) 7/07   Cath for unstable angina--- 50-70% LAD, 40% circ  . ED (erectile dysfunction)   . GERD (gastroesophageal reflux disease)   . Hx of colonic polyps   . Hyperlipidemia   . Hypertension   . Impaired fasting glucose   . 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 Sleep disorder   . STEMI involving left anterior descending coronary artery ([place]) 04/12/2018  . Wrist fracture     6 foot fall onto head, concussion, L wrist (comminuted with intra-articular extension) and 2nd finger (middle phalanx) fxs, vertigo    Past Surgical History:  Procedure Laterality Date  . BRANCHIAL CLEFT CYST EXCISION    . CARDIAC CATHETERIZATION    . COLONOSCOPY  04/03/2007  . CORONARY ARTERY BYPASS GRAFT N/A  04/12/2018   Procedure: CORONARY ARTERY BYPASS GRAFTING (CABG) x two , using left internal mammary artery and right leg greater saphenous vein harvested endoscopically;  Surgeon: Rexene Alberts, MD;  Location: [facility];  Service: Open Heart Surgery;  Laterality: N/A;  . CORONARY/GRAFT ACUTE MI REVASCULARIZATION N/A 04/12/2018   Procedure: Coronary/Graft Acute MI Revascularization;  Surgeon: Sherren Mocha, MD;  Location: [facility];  Service: Cardiovascular;  Laterality: N/A;  . HEMORRHOID SURGERY    . LEFT HEART CATH AND CORONARY ANGIOGRAPHY N/A 04/12/2018   Procedure: LEFT HEART CATH AND CORONARY ANGIOGRAPHY;  Surgeon: Sherren Mocha, MD;  Location: [facility];  Service: Cardiovascular;  Laterality: N/A;  . RHINOPLASTY    . TEE WITHOUT CARDIOVERSION N/A 04/12/2018   Procedure: TRANSESOPHAGEAL ECHOCARDIOGRAM (TEE);  Surgeon: Rexene Alberts, MD;  Location: [facility];  Service: Open Heart Surgery;  Laterality: N/A;    Current Medications: Current Meds  Medication Sig  . aspirin EC 81 MG tablet Take 81 mg by mouth daily.  . metoprolol tartrate (LOPRESSOR) 25 MG tablet Take 0.5 tablets (12.5 mg total) by mouth 2 (two) times daily.  . [DISCONTINUED] clopidogrel (PLAVIX) 75 MG tablet Take 1 tablet (75 mg total) by mouth daily.  . [DISCONTINUED] metFORMIN (GLUCOPHAGE-XR) 500 MG 24 hr tablet Take 1 tablet (500 mg total) by mouth daily with breakfast.  . [DISCONTINUED] Multiple Vitamin (MULTIVITAMIN) tablet Take 1 tablet by mouth  daily.  . [DISCONTINUED] Omega-3 Fatty Acids (OMEGA-3 FISH OIL PO) Take 1,000 mg by mouth daily.  . [DISCONTINUED] pantoprazole (PROTONIX) 40 MG tablet TAKE 1 TABLET BY MOUTH EVERY DAY     Allergies:   Atorvastatin; Simvastatin; and Pravachol [pravastatin sodium]   Social History   Socioeconomic History  . Marital status: Married    Spouse name: Designer, fashion/clothing  . Number of children: 4  . Years of education: Not on file  . Highest education level: High  school graduate  Occupational History  . Occupation: installs signs    Employer: [employer]  . Financial resource strain: Not hard at all  . Food insecurity:    Worry: Never true    Inability: Never true  . Transportation needs:    Medical: No    Non-medical: No  Tobacco Use  . Smoking status: Former Smoker    Years: 35.00    Types: Cigarettes    Last attempt to quit: 10/27/2005    Years since quitting: 12.6  . Smokeless tobacco: Never Used  Substance and Sexual Activity  . Alcohol use: Yes    Comment: once a month-beer   . Drug use: No  . Sexual activity: Not on file  Lifestyle  . Physical activity:    Days per week: 5 days    Minutes per session: 30 min  . Stress: Not at all  Relationships  . Social connections:    Talks on phone: Not on file    Gets together: Not on file    Attends religious service: Not on file    Active member of club or organization: Not on file    Attends meetings of clubs or organizations: Not on file    Relationship status: Not on file  Other Topics Concern  . Not on file  Social History Narrative  . Not on file     Family History: The patient's family history includes Cancer in his brother; Colon cancer (age of onset: 3) in his brother; Coronary artery disease in his brother and mother; Stroke in his mother. There is no history of Diabetes or Stomach cancer.  ROS:   Please see the history of present illness.    All other systems reviewed and are negative.  EKGs/Labs/Other Studies Reviewed:    The following studies were reviewed today:  EKG:  Not performed today  Recent Labs: 04/12/2018: ALT 32 04/13/2018: Magnesium 2.4 04/15/2018: BUN 9; Creatinine, Ser 1.12; Hemoglobin 11.6; Platelets 222; Potassium 3.5; Sodium 137  Recent Lipid Panel    Component Value Date/Time   CHOL 137 04/13/2018 0415   TRIG 83 04/13/2018 0415   HDL 31 (L) 04/13/2018 0415   CHOLHDL 4.4 04/13/2018 0415   VLDL 17 04/13/2018 0415    LDLCALC 89 04/13/2018 0415   LDLDIRECT 171.0 12/25/2012 0950    Physical Exam:    VS:  BP 120/68   Pulse 65   Ht 5\' 10"  (1.778 m)   Wt 224 lb 3.2 oz (101.7 kg)   BMI 32.17 kg/m     Wt Readings from Last 3 Encounters:  07/01/18 214 lb 8.1 oz (97.3 kg)  06/29/18 218 lb (98.9 kg)  06/11/18 220 lb 7.4 oz (100 kg)     Constitutional: No acute distress Chest: well healing sternotomy scar Cardiovascular: regular rhythm, normal rate, no murmurs. S1 and S2 normal. Radial pulses normal bilaterally. No jugular venous distention.  Respiratory: clear to auscultation bilaterally GI : normal bowel sounds, soft and nontender.  No distention.   MSK: extremities warm, well perfused. No edema.  NEURO: grossly nonfocal exam, moves all extremities. PSYCH: alert and oriented x 3, normal mood and affect.   ASSESSMENT:    1. Statin intolerance   2. S/P emergency CABG x 2   3. Coronary artery disease involving native coronary artery of native heart without angina pectoris   4. Essential hypertension   5. Hypercholesterolemia   6. Impaired fasting glucose    PLAN:    Statin intolerance - he has a history of statin intolerance and is having symptoms that are limiting his ability to have dexterity in his hands. We will promptly refer to lipid clinic to get lipids to goal with CAD.  CAD - on metoprolol and chest pain free. BP well controlled so no current need for addition of ACE/ARB.  Bilateral hand pain - will prescribe limited supply of diclofenac gel reviewed with pharmacist prior to prescribing.   Medication Adjustments/Labs and Tests Ordered: Current medicines are reviewed at length with the patient today.  Concerns regarding medicines are outlined above.  Orders Placed This Encounter  Procedures  . AMB Referral to [facility] ordered this encounter  Medications  . nitroGLYCERIN (NITROSTAT) 0.4 MG SL tablet    Sig: Place 1 tablet (0.4 mg total) under the  tongue every 5 (five) minutes as needed for chest pain.    Dispense:  25 tablet    Refill:  3  . diclofenac sodium (VOLTAREN) 1 % GEL    Sig: Apply 2 g topically 4 (four) times daily. On the affected hand    Dispense:  1 Tube    Refill:  1    Further refills request will need to be sent to primary care    Patient Instructions  Medication Instructions:  Your physician recommends that you continue on your current medications as directed. Please refer to the Current Medication list given to you today.  A prescription for sublingual Nitro-glycerin has been sent to your pharmacy. Use as directed.  A prescription for Voltaren gel has been sent to your pharmacy. Use as directed. Further refill request will need to be sent to your primary care physician.  If you need a refill on your cardiac medications before your next appointment, please call your pharmacy.   Lab work: None ordered If you have labs (blood work) drawn today and your tests are completely normal, you will receive your results only by: Marland Kitchen MyChart Message (if you have MyChart) OR . A paper copy in the mail If you have any lab test that is abnormal or we need to change your treatment, we will call you to review the results.  Testing/Procedures: None ordered  Follow-Up: At [facility], you and your health needs are our priority.  As part of our continuing mission to provide you with exceptional heart care, we have created designated Provider Care Teams.  These Care Teams include your primary Cardiologist (physician) and Advanced Practice Providers (APPs -  Physician Assistants and Nurse Practitioners) who all work together to provide you with the care you need, when you need it.  Follow up as planned with Dr. [name] on 07/31/18 @ 9:40am  Any Other Special Instructions Will Be Listed Below (If Applicable). You have been referred to [facility] with the Pharmacist       Nitroglycerin sublingual tablets What is this  medicine? NITROGLYCERIN (nye troe GLI ser in) is a type of vasodilator. It relaxes blood vessels,  increasing the blood and oxygen supply to your heart. This medicine is used to relieve chest pain caused by angina. It is also used to prevent chest pain before activities like climbing stairs, going outdoors in cold weather, or sexual activity. This medicine may be used for other purposes; ask your health care provider or pharmacist if you have questions. COMMON BRAND NAME(S): Nitroquick, Nitrostat, Nitrotab What should I tell my health care provider before I take this medicine? They need to know if you have any of these conditions: -anemia -head injury, recent stroke, or bleeding in the brain -liver disease -previous heart attack -an unusual or allergic reaction to nitroglycerin, other medicines, foods, dyes, or preservatives -pregnant or trying to get pregnant -breast-feeding How should I use this medicine? Take this medicine by mouth as needed. At the first sign of an angina attack (chest pain or tightness) place one tablet under your tongue. You can also take this medicine 5 to 10 minutes before an event likely to produce chest pain. Follow the directions on the prescription label. Let the tablet dissolve under the tongue. Do not swallow whole. Replace the dose if you accidentally swallow it. It will help if your mouth is not dry. Saliva around the tablet will help it to dissolve more quickly. Do not eat or drink, smoke or chew tobacco while a tablet is dissolving. If you are not better within 5 minutes after taking ONE dose of nitroglycerin, call 9-1-1 immediately to seek emergency medical care. Do not take more than 3 nitroglycerin tablets over 15 minutes. If you take this medicine often to relieve symptoms of angina, your doctor or health care professional may provide you with different instructions to manage your symptoms. If symptoms do not go away after following these instructions, it is  important to call 9-1-1 immediately. Do not take more than 3 nitroglycerin tablets over 15 minutes. Talk to your pediatrician regarding the use of this medicine in children. Special care may be needed. Overdosage: If you think you have taken too much of this medicine contact a poison control center or emergency room at once. NOTE: This medicine is only for you. Do not share this medicine with others. What if I miss a dose? This does not apply. This medicine is only used as needed. What may interact with this medicine? Do not take this medicine with any of the following medications: -certain migraine medicines like ergotamine and dihydroergotamine (DHE) -medicines used to treat erectile dysfunction like sildenafil, tadalafil, and vardenafil -riociguat This medicine may also interact with the following medications: -alteplase -aspirin -heparin -medicines for high blood pressure -medicines for mental depression -other medicines used to treat angina -phenothiazines like chlorpromazine, mesoridazine, prochlorperazine, thioridazine This list may not describe all possible interactions. Give your health care provider a list of all the medicines, herbs, non-prescription drugs, or dietary supplements you use. Also tell them if you smoke, drink alcohol, or use illegal drugs. Some items may interact with your medicine. What should I watch for while using this medicine? Tell your doctor or health care professional if you feel your medicine is no longer working. Keep this medicine with you at all times. Sit or lie down when you take your medicine to prevent falling if you feel dizzy or faint after using it. Try to remain calm. This will help you to feel better faster. If you feel dizzy, take several deep breaths and lie down with your feet propped up, or bend forward with your head resting between your  knees. You may get drowsy or dizzy. Do not drive, use machinery, or do anything that needs mental  alertness until you know how this drug affects you. Do not stand or sit up quickly, especially if you are an older patient. This reduces the risk of dizzy or fainting spells. Alcohol can make you more drowsy and dizzy. Avoid alcoholic drinks. Do not treat yourself for coughs, colds, or pain while you are taking this medicine without asking your doctor or health care professional for advice. Some ingredients may increase your blood pressure. What side effects may I notice from receiving this medicine? Side effects that you should report to your doctor or health care professional as soon as possible: -blurred vision -dry mouth -skin rash -sweating -the feeling of extreme pressure in the head -unusually weak or tired Side effects that usually do not require medical attention (report to your doctor or health care professional if they continue or are bothersome): -flushing of the face or neck -headache -irregular heartbeat, palpitations -nausea, vomiting This list may not describe all possible side effects. Call your doctor for medical advice about side effects. You may report side effects to FDA at 1-800-FDA-1088. Where should I keep my medicine? Keep out of the reach of children. Store at room temperature between 20 and 25 degrees C (68 and 77 degrees F). Store in [facility]. Protect from light and moisture. Keep tightly closed. Throw away any unused medicine after the expiration date. NOTE: This sheet is a summary. It may not cover all possible information. If you have questions about this medicine, talk to your doctor, pharmacist, or health care provider.  2019 Elsevier/Gold Standard (2013-02-11 17:57:36)        Signed, Elouise Munroe, MD  05/27/2018 9:22 AM    [facility]

## 2018-06-02 ENCOUNTER — Ambulatory Visit (INDEPENDENT_AMBULATORY_CARE_PROVIDER_SITE_OTHER): Payer: BLUE CROSS/BLUE SHIELD | Admitting: Pharmacist

## 2018-06-02 VITALS — BP 110/70 | HR 74 | Resp 16 | Wt 223.6 lb

## 2018-06-02 DIAGNOSIS — E785 Hyperlipidemia, unspecified: Secondary | ICD-10-CM | POA: Diagnosis not present

## 2018-06-02 NOTE — Patient Instructions (Signed)
Lipid Clinic (pharmacist) Coco Sharpnack/[facility]rist[facility] [phone]  *START paperwork for Repatha 140mg  Sureclick every 14 days* *CONTINUE positive lifestyle modifications*   Cholesterol  Cholesterol is a fat. Your body needs a small amount of cholesterol. Cholesterol (plaque) may build up in your blood vessels (arteries). That makes you more likely to have a heart attack or stroke. You cannot feel your cholesterol level. Having a blood test is the only way to find out if your level is high. Keep your test results. Work with your doctor to keep your cholesterol at a good level. What do the results mean?  Total cholesterol is how much cholesterol is in your blood.  LDL is bad cholesterol. This is the type that can build up. Try to have low LDL.  HDL is good cholesterol. It cleans your blood vessels and carries LDL away. Try to have high HDL.  Triglycerides are fat that the body can store or burn for energy. What are good levels of cholesterol?  Total cholesterol below 200.  LDL below 100 is good for people who have health risks. LDL below 70 is good for people who have very high risks.  HDL above 40 is good. It is best to have HDL of 60 or higher.  Triglycerides below 150. How can I lower my cholesterol? Diet Follow your diet program as told by your doctor.  Choose fish, white meat chicken, or [place] that is roasted or baked. Try not to eat red meat, fried foods, sausage, or lunch meats.  Eat lots of fresh fruits and vegetables.  Choose whole grains, beans, pasta, potatoes, and cereals.  Choose olive oil, corn oil, or canola oil. Only use small amounts.  Try not to eat butter, mayonnaise, shortening, or palm kernel oils.  Try not to eat foods with trans fats.  Choose low-fat or nonfat dairy foods. ? Drink skim or nonfat milk. ? Eat low-fat or nonfat yogurt and cheeses. ? Try not to drink whole milk or cream. ? Try not to eat ice cream, egg yolks, or full-fat cheeses.  Healthy  desserts include angel food cake, ginger snaps, animal crackers, hard candy, popsicles, and low-fat or nonfat frozen yogurt. Try not to eat pastries, cakes, pies, and cookies.  Exercise Follow your exercise program as told by your doctor.  Be more active. Try gardening, walking, and taking the stairs.  Ask your doctor about ways that you can be more active. Medicine  Take over-the-counter and prescription medicines only as told by your doctor. This information is not intended to replace advice given to you by your health care provider. Make sure you discuss any questions you have with your health care provider. Document Released: 07/12/2008 Document Revised: 11/15/2015 Document Reviewed: 10/26/2015 Elsevier Interactive Patient Education  2019 [employer].

## 2018-06-02 NOTE — Progress Notes (Signed)
Patient ID: [name]                 DOB: [birth year]                    MRN: [id number]     HPI: [name] is a 63 y.o. male patient referred to lipid clinic by DR [name]. PMH is significant for CABG x 2 on Dec/2019, STEMI, hypertension, and hyperlipidemia. Patient reports severe pain in his hans and arms with statins including rosuvastatin discontinued by cardiothoracic surgeon during most recent f/u appointment. Patient presents accompany by his wife for potential PCSK9i initiation.    Current Medications: none  Intolerances:  Simvastatin - per history Pravastatin - per history Atorvastatin - per history Rosuvastatin 10mg  daily (d/c per Dr. Roxy Manns)  LDL goal: < 70mg /dL  Diet: recently changed to low fat, low sugar diet with > 20lbs weight lost since Dec/2019  Exercise: 7 miles stationary bike, 10-2 miles walks,start cardiac rehab in 1 week  Family History: The patient's family history includes Cancer in his brother; Colon cancer (age of onset: 39) in his brother; Coronary artery disease in his brother and mother; Stroke in his mother.   Social History: The patient  reports that he quit smoking about 12 years ago. His smoking use included cigarettes. He quit after 35.00 years of use. He has never used smokeless tobacco. He reports current alcohol use. He reports that he does not use drugs.   Labs: 04/13/2018: CHO 137; TG 83; HDL 31; LDL-c 89 (while taking rosuvastatin)  Past Medical History:  Diagnosis Date  . CAD (coronary artery disease) 7/07   Cath for unstable angina--- 50-70% LAD, 40% circ  . ED (erectile dysfunction)   . GERD (gastroesophageal reflux disease)   . Hx of colonic polyps   . Hyperlipidemia   . Hypertension   . Impaired fasting glucose   . 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 Sleep disorder   . STEMI involving left anterior descending coronary artery ([place]) 04/12/2018  . Wrist fracture     6 foot fall onto  head, concussion, L wrist (comminuted with intra-articular extension) and 2nd finger (middle phalanx) fxs, vertigo    Current Outpatient Medications on File Prior to Visit  Medication Sig Dispense Refill  . aspirin EC 81 MG tablet Take 81 mg by mouth daily.    . clopidogrel (PLAVIX) 75 MG tablet Take 1 tablet (75 mg total) by mouth daily. 30 tablet 1  . metoprolol tartrate (LOPRESSOR) 25 MG tablet Take 0.5 tablets (12.5 mg total) by mouth 2 (two) times daily. 90 tablet 3  . nitroGLYCERIN (NITROSTAT) 0.4 MG SL tablet Place 1 tablet (0.4 mg total) under the tongue every 5 (five) minutes as needed for chest pain. 25 tablet 3  . pantoprazole (PROTONIX) 40 MG tablet TAKE 1 TABLET BY MOUTH EVERY DAY 30 tablet 1  . diclofenac sodium (VOLTAREN) 1 % GEL Apply 2 g topically 4 (four) times daily. On the affected hand (Patient not taking: Reported on 06/02/2018) 1 Tube 1   No current facility-administered medications on file prior to visit.     Allergies  Allergen Reactions  . Atorvastatin     REACTION: muscle aches  . Simvastatin     REACTION: hand pain  . Pravachol [Pravastatin Sodium]     Muscle aching    Hyperlipemia LDL remains above goal for secondary prevention. Patient is  unable to tolerate statin therapy. He developed to muscle and joint pain while on therapy. Previous statin trials include atorvastatin ,simvsatsatin, pravastatin, and rosuvastatin.  Due to recent revascularization, he is a good candidate for PCSK9i therapy in combination with low fat diet and exercise.   Repatha/Praleunt indication, MOA, dosage, administration, storage, common side effects , pre-approval process, and patient assistance programs were discussed during this visit.  Patient and wife are very interested on initiating Repatha 140mg  SureClick. We will work on prior authorization and will follow up with patient as needed.  Plan to repeat fasting blood work 2-3 months after initiating [place].    Mandi Mattioli  Rodriguez-Guzman PharmD, BCPS, [place] [phone] 06/04/2018 7:31 PM

## 2018-06-04 ENCOUNTER — Encounter: Payer: Self-pay | Admitting: Pharmacist

## 2018-06-04 NOTE — Assessment & Plan Note (Signed)
LDL remains above goal for secondary prevention. Patient is unable to tolerate statin therapy. He developed to muscle and joint pain while on therapy. Previous statin trials include atorvastatin ,simvsatsatin, pravastatin, and rosuvastatin.  Due to recent revascularization, he is a good candidate for PCSK9i therapy in combination with low fat diet and exercise.   Repatha/Praleunt indication, MOA, dosage, administration, storage, common side effects , pre-approval process, and patient assistance programs were discussed during this visit.  Patient and wife are very interested on initiating Repatha 140mg  SureClick. We will work on prior authorization and will follow up with patient as needed.  Plan to repeat fasting blood work 2-3 months after initiating [place].

## 2018-06-06 ENCOUNTER — Other Ambulatory Visit: Payer: Self-pay | Admitting: Surgical

## 2018-06-06 ENCOUNTER — Other Ambulatory Visit (HOSPITAL_COMMUNITY): Payer: Self-pay | Admitting: Physician Assistant

## 2018-06-08 ENCOUNTER — Telehealth: Payer: Self-pay

## 2018-06-08 ENCOUNTER — Telehealth (HOSPITAL_COMMUNITY): Payer: Self-pay

## 2018-06-08 NOTE — Progress Notes (Signed)
[name] 63 y.o. male DOB 11/11/1955 MRN [id number]       Nutrition Screen Note  No diagnosis found. Past Medical History:  Diagnosis Date  . CAD (coronary artery disease) 7/07   Cath for unstable angina--- 50-70% LAD, 40% circ  . ED (erectile dysfunction)   . GERD (gastroesophageal reflux disease)   . Hx of colonic polyps   . Hyperlipidemia   . Hypertension   . Impaired fasting glucose   . 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 Sleep disorder   . STEMI involving left anterior descending coronary artery ([place]) 04/12/2018  . Wrist fracture     6 foot fall onto head, concussion, L wrist (comminuted with intra-articular extension) and 2nd finger (middle phalanx) fxs, vertigo   Meds reviewed.     Current Outpatient Medications (Cardiovascular):  .  metoprolol tartrate (LOPRESSOR) 25 MG tablet, Take 0.5 tablets (12.5 mg total) by mouth 2 (two) times daily. .  nitroGLYCERIN (NITROSTAT) 0.4 MG SL tablet, Place 1 tablet (0.4 mg total) under the tongue every 5 (five) minutes as needed for chest pain.   Current Outpatient Medications (Analgesics):  .  aspirin EC 81 MG tablet, Take 81 mg by mouth daily.  Current Outpatient Medications (Hematological):  .  clopidogrel (PLAVIX) 75 MG tablet, Take 1 tablet (75 mg total) by mouth daily.  Current Outpatient Medications (Other):  .  diclofenac sodium (VOLTAREN) 1 % GEL, Apply 2 g topically 4 (four) times daily. On the affected hand (Patient not taking: Reported on 06/02/2018) .  pantoprazole (PROTONIX) 40 MG tablet, TAKE 1 TABLET BY MOUTH EVERY DAY   HT: Ht Readings from Last 1 Encounters:  05/27/18 5\' 10"  (1.778 m)    WT: Wt Readings from Last 5 Encounters:  06/02/18 223 lb 9.6 oz (101.4 kg)  05/27/18 224 lb 3.2 oz (101.7 kg)  05/19/18 225 lb (102.1 kg)  05/18/18 234 lb (106.1 kg)  04/30/18 234 lb (106.1 kg)     BMI = 32.08  06/02/18  Current tobacco use? No       Labs:  Lipid  Panel     Component Value Date/Time   CHOL 137 04/13/2018 0415   TRIG 83 04/13/2018 0415   HDL 31 (L) 04/13/2018 0415   CHOLHDL 4.4 04/13/2018 0415   VLDL 17 04/13/2018 0415   LDLCALC 89 04/13/2018 0415   LDLDIRECT 171.0 12/25/2012 0950    Lab Results  Component Value Date   HGBA1C 6.2 (H) 04/12/2018   CBG (last 3)  No results for input(s): GLUCAP in the last 72 hours.  Nutrition Diagnosis ? Food-and nutrition-related knowledge deficit related to lack of exposure to information as related to diagnosis of: ? CVD ? Pre-diabetes ? Obese  I = 30-34.9 related to excessive energy intake as evidenced by a  BMI = 32.08   06/02/18  Nutrition Goal(s):  ? To be determined  Plan:  Pt to attend nutrition classes ? Nutrition I ? Nutrition II ? Portion Distortion  ? Diabetes Blitz ? Diabetes Q & A Will provide client-centered nutrition education as part of interdisciplinary care.   Monitor and evaluate progress toward nutrition goal with team.  Laurina Bustle, MS, RD, LDN 06/08/2018 8:25 AM

## 2018-06-08 NOTE — Telephone Encounter (Signed)
Cardiac Rehab Medication Review by a Pharmacist  Does the patient  feel that his/her medications are working for him/her?  yes  Has the patient been experiencing any side effects to the medications prescribed?  no  Does the patient measure his/her own blood pressure or blood glucose at home?  yes ; 125/70, elevated more after exercise   Does the patient have any problems obtaining medications due to transportation or finances?   no  Understanding of regimen: good Understanding of indications: good Potential of compliance: good    Pharmacist comments: Patient will start Repatha soon pending PA approval. Patient has some hand mobility issues since taking statins.     Azzie Roup D PGY1 Pharmacy Resident  Phone [phone] Please use [facility] for clinical pharmacists numbers  06/08/2018      4:48 PM

## 2018-06-08 NOTE — Telephone Encounter (Signed)
Prior auth received for Diclofenac. Complete on cover my meds awaiting decision

## 2018-06-11 ENCOUNTER — Encounter (HOSPITAL_COMMUNITY)
Admission: RE | Admit: 2018-06-11 | Discharge: 2018-06-11 | Disposition: A | Discharge status: Home / Self Care | Payer: BLUE CROSS/BLUE SHIELD | Source: Ambulatory Visit | Attending: Internal Medicine | Admitting: Internal Medicine

## 2018-06-11 ENCOUNTER — Encounter (HOSPITAL_COMMUNITY): Payer: Self-pay

## 2018-06-11 VITALS — BP 124/72 | HR 79 | Ht 69.75 in | Wt 220.5 lb

## 2018-06-11 DIAGNOSIS — Z951 Presence of aortocoronary bypass graft: Secondary | ICD-10-CM | POA: Diagnosis not present

## 2018-06-11 DIAGNOSIS — I2102 ST elevation (STEMI) myocardial infarction involving left anterior descending coronary artery: Secondary | ICD-10-CM

## 2018-06-11 NOTE — Progress Notes (Signed)
[name] 63 y.o. male DOB: 1955/08/30 MRN: [id number]      Nutrition Note  1. STEMI involving left anterior descending coronary artery ([place])   2. S/P emergency CABG x 2    Past Medical History:  Diagnosis Date  . CAD (coronary artery disease) 7/07   Cath for unstable angina--- 50-70% LAD, 40% circ  . ED (erectile dysfunction)   . GERD (gastroesophageal reflux disease)   . Hx of colonic polyps   . Hyperlipidemia   . Hypertension   . Impaired fasting glucose   . 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 Sleep disorder   . STEMI involving left anterior descending coronary artery ([place]) 04/12/2018  . Wrist fracture     6 foot fall onto head, concussion, L wrist (comminuted with intra-articular extension) and 2nd finger (middle phalanx) fxs, vertigo   Meds reviewed.    Current Outpatient Medications (Cardiovascular):  .  metoprolol tartrate (LOPRESSOR) 25 MG tablet, Take 0.5 tablets (12.5 mg total) by mouth 2 (two) times daily. .  nitroGLYCERIN (NITROSTAT) 0.4 MG SL tablet, Place 1 tablet (0.4 mg total) under the tongue every 5 (five) minutes as needed for chest pain. (Patient not taking: Reported on 06/08/2018)   Current Outpatient Medications (Analgesics):  .  aspirin EC 81 MG tablet, Take 81 mg by mouth daily.  Current Outpatient Medications (Hematological):  .  clopidogrel (PLAVIX) 75 MG tablet, Take 1 tablet (75 mg total) by mouth daily.  Current Outpatient Medications (Other):  .  diclofenac sodium (VOLTAREN) 1 % GEL, Apply 2 g topically 4 (four) times daily. On the affected hand (Patient not taking: Reported on 06/08/2018) .  pantoprazole (PROTONIX) 40 MG tablet, TAKE 1 TABLET BY MOUTH EVERY DAY   HT: Ht Readings from Last 1 Encounters:  06/11/18 5' 9.75" (1.772 m)    WT: Wt Readings from Last 5 Encounters:  06/11/18 220 lb 7.4 oz (100 kg)  06/02/18 223 lb 9.6 oz (101.4 kg)  05/27/18 224 lb 3.2 oz (101.7 kg)  05/19/18 225  lb (102.1 kg)  05/18/18 234 lb (106.1 kg)     Body mass index is 31.86 kg/m.   Current tobacco use? No  Labs:  Lipid Panel     Component Value Date/Time   CHOL 137 04/13/2018 0415   TRIG 83 04/13/2018 0415   HDL 31 (L) 04/13/2018 0415   CHOLHDL 4.4 04/13/2018 0415   VLDL 17 04/13/2018 0415   LDLCALC 89 04/13/2018 0415   LDLDIRECT 171.0 12/25/2012 0950    Lab Results  Component Value Date   HGBA1C 6.2 (H) 04/12/2018   CBG (last 3)  No results for input(s): GLUCAP in the last 72 hours.  Nutrition Note Spoke with pt. Nutrition plan and goals reviewed with pt. Pt is following Step 1 of the Therapeutic Lifestyle Changes diet. Pt wants to lose wt. Pt has been trying to lose wt by decreasing consumption of read meat and fried foods. Wt loss tips reviewed (label reading, how to build a healthy plate, portion sizes, eating frequently across the day). Pt last A1C was elevated, 6.2, suspect pt has prediabetes. Discussed the differences between complex and refined carbs, recommended pt replace refined carbs with complex. Reviewed the benefits swapping in complex carbs and moderating portion sizes can have on managing blood glucose with patient. Per discussion, pt does not use canned/convenience foods often. Pt does not add salt to food. Pt does not  eat out frequently. Pt expressed understanding of the information reviewed. Pt aware of nutrition education classes offered and would like to attend nutrition classes.  Nutrition Diagnosis ? Food-and nutrition-related knowledge deficit related to lack of exposure to information as related to diagnosis of: ? CVD ? Pre-diabetes ? Obese  I = 30-34.9 related to excessive energy intake as evidenced by a Body mass index is 31.86 kg/m.  Nutrition Intervention ? Pt's individual nutrition plan and goals reviewed with pt. ? Pt given handouts for: ? Nutrition I class ? Nutrition II class  ? pre-diabetes  Nutrition Goal(s):  ? Pt to identify and limit  food sources of saturated fat, trans fat, refined carbohydrates and sodium ? Pt to identify food quantities necessary to achieve weight loss of 6-24 lb at graduation from cardiac rehab.  ? Pt to watch out for sweets and added sugar ? Pt able to name foods that affect blood glucose  Plan:  ? Pt to attend nutrition classes:  ? Nutrition I ? Nutrition II ? Portion Distortion  ? Diabetes Blitz ? Diabetes Q & Ae determined ? Will provide client-centered nutrition education as part of interdisciplinary care ? Monitor and evaluate progress toward nutrition goal with team.   Laurina Bustle, MS, RD, LDN 06/11/2018 11:01 AM

## 2018-06-11 NOTE — Progress Notes (Signed)
Cardiac Individual Treatment Plan  Patient Details  Name: [name] MRN: [id number] Date of Birth: [birth year] Referring Provider:     [facility] from 06/11/2018 in [place]  Referring Provider  Dr. 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  aspirin EC 81 MG tablet, Take 81 mg by mouth daily., Disp: , Rfl:  .  clopidogrel (PLAVIX) 75 MG tablet, Take 1 tablet (75 mg total) by mouth daily., Disp: 30 tablet, Rfl: 1 .  diclofenac sodium (VOLTAREN) 1 % GEL, Apply 2 g topically 4 (four) times daily. On the affected hand (Patient not taking: Reported on 06/08/2018), Disp: 1 Tube, Rfl: 1 .  metoprolol tartrate (LOPRESSOR) 25 MG tablet, Take 0.5 tablets (12.5 mg total) by mouth 2 (two) times daily., Disp: 90 tablet, Rfl: 3 .  nitroGLYCERIN (NITROSTAT) 0.4 MG SL tablet, Place 1 tablet (0.4 mg total) under the tongue every 5 (five) minutes as needed for chest pain. (Patient not taking: Reported on 06/08/2018), Disp: 25 tablet, Rfl: 3 .  pantoprazole (PROTONIX) 40 MG tablet, TAKE 1 TABLET BY MOUTH EVERY DAY, Disp: 30 tablet, Rfl: 6  Past Medical History: Past Medical History:  Diagnosis Date  . CAD (coronary artery disease) 7/07   Cath for unstable angina--- 50-70% LAD, 40% circ  . ED (erectile dysfunction)   . GERD (gastroesophageal reflux disease)   . Hx of colonic polyps   . Hyperlipidemia   . Hypertension   . Impaired fasting glucose   . 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 Sleep disorder   . STEMI involving left anterior descending coronary artery ([place]) 04/12/2018  .  Wrist fracture     6 foot fall onto head, concussion, L wrist (comminuted with intra-articular extension) and 2nd finger (middle phalanx) fxs, vertigo    Tobacco Use: Social History   Tobacco Use  Smoking Status Former Smoker  . Years: 35.00  . Types: Cigarettes  . Last attempt to quit: 10/27/2005  . Years since quitting: 12.6  Smokeless Tobacco Never Used    Labs: Recent Review Flowsheet Data    Labs for ITP Cardiac and Pulmonary Rehab Latest Ref Rng & Units 04/12/2018 04/13/2018 04/13/2018 04/13/2018 04/13/2018   Cholestrol 0 - 200 mg/dL - 137 - - -   LDLCALC 0 - 99 mg/dL - 89 - - -   LDLDIRECT mg/dL - - - - -   HDL >40 mg/dL - 31(L) - - -   Trlycerides <150 mg/dL - 83 - - -   Hemoglobin A1c 4.8 - 5.6 % - - - - -   PHART 7.350 - 7.450 7.360 - 7.345(L) 7.339(L) -   PCO2ART 32.0 - 48.0 mmHg 41.3 - 43.9 36.8 -   HCO3 20.0 - 28.0 mmol/L 23.6 - 23.9 19.7(L) -   TCO2 22 - 32 mmol/L 25 - 25 21(L) 25   ACIDBASEDEF 0.0 - 2.0 mmol/L 2.0 - 2.0 5.0(H) -   O2SAT % 95.0 - 95.0 89.0 -      Capillary Blood Glucose: Lab Results  Component Value Date  GLUCAP 143 (H) 04/16/2018   GLUCAP 176 (H) 04/16/2018   GLUCAP 118 (H) 04/16/2018   GLUCAP 131 (H) 04/15/2018   GLUCAP 134 (H) 04/15/2018     Exercise Target Goals: Exercise Program Goal: Individual exercise prescription set using results from initial 6 min walk test and THRR while considering  patient's activity barriers and safety.   Exercise Prescription Goal: Initial exercise prescription builds to 30-45 minutes a day of aerobic activity, 2-3 days per week.  Home exercise guidelines will be given to patient during program as part of exercise prescription that the participant will acknowledge.  Activity Barriers & Risk Stratification: Activity Barriers & Cardiac Risk Stratification - 06/11/18 1021      Activity Barriers & Cardiac Risk Stratification   Activity Barriers  None    Cardiac Risk Stratification  High       6  Minute Walk: 6 Minute Walk    Row Name 06/11/18 1019         6 Minute Walk   Phase  Initial     Distance  1687 feet     Walk Time  6 minutes     # of Rest Breaks  0     MPH  3.2     METS  4.08     RPE  11     VO2 Peak  14.28     Symptoms  No     Resting HR  79 bpm     Resting BP  124/72     Resting Oxygen Saturation   96 %     Exercise Oxygen Saturation  during 6 min walk  95 %     Max Ex. HR  119 bpm     Max Ex. BP  150/82     2 Minute Post BP  122/74        Oxygen Initial Assessment:   Oxygen Re-Evaluation:   Oxygen Discharge (Final Oxygen Re-Evaluation):   Initial Exercise Prescription: Initial Exercise Prescription - 06/11/18 1000      Date of Initial Exercise RX and Referring Provider   Date  06/11/18    Referring Provider  Dr. [name]    Expected Discharge Date  09/18/18      Treadmill   MPH  3.4    Grade  0    Minutes  10      Recumbant Bike   Level  2    Watts  65    Minutes  10    METs  4.05      NuStep   Level  3    SPM  85    Minutes  10    METs  4      Prescription Details   Frequency (times per week)  3    Duration  Progress to 30 minutes of continuous aerobic without signs/symptoms of physical distress      Intensity   THRR 40-80% of Max Heartrate  63-126    Ratings of Perceived Exertion  11-13      Progression   Progression  Continue to progress workloads to maintain intensity without signs/symptoms of physical distress.      Resistance Training   Training Prescription  Yes    Weight  5 lbs.     Reps  10-15       Perform Capillary Blood Glucose checks as needed.  Exercise Prescription Changes:   Exercise Comments:   Exercise Goals and Review: Exercise Goals    Row Name  06/11/18 1024             Exercise Goals   Increase Physical Activity  Yes       Intervention  Provide advice, education, support and counseling about physical activity/exercise needs.;Develop an individualized exercise prescription for  aerobic and resistive training based on initial evaluation findings, risk stratification, comorbidities and participant's personal goals.       Expected Outcomes  Short Term: Attend rehab on a regular basis to increase amount of physical activity.       Increase Strength and Stamina  Yes       Intervention  Provide advice, education, support and counseling about physical activity/exercise needs.;Develop an individualized exercise prescription for aerobic and resistive training based on initial evaluation findings, risk stratification, comorbidities and participant's personal goals.       Expected Outcomes  Short Term: Increase workloads from initial exercise prescription for resistance, speed, and METs.       Able to understand and use rate of perceived exertion (RPE) scale  Yes       Intervention  Provide education and explanation on how to use RPE scale       Expected Outcomes  Short Term: Able to use RPE daily in rehab to express subjective intensity level;Long Term:  Able to use RPE to guide intensity level when exercising independently       Knowledge and understanding of Target Heart Rate Range (THRR)  Yes       Intervention  Provide education and explanation of THRR including how the numbers were predicted and where they are located for reference       Expected Outcomes  Short Term: Able to state/look up THRR;Long Term: Able to use THRR to govern intensity when exercising independently;Short Term: Able to use daily as guideline for intensity in rehab       Able to check pulse independently  Yes       Intervention  Provide education and demonstration on how to check pulse in carotid and radial arteries.;Review the importance of being able to check your own pulse for safety during independent exercise       Expected Outcomes  Short Term: Able to explain why pulse checking is important during independent exercise;Long Term: Able to check pulse independently and accurately       Understanding of  Exercise Prescription  Yes       Intervention  Provide education, explanation, and written materials on patient's individual exercise prescription       Expected Outcomes  Short Term: Able to explain program exercise prescription;Long Term: Able to explain home exercise prescription to exercise independently          Exercise Goals Re-Evaluation :   Discharge Exercise Prescription (Final Exercise Prescription Changes):   Nutrition:  Target Goals: Understanding of nutrition guidelines, daily intake of sodium 1500mg , cholesterol 200mg , calories 30% from fat and 7% or less from saturated fats, daily to have 5 or more servings of fruits and vegetables.  Biometrics: Pre Biometrics - 06/11/18 1024      Pre Biometrics   Height  5' 9.75" (1.772 m)    Weight  100 kg    Waist Circumference  41.5 inches    Hip Circumference  45.5 inches    Waist to Hip Ratio  0.91 %    BMI (Calculated)  31.85    Triceps Skinfold  27 mm    % Body Fat  31.8 %    Grip Strength  38  kg    Flexibility  11 in    Single Leg Stand  30 seconds        Nutrition Therapy Plan and Nutrition Goals: Nutrition Therapy & Goals - 06/11/18 1105      Nutrition Therapy   Diet  heart healthy, carb modified      Personal Nutrition Goals   Nutrition Goal  Pt to identify and limit food sources of saturated fat, trans fat, refined carbohydrates and sodium    Personal Goal #2  Pt to identify food quantities necessary to achieve weight loss of 6-24 lb at graduation from cardiac rehab    Personal Goal #3  Pt to watch out for sweets and added sugar    Personal Goal #4  Pt able to name foods that affect blood glucose      Intervention Plan   Intervention  Prescribe, educate and counsel regarding individualized specific dietary modifications aiming towards targeted core components such as weight, hypertension, lipid management, diabetes, heart failure and other comorbidities.    Expected Outcomes  Short Term Goal: Understand  basic principles of dietary content, such as calories, fat, sodium, cholesterol and nutrients.;Long Term Goal: Adherence to prescribed nutrition plan.       Nutrition Assessments: Nutrition Assessments - 06/11/18 1106      MEDFICTS Scores   Pre Score  40       Nutrition Goals Re-Evaluation: Nutrition Goals Re-Evaluation    Row Name 06/11/18 1105             Goals   Current Weight  220 lb 7.4 oz (100 kg)          Nutrition Goals Re-Evaluation: Nutrition Goals Re-Evaluation    [place] Name 06/11/18 1105             Goals   Current Weight  220 lb 7.4 oz (100 kg)          Nutrition Goals Discharge (Final Nutrition Goals Re-Evaluation): Nutrition Goals Re-Evaluation - 06/11/18 1105      Goals   Current Weight  220 lb 7.4 oz (100 kg)       Psychosocial: Target Goals: Acknowledge presence or absence of significant depression and/or stress, maximize coping skills, provide positive support system. Participant is able to verbalize types and ability to use techniques and skills needed for reducing stress and depression.  Initial Review & Psychosocial Screening: Initial Psych Review & Screening - 06/11/18 0823      Initial Review   Current issues with  None Identified      Family Dynamics   Good Support System?  Yes   Pt lists lists his wife and son as sources of support.      Barriers   Psychosocial barriers to participate in program  There are no identifiable barriers or psychosocial needs.      Screening Interventions   Interventions  Encouraged to exercise       Quality of Life Scores: Quality of Life - 06/11/18 2505      Quality of Life   Select  Quality of Life      Quality of Life Scores   Health/Function Pre  19.6 %    Socioeconomic Pre  20.57 %    Psych/Spiritual Pre  28.29 %    Family Pre  27.6 %    GLOBAL Pre  22.76 %      Scores of 19 and below usually indicate a poorer quality of life in these areas.  A difference of  2-3 points is a  clinically meaningful difference.  A difference of 2-3 points in the total score of the Quality of Life Index has been associated with significant improvement in overall quality of life, self-image, physical symptoms, and general health in studies assessing change in quality of life.  PHQ-9: Recent Review Flowsheet Data    Depression screen [facility] 2/9 02/13/2018   Decreased Interest 0   Down, Depressed, Hopeless 0   PHQ - 2 Score 0     Interpretation of Total Score  Total Score Depression Severity:  1-4 = Minimal depression, 5-9 = Mild depression, 10-14 = Moderate depression, 15-19 = Moderately severe depression, 20-27 = Severe depression   Psychosocial Evaluation and Intervention:   Psychosocial Re-Evaluation:   Psychosocial Discharge (Final Psychosocial Re-Evaluation):   Vocational Rehabilitation: Provide vocational rehab assistance to qualifying candidates.   Vocational Rehab Evaluation & Intervention: Vocational Rehab - 06/11/18 0919      Initial Vocational Rehab Evaluation & Intervention   Assessment shows need for Vocational Rehabilitation  No       Education: Education Goals: Education classes will be provided on a weekly basis, covering required topics. Participant will state understanding/return demonstration of topics presented.  Learning Barriers/Preferences: Learning Barriers/Preferences - 06/11/18 1025      Learning Barriers/Preferences   Learning Barriers  Hearing    Learning Preferences  Skilled Demonstration;Individual Instruction       Education Topics: Count Your Pulse:  -Group instruction provided by verbal instruction, demonstration, patient participation and written materials to support subject.  Instructors address importance of being able to find your pulse and how to count your pulse when at home without a heart monitor.  Patients get hands on experience counting their pulse with staff help and individually.   Heart Attack, Angina, and Risk  Factor Modification:  -Group instruction provided by verbal instruction, video, and written materials to support subject.  Instructors address signs and symptoms of angina and heart attacks.    Also discuss risk factors for heart disease and how to make changes to improve heart health risk factors.   Functional Fitness:  -Group instruction provided by verbal instruction, demonstration, patient participation, and written materials to support subject.  Instructors address safety measures for doing things around the house.  Discuss how to get up and down off the floor, how to pick things up properly, how to safely get out of a chair without assistance, and balance training.   Meditation and Mindfulness:  -Group instruction provided by verbal instruction, patient participation, and written materials to support subject.  Instructor addresses importance of mindfulness and meditation practice to help reduce stress and improve awareness.  Instructor also leads participants through a meditation exercise.    Stretching for Flexibility and Mobility:  -Group instruction provided by verbal instruction, patient participation, and written materials to support subject.  Instructors lead participants through series of stretches that are designed to increase flexibility thus improving mobility.  These stretches are additional exercise for major muscle groups that are typically performed during regular warm up and cool down.   Hands Only CPR:  -Group verbal, video, and participation provides a basic overview of AHA guidelines for community CPR. Role-play of emergencies allow participants the opportunity to practice calling for help and chest compression technique with discussion of AED use.   Hypertension: -Group verbal and written instruction that provides a basic overview of hypertension including the most recent diagnostic guidelines, risk factor reduction with self-care instructions and medication  management.  Nutrition I class: Heart Healthy Eating:  -Group instruction provided by PowerPoint slides, verbal discussion, and written materials to support subject matter. The instructor gives an explanation and review of the Therapeutic Lifestyle Changes diet recommendations, which includes a discussion on lipid goals, dietary fat, sodium, fiber, plant stanol/sterol esters, sugar, and the components of a well-balanced, healthy diet.   Nutrition II class: Lifestyle Skills:  -Group instruction provided by PowerPoint slides, verbal discussion, and written materials to support subject matter. The instructor gives an explanation and review of label reading, grocery shopping for heart health, heart healthy recipe modifications, and ways to make healthier choices when eating out.   Diabetes Question & Answer:  -Group instruction provided by PowerPoint slides, verbal discussion, and written materials to support subject matter. The instructor gives an explanation and review of diabetes co-morbidities, pre- and post-prandial blood glucose goals, pre-exercise blood glucose goals, signs, symptoms, and treatment of hypoglycemia and hyperglycemia, and foot care basics.   Diabetes Blitz:  -Group instruction provided by PowerPoint slides, verbal discussion, and written materials to support subject matter. The instructor gives an explanation and review of the physiology behind type 1 and type 2 diabetes, diabetes medications and rational behind using different medications, pre- and post-prandial blood glucose recommendations and Hemoglobin A1c goals, diabetes diet, and exercise including blood glucose guidelines for exercising safely.    Portion Distortion:  -Group instruction provided by PowerPoint slides, verbal discussion, written materials, and food models to support subject matter. The instructor gives an explanation of serving size versus portion size, changes in portions sizes over the last 20 years,  and what consists of a serving from each food group.   Stress Management:  -Group instruction provided by verbal instruction, video, and written materials to support subject matter.  Instructors review role of stress in heart disease and how to cope with stress positively.     Exercising on Your Own:  -Group instruction provided by verbal instruction, power point, and written materials to support subject.  Instructors discuss benefits of exercise, components of exercise, frequency and intensity of exercise, and end points for exercise.  Also discuss use of nitroglycerin and activating EMS.  Review options of places to exercise outside of rehab.  Review guidelines for sex with heart disease.   Cardiac Drugs I:  -Group instruction provided by verbal instruction and written materials to support subject.  Instructor reviews cardiac drug classes: antiplatelets, anticoagulants, beta blockers, and statins.  Instructor discusses reasons, side effects, and lifestyle considerations for each drug class.   Cardiac Drugs II:  -Group instruction provided by verbal instruction and written materials to support subject.  Instructor reviews cardiac drug classes: angiotensin converting enzyme inhibitors (ACE-I), angiotensin II receptor blockers (ARBs), nitrates, and calcium channel blockers.  Instructor discusses reasons, side effects, and lifestyle considerations for each drug class.   Anatomy and Physiology of the Circulatory System:  Group verbal and written instruction and models provide basic cardiac anatomy and physiology, with the coronary electrical and arterial systems. Review of: AMI, Angina, Valve disease, Heart Failure, Peripheral Artery Disease, Cardiac Arrhythmia, Pacemakers, and the ICD.   Other Education:  -Group or individual verbal, written, or video instructions that support the educational goals of the cardiac rehab program.   Holiday Eating Survival Tips:  -Group instruction provided by  PowerPoint slides, verbal discussion, and written materials to support subject matter. The instructor gives patients tips, tricks, and techniques to help them not only survive but enjoy the holidays despite the onslaught of food that  accompanies the holidays.   Knowledge Questionnaire Score: Knowledge Questionnaire Score - 06/11/18 0819      Knowledge Questionnaire Score   Pre Score  22/24       Core Components/Risk Factors/Patient Goals at Admission: Personal Goals and Risk Factors at Admission - 06/11/18 1026      Core Components/Risk Factors/Patient Goals on Admission    Weight Management  Yes;Weight Maintenance;Weight Loss    Intervention  Weight Management: Develop a combined nutrition and exercise program designed to reach desired caloric intake, while maintaining appropriate intake of nutrient and fiber, sodium and fats, and appropriate energy expenditure required for the weight goal.;Weight Management: Provide education and appropriate resources to help participant work on and attain dietary goals.;Weight Management/Obesity: Establish reasonable short term and long term weight goals.    Admit Weight  220 lb 7.4 oz (100 kg)    Expected Outcomes  Short Term: Continue to assess and modify interventions until short term weight is achieved;Long Term: Adherence to nutrition and physical activity/exercise program aimed toward attainment of established weight goal;Weight Maintenance: Understanding of the daily nutrition guidelines, which includes 25-35% calories from fat, 7% or less cal from saturated fats, less than 200mg  cholesterol, less than 1.5gm of sodium, & 5 or more servings of fruits and vegetables daily;Weight Loss: Understanding of general recommendations for a balanced deficit meal plan, which promotes 1-2 lb weight loss per week and includes a negative energy balance of [phone] kcal/d;Understanding recommendations for meals to include 15-35% energy as protein, 25-35% energy from fat,  35-60% energy from carbohydrates, less than 200mg  of dietary cholesterol, 20-35 gm of total fiber daily;Understanding of distribution of calorie intake throughout the day with the consumption of 4-5 meals/snacks    Hypertension  Yes    Intervention  Provide education on lifestyle modifcations including regular physical activity/exercise, weight management, moderate sodium restriction and increased consumption of fresh fruit, vegetables, and low fat dairy, alcohol moderation, and smoking cessation.;Monitor prescription use compliance.    Expected Outcomes  Long Term: Maintenance of blood pressure at goal levels.;Short Term: Continued assessment and intervention until BP is < 140/79mm HG in hypertensive participants. < 130/101mm HG in hypertensive participants with diabetes, heart failure or chronic kidney disease.    Lipids  Yes    Intervention  Provide education and support for participant on nutrition & aerobic/resistive exercise along with prescribed medications to achieve LDL 70mg , HDL >40mg .    Expected Outcomes  Short Term: Participant states understanding of desired cholesterol values and is compliant with medications prescribed. Participant is following exercise prescription and nutrition guidelines.;Long Term: Cholesterol controlled with medications as prescribed, with individualized exercise RX and with personalized nutrition plan. Value goals: LDL < 70mg , HDL > 40 mg.    Stress  Yes    Intervention  Offer individual and/or small group education and counseling on adjustment to heart disease, stress management and health-related lifestyle change. Teach and support self-help strategies.;Refer participants experiencing significant psychosocial distress to appropriate mental health specialists for further evaluation and treatment. When possible, include family members and significant others in education/counseling sessions.    Expected Outcomes  Short Term: Participant demonstrates changes in  health-related behavior, relaxation and other stress management skills, ability to obtain effective social support, and compliance with psychotropic medications if prescribed.;Long Term: Emotional wellbeing is indicated by absence of clinically significant psychosocial distress or social isolation.       Core Components/Risk Factors/Patient Goals Review:    Core Components/Risk Factors/Patient Goals at Discharge (Final Review):  ITP Comments: ITP Comments    Row Name 06/11/18 0926           ITP Comments  Dr. Fransico Him, Medical Director          Comments: Derrick Ravel attended orientation from [phone] to [phone] to review rules and guidelines for program. Completed 6 minute walk test, Intitial ITP, and exercise prescription.  VSS. Telemetry-Sinus Rhythm, first degree heart block, this has been previously documented.  Asymptomatic.Barnet Pall, RN,BSN 06/11/2018 11:21 AM

## 2018-06-15 ENCOUNTER — Other Ambulatory Visit: Payer: Self-pay | Admitting: *Deleted

## 2018-06-15 ENCOUNTER — Telehealth: Payer: Self-pay

## 2018-06-15 MED ORDER — CLOPIDOGREL BISULFATE 75 MG PO TABS: 75.0000 mg | ORAL_TABLET | Freq: Every day | ORAL | 5 refills | Status: DC

## 2018-06-15 NOTE — Telephone Encounter (Signed)
-----   Message from Theodoro Parma, RN sent at 06/15/2018 10:32 AM EST ----- Regarding: FW: Change provider Please see below. The patient will need to stay with Dr. [name]. Thank you! ----- Message ----- From: Sherren Mocha, MD Sent: 06/15/2018   9:24 AM EST To: Theodoro Parma, RN Subject: RE: Change provider                            I don't think I can take him with our current load ----- Message ----- From: Theodoro Parma, RN Sent: 06/15/2018   8:04 AM EST To: Sherren Mocha, MD Subject: RE: Change provider                            Hey, It looks like [name] saw him first and set him up for inpatient cath with you. You rounded on him a couple times after that. He has been seen at [facility] several times since discharge in December, including [name]. Let me know what you want to do. Thanks! ----- Message ----- From: Sherren Mocha, MD Sent: 06/13/2018  12:45 PM EST To: Theodoro Parma, RN Subject: RE: Change provider                            Katy: I haven't responded to this. Is this someone I've seen? ----- Message ----- From: Lamar Laundry, RN Sent: 06/05/2018   8:10 AM EST To: Sherren Mocha, MD, Lamar Laundry, RN, # Subject: Melton Alar: Change provider                             ----- Message ----- From: Harrington Challenger, [facility] Sent: 06/04/2018   7:31 PM EST To: Lamar Laundry, RN, # Subject: Change provider                                Good afternoon!  I saw Mr Joubert this week and he is interested on changing primary cardiologist. He is familiar with Dr Burt Knack and was under the impression Dr Burt Knack was his primary cardiologist.    Please contact Mr Brander with follow up.  Raquel Rodriguez-Guzman PharmD, BCPS, [place] [address] [place], [address] 06/04/2018 7:34 PM

## 2018-06-17 ENCOUNTER — Telehealth: Payer: Self-pay

## 2018-06-17 ENCOUNTER — Telehealth: Payer: Self-pay | Admitting: Internal Medicine

## 2018-06-17 MED ORDER — EVOLOCUMAB 140 MG/ML ~~LOC~~ SOAJ: 140.0000 mg | SUBCUTANEOUS | 6 refills | Status: DC

## 2018-06-17 NOTE — Telephone Encounter (Signed)
New Message ° ° ° ° ° ° ° ° ° °Patient returned your call, would like a call back. °

## 2018-06-17 NOTE — Telephone Encounter (Signed)
Called and spoke with the pt regarding their repatha medication and told them that it was approved by their insurance and sent the rx to their preferred pharmacy. Instructed the pt to call back if the copay is unaffordable and that we would try to get them to applyt for patient assistance with [employer]

## 2018-06-17 NOTE — Telephone Encounter (Signed)
Returned call to patient he stated he already spoke to [facility], already taken care of.

## 2018-06-19 ENCOUNTER — Encounter (HOSPITAL_COMMUNITY)
Admission: RE | Admit: 2018-06-19 | Discharge: 2018-06-19 | Disposition: A | Discharge status: Home / Self Care | Payer: BLUE CROSS/BLUE SHIELD | Source: Ambulatory Visit | Attending: Internal Medicine | Admitting: Internal Medicine

## 2018-06-19 ENCOUNTER — Ambulatory Visit (HOSPITAL_COMMUNITY): Payer: BLUE CROSS/BLUE SHIELD

## 2018-06-19 ENCOUNTER — Encounter (HOSPITAL_COMMUNITY): Payer: BLUE CROSS/BLUE SHIELD

## 2018-06-19 DIAGNOSIS — I2102 ST elevation (STEMI) myocardial infarction involving left anterior descending coronary artery: Secondary | ICD-10-CM | POA: Diagnosis not present

## 2018-06-19 DIAGNOSIS — Z951 Presence of aortocoronary bypass graft: Secondary | ICD-10-CM

## 2018-06-19 NOTE — Progress Notes (Signed)
Daily Session Note  Patient Details  Name: [name] MRN: [id number] Date of Birth: 08-01-1955 Referring Provider:     Jeff Davis from 06/11/2018 in [place]  Referring Provider  Dr. [name]      Encounter Date: 06/19/2018  Check In:   Capillary Blood Glucose: No results found for this or any previous visit (from the past 24 hour(s)).    Social History   Tobacco Use  Smoking Status Former Smoker  . Years: 35.00  . Types: Cigarettes  . Last attempt to quit: 10/27/2005  . Years since quitting: 12.6  Smokeless Tobacco Never Used    Goals Met:  Exercise tolerated well  Goals Unmet:  Not Applicable  Comments: Pt started cardiac rehab today.  Pt tolerated light exercise without difficulty. VSS, telemetry-Sinus rhythm , asymptomatic.  Medication list reconciled. Pt denies barriers to medicaiton compliance.  PSYCHOSOCIAL ASSESSMENT:  PHQ-0. Pt exhibits positive coping skills, hopeful outlook with supportive family. No psychosocial needs identified at this time, no psychosocial interventions necessary.    Pt enjoys exercising and spending time with his grandchildren.   Pt oriented to exercise equipment and routine.    Understanding verbalized.Barnet Pall, RN,BSN 06/19/2018 4:09 PM   Dr. Fransico Him is Medical Director for Cardiac Rehab at [facility].

## 2018-06-22 ENCOUNTER — Encounter (HOSPITAL_COMMUNITY)
Admission: RE | Admit: 2018-06-22 | Discharge: 2018-06-22 | Disposition: A | Discharge status: Home / Self Care | Payer: BLUE CROSS/BLUE SHIELD | Source: Ambulatory Visit | Attending: Internal Medicine | Admitting: Internal Medicine

## 2018-06-22 ENCOUNTER — Encounter (HOSPITAL_COMMUNITY): Payer: BLUE CROSS/BLUE SHIELD

## 2018-06-22 ENCOUNTER — Ambulatory Visit (HOSPITAL_COMMUNITY): Payer: BLUE CROSS/BLUE SHIELD

## 2018-06-22 DIAGNOSIS — Z951 Presence of aortocoronary bypass graft: Secondary | ICD-10-CM

## 2018-06-22 DIAGNOSIS — I2102 ST elevation (STEMI) myocardial infarction involving left anterior descending coronary artery: Secondary | ICD-10-CM

## 2018-06-24 ENCOUNTER — Encounter (HOSPITAL_COMMUNITY)
Admission: RE | Admit: 2018-06-24 | Discharge: 2018-06-24 | Disposition: A | Discharge status: Home / Self Care | Payer: BLUE CROSS/BLUE SHIELD | Source: Ambulatory Visit | Attending: Internal Medicine | Admitting: Internal Medicine

## 2018-06-24 ENCOUNTER — Encounter (HOSPITAL_COMMUNITY): Payer: BLUE CROSS/BLUE SHIELD

## 2018-06-24 ENCOUNTER — Ambulatory Visit (HOSPITAL_COMMUNITY): Payer: BLUE CROSS/BLUE SHIELD

## 2018-06-24 DIAGNOSIS — Z951 Presence of aortocoronary bypass graft: Secondary | ICD-10-CM

## 2018-06-24 DIAGNOSIS — I2102 ST elevation (STEMI) myocardial infarction involving left anterior descending coronary artery: Secondary | ICD-10-CM | POA: Diagnosis not present

## 2018-06-24 LAB — GLUCOSE, CAPILLARY: Glucose-Capillary: 112 mg/dL — ABNORMAL HIGH (ref 70–99)

## 2018-06-24 NOTE — Progress Notes (Signed)
QUALITY OF LIFE SCORE REVIEW  Tarun completed Quality of Life survey as a participant in Cardiac Rehab. Scores 21.0 or below are considered low. Pt score very low in several areas Overall 22.76, Health and Function 19.60, socioeconomic 20.57, physiological and spiritual 28.29, family 27.60. Patient quality of life slightly altered by physical constraints which limits ability to perform as prior to recent cardiac illness. Aran feels like he is getting better.Liana Gerold emotional support and reassurance.  Will continue to monitor and intervene as necessary. Esmond denies being depressed. Kaynen says he hopes to go back to work by the end of March and he will not be participating in the program for the full 3 months.Barnet Pall, RN,BSN 06/24/2018 4:49 PM

## 2018-06-25 ENCOUNTER — Telehealth: Payer: Self-pay

## 2018-06-25 NOTE — Telephone Encounter (Signed)
Patient expressed wanting to switch his cardiology provider from [place] to [facility] [place]. A message was sent to both providers. Patient made aware of Dr.Cooper's response.  From: Sherren Mocha, MD  Sent: 06/15/2018  9:24 AM EST  Subject: RE: Change provider               I don't think I can take him with our current load   Adv pt that he can remain under Dr.Acharya's care or request to switch to another available Physician. Pt sts that he will remain Dr.Acharya's patient.

## 2018-06-26 ENCOUNTER — Encounter (HOSPITAL_COMMUNITY): Payer: BLUE CROSS/BLUE SHIELD

## 2018-06-26 ENCOUNTER — Encounter (HOSPITAL_COMMUNITY)
Admission: RE | Admit: 2018-06-26 | Discharge: 2018-06-26 | Disposition: A | Discharge status: Home / Self Care | Payer: BLUE CROSS/BLUE SHIELD | Source: Ambulatory Visit | Attending: Internal Medicine | Admitting: Internal Medicine

## 2018-06-26 ENCOUNTER — Ambulatory Visit (HOSPITAL_COMMUNITY): Payer: BLUE CROSS/BLUE SHIELD

## 2018-06-26 DIAGNOSIS — Z951 Presence of aortocoronary bypass graft: Secondary | ICD-10-CM

## 2018-06-26 DIAGNOSIS — I2102 ST elevation (STEMI) myocardial infarction involving left anterior descending coronary artery: Secondary | ICD-10-CM

## 2018-06-26 NOTE — Progress Notes (Signed)
Reviewed home exercise guidelines with patient including endpoints, temperature precautions, target heart rate and rate of perceived exertion. Pt is currently using a stationary bike 6-7 days per week for 20 minutes each session. The patient is also walking about 3-4 days per week (as long as weather allows) for about 30 minutes for his home exercise. Pt voices understanding of instructions given.  Sharon Mt, Academic Intern Sol Passer, MS, ACSM CEP

## 2018-06-26 NOTE — Progress Notes (Signed)
Cardiac Individual Treatment Plan  Patient Details  Name: Tejuan Gholson MRN: [id number] Date of Birth: June 27, 1955 Referring Provider:     [facility] from 06/11/2018 in [place]  Referring Provider  Dr. 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  aspirin EC 81 MG tablet, Take 81 mg by mouth daily., Disp: , Rfl:  .  clopidogrel (PLAVIX) 75 MG tablet, Take 1 tablet (75 mg total) by mouth daily., Disp: 30 tablet, Rfl: 5 .  metoprolol tartrate (LOPRESSOR) 25 MG tablet, Take 0.5 tablets (12.5 mg total) by mouth 2 (two) times daily., Disp: 90 tablet, Rfl: 3 .  Multiple Vitamin (MULTIVITAMIN) tablet, Take 1 tablet by mouth daily., Disp: , Rfl:  .  nitroGLYCERIN (NITROSTAT) 0.4 MG SL tablet, Place 1 tablet (0.4 mg total) under the tongue every 5 (five) minutes as needed for chest pain., Disp: 25 tablet, Rfl: 3 .  pantoprazole (PROTONIX) 40 MG tablet, TAKE 1 TABLET BY MOUTH EVERY DAY, Disp: 30 tablet, Rfl: 6 .  diclofenac sodium (VOLTAREN) 1 % GEL, Apply 2 g topically 4 (four) times daily. On the affected hand (Patient not taking: Reported on 06/08/2018), Disp: 1 Tube, Rfl: 1 .  Evolocumab (REPATHA SURECLICK) 644 MG/ML SOAJ, Inject 140 mg into the skin every 14 (fourteen) days. (Patient not taking: Reported on 06/19/2018), Disp: 2 pen, Rfl: 6  Past Medical History: Past Medical History:  Diagnosis Date  . CAD (coronary artery disease) 7/07   Cath for unstable angina--- 50-70% LAD, 40% circ  . ED (erectile dysfunction)   . GERD (gastroesophageal reflux disease)   . Hx of colonic polyps   . Hyperlipidemia   . Hypertension    . Impaired fasting glucose   . 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 Sleep disorder   . STEMI involving left anterior descending coronary artery ([place]) 04/12/2018  . Wrist fracture     6 foot fall onto head, concussion, L wrist (comminuted with intra-articular extension) and 2nd finger (middle phalanx) fxs, vertigo    Tobacco Use: Social History   Tobacco Use  Smoking Status Former Smoker  . Years: 35.00  . Types: Cigarettes  . Last attempt to quit: 10/27/2005  . Years since quitting: 12.6  Smokeless Tobacco Never Used    Labs: Recent Review Flowsheet Data    Labs for ITP Cardiac and Pulmonary Rehab Latest Ref Rng & Units 04/12/2018 04/13/2018 04/13/2018 04/13/2018 04/13/2018   Cholestrol 0 - 200 mg/dL - 137 - - -   LDLCALC 0 - 99 mg/dL - 89 - - -   LDLDIRECT mg/dL - - - - -   HDL >40 mg/dL - 31(L) - - -   Trlycerides <150 mg/dL - 83 - - -   Hemoglobin A1c 4.8 - 5.6 % - - - - -   PHART 7.350 - 7.450 7.360 - 7.345(L) 7.339(L) -   PCO2ART 32.0 - 48.0 mmHg 41.3 - 43.9 36.8 -   HCO3 20.0 - 28.0 mmol/L 23.6 - 23.9 19.7(L) -   TCO2 22 - 32 mmol/L 25 -  25 21(L) 25   ACIDBASEDEF 0.0 - 2.0 mmol/L 2.0 - 2.0 5.0(H) -   O2SAT % 95.0 - 95.0 89.0 -      Capillary Blood Glucose: Lab Results  Component Value Date   GLUCAP 112 (H) 06/24/2018   GLUCAP 143 (H) 04/16/2018   GLUCAP 176 (H) 04/16/2018   GLUCAP 118 (H) 04/16/2018   GLUCAP 131 (H) 04/15/2018     Exercise Target Goals: Exercise Program Goal: Individual exercise prescription set using results from initial 6 min walk test and THRR while considering  patient's activity barriers and safety.   Exercise Prescription Goal: Initial exercise prescription builds to 30-45 minutes a day of aerobic activity, 2-3 days per week.  Home exercise guidelines will be given to patient during program as part of exercise prescription that the participant will acknowledge.  Activity Barriers & Risk  Stratification: Activity Barriers & Cardiac Risk Stratification - 06/11/18 1021      Activity Barriers & Cardiac Risk Stratification   Activity Barriers  None    Cardiac Risk Stratification  High       6 Minute Walk: 6 Minute Walk    Row Name 06/11/18 1019         6 Minute Walk   Phase  Initial     Distance  1687 feet     Walk Time  6 minutes     # of Rest Breaks  0     MPH  3.2     METS  4.08     RPE  11     VO2 Peak  14.28     Symptoms  No     Resting HR  79 bpm     Resting BP  124/72     Resting Oxygen Saturation   96 %     Exercise Oxygen Saturation  during 6 min walk  95 %     Max Ex. HR  119 bpm     Max Ex. BP  150/82     2 Minute Post BP  122/74        Oxygen Initial Assessment:   Oxygen Re-Evaluation:   Oxygen Discharge (Final Oxygen Re-Evaluation):   Initial Exercise Prescription: Initial Exercise Prescription - 06/11/18 1000      Date of Initial Exercise RX and Referring Provider   Date  06/11/18    Referring Provider  Dr. [name]    Expected Discharge Date  09/18/18      Treadmill   MPH  3.4    Grade  0    Minutes  10      Bike   Level  1    Minutes  10    METs  2.89      Recumbant Bike   Level  --    Watts  --    Minutes  --    METs  --      NuStep   Level  3    SPM  85    Minutes  10    METs  4      Prescription Details   Frequency (times per week)  3    Duration  Progress to 30 minutes of continuous aerobic without signs/symptoms of physical distress      Intensity   THRR 40-80% of Max Heartrate  63-126    Ratings of Perceived Exertion  11-13      Progression   Progression  Continue to progress workloads to maintain intensity without signs/symptoms of  physical distress.      Resistance Training   Training Prescription  Yes    Weight  5 lbs.     Reps  10-15       Perform Capillary Blood Glucose checks as needed.  Exercise Prescription Changes: Exercise Prescription Changes    Row Name 06/22/18 [phone]              Response to Exercise   Blood Pressure (Admit)  120/80       Blood Pressure (Exercise)  148/64       Blood Pressure (Exit)  128/62       Heart Rate (Admit)  81 bpm       Heart Rate (Exercise)  119 bpm       Heart Rate (Exit)  81 bpm       Rating of Perceived Exertion (Exercise)  12       Symptoms  none       Duration  Progress to 30 minutes of  aerobic without signs/symptoms of physical distress       Intensity  THRR unchanged         Progression   Progression  Continue to progress workloads to maintain intensity without signs/symptoms of physical distress.       Average METs  3         Resistance Training   Training Prescription  Yes       Weight  5 lbs.        Reps  10-15       Time  10 Minutes         Interval Training   Interval Training  No         Treadmill   MPH  3       Grade  0       Minutes  10       METs  3.3         Bike   Level  1       Minutes  10       METs  2.91         NuStep   Level  3       SPM  85       Minutes  10       METs  2.9          Exercise Comments: Exercise Comments    Row Name 06/22/18 1020           Exercise Comments  METs reviewed with patient. Patient tolerating low intensity exercise well without c/o.          Exercise Goals and Review: Exercise Goals    Row Name 06/11/18 1024             Exercise Goals   Increase Physical Activity  Yes       Intervention  Provide advice, education, support and counseling about physical activity/exercise needs.;Develop an individualized exercise prescription for aerobic and resistive training based on initial evaluation findings, risk stratification, comorbidities and participant's personal goals.       Expected Outcomes  Short Term: Attend rehab on a regular basis to increase amount of physical activity.       Increase Strength and Stamina  Yes       Intervention  Provide advice, education, support and counseling about physical activity/exercise needs.;Develop an  individualized exercise prescription for aerobic and resistive training based on initial evaluation findings, risk stratification, comorbidities and participant's  personal goals.       Expected Outcomes  Short Term: Increase workloads from initial exercise prescription for resistance, speed, and METs.       Able to understand and use rate of perceived exertion (RPE) scale  Yes       Intervention  Provide education and explanation on how to use RPE scale       Expected Outcomes  Short Term: Able to use RPE daily in rehab to express subjective intensity level;Long Term:  Able to use RPE to guide intensity level when exercising independently       Knowledge and understanding of Target Heart Rate Range (THRR)  Yes       Intervention  Provide education and explanation of THRR including how the numbers were predicted and where they are located for reference       Expected Outcomes  Short Term: Able to state/look up THRR;Long Term: Able to use THRR to govern intensity when exercising independently;Short Term: Able to use daily as guideline for intensity in rehab       Able to check pulse independently  Yes       Intervention  Provide education and demonstration on how to check pulse in carotid and radial arteries.;Review the importance of being able to check your own pulse for safety during independent exercise       Expected Outcomes  Short Term: Able to explain why pulse checking is important during independent exercise;Long Term: Able to check pulse independently and accurately       Understanding of Exercise Prescription  Yes       Intervention  Provide education, explanation, and written materials on patient's individual exercise prescription       Expected Outcomes  Short Term: Able to explain program exercise prescription;Long Term: Able to explain home exercise prescription to exercise independently          Exercise Goals Re-Evaluation : Exercise Goals Re-Evaluation    Row Name 06/22/18 1425  06/26/18 1018           Exercise Goal Re-Evaluation   Exercise Goals Review  Increase Physical Activity;Able to understand and use rate of perceived exertion (RPE) scale  Increase Physical Activity;Able to understand and use rate of perceived exertion (RPE) scale;Knowledge and understanding of Target Heart Rate Range (THRR);Understanding of Exercise Prescription;Increase Strength and Stamina;Able to check pulse independently      Comments  Patient able to understand and use RPE scale appropriately.  Reviewed home exercise guidelines and understands them. Patient will attend pulse couting class today, 06/26/2018.      Expected Outcomes  Increase workloads as tolerated to help improve cardiorespiratory fitness.  Patient will continue to exercise at home 6-7 days a week at least 30 minutes a day. Patient will resume work next week, 07/01/2018.         Discharge Exercise Prescription (Final Exercise Prescription Changes): Exercise Prescription Changes - 06/22/18 0951      Response to Exercise   Blood Pressure (Admit)  120/80    Blood Pressure (Exercise)  148/64    Blood Pressure (Exit)  128/62    Heart Rate (Admit)  81 bpm    Heart Rate (Exercise)  119 bpm    Heart Rate (Exit)  81 bpm    Rating of Perceived Exertion (Exercise)  12    Symptoms  none    Duration  Progress to 30 minutes of  aerobic without signs/symptoms of physical distress    Intensity  THRR unchanged  Progression   Progression  Continue to progress workloads to maintain intensity without signs/symptoms of physical distress.    Average METs  3      Resistance Training   Training Prescription  Yes    Weight  5 lbs.     Reps  10-15    Time  10 Minutes      Interval Training   Interval Training  No      Treadmill   MPH  3    Grade  0    Minutes  10    METs  3.3      Bike   Level  1    Minutes  10    METs  2.91      NuStep   Level  3    SPM  85    Minutes  10    METs  2.9       Nutrition:  Target  Goals: Understanding of nutrition guidelines, daily intake of sodium 1500mg , cholesterol 200mg , calories 30% from fat and 7% or less from saturated fats, daily to have 5 or more servings of fruits and vegetables.  Biometrics: Pre Biometrics - 06/11/18 1024      Pre Biometrics   Height  5' 9.75" (1.772 m)    Weight  100 kg    Waist Circumference  41.5 inches    Hip Circumference  45.5 inches    Waist to Hip Ratio  0.91 %    BMI (Calculated)  31.85    Triceps Skinfold  27 mm    % Body Fat  31.8 %    Grip Strength  38 kg    Flexibility  11 in    Single Leg Stand  30 seconds        Nutrition Therapy Plan and Nutrition Goals: Nutrition Therapy & Goals - 06/11/18 1105      Nutrition Therapy   Diet  heart healthy, carb modified      Personal Nutrition Goals   Nutrition Goal  Pt to identify and limit food sources of saturated fat, trans fat, refined carbohydrates and sodium    Personal Goal #2  Pt to identify food quantities necessary to achieve weight loss of 6-24 lb at graduation from cardiac rehab    Personal Goal #3  Pt to watch out for sweets and added sugar    Personal Goal #4  Pt able to name foods that affect blood glucose      Intervention Plan   Intervention  Prescribe, educate and counsel regarding individualized specific dietary modifications aiming towards targeted core components such as weight, hypertension, lipid management, diabetes, heart failure and other comorbidities.    Expected Outcomes  Short Term Goal: Understand basic principles of dietary content, such as calories, fat, sodium, cholesterol and nutrients.;Long Term Goal: Adherence to prescribed nutrition plan.       Nutrition Assessments: Nutrition Assessments - 06/11/18 1106      MEDFICTS Scores   Pre Score  40       Nutrition Goals Re-Evaluation: Nutrition Goals Re-Evaluation    [place] Name 06/11/18 1105             Goals   Current Weight  220 lb 7.4 oz (100 kg)          Nutrition Goals  Re-Evaluation: Nutrition Goals Re-Evaluation    [place] Name 06/11/18 1105             Goals   Current Weight  220 lb 7.4  oz (100 kg)          Nutrition Goals Discharge (Final Nutrition Goals Re-Evaluation): Nutrition Goals Re-Evaluation - 06/11/18 1105      Goals   Current Weight  220 lb 7.4 oz (100 kg)       Psychosocial: Target Goals: Acknowledge presence or absence of significant depression and/or stress, maximize coping skills, provide positive support system. Participant is able to verbalize types and ability to use techniques and skills needed for reducing stress and depression.  Initial Review & Psychosocial Screening: Initial Psych Review & Screening - 06/11/18 0823      Initial Review   Current issues with  None Identified      Family Dynamics   Good Support System?  Yes   Pt lists lists his wife and son as sources of support.      Barriers   Psychosocial barriers to participate in program  There are no identifiable barriers or psychosocial needs.      Screening Interventions   Interventions  Encouraged to exercise       Quality of Life Scores: Quality of Life - 06/11/18 4098      Quality of Life   Select  Quality of Life      Quality of Life Scores   Health/Function Pre  19.6 %    Socioeconomic Pre  20.57 %    Psych/Spiritual Pre  28.29 %    Family Pre  27.6 %    GLOBAL Pre  22.76 %      Scores of 19 and below usually indicate a poorer quality of life in these areas.  A difference of  2-3 points is a clinically meaningful difference.  A difference of 2-3 points in the total score of the Quality of Life Index has been associated with significant improvement in overall quality of life, self-image, physical symptoms, and general health in studies assessing change in quality of life.  PHQ-9: Recent Review Flowsheet Data    Depression screen [facility] 2/9 06/26/2018 06/19/2018 02/13/2018   Decreased Interest 0 0 0   Down, Depressed, Hopeless 0 0 0   PHQ - 2  Score 0 0 0     Interpretation of Total Score  Total Score Depression Severity:  1-4 = Minimal depression, 5-9 = Mild depression, 10-14 = Moderate depression, 15-19 = Moderately severe depression, 20-27 = Severe depression   Psychosocial Evaluation and Intervention:   Psychosocial Re-Evaluation:   Psychosocial Discharge (Final Psychosocial Re-Evaluation):   Vocational Rehabilitation: Provide vocational rehab assistance to qualifying candidates.   Vocational Rehab Evaluation & Intervention: Vocational Rehab - 06/11/18 0919      Initial Vocational Rehab Evaluation & Intervention   Assessment shows need for Vocational Rehabilitation  No       Education: Education Goals: Education classes will be provided on a weekly basis, covering required topics. Participant will state understanding/return demonstration of topics presented.  Learning Barriers/Preferences: Learning Barriers/Preferences - 06/11/18 1025      Learning Barriers/Preferences   Learning Barriers  Hearing    Learning Preferences  Skilled Demonstration;Individual Instruction       Education Topics: Count Your Pulse:  -Group instruction provided by verbal instruction, demonstration, patient participation and written materials to support subject.  Instructors address importance of being able to find your pulse and how to count your pulse when at home without a heart monitor.  Patients get hands on experience counting their pulse with staff help and individually.   CARDIAC REHAB PHASE II EXERCISE from  06/26/2018 in [place]  Date  06/26/18  Instruction Review Code  2- Demonstrated Understanding      Heart Attack, Angina, and Risk Factor Modification:  -Group instruction provided by verbal instruction, video, and written materials to support subject.  Instructors address signs and symptoms of angina and heart attacks.    Also discuss risk factors for heart disease and how to make  changes to improve heart health risk factors.   Functional Fitness:  -Group instruction provided by verbal instruction, demonstration, patient participation, and written materials to support subject.  Instructors address safety measures for doing things around the house.  Discuss how to get up and down off the floor, how to pick things up properly, how to safely get out of a chair without assistance, and balance training.   Meditation and Mindfulness:  -Group instruction provided by verbal instruction, patient participation, and written materials to support subject.  Instructor addresses importance of mindfulness and meditation practice to help reduce stress and improve awareness.  Instructor also leads participants through a meditation exercise.    Stretching for Flexibility and Mobility:  -Group instruction provided by verbal instruction, patient participation, and written materials to support subject.  Instructors lead participants through series of stretches that are designed to increase flexibility thus improving mobility.  These stretches are additional exercise for major muscle groups that are typically performed during regular warm up and cool down.   Hands Only CPR:  -Group verbal, video, and participation provides a basic overview of AHA guidelines for community CPR. Role-play of emergencies allow participants the opportunity to practice calling for help and chest compression technique with discussion of AED use.   Hypertension: -Group verbal and written instruction that provides a basic overview of hypertension including the most recent diagnostic guidelines, risk factor reduction with self-care instructions and medication management.    Nutrition I class: Heart Healthy Eating:  -Group instruction provided by PowerPoint slides, verbal discussion, and written materials to support subject matter. The instructor gives an explanation and review of the Therapeutic Lifestyle Changes  diet recommendations, which includes a discussion on lipid goals, dietary fat, sodium, fiber, plant stanol/sterol esters, sugar, and the components of a well-balanced, healthy diet.   Nutrition II class: Lifestyle Skills:  -Group instruction provided by PowerPoint slides, verbal discussion, and written materials to support subject matter. The instructor gives an explanation and review of label reading, grocery shopping for heart health, heart healthy recipe modifications, and ways to make healthier choices when eating out.   Diabetes Question & Answer:  -Group instruction provided by PowerPoint slides, verbal discussion, and written materials to support subject matter. The instructor gives an explanation and review of diabetes co-morbidities, pre- and post-prandial blood glucose goals, pre-exercise blood glucose goals, signs, symptoms, and treatment of hypoglycemia and hyperglycemia, and foot care basics.   Diabetes Blitz:  -Group instruction provided by PowerPoint slides, verbal discussion, and written materials to support subject matter. The instructor gives an explanation and review of the physiology behind type 1 and type 2 diabetes, diabetes medications and rational behind using different medications, pre- and post-prandial blood glucose recommendations and Hemoglobin A1c goals, diabetes diet, and exercise including blood glucose guidelines for exercising safely.    Portion Distortion:  -Group instruction provided by PowerPoint slides, verbal discussion, written materials, and food models to support subject matter. The instructor gives an explanation of serving size versus portion size, changes in portions sizes over the last 20 years, and what consists of a  serving from each food group.   Stress Management:  -Group instruction provided by verbal instruction, video, and written materials to support subject matter.  Instructors review role of stress in heart disease and how to cope with  stress positively.     Exercising on Your Own:  -Group instruction provided by verbal instruction, power point, and written materials to support subject.  Instructors discuss benefits of exercise, components of exercise, frequency and intensity of exercise, and end points for exercise.  Also discuss use of nitroglycerin and activating EMS.  Review options of places to exercise outside of rehab.  Review guidelines for sex with heart disease.   Cardiac Drugs I:  -Group instruction provided by verbal instruction and written materials to support subject.  Instructor reviews cardiac drug classes: antiplatelets, anticoagulants, beta blockers, and statins.  Instructor discusses reasons, side effects, and lifestyle considerations for each drug class.   Cardiac Drugs II:  -Group instruction provided by verbal instruction and written materials to support subject.  Instructor reviews cardiac drug classes: angiotensin converting enzyme inhibitors (ACE-I), angiotensin II receptor blockers (ARBs), nitrates, and calcium channel blockers.  Instructor discusses reasons, side effects, and lifestyle considerations for each drug class.   Anatomy and Physiology of the Circulatory System:  Group verbal and written instruction and models provide basic cardiac anatomy and physiology, with the coronary electrical and arterial systems. Review of: AMI, Angina, Valve disease, Heart Failure, Peripheral Artery Disease, Cardiac Arrhythmia, Pacemakers, and the ICD.   Other Education:  -Group or individual verbal, written, or video instructions that support the educational goals of the cardiac rehab program.   Holiday Eating Survival Tips:  -Group instruction provided by PowerPoint slides, verbal discussion, and written materials to support subject matter. The instructor gives patients tips, tricks, and techniques to help them not only survive but enjoy the holidays despite the onslaught of food that accompanies the  holidays.   Knowledge Questionnaire Score: Knowledge Questionnaire Score - 06/11/18 0819      Knowledge Questionnaire Score   Pre Score  22/24       Core Components/Risk Factors/Patient Goals at Admission: Personal Goals and Risk Factors at Admission - 06/11/18 1026      Core Components/Risk Factors/Patient Goals on Admission    Weight Management  Yes;Weight Maintenance;Weight Loss    Intervention  Weight Management: Develop a combined nutrition and exercise program designed to reach desired caloric intake, while maintaining appropriate intake of nutrient and fiber, sodium and fats, and appropriate energy expenditure required for the weight goal.;Weight Management: Provide education and appropriate resources to help participant work on and attain dietary goals.;Weight Management/Obesity: Establish reasonable short term and long term weight goals.    Admit Weight  220 lb 7.4 oz (100 kg)    Expected Outcomes  Short Term: Continue to assess and modify interventions until short term weight is achieved;Long Term: Adherence to nutrition and physical activity/exercise program aimed toward attainment of established weight goal;Weight Maintenance: Understanding of the daily nutrition guidelines, which includes 25-35% calories from fat, 7% or less cal from saturated fats, less than 200mg  cholesterol, less than 1.5gm of sodium, & 5 or more servings of fruits and vegetables daily;Weight Loss: Understanding of general recommendations for a balanced deficit meal plan, which promotes 1-2 lb weight loss per week and includes a negative energy balance of [phone] kcal/d;Understanding recommendations for meals to include 15-35% energy as protein, 25-35% energy from fat, 35-60% energy from carbohydrates, less than 200mg  of dietary cholesterol, 20-35 gm of total fiber daily;Understanding  of distribution of calorie intake throughout the day with the consumption of 4-5 meals/snacks    Hypertension  Yes    Intervention   Provide education on lifestyle modifcations including regular physical activity/exercise, weight management, moderate sodium restriction and increased consumption of fresh fruit, vegetables, and low fat dairy, alcohol moderation, and smoking cessation.;Monitor prescription use compliance.    Expected Outcomes  Long Term: Maintenance of blood pressure at goal levels.;Short Term: Continued assessment and intervention until BP is < 140/26mm HG in hypertensive participants. < 130/39mm HG in hypertensive participants with diabetes, heart failure or chronic kidney disease.    Lipids  Yes    Intervention  Provide education and support for participant on nutrition & aerobic/resistive exercise along with prescribed medications to achieve LDL 70mg , HDL >40mg .    Expected Outcomes  Short Term: Participant states understanding of desired cholesterol values and is compliant with medications prescribed. Participant is following exercise prescription and nutrition guidelines.;Long Term: Cholesterol controlled with medications as prescribed, with individualized exercise RX and with personalized nutrition plan. Value goals: LDL < 70mg , HDL > 40 mg.    Stress  Yes    Intervention  Offer individual and/or small group education and counseling on adjustment to heart disease, stress management and health-related lifestyle change. Teach and support self-help strategies.;Refer participants experiencing significant psychosocial distress to appropriate mental health specialists for further evaluation and treatment. When possible, include family members and significant others in education/counseling sessions.    Expected Outcomes  Short Term: Participant demonstrates changes in health-related behavior, relaxation and other stress management skills, ability to obtain effective social support, and compliance with psychotropic medications if prescribed.;Long Term: Emotional wellbeing is indicated by absence of clinically significant  psychosocial distress or social isolation.       Core Components/Risk Factors/Patient Goals Review:  Goals and Risk Factor Review    Row Name 06/26/18 1058 06/26/18 1700           Core Components/Risk Factors/Patient Goals Review   Personal Goals Review  Hypertension;Lipids;Weight Management/Obesity  -      Review  Goble has done well wiht exercise. Jaquay's vital signs have been stable.  Daryl has done well with exercise. Even's vital signs have been stable. Shaunte thinks he will be completing exercise at cardiac rehab on 07/01/18 to return to work.      Expected Outcomes  Ikeem plans to continue exercise by walking and using his stationary bike at home.  -         Core Components/Risk Factors/Patient Goals at Discharge (Final Review):  Goals and Risk Factor Review - 06/26/18 1700      Core Components/Risk Factors/Patient Goals Review   Review  Rease has done well with exercise. Yasuo's vital signs have been stable. Nelvin thinks he will be completing exercise at cardiac rehab on 07/01/18 to return to work.       ITP Comments: ITP Comments    Row Name 06/11/18 0926           ITP Comments  Dr. Fransico Him, Medical Director          Comments: See ITP comments.Barnet Pall, RN,BSN 06/26/2018 5:07 PM

## 2018-06-29 ENCOUNTER — Encounter (HOSPITAL_COMMUNITY): Payer: BLUE CROSS/BLUE SHIELD

## 2018-06-29 ENCOUNTER — Other Ambulatory Visit: Payer: Self-pay

## 2018-06-29 ENCOUNTER — Ambulatory Visit (HOSPITAL_COMMUNITY): Payer: BLUE CROSS/BLUE SHIELD

## 2018-06-29 ENCOUNTER — Encounter: Payer: Self-pay | Admitting: Thoracic Surgery (Cardiothoracic Vascular Surgery)

## 2018-06-29 ENCOUNTER — Ambulatory Visit (INDEPENDENT_AMBULATORY_CARE_PROVIDER_SITE_OTHER): Payer: Self-pay | Admitting: Thoracic Surgery (Cardiothoracic Vascular Surgery)

## 2018-06-29 VITALS — BP 126/75 | HR 72 | Resp 18 | Ht 70.0 in | Wt 218.0 lb

## 2018-06-29 DIAGNOSIS — Z951 Presence of aortocoronary bypass graft: Secondary | ICD-10-CM

## 2018-06-29 NOTE — Progress Notes (Signed)
[address]       [place] [address]             [phone]     CARDIOTHORACIC SURGERY OFFICE NOTE  Referring Provider is Sherren Mocha, MD Primary Cardiologist is Elouise Munroe, MD PCP is Venia Carbon, MD   HPI:  Patient is a 63 year old male with known history of coronary artery disease, hypertension, hyperlipidemia, type 2 diabetes mellitus, and GE reflux disease who returns to the office today for routine follow-up status post emergency coronary artery bypass grafting x2 on April 12, 2018 for severe single-vessel coronary artery disease status post acute ST segment elevation myocardial infarction with coronary anatomy unfavorable for percutaneous core intervention.  The patient's early postoperative recovery was uneventful and he was discharged home on the fourth postoperative day.  He was last seen in follow-up in our office on May 18, 2018 at which time he was doing well.  Since then he has been seen in follow-up by Dr. [name] and he returns to our office for routine follow-up today.  He reports that he is doing exceptionally well.  He continues to participate in the cardiac rehab program, and he states that he exercises routinely on his own on days that he does not go to cardiac rehab.  In addition, the patient is riding a stationary bicycle 7 miles twice every day.  He states that he feels much better than he has in a long time.  He has no significant residual pain or soreness in his chest.  He denies any exertional chest discomfort or shortness of breath.  Overall he is delighted with his progress.   Current Outpatient Medications  Medication Sig Dispense Refill  . aspirin EC 81 MG tablet Take 81 mg by mouth daily.    . clopidogrel (PLAVIX) 75 MG tablet Take 1 tablet (75 mg total) by mouth daily. 30 tablet 5  . metoprolol tartrate (LOPRESSOR) 25 MG tablet Take 0.5 tablets (12.5 mg total) by mouth 2 (two) times daily. 90 tablet 3  .  Multiple Vitamin (MULTIVITAMIN) tablet Take 1 tablet by mouth daily.    . nitroGLYCERIN (NITROSTAT) 0.4 MG SL tablet Place 1 tablet (0.4 mg total) under the tongue every 5 (five) minutes as needed for chest pain. 25 tablet 3  . pantoprazole (PROTONIX) 40 MG tablet TAKE 1 TABLET BY MOUTH EVERY DAY 30 tablet 6  . diclofenac sodium (VOLTAREN) 1 % GEL Apply 2 g topically 4 (four) times daily. On the affected hand (Patient not taking: Reported on 06/29/2018) 1 Tube 1  . Evolocumab (REPATHA SURECLICK) 604 MG/ML SOAJ Inject 140 mg into the skin every 14 (fourteen) days. (Patient not taking: Reported on 06/29/2018) 2 pen 6   No current facility-administered medications for this visit.       Physical Exam:   BP 126/75 (BP Location: Left Arm, Patient Position: Sitting, Cuff Size: Large)   Pulse 72   Resp 18   Ht 5\' 10"  (1.778 m)   Wt 218 lb (98.9 kg)   SpO2 97% Comment: RA  BMI 31.28 kg/m   General:  Well-appearing  Chest:   Clear to auscultation  CV:   Regular rate and rhythm without murmur  Incisions:  Completely healed, sternum is stable  Abdomen:  Soft nontender  Extremities:  Warm and well-perfused  Diagnostic Tests:  n/a   Impression:  Patient is doing very well approximately 3 months status post coronary artery bypass grafting  Plan:  We have not recommended any change the patient's current medications.  Typically we would recommend dual antiplatelet therapy for a total of 6 months from the time of the patient's presentation and surgery.  I have encouraged the patient to continue to increase his physical activity without any particular limitations.  He may return to work without restrictions.  All of his questions have been addressed.  The patient will return to our office for routine follow-up next December, approximately 1 year following his surgery.  During the interim he will call and return to see [place] only should specific problems or questions arise.    Valentina Gu. Roxy Manns,  MD 06/29/2018 9:25 AM

## 2018-06-29 NOTE — Patient Instructions (Signed)
Continue all previous medications without any changes at this time  You may resume unrestricted physical activity without any particular limitations at this time.  Make every effort to stay physically active, get some type of exercise on a regular basis, and stick to a "heart healthy diet".  The long term benefits for regular exercise and a healthy diet are critically important to your overall health and wellbeing.

## 2018-07-01 ENCOUNTER — Encounter (HOSPITAL_COMMUNITY)
Admission: RE | Admit: 2018-07-01 | Discharge: 2018-07-01 | Disposition: A | Discharge status: Home / Self Care | Payer: BLUE CROSS/BLUE SHIELD | Source: Ambulatory Visit | Attending: Internal Medicine | Admitting: Internal Medicine

## 2018-07-01 ENCOUNTER — Ambulatory Visit (HOSPITAL_COMMUNITY): Payer: BLUE CROSS/BLUE SHIELD

## 2018-07-01 ENCOUNTER — Encounter (HOSPITAL_COMMUNITY): Payer: BLUE CROSS/BLUE SHIELD

## 2018-07-01 ENCOUNTER — Telehealth: Payer: Self-pay

## 2018-07-01 VITALS — BP 140/70 | HR 77 | Wt 214.5 lb

## 2018-07-01 DIAGNOSIS — Z951 Presence of aortocoronary bypass graft: Secondary | ICD-10-CM | POA: Diagnosis not present

## 2018-07-01 DIAGNOSIS — I2102 ST elevation (STEMI) myocardial infarction involving left anterior descending coronary artery: Secondary | ICD-10-CM

## 2018-07-01 NOTE — Telephone Encounter (Signed)
Called the pt and let them know that thr repatha was denied through SNF but also instructed the pt to call the [employer] to apply for a grant and to also call us back to let us know whether they are approved or denied and the pt was compliant to try that

## 2018-07-02 NOTE — Progress Notes (Signed)
Discharge Progress Report  Patient Details  Name: [name] MRN: [id number] Date of Birth: [birth year] Referring Provider:     [facility] from 06/11/2018 in [facility]  Referring Provider  Dr. [name]       Number of Visits: 6  Reason for Discharge:  Early Exit:  Back to work  Smoking History:  Social History   Tobacco Use  Smoking Status Former Smoker  . Years: 35.00  . Types: Cigarettes  . Last attempt to quit: 10/27/2005  . 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 [name]    Expected Discharge Date  09/18/18      Treadmill   MPH  3.4    Grade  0    Minutes  10      Bike   Level  1    Minutes  10    METs  2.89      Recumbant Bike   Level  --    Watts  --    Minutes  --    METs  --      NuStep   Level  3    SPM  85    Minutes  10    METs  4      Prescription Details   Frequency (times per week)  3    Duration  Progress to 30 minutes of continuous aerobic without signs/symptoms of physical distress      Intensity   THRR 40-80% of Max Heartrate  63-126    Ratings of Perceived Exertion  11-13      Progression   Progression  Continue to progress workloads to maintain intensity without signs/symptoms of physical distress.      Resistance Training   Training Prescription  Yes    Weight  5 lbs.     Reps  10-15       Discharge Exercise Prescription (Final Exercise Prescription Changes): Exercise Prescription Changes - 07/01/18 0955      Response to Exercise   Blood Pressure (Admit)  140/70    Blood Pressure (Exercise)  168/76    Blood Pressure (Exit)  106/60     Heart Rate (Admit)  77 bpm    Heart Rate (Exercise)  120 bpm    Heart Rate (Exit)  80 bpm    Rating of Perceived Exertion (Exercise)  11    Symptoms  none    Duration  Progress to 30 minutes of  aerobic without signs/symptoms of physical distress    Intensity  THRR unchanged      Progression   Progression  Continue to progress workloads to maintain intensity without signs/symptoms of physical distress.    Average METs  4.1      Resistance Training   Training Prescription  No   Relaxation day, no weights.     Interval Training   Interval Training  No      Treadmill   MPH  3    Grade  0    Minutes  10    METs  3.3      Bike   Level  2.2    Minutes  10    METs  5.27      NuStep   Level  4    SPM  85    Minutes  10    METs  3.7      Home Exercise Plan   Plans to continue exercise at  Home (comment)   Stationary bike at home and walking.   Frequency  Add 4 additional days to program exercise sessions.    Initial Home Exercises Provided  06/26/18       Functional Capacity: 6 Minute Walk    Row Name 06/11/18 1019         6 Minute Walk   Phase  Initial     Distance  1687 feet     Walk Time  6 minutes     # of Rest Breaks  0     MPH  3.2     METS  4.08     RPE  11     VO2 Peak  14.28     Symptoms  No     Resting HR  79 bpm     Resting BP  124/72     Resting Oxygen Saturation   96 %     Exercise Oxygen Saturation  during 6 min walk  95 %     Max Ex. HR  119 bpm     Max Ex. BP  150/82     2 Minute Post BP  122/74        Psychological, QOL, Others - Outcomes: PHQ 2/9: Depression screen [facility] 2/9 06/29/2018 06/26/2018 06/19/2018 02/13/2018  Decreased Interest 0 0 0 0  Down, Depressed, Hopeless 0 0 0 0  PHQ - 2 Score 0 0 0 0    Quality of Life: Quality of Life - 06/11/18 9833      Quality of Life   Select  Quality of Life      Quality of Life Scores   Health/Function Pre  19.6 %    Socioeconomic Pre  20.57 %    Psych/Spiritual Pre  28.29 %     Family Pre  27.6 %    GLOBAL Pre  22.76 %       Personal Goals: Goals established at orientation with interventions provided to work toward goal. Personal Goals and Risk Factors at Admission - 06/11/18 1026      Core Components/Risk Factors/Patient Goals on Admission    Weight Management  Yes;Weight Maintenance;Weight Loss    Intervention  Weight Management: Develop a combined nutrition and exercise program designed to reach desired caloric intake, while maintaining appropriate intake of nutrient and fiber, sodium and fats, and appropriate energy expenditure required for the weight goal.;Weight Management: Provide education and appropriate resources to help participant work on and attain dietary goals.;Weight Management/Obesity: Establish reasonable short term and long term weight goals.    Admit Weight  220 lb 7.4 oz (100 kg)    Expected Outcomes  Short Term: Continue to assess and modify interventions until short term weight is achieved;Long Term: Adherence to nutrition and physical activity/exercise program aimed toward attainment of established weight goal;Weight Maintenance: Understanding of the daily nutrition guidelines, which includes 25-35% calories from fat, 7% or less cal from saturated fats, less than 200mg  cholesterol, less than 1.5gm of sodium, & 5 or more servings of fruits and vegetables daily;Weight Loss: Understanding of general recommendations for a balanced deficit meal plan, which promotes 1-2 lb weight loss per week and  includes a negative energy balance of [phone] kcal/d;Understanding recommendations for meals to include 15-35% energy as protein, 25-35% energy from fat, 35-60% energy from carbohydrates, less than 200mg  of dietary cholesterol, 20-35 gm of total fiber daily;Understanding of distribution of calorie intake throughout the day with the consumption of 4-5 meals/snacks    Hypertension  Yes    Intervention  Provide education on lifestyle modifcations including regular  physical activity/exercise, weight management, moderate sodium restriction and increased consumption of fresh fruit, vegetables, and low fat dairy, alcohol moderation, and smoking cessation.;Monitor prescription use compliance.    Expected Outcomes  Long Term: Maintenance of blood pressure at goal levels.;Short Term: Continued assessment and intervention until BP is < 140/65mm HG in hypertensive participants. < 130/67mm HG in hypertensive participants with diabetes, heart failure or chronic kidney disease.    Lipids  Yes    Intervention  Provide education and support for participant on nutrition & aerobic/resistive exercise along with prescribed medications to achieve LDL 70mg , HDL >40mg .    Expected Outcomes  Short Term: Participant states understanding of desired cholesterol values and is compliant with medications prescribed. Participant is following exercise prescription and nutrition guidelines.;Long Term: Cholesterol controlled with medications as prescribed, with individualized exercise RX and with personalized nutrition plan. Value goals: LDL < 70mg , HDL > 40 mg.    Stress  Yes    Intervention  Offer individual and/or small group education and counseling on adjustment to heart disease, stress management and health-related lifestyle change. Teach and support self-help strategies.;Refer participants experiencing significant psychosocial distress to appropriate mental health specialists for further evaluation and treatment. When possible, include family members and significant others in education/counseling sessions.    Expected Outcomes  Short Term: Participant demonstrates changes in health-related behavior, relaxation and other stress management skills, ability to obtain effective social support, and compliance with psychotropic medications if prescribed.;Long Term: Emotional wellbeing is indicated by absence of clinically significant psychosocial distress or social isolation.        Personal  Goals Discharge: Goals and Risk Factor Review    Row Name 06/26/18 1058 06/26/18 1700 07/02/18 1409         Core Components/Risk Factors/Patient Goals Review   Personal Goals Review  Hypertension;Lipids;Weight Management/Obesity  -  Hypertension;Lipids;Weight Management/Obesity     Review  Kyaire has done well wiht exercise. Yago's vital signs have been stable.  Chayson has done well with exercise. Masin's vital signs have been stable. Elizeo thinks he will be completing exercise at cardiac rehab on 07/01/18 to return to work.  Adrik has done well with exercise. Crue's vital signs have been stable. Surafel thinks he will be completing exercise at cardiac rehab on 07/01/18 to return to work.     Expected Outcomes  Jerome plans to continue exercise by walking and using his stationary bike at home.  Coralyn Mark plans to continue exercise by walking and using his stationary bike at home.        Exercise Goals and Review: Exercise Goals    Row Name 06/11/18 1024             Exercise Goals   Increase Physical Activity  Yes       Intervention  Provide advice, education, support and counseling about physical activity/exercise needs.;Develop an individualized exercise prescription for aerobic and resistive training based on initial evaluation findings, risk stratification, comorbidities and participant's personal goals.       Expected Outcomes  Short Term: Attend rehab on a regular basis to increase  amount of physical activity.       Increase Strength and Stamina  Yes       Intervention  Provide advice, education, support and counseling about physical activity/exercise needs.;Develop an individualized exercise prescription for aerobic and resistive training based on initial evaluation findings, risk stratification, comorbidities and participant's personal goals.       Expected Outcomes  Short Term: Increase workloads from initial exercise prescription for resistance, speed, and METs.       Able to understand  and use rate of perceived exertion (RPE) scale  Yes       Intervention  Provide education and explanation on how to use RPE scale       Expected Outcomes  Short Term: Able to use RPE daily in rehab to express subjective intensity level;Long Term:  Able to use RPE to guide intensity level when exercising independently       Knowledge and understanding of Target Heart Rate Range (THRR)  Yes       Intervention  Provide education and explanation of THRR including how the numbers were predicted and where they are located for reference       Expected Outcomes  Short Term: Able to state/look up THRR;Long Term: Able to use THRR to govern intensity when exercising independently;Short Term: Able to use daily as guideline for intensity in rehab       Able to check pulse independently  Yes       Intervention  Provide education and demonstration on how to check pulse in carotid and radial arteries.;Review the importance of being able to check your own pulse for safety during independent exercise       Expected Outcomes  Short Term: Able to explain why pulse checking is important during independent exercise;Long Term: Able to check pulse independently and accurately       Understanding of Exercise Prescription  Yes       Intervention  Provide education, explanation, and written materials on patient's individual exercise prescription       Expected Outcomes  Short Term: Able to explain program exercise prescription;Long Term: Able to explain home exercise prescription to exercise independently          Exercise Goals Re-Evaluation: Exercise Goals Re-Evaluation    Row Name 06/22/18 1425 06/26/18 1018 07/01/18 1052         Exercise Goal Re-Evaluation   Exercise Goals Review  Increase Physical Activity;Able to understand and use rate of perceived exertion (RPE) scale  Increase Physical Activity;Able to understand and use rate of perceived exertion (RPE) scale;Knowledge and understanding of Target Heart Rate  Range (THRR);Understanding of Exercise Prescription;Increase Strength and Stamina;Able to check pulse independently  Increase Physical Activity;Able to understand and use rate of perceived exertion (RPE) scale;Knowledge and understanding of Target Heart Rate Range (THRR);Understanding of Exercise Prescription;Increase Strength and Stamina;Able to check pulse independently     Comments  Patient able to understand and use RPE scale appropriately.  Reviewed home exercise guidelines and understands them. Patient will attend pulse couting class today, 06/26/2018.  Patient cleared to return to work and will be unable to continue exercise in the cardiac rehab due to work schedule. Pt plans to continue exercise walking and using stationary bike at home.      Expected Outcomes  Increase workloads as tolerated to help improve cardiorespiratory fitness.  Patient will continue to exercise at home 6-7 days a week at least 30 minutes a day. Patient will resume work next week, 07/01/2018.  Patient will exercise at least 30 minutes 6-7 days/week riding stationary bike at home and walking to maintain health and fitness progress achieved.        Nutrition & Weight - Outcomes: Pre Biometrics - 06/11/18 1024      Pre Biometrics   Height  5' 9.75" (1.772 m)    Weight  220 lb 7.4 oz (100 kg)    Waist Circumference  41.5 inches    Hip Circumference  45.5 inches    Waist to Hip Ratio  0.91 %    BMI (Calculated)  31.85    Triceps Skinfold  27 mm    % Body Fat  31.8 %    Grip Strength  38 kg    Flexibility  11 in    Single Leg Stand  30 seconds      Post Biometrics - 07/01/18 0955       Post  Biometrics   Weight  214 lb 8.1 oz (97.3 kg)    BMI (Calculated)  30.78       Nutrition: Nutrition Therapy & Goals - 07/16/18 1130      Nutrition Therapy   Diet  heart healthy, carb modified      Personal Nutrition Goals   Nutrition Goal  Pt to identify and limit food sources of saturated fat, trans fat, refined  carbohydrates and sodium      Intervention Plan   Intervention  Prescribe, educate and counsel regarding individualized specific dietary modifications aiming towards targeted core components such as weight, hypertension, lipid management, diabetes, heart failure and other comorbidities.    Expected Outcomes  Short Term Goal: Understand basic principles of dietary content, such as calories, fat, sodium, cholesterol and nutrients.;Long Term Goal: Adherence to prescribed nutrition plan.       Nutrition Discharge: Nutrition Assessments - 07/16/18 1128      MEDFICTS Scores   Pre Score  40    Post Score  --   pt did not complete post survey      Education Questionnaire Score: Knowledge Questionnaire Score - 06/11/18 0819      Knowledge Questionnaire Score   Pre Score  22/24       Goals reviewed with patient; copy given to patient. Leam attended 6 exercise sessions between 06/11/18-07/01/18. Myquan's attendance was good and he did well with exercise. Milbern is stopping the program early to return to work.Barnet Pall, RN,BSN 07/16/2018 11:34 AM

## 2018-07-03 ENCOUNTER — Encounter (HOSPITAL_COMMUNITY): Payer: BLUE CROSS/BLUE SHIELD

## 2018-07-03 ENCOUNTER — Ambulatory Visit (HOSPITAL_COMMUNITY): Payer: BLUE CROSS/BLUE SHIELD

## 2018-07-05 DIAGNOSIS — K219 Gastro-esophageal reflux disease without esophagitis: Secondary | ICD-10-CM | POA: Insufficient documentation

## 2018-07-05 DIAGNOSIS — R7301 Impaired fasting glucose: Secondary | ICD-10-CM | POA: Insufficient documentation

## 2018-07-06 ENCOUNTER — Ambulatory Visit (HOSPITAL_COMMUNITY): Payer: BLUE CROSS/BLUE SHIELD

## 2018-07-06 ENCOUNTER — Encounter (HOSPITAL_COMMUNITY): Payer: BLUE CROSS/BLUE SHIELD

## 2018-07-08 ENCOUNTER — Encounter (HOSPITAL_COMMUNITY): Payer: BLUE CROSS/BLUE SHIELD

## 2018-07-08 ENCOUNTER — Ambulatory Visit (HOSPITAL_COMMUNITY): Payer: BLUE CROSS/BLUE SHIELD

## 2018-07-10 ENCOUNTER — Encounter (HOSPITAL_COMMUNITY): Payer: BLUE CROSS/BLUE SHIELD

## 2018-07-10 ENCOUNTER — Ambulatory Visit (HOSPITAL_COMMUNITY): Payer: BLUE CROSS/BLUE SHIELD

## 2018-07-13 ENCOUNTER — Ambulatory Visit (HOSPITAL_COMMUNITY): Payer: BLUE CROSS/BLUE SHIELD

## 2018-07-13 ENCOUNTER — Encounter (HOSPITAL_COMMUNITY): Payer: BLUE CROSS/BLUE SHIELD

## 2018-07-15 ENCOUNTER — Telehealth: Payer: Self-pay

## 2018-07-15 ENCOUNTER — Encounter (HOSPITAL_COMMUNITY): Payer: BLUE CROSS/BLUE SHIELD

## 2018-07-15 ENCOUNTER — Ambulatory Visit (HOSPITAL_COMMUNITY): Payer: BLUE CROSS/BLUE SHIELD

## 2018-07-15 NOTE — Telephone Encounter (Signed)
Called the pt left a msg to see if they have made any progress in getting pt assistance for the repatha and to call us back either way to let us know if they were approved or denied

## 2018-07-17 ENCOUNTER — Ambulatory Visit (HOSPITAL_COMMUNITY): Payer: BLUE CROSS/BLUE SHIELD

## 2018-07-17 ENCOUNTER — Encounter (HOSPITAL_COMMUNITY): Payer: BLUE CROSS/BLUE SHIELD

## 2018-07-20 ENCOUNTER — Ambulatory Visit: Payer: BLUE CROSS/BLUE SHIELD | Admitting: Thoracic Surgery (Cardiothoracic Vascular Surgery)

## 2018-07-20 ENCOUNTER — Ambulatory Visit (HOSPITAL_COMMUNITY): Payer: BLUE CROSS/BLUE SHIELD

## 2018-07-20 ENCOUNTER — Encounter (HOSPITAL_COMMUNITY): Payer: BLUE CROSS/BLUE SHIELD

## 2018-07-22 ENCOUNTER — Encounter (HOSPITAL_COMMUNITY): Payer: BLUE CROSS/BLUE SHIELD

## 2018-07-22 ENCOUNTER — Ambulatory Visit (HOSPITAL_COMMUNITY): Payer: BLUE CROSS/BLUE SHIELD

## 2018-07-24 ENCOUNTER — Ambulatory Visit (HOSPITAL_COMMUNITY): Payer: BLUE CROSS/BLUE SHIELD

## 2018-07-24 ENCOUNTER — Encounter (HOSPITAL_COMMUNITY): Payer: BLUE CROSS/BLUE SHIELD

## 2018-07-27 ENCOUNTER — Ambulatory Visit (HOSPITAL_COMMUNITY): Payer: BLUE CROSS/BLUE SHIELD

## 2018-07-27 ENCOUNTER — Encounter (HOSPITAL_COMMUNITY): Payer: BLUE CROSS/BLUE SHIELD

## 2018-07-27 ENCOUNTER — Telehealth: Payer: Self-pay | Admitting: *Deleted

## 2018-07-27 NOTE — Telephone Encounter (Signed)
   Primary Cardiologist:  Elouise Munroe, MD   Patient contacted.  History reviewed.  No symptoms to suggest any unstable cardiac conditions.  Based on discussion, with current pandemic situation, we will be postponing this appointment for [facility] with a plan for f/u in >12 wks or sooner if feasible/necessary.  If symptoms change, he has been instructed to contact our office.   Routing to C19 CANCEL pool for tracking  Raiford Simmonds, RN  07/27/2018 12:10 PM         .

## 2018-07-29 ENCOUNTER — Ambulatory Visit (HOSPITAL_COMMUNITY): Payer: BLUE CROSS/BLUE SHIELD

## 2018-07-29 ENCOUNTER — Encounter (HOSPITAL_COMMUNITY): Payer: BLUE CROSS/BLUE SHIELD

## 2018-07-29 ENCOUNTER — Encounter

## 2018-07-31 ENCOUNTER — Ambulatory Visit (HOSPITAL_COMMUNITY): Payer: BLUE CROSS/BLUE SHIELD

## 2018-07-31 ENCOUNTER — Encounter (HOSPITAL_COMMUNITY): Payer: BLUE CROSS/BLUE SHIELD

## 2018-07-31 ENCOUNTER — Ambulatory Visit: Payer: BLUE CROSS/BLUE SHIELD | Admitting: Internal Medicine

## 2018-08-03 ENCOUNTER — Ambulatory Visit (HOSPITAL_COMMUNITY): Payer: BLUE CROSS/BLUE SHIELD

## 2018-08-03 ENCOUNTER — Encounter (HOSPITAL_COMMUNITY): Payer: BLUE CROSS/BLUE SHIELD

## 2018-08-05 ENCOUNTER — Encounter (HOSPITAL_COMMUNITY): Payer: BLUE CROSS/BLUE SHIELD

## 2018-08-05 ENCOUNTER — Ambulatory Visit (HOSPITAL_COMMUNITY): Payer: BLUE CROSS/BLUE SHIELD

## 2018-08-07 ENCOUNTER — Encounter (HOSPITAL_COMMUNITY): Payer: BLUE CROSS/BLUE SHIELD

## 2018-08-07 ENCOUNTER — Telehealth: Payer: Self-pay | Admitting: Internal Medicine

## 2018-08-07 ENCOUNTER — Ambulatory Visit (HOSPITAL_COMMUNITY): Payer: BLUE CROSS/BLUE SHIELD

## 2018-08-07 NOTE — Telephone Encounter (Addendum)
I called the patient regarding his my chart message: "Dr. [name],   I am having a lot of bruising and I'm wondering about the dosage of the blood thinner I am currently taking, or if there is another reason this is occurring. My back/spine has a bruise on it and it has been hurting me.   Thanks,  [employer] received at 1 pm 08/07/2018. I called the patient on 08/07/2018 at 2:01 pm on his home phone and 2:03 pm on cell phone. Was unable to reach patient and LMTCB and expressed my concerned for his symptoms. I replied to his MyChart message at 2:06 pm.   If patient returns call: If the patient has worrisome bruising with any signs of leg weakness, loss of feeling in his legs, or loss of bowel or bladder control, or loss of consciousness with head injury, he should call 911 and present to ED.  If bruising is less worrisome, can consider holding plavix for 2-3 weeks and monitoring bleeding. Remain on ASA 81 mg daily. If bruising worsens, or he develops a hematoma (hard collection of blood), he should call the office and be seen for an exam.   I will plan to speak to the patient personally as he is available.

## 2018-08-07 NOTE — Telephone Encounter (Signed)
Pt aware to hold Plavix for 2-3 weeks and continue with ASA if no improvement with bruising to call office Pt verbalizes understanding .Adonis Housekeeper

## 2018-08-10 ENCOUNTER — Encounter (HOSPITAL_COMMUNITY): Payer: BLUE CROSS/BLUE SHIELD

## 2018-08-10 ENCOUNTER — Ambulatory Visit (HOSPITAL_COMMUNITY): Payer: BLUE CROSS/BLUE SHIELD

## 2018-08-12 ENCOUNTER — Encounter (HOSPITAL_COMMUNITY): Payer: BLUE CROSS/BLUE SHIELD

## 2018-08-12 ENCOUNTER — Ambulatory Visit (HOSPITAL_COMMUNITY): Payer: BLUE CROSS/BLUE SHIELD

## 2018-08-14 ENCOUNTER — Encounter (HOSPITAL_COMMUNITY): Payer: BLUE CROSS/BLUE SHIELD

## 2018-08-14 ENCOUNTER — Ambulatory Visit (HOSPITAL_COMMUNITY): Payer: BLUE CROSS/BLUE SHIELD

## 2018-08-17 ENCOUNTER — Encounter (HOSPITAL_COMMUNITY): Payer: BLUE CROSS/BLUE SHIELD

## 2018-08-17 ENCOUNTER — Ambulatory Visit (HOSPITAL_COMMUNITY): Payer: BLUE CROSS/BLUE SHIELD

## 2018-08-19 ENCOUNTER — Encounter (HOSPITAL_COMMUNITY): Payer: BLUE CROSS/BLUE SHIELD

## 2018-08-19 ENCOUNTER — Ambulatory Visit (HOSPITAL_COMMUNITY): Payer: BLUE CROSS/BLUE SHIELD

## 2018-08-20 IMAGING — DX DG LUMBAR SPINE COMPLETE 4+V
5 series · 5 of 5 positions shown · non-contrast
Comparison: CT of the abdomen and pelvis performed 12/13/2011

CLINICAL DATA: Acute onset of left lower back pain, with bilateral
leg cramping. Initial encounter.

EXAM:
LUMBAR SPINE - COMPLETE 4+ VIEW

[l-spine ap]
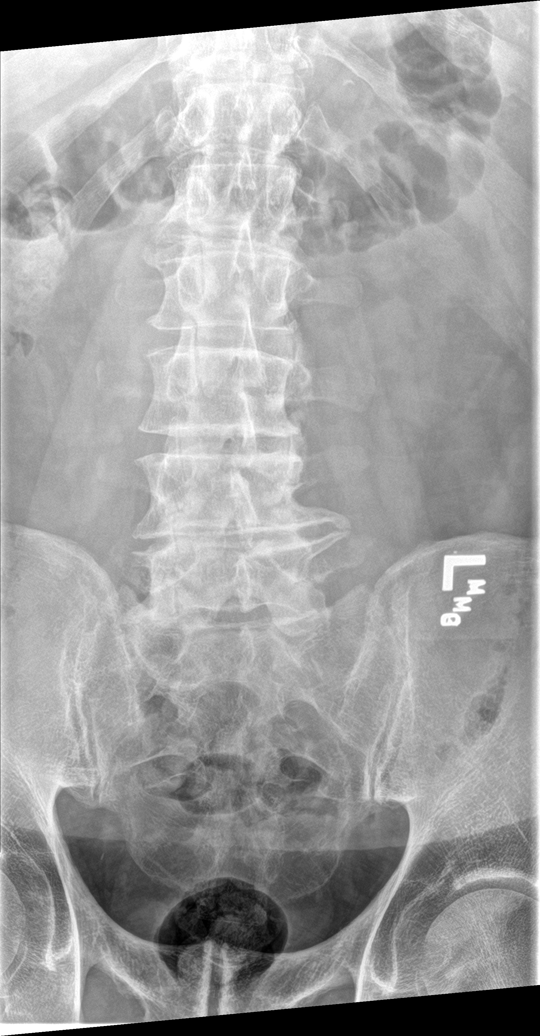

[l-spine obl (1 of 2)]
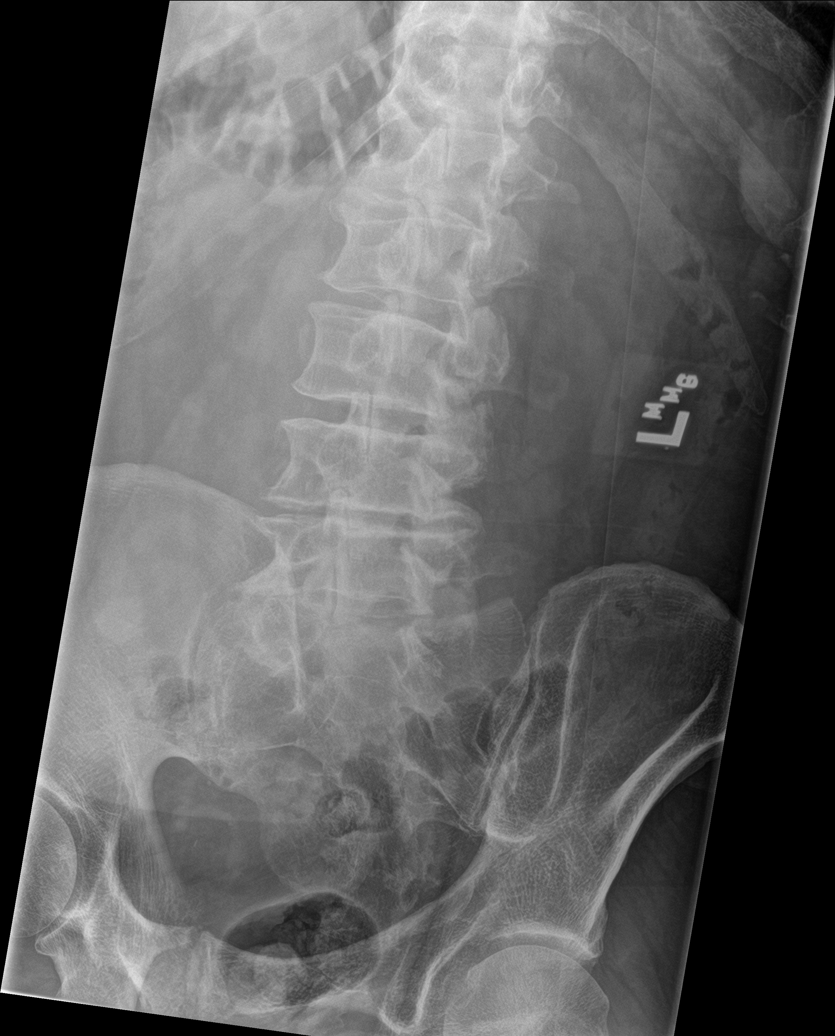

[l-spine obl (2 of 2)]
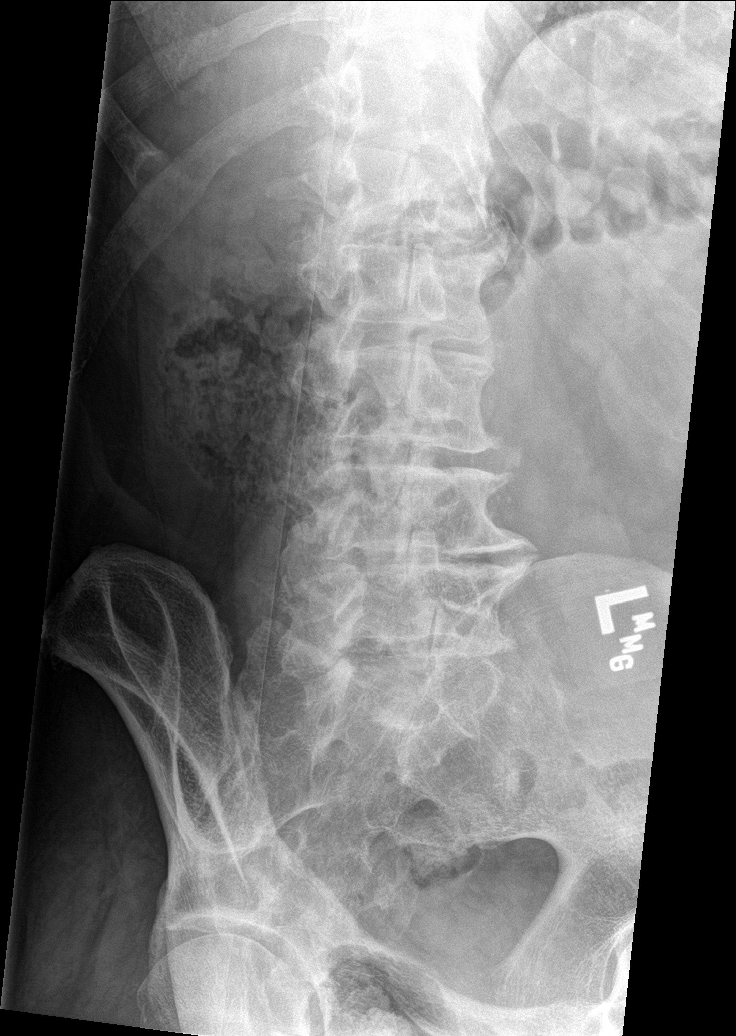

[l-spine lat]
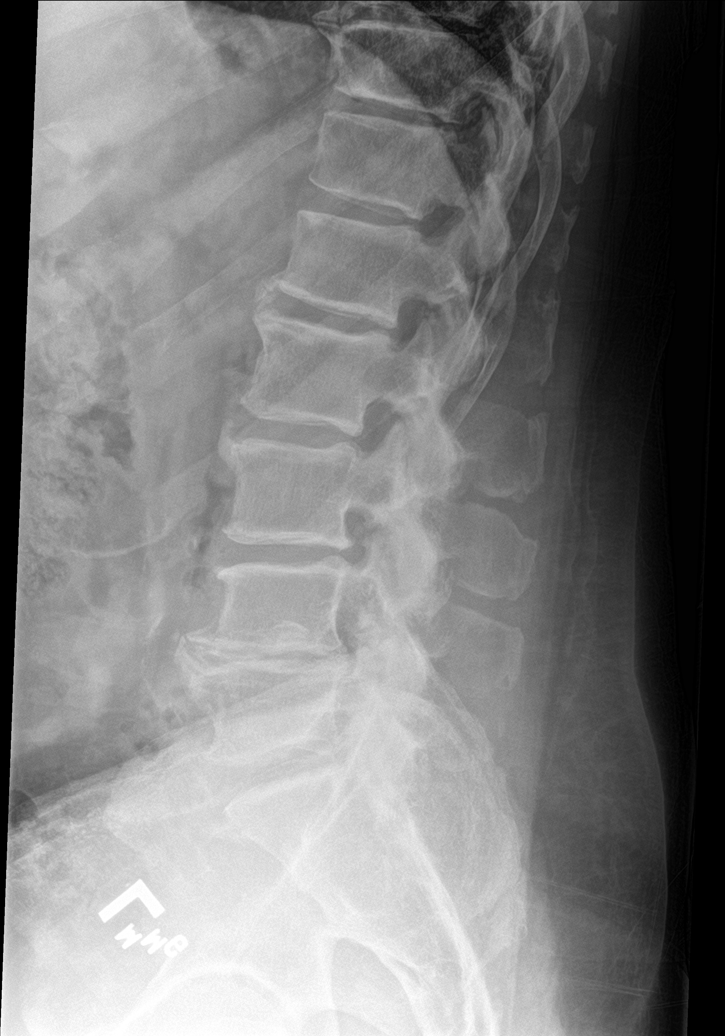

[l-spine spot]
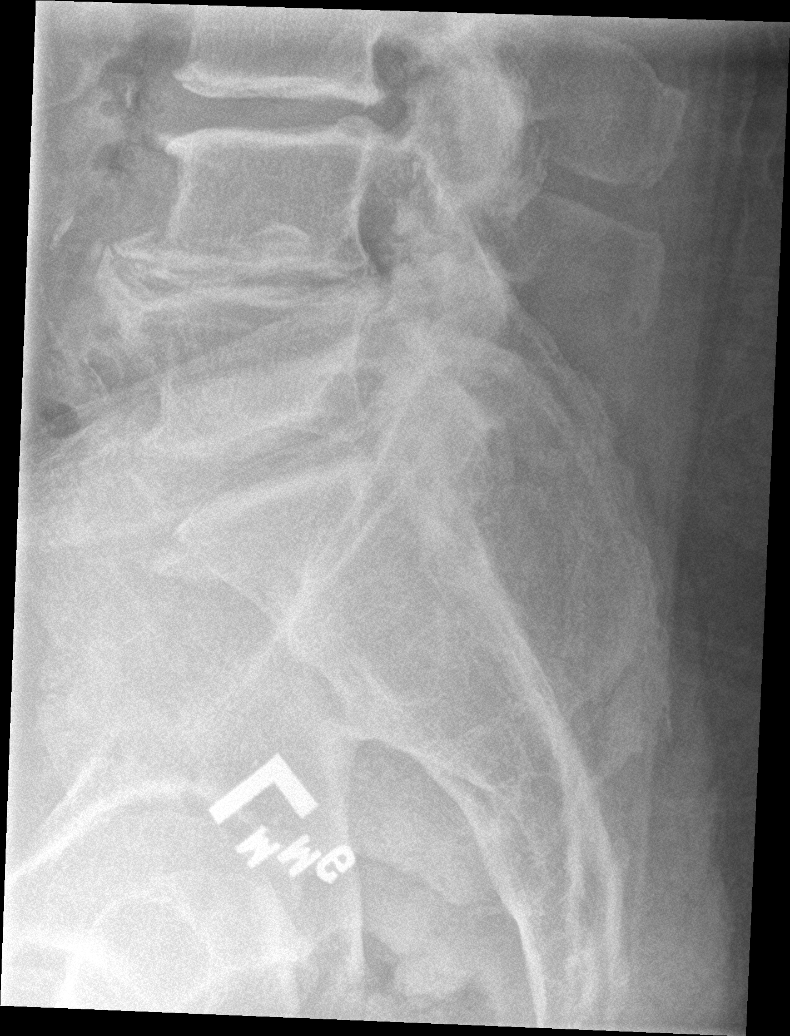

[5 of 5 positions shown; findings below may reference images not displayed]

FINDINGS: There is no evidence of fracture or subluxation. Vertebral bodies
demonstrate normal height and alignment. Lateral and anterior
osteophytes are noted along the lumbar spine. There is mild disc
space narrowing at L4-L5. Facet disease is noted along the lumbar
spine.

The visualized bowel gas pattern is unremarkable in appearance; air
and stool are noted within the colon. The sacroiliac joints are
within normal limits.
IMPRESSION: 1. No evidence of fracture or subluxation along the lumbar spine.
2. Mild degenerative change along the lumbar spine.

## 2018-08-21 ENCOUNTER — Encounter (HOSPITAL_COMMUNITY): Payer: BLUE CROSS/BLUE SHIELD

## 2018-08-21 ENCOUNTER — Ambulatory Visit (HOSPITAL_COMMUNITY): Payer: BLUE CROSS/BLUE SHIELD

## 2018-08-24 ENCOUNTER — Encounter (HOSPITAL_COMMUNITY): Payer: BLUE CROSS/BLUE SHIELD

## 2018-08-24 ENCOUNTER — Ambulatory Visit (HOSPITAL_COMMUNITY): Payer: BLUE CROSS/BLUE SHIELD

## 2018-08-26 ENCOUNTER — Encounter (HOSPITAL_COMMUNITY): Payer: BLUE CROSS/BLUE SHIELD

## 2018-08-26 ENCOUNTER — Ambulatory Visit (HOSPITAL_COMMUNITY): Payer: BLUE CROSS/BLUE SHIELD

## 2018-08-26 ENCOUNTER — Telehealth: Payer: Self-pay

## 2018-08-26 ENCOUNTER — Ambulatory Visit (INDEPENDENT_AMBULATORY_CARE_PROVIDER_SITE_OTHER): Payer: BLUE CROSS/BLUE SHIELD | Admitting: Internal Medicine

## 2018-08-26 ENCOUNTER — Encounter: Payer: Self-pay | Admitting: Internal Medicine

## 2018-08-26 VITALS — BP 121/73 | HR 64 | Temp 99.8°F | Ht 70.0 in | Wt 198.0 lb

## 2018-08-26 DIAGNOSIS — R197 Diarrhea, unspecified: Secondary | ICD-10-CM | POA: Diagnosis not present

## 2018-08-26 NOTE — Assessment & Plan Note (Signed)
Doubt related to the tuna sandwich but possible Unlikely COVID--but also possible. Discussed staying out of work till at least 1 week after symptoms started and 3 days after no symptoms Some concern that this is a reaction to repatha---was told to stop and restart in 4 weeks (will have to discontinue if gets sick with the rechallenge) Likely infectious---discussed time course, slow increase in eating. Discussed warning signs of worsening (pain , inability to eat, etc)

## 2018-08-26 NOTE — Telephone Encounter (Signed)
pts wife called in and Shirlean Mylar gave pt virtual appt today at 12 noon. FYI to Dr Silvio Pate.

## 2018-08-26 NOTE — Telephone Encounter (Signed)
[facility] Patient Name: [name] Gender: Male DOB: 1955/05/05 Age: 63 Y 52 M Return Phone Number: [phone] (Primary) Address: City/State/Zip: [place]  [address] Client [facility] Night - Client Client Site [place] Physician Viviana Simpler - MD Contact Type Call Who Is Calling Patient / Member / Family / Caregiver Call Type Triage / Clinical Caller Name [facility] Relationship To Patient Spouse Return Phone Number [phone] (Primary) Chief Complaint Fever (non urgent symptom) (> THREE MONTHS) Reason for Call Symptomatic / Request for Kayenta states husband is having back pain, diarrhea, chills, and a fever of 100.3. Wants to know what they should be doing. Translation No Nurse Assessment Nurse: Fabio Neighbors, RN, Kitty Date/Time (Eastern Time): 08/25/2018 7:49:30 PM Confirm and document reason for call. If symptomatic, describe symptoms. ---Caller states that her spouse has diarrhea, chills, and body aches. Current temperature 100.3 oral. He has been given tylenol every 6 hrs. Recent open heart surgery. Was taken off his blood thinners due to severe bruise on his back. He has been in bed all day. Has the patient had close contact with a person known or suspected to have the novel coronavirus illness OR traveled / lives in area with major community spread (including international travel) in the last 14 days from the onset of symptoms? * If Asymptomatic, screen for exposure and travel within the last 14 days. ---No Does the patient have any new or worsening symptoms? ---Yes Will a triage be completed? ---Yes Related visit to physician within the last 2 weeks? ---No Does the PT have any chronic conditions? (i.e. diabetes, asthma, this includes High risk factors for pregnancy,  etc.) ---Yes List chronic conditions. ---recent open heart surgery, DM, HLD Is this a behavioral health or substance abuse call? ---No Guidelines Guideline Title Affirmed Question Affirmed Notes Nurse Date/Time (Eastern Time) Diarrhea Weak immune system (e.g., HIV positive, cancer Fabio Neighbors, RN, Perrin Smack 08/25/2018 7:54:22 PM PLEASE NOTE: All timestamps contained within this report are represented as [place] Standard Time. CONFIDENTIALTY NOTICE: This fax transmission is intended only for the addressee. It contains information that is legally privileged, confidential or otherwise protected from use or disclosure. If you are not the intended recipient, you are strictly prohibited from reviewing, disclosing, copying using or disseminating any of this information or taking any action in reliance on or regarding this information. If you have received this fax in error, please notify us immediately by telephone so that we can arrange for its return to [place]. Phone: [phone], Toll-Free: [phone], Fax: [phone] Page: 2 of 2 Call Id: [id number] Guidelines Guideline Title Affirmed Question Affirmed Notes Nurse Date/Time Eilene Ghazi Time) chemo, splenectomy, organ transplant, chronic steroids) Disp. Time Eilene Ghazi Time) Disposition Final User 08/25/2018 8:00:44 PM See PCP within 24 Hours Yes Honeycutt, RN, Perrin Smack Caller Disagree/Comply Comply Caller Understands Yes PreDisposition Search internet for information Care Advice Given Per Guideline SEE PCP WITHIN 24 HOURS: FLUID THERAPY DURING MILD-MODERATE DIARRHEA: * WATER: For mild to moderate diarrhea, water is often the best liquid to drink. You should also eat some salty foods (e.g., potato chips, pretzels, saltine crackers). This is important to make sure you are getting enough salt, sugars, and fluids to meet your body's needs. * Drink more fluids, at least 8-10 cups daily. One cup equals 8 oz (240 ml). * Begin with boiled starches / cereals  (e.g., potatoes, rice, noodles, wheat, oats)  with a small amount of salt to taste. CALL BACK IF: * Signs of dehydration occur (e.g., no urine over 12 hours, very dry mouth, lightheaded, etc.) * Bloody stools * Constant or severe abdominal pain * You become worse. Comments User: Romie Jumper, RN Date/Time Eilene Ghazi Time): 08/25/2018 7:55:50 PM He is having no chest pain User: Romie Jumper, RN Date/Time Eilene Ghazi Time): 08/25/2018 8:10:46 PM Asking about repatha side effects. Information obtained from Web MD Referrals REFERRED TO PCP OFFICE

## 2018-08-26 NOTE — Progress Notes (Signed)
Subjective:    Patient ID: [name], male    DOB: 17-Nov-1955, 63 y.o.   MRN: [id number]  HPI Virtual visit due to diarrhea Identification done Reviewed billing and he gave consent He is at home--and I am in my office His wife is there also  Started 2 days ago Thought he was passing gas---but he was having watery diarrhea Has had at least 10 spells in the past 2 days Temp up to 101.9--down some with tylenol No blood No nausea or vomiting Eating soup, eggs, grits---does have to go after eating Some abdominal pain Has no energy Able to drink  Did get sandwich at [facility] the day before this started (tuna)  He did have his 3rd dose of repatha recently Some concern that this could be side effect Holding off on it for 4 weeks (generally every 14 day)  Current Outpatient Medications on File Prior to Visit  Medication Sig Dispense Refill  . aspirin EC 81 MG tablet Take 81 mg by mouth daily.    . metoprolol tartrate (LOPRESSOR) 25 MG tablet Take 0.5 tablets (12.5 mg total) by mouth 2 (two) times daily. 90 tablet 3  . Multiple Vitamin (MULTIVITAMIN) tablet Take 1 tablet by mouth daily.    . pantoprazole (PROTONIX) 40 MG tablet TAKE 1 TABLET BY MOUTH EVERY DAY 30 tablet 6  . Evolocumab (REPATHA SURECLICK) 409 MG/ML SOAJ Inject 140 mg into the skin every 14 (fourteen) days. (Patient not taking: Reported on 06/29/2018) 2 pen 6  . nitroGLYCERIN (NITROSTAT) 0.4 MG SL tablet Place 1 tablet (0.4 mg total) under the tongue every 5 (five) minutes as needed for chest pain. 25 tablet 3   No current facility-administered medications on file prior to visit.     Allergies  Allergen Reactions  . Atorvastatin     REACTION: muscle aches  . Simvastatin     REACTION: hand pain  . Pravachol [Pravastatin Sodium]     Muscle aching    Past Medical History:  Diagnosis Date  . CAD (coronary artery disease) 7/07   Cath for unstable angina--- 50-70% LAD, 40% circ  . ED (erectile dysfunction)   .  GERD (gastroesophageal reflux disease)   . Hx of colonic polyps   . Hyperlipidemia   . Hypertension   . Impaired fasting glucose   . 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 Sleep disorder   . STEMI involving left anterior descending coronary artery ([place]) 04/12/2018  . Wrist fracture     6 foot fall onto head, concussion, L wrist (comminuted with intra-articular extension) and 2nd finger (middle phalanx) fxs, vertigo    Past Surgical History:  Procedure Laterality Date  . BRANCHIAL CLEFT CYST EXCISION    . CARDIAC CATHETERIZATION    . COLONOSCOPY  04/03/2007  . CORONARY ARTERY BYPASS GRAFT N/A 04/12/2018   Procedure: CORONARY ARTERY BYPASS GRAFTING (CABG) x two , using left internal mammary artery and right leg greater saphenous vein harvested endoscopically;  Surgeon: Rexene Alberts, MD;  Location: [facility];  Service: Open Heart Surgery;  Laterality: N/A;  . CORONARY/GRAFT ACUTE MI REVASCULARIZATION N/A 04/12/2018   Procedure: Coronary/Graft Acute MI Revascularization;  Surgeon: Sherren Mocha, MD;  Location: [facility];  Service: Cardiovascular;  Laterality: N/A;  . HEMORRHOID SURGERY    . LEFT HEART CATH AND CORONARY ANGIOGRAPHY N/A 04/12/2018   Procedure: LEFT HEART CATH AND CORONARY ANGIOGRAPHY;  Surgeon: Burt Knack,  Legrand Como, MD;  Location: [facility];  Service: Cardiovascular;  Laterality: N/A;  . RHINOPLASTY    . TEE WITHOUT CARDIOVERSION N/A 04/12/2018   Procedure: TRANSESOPHAGEAL ECHOCARDIOGRAM (TEE);  Surgeon: Rexene Alberts, MD;  Location: [facility];  Service: Open Heart Surgery;  Laterality: N/A;    Family History  Problem Relation Age of Onset  . Coronary artery disease Mother   . Stroke Mother   . Coronary artery disease Brother   . Cancer Brother        colon or rectal  . Colon cancer Brother 2       half-brother   . Diabetes Neg Hx   . Stomach cancer Neg Hx     Social History   Socioeconomic History  .  Marital status: Married    Spouse name: Designer, fashion/clothing  . Number of children: 4  . Years of education: Not on file  . Highest education level: High school graduate  Occupational History  . Occupation: installs signs    Employer: [employer]  . Financial resource strain: Not hard at all  . Food insecurity:    Worry: Never true    Inability: Never true  . Transportation needs:    Medical: No    Non-medical: No  Tobacco Use  . Smoking status: Former Smoker    Years: 35.00    Types: Cigarettes    Last attempt to quit: 10/27/2005    Years since quitting: 12.8  . Smokeless tobacco: Never Used  Substance and Sexual Activity  . Alcohol use: Yes    Comment: once a month-beer   . Drug use: No  . Sexual activity: Not on file  Lifestyle  . Physical activity:    Days per week: 5 days    Minutes per session: 30 min  . Stress: Not at all  Relationships  . Social connections:    Talks on phone: Not on file    Gets together: Not on file    Attends religious service: Not on file    Active member of club or organization: Not on file    Attends meetings of clubs or organizations: Not on file    Relationship status: Not on file  . Intimate partner violence:    Fear of current or ex partner: Not on file    Emotionally abused: Not on file    Physically abused: Not on file    Forced sexual activity: Not on file  Other Topics Concern  . Not on file  Social History Narrative  . Not on file   Review of Systems No other ill exposures No cough or breathing problems Still working---fairly isolated for his job No blood or pain in his urine    Objective:   Physical Exam  Constitutional: He appears well-developed. No distress.  GI:  Mild periumbilical tenderness without rebound (with him doing the pushing for me)  Psychiatric: He has a normal mood and affect. His behavior is normal.           Assessment & Plan:

## 2018-08-28 ENCOUNTER — Encounter (HOSPITAL_COMMUNITY): Payer: BLUE CROSS/BLUE SHIELD

## 2018-08-28 ENCOUNTER — Encounter: Payer: Self-pay | Admitting: Internal Medicine

## 2018-08-28 ENCOUNTER — Ambulatory Visit (HOSPITAL_COMMUNITY): Payer: BLUE CROSS/BLUE SHIELD

## 2018-08-28 NOTE — Telephone Encounter (Signed)
Please find out if we can fax this or if he needs to pick it up

## 2018-08-31 ENCOUNTER — Encounter (HOSPITAL_COMMUNITY): Payer: BLUE CROSS/BLUE SHIELD

## 2018-08-31 ENCOUNTER — Ambulatory Visit (HOSPITAL_COMMUNITY): Payer: BLUE CROSS/BLUE SHIELD

## 2018-09-02 ENCOUNTER — Ambulatory Visit (HOSPITAL_COMMUNITY): Payer: BLUE CROSS/BLUE SHIELD

## 2018-09-02 ENCOUNTER — Encounter (HOSPITAL_COMMUNITY): Payer: BLUE CROSS/BLUE SHIELD

## 2018-09-04 ENCOUNTER — Encounter (HOSPITAL_COMMUNITY): Payer: BLUE CROSS/BLUE SHIELD

## 2018-09-04 ENCOUNTER — Ambulatory Visit (HOSPITAL_COMMUNITY): Payer: BLUE CROSS/BLUE SHIELD

## 2018-09-07 ENCOUNTER — Encounter (HOSPITAL_COMMUNITY): Payer: BLUE CROSS/BLUE SHIELD

## 2018-09-07 ENCOUNTER — Ambulatory Visit (HOSPITAL_COMMUNITY): Payer: BLUE CROSS/BLUE SHIELD

## 2018-09-09 ENCOUNTER — Ambulatory Visit (HOSPITAL_COMMUNITY): Payer: BLUE CROSS/BLUE SHIELD

## 2018-09-09 ENCOUNTER — Encounter (HOSPITAL_COMMUNITY): Payer: BLUE CROSS/BLUE SHIELD

## 2018-09-11 ENCOUNTER — Encounter (HOSPITAL_COMMUNITY): Payer: BLUE CROSS/BLUE SHIELD

## 2018-09-11 ENCOUNTER — Ambulatory Visit (HOSPITAL_COMMUNITY): Payer: BLUE CROSS/BLUE SHIELD

## 2018-09-14 ENCOUNTER — Encounter (HOSPITAL_COMMUNITY): Payer: BLUE CROSS/BLUE SHIELD

## 2018-09-14 ENCOUNTER — Ambulatory Visit (HOSPITAL_COMMUNITY): Payer: BLUE CROSS/BLUE SHIELD

## 2018-09-16 ENCOUNTER — Ambulatory Visit (HOSPITAL_COMMUNITY): Payer: BLUE CROSS/BLUE SHIELD

## 2018-09-16 ENCOUNTER — Encounter (HOSPITAL_COMMUNITY): Payer: BLUE CROSS/BLUE SHIELD

## 2018-09-18 ENCOUNTER — Ambulatory Visit (HOSPITAL_COMMUNITY): Payer: BLUE CROSS/BLUE SHIELD

## 2018-09-18 ENCOUNTER — Encounter (HOSPITAL_COMMUNITY): Payer: BLUE CROSS/BLUE SHIELD

## 2018-09-29 ENCOUNTER — Telehealth: Payer: Self-pay | Admitting: *Deleted

## 2018-09-29 NOTE — Telephone Encounter (Signed)
spoke to patient to discuss virtuall visit - patient decline visit at this time . Patient preferred an in office vist with dr Arlana Hove- appt reschedule for 12/09/18 at 8 am . Patient aware.

## 2018-10-06 ENCOUNTER — Ambulatory Visit: Payer: BLUE CROSS/BLUE SHIELD | Admitting: Internal Medicine

## 2018-11-25 ENCOUNTER — Telehealth: Payer: Self-pay | Admitting: *Deleted

## 2018-11-25 NOTE — Telephone Encounter (Signed)
Spoke with patient and he said that he did not want to do a virtual visit or wait until December to see Dr. [name] in person son he wants to see another provider. I told him that I was going to cancel his appointment and that someone would call him to get him rescheduled with another provider.

## 2018-11-29 NOTE — Progress Notes (Signed)
Cardiology Office Note   Date:  11/30/2018   ID:  [name], DOB July 11, 1955, MRN [id number]  PCP:  Venia Carbon, MD  Cardiologist:  Dr.Acharya  Follow Up   History of Present Illness: [name] is a 63 y.o. male who presents for ongoing assessment and management of coronary artery disease with history of STEMI and emergent cardiac catheterization leading to coronary artery bypass grafting due to severe ostial LAD disease with unfavorable anatomy for PCI.  He was last seen by Dr. [name] on 05/27/2018 with complaining of bilateral hand pain and stiffness.  The patient felt that this was related to increased dose of statin therapy and had not tolerated statins in the past due to similar symptoms.  He also complained of some left upper leg pain at the site of the saphenous vein graft.  There is no evidence of infection discoloration distal cyanosis or hematoma.  As a result of his statin intolerance he was referred to the lipid clinic with a goal of LDL less than 70.  He has been started on Repatha.  He was continued on diclofenac gel for his pain in his hands.  He called our office in April due to intensive bruising I was concerned about his medication regimen using aspirin.  Dr.Acharya  I did speak with him by phone concerning this and did not believe it was related to the aspirin.  He was continued on metoprolol, aspirin, pantoprazole and nitroglycerin as needed.  His son requested that he have an appointment today for ongoing evaluation in his multiple symptoms.  He states that he has stopped the Repatha injections as this was causing severe bruising and severe back pain.  When he stopped the medication the bruising and the back pain resolved.  He has not followed up in the lipid clinic since that time.  He is also complaining of discomfort at the sternotomy site where the suture wires are underneath the skin.  He states that they are poking into his skin and causing him some pain.  Past  Medical History:  Diagnosis Date  . CAD (coronary artery disease) 7/07   Cath for unstable angina--- 50-70% LAD, 40% circ  . ED (erectile dysfunction)   . GERD (gastroesophageal reflux disease)   . Hx of colonic polyps   . Hyperlipidemia   . Hypertension   . Impaired fasting glucose   . 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 Sleep disorder   . STEMI involving left anterior descending coronary artery ([place]) 04/12/2018  . Wrist fracture     6 foot fall onto head, concussion, L wrist (comminuted with intra-articular extension) and 2nd finger (middle phalanx) fxs, vertigo    Past Surgical History:  Procedure Laterality Date  . BRANCHIAL CLEFT CYST EXCISION    . CARDIAC CATHETERIZATION    . COLONOSCOPY  04/03/2007  . CORONARY ARTERY BYPASS GRAFT N/A 04/12/2018   Procedure: CORONARY ARTERY BYPASS GRAFTING (CABG) x two , using left internal mammary artery and right leg greater saphenous vein harvested endoscopically;  Surgeon: Rexene Alberts, MD;  Location: [facility];  Service: Open Heart Surgery;  Laterality: N/A;  . CORONARY/GRAFT ACUTE MI REVASCULARIZATION N/A 04/12/2018   Procedure: Coronary/Graft Acute MI Revascularization;  Surgeon: Sherren Mocha, MD;  Location: [facility];  Service: Cardiovascular;  Laterality: N/A;  . HEMORRHOID SURGERY    . LEFT HEART CATH AND CORONARY ANGIOGRAPHY N/A 04/12/2018  Procedure: LEFT HEART CATH AND CORONARY ANGIOGRAPHY;  Surgeon: Sherren Mocha, MD;  Location: [facility];  Service: Cardiovascular;  Laterality: N/A;  . RHINOPLASTY    . TEE WITHOUT CARDIOVERSION N/A 04/12/2018   Procedure: TRANSESOPHAGEAL ECHOCARDIOGRAM (TEE);  Surgeon: Rexene Alberts, MD;  Location: [facility];  Service: Open Heart Surgery;  Laterality: N/A;     Current Outpatient Medications  Medication Sig Dispense Refill  . aspirin EC 81 MG tablet Take 81 mg by mouth daily.    . Evolocumab (REPATHA SURECLICK) 784 MG/ML  SOAJ Inject 140 mg into the skin every 14 (fourteen) days. 2 pen 6  . metoprolol tartrate (LOPRESSOR) 25 MG tablet Take 0.5 tablets (12.5 mg total) by mouth 2 (two) times daily. 90 tablet 3  . Multiple Vitamin (MULTIVITAMIN) tablet Take 1 tablet by mouth daily.    . nitroGLYCERIN (NITROSTAT) 0.4 MG SL tablet Place 1 tablet (0.4 mg total) under the tongue every 5 (five) minutes as needed for chest pain. 25 tablet 3  . pantoprazole (PROTONIX) 40 MG tablet TAKE 1 TABLET BY MOUTH EVERY DAY 30 tablet 6   No current facility-administered medications for this visit.     Allergies:   Atorvastatin, Simvastatin, and Pravachol [pravastatin sodium]    Social History:  The patient  reports that he quit smoking about 13 years ago. His smoking use included cigarettes. He quit after 35.00 years of use. He has never used smokeless tobacco. He reports current alcohol use. He reports that he does not use drugs.   Family History:  The patient's family history includes Cancer in his brother; Colon cancer (age of onset: 30) in his brother; Coronary artery disease in his brother and mother; Stroke in his mother.    ROS: All other systems are reviewed and negative. Unless otherwise mentioned in H&P    PHYSICAL EXAM: VS:  BP 117/70   Pulse 60   Temp 97.7 F (36.5 C)   Ht 5\' 11"  (1.803 m)   Wt 191 lb (86.6 kg)   BMI 26.64 kg/m  , BMI Body mass index is 26.64 kg/m. GEN: Well nourished, well developed, in no acute distress HEENT: normal Neck: no JVD, carotid bruits, or masses Cardiac: RRR; no murmurs, rubs, or gallops,no edema  Respiratory:  Clear to auscultation bilaterally, normal work of breathing GI: soft, nontender, nondistended, + BS MS: no deformity or atrophy, sternotomy site is well-healed but sternotomy wires are prominent at the knot.  He states that if he presses on them he feels tingling and some discomfort.  There is no erosion of the skin, evidence of erythema, or bleeding. Skin: warm and  dry, no rash Neuro:  Strength and sensation are intact Psych: euthymic mood, full affect   EKG: Not completed this office visit  Recent Labs: 04/12/2018: ALT 32 04/13/2018: Magnesium 2.4 04/15/2018: BUN 9; Creatinine, Ser 1.12; Hemoglobin 11.6; Platelets 222; Potassium 3.5; Sodium 137    Lipid Panel    Component Value Date/Time   CHOL 137 04/13/2018 0415   TRIG 83 04/13/2018 0415   HDL 31 (L) 04/13/2018 0415   CHOLHDL 4.4 04/13/2018 0415   VLDL 17 04/13/2018 0415   LDLCALC 89 04/13/2018 0415   LDLDIRECT 171.0 12/25/2012 0950      Wt Readings from Last 3 Encounters:  11/30/18 191 lb (86.6 kg)  08/26/18 198 lb (89.8 kg)  07/01/18 214 lb 8.1 oz (97.3 kg)      Other studies Reviewed: Echocardiogram: 04/12/2018 Left ventricle: The cavity size  was normal. Wall thickness was   normal. Systolic function was normal. The estimated ejection   fraction was in the range of 55% to 60%. Diffuse hypokinesis.   Doppler parameters are consistent with abnormal left ventricular   relaxation (grade 1 diastolic dysfunction).   Dist LM to Ost LAD lesion is 90% stenosed.  Prox LAD to Mid LAD lesion is 95% stenosed.  There is mild left ventricular systolic dysfunction.  LV end diastolic pressure is normal.  The left ventricular ejection fraction is 50-55% by visual estimate.   Cardiac Cath 04/12/2018  1.  Severe single-vessel coronary artery disease involving the ostium and the midportion of the LAD which correlates with the patient's acute injury current on EKG 2.  Patent left mainstem, left circumflex, and RCA with mild nonobstructive stenosis 3.  Mild contraction abnormality of the LV apex with preserved overall LVEF estimated at 50 to 55%   -------------------------------------------------------------------    ASSESSMENT AND PLAN:  1.  Coronary artery disease: History of coronary artery bypass grafting, 03/2018 (LIMA to LAD, right greater saphenous vein harvest site.  He  has some prominent sternotomy wires which are poking under his skin causing him to have some tingling and some pain.  There is no evidence of erosion, infection, or bleeding at the sites.  I have advised him to watch the size for those issues.  Try not to rub his skin over the sternotomy sites to prevent irritation.  He will continue beta-blocker and aspirin.  2.  Hypercholesterolemia: Intolerant to several statins and has been followed by the lipid clinic.  Was on Repatha but was unable to tolerate that as well due to severe back pain and muscle aches and pains in his arms and hands along with excessive bruising around injection site.  I am going to refer him to the lipid clinic for other options which can be recommended and discussed with the patient.  He will have fasting lipids and LFTs completed for status.  3.  GERD: He continues on pantoprazole 40 mg daily we will follow-up with his primary for any further recommendations, medication changes,or testing.  Current medicines are reviewed at length with the patient today.    Labs/ tests ordered today include: Fasting Lipids and LFT's and BMET  Phill Myron. West Pugh, ANP, AACC   11/30/2018 [address] Office [phone] Fax [phone]

## 2018-11-30 ENCOUNTER — Ambulatory Visit (INDEPENDENT_AMBULATORY_CARE_PROVIDER_SITE_OTHER): Payer: BC Managed Care – PPO | Admitting: Adult Health

## 2018-11-30 ENCOUNTER — Other Ambulatory Visit: Payer: Self-pay

## 2018-11-30 ENCOUNTER — Encounter: Payer: Self-pay | Admitting: Adult Health

## 2018-11-30 VITALS — BP 117/70 | HR 60 | Temp 97.7°F | Ht 71.0 in | Wt 191.0 lb

## 2018-11-30 DIAGNOSIS — Z79899 Other long term (current) drug therapy: Secondary | ICD-10-CM | POA: Diagnosis not present

## 2018-11-30 DIAGNOSIS — I2102 ST elevation (STEMI) myocardial infarction involving left anterior descending coronary artery: Secondary | ICD-10-CM

## 2018-11-30 DIAGNOSIS — E785 Hyperlipidemia, unspecified: Secondary | ICD-10-CM | POA: Diagnosis not present

## 2018-11-30 DIAGNOSIS — I2510 Atherosclerotic heart disease of native coronary artery without angina pectoris: Secondary | ICD-10-CM | POA: Diagnosis not present

## 2018-11-30 DIAGNOSIS — Z951 Presence of aortocoronary bypass graft: Secondary | ICD-10-CM

## 2018-11-30 NOTE — Patient Instructions (Signed)
Medication Instructions:  Continue current medications  If you need a refill on your cardiac medications before your next appointment, please call your pharmacy.  Labwork: Fasting Lipid Liver, BMP and CBC HERE IN OUR OFFICE AT [facility]  You will need to fast. DO NOT EAT OR DRINK PAST MIDNIGHT.     You will NOT need to fast   Take the provided lab slips with you to the lab for your blood draw.   When you have your labs (blood work) drawn today and your tests are completely normal, you will receive your results only by MyChart Message (if you have MyChart) -OR-  A paper copy in the mail.  If you have any lab test that is abnormal or we need to change your treatment, we will call you to review these results.  Testing/Procedures: None Ordered  Follow-Up: Your physician recommends that you schedule a follow-up appointment in: next available in [facility] with Raquel or Dan Maker will need a follow up appointment in 6 months.  Please call our office 2 months in advance to schedule this appointment.  You may see Elouise Munroe, MD or one of the following Advanced Practice Providers on your designated Care Team:   Rosaria Ferries, PA-C . Jory Sims, DNP, ANP     At [facility], you and your health needs are our priority.  As part of our continuing mission to provide you with exceptional heart care, we have created designated Provider Care Teams.  These Care Teams include your primary Cardiologist (physician) and Advanced Practice Providers (APPs -  Physician Assistants and Nurse Practitioners) who all work together to provide you with the care you need, when you need it.  Thank you for choosing [facility] at [facility]!!

## 2018-12-09 ENCOUNTER — Ambulatory Visit: Payer: BC Managed Care – PPO | Admitting: Internal Medicine

## 2018-12-19 ENCOUNTER — Other Ambulatory Visit: Payer: Self-pay | Admitting: Physician Assistant

## 2018-12-21 ENCOUNTER — Other Ambulatory Visit: Payer: Self-pay | Admitting: Internal Medicine

## 2018-12-23 ENCOUNTER — Other Ambulatory Visit: Payer: Self-pay

## 2018-12-23 ENCOUNTER — Encounter: Payer: Self-pay | Admitting: Internal Medicine

## 2018-12-23 ENCOUNTER — Ambulatory Visit: Payer: BC Managed Care – PPO | Admitting: Internal Medicine

## 2018-12-23 VITALS — BP 102/64 | HR 65 | Temp 98.2°F | Ht 71.0 in | Wt 190.0 lb

## 2018-12-23 DIAGNOSIS — B353 Tinea pedis: Secondary | ICD-10-CM | POA: Diagnosis not present

## 2018-12-23 DIAGNOSIS — I2510 Atherosclerotic heart disease of native coronary artery without angina pectoris: Secondary | ICD-10-CM

## 2018-12-23 LAB — COMPREHENSIVE METABOLIC PANEL
ALT: 15 U/L (ref 0–53)
AST: 18 U/L (ref 0–37)
Albumin: 4.3 g/dL (ref 3.5–5.2)
Alkaline Phosphatase: 89 U/L (ref 39–117)
BUN: 20 mg/dL (ref 6–23)
CO2: 30 mEq/L (ref 19–32)
Calcium: 9.4 mg/dL (ref 8.4–10.5)
Chloride: 104 mEq/L (ref 96–112)
Creatinine, Ser: 0.94 mg/dL (ref 0.40–1.50)
GFR: 80.86 mL/min (ref >60.00)
Glucose, Bld: 106 mg/dL — ABNORMAL HIGH (ref 70–99)
Potassium: 4.4 mEq/L (ref 3.5–5.1)
Sodium: 140 mEq/L (ref 135–145)
Total Bilirubin: 0.9 mg/dL (ref 0.2–1.2)
Total Protein: 6.8 g/dL (ref 6.0–8.3)

## 2018-12-23 LAB — LIPID PANEL
Cholesterol: 176 mg/dL (ref 0–200)
HDL: 51 mg/dL (ref >39.00)
LDL Cholesterol: 110 mg/dL — ABNORMAL HIGH (ref 0–99)
NonHDL: 124.67
Total CHOL/HDL Ratio: 3
Triglycerides: 73 mg/dL (ref 0.0–149.0)
VLDL: 14.6 mg/dL (ref 0.0–40.0)

## 2018-12-23 LAB — CBC
HCT: 45.6 % (ref 39.0–52.0)
Hemoglobin: 15.3 g/dL (ref 13.0–17.0)
MCHC: 33.5 g/dL (ref 30.0–36.0)
MCV: 91.1 fl (ref 78.0–100.0)
Platelets: 217 10*3/uL (ref 150.0–400.0)
RBC: 5 Mil/uL (ref 4.22–5.81)
RDW: 13.6 % (ref 11.5–15.5)
WBC: 6.9 10*3/uL (ref 4.0–10.5)

## 2018-12-23 LAB — HEMOGLOBIN A1C: Hgb A1c MFr Bld: 5.7 % (ref 4.6–6.5)

## 2018-12-23 MED ORDER — KETOCONAZOLE 2 % EX CREA: 1.0000 "application " | TOPICAL_CREAM | Freq: Two times a day (BID) | CUTANEOUS | 3 refills | Status: DC | PRN

## 2018-12-23 NOTE — Assessment & Plan Note (Signed)
Will try ketoconazole cream If not effective, will try oral fluconazole

## 2018-12-23 NOTE — Progress Notes (Signed)
Subjective:    Patient ID: [name], male    DOB: [birth year], 63 y.o.   MRN: [id number]  HPI Here due to concerns about athlete's foot  Has a rash--and now blisters--between right 4th and 5th toes Slightly between other toes Does sweat in shoes---is walking 5-6 miles on weekends and 2-3 miles during week when he can Itching and some burning Tried neosporin and sprayed it with antibacterial  Current Outpatient Medications on File Prior to Visit  Medication Sig Dispense Refill  . aspirin EC 81 MG tablet Take 81 mg by mouth daily.    . metoprolol tartrate (LOPRESSOR) 25 MG tablet Take 0.5 tablets (12.5 mg total) by mouth 2 (two) times daily. 90 tablet 3  . Multiple Vitamin (MULTIVITAMIN) tablet Take 1 tablet by mouth daily.    . pantoprazole (PROTONIX) 40 MG tablet TAKE 1 TABLET BY MOUTH EVERY DAY 90 tablet 3  . nitroGLYCERIN (NITROSTAT) 0.4 MG SL tablet Place 1 tablet (0.4 mg total) under the tongue every 5 (five) minutes as needed for chest pain. 25 tablet 3   No current facility-administered medications on file prior to visit.     Allergies  Allergen Reactions  . Atorvastatin     REACTION: muscle aches  . Simvastatin     REACTION: hand pain  . Pravachol [Pravastatin Sodium]     Muscle aching    Past Medical History:  Diagnosis Date  . CAD (coronary artery disease) 7/07   Cath for unstable angina--- 50-70% LAD, 40% circ  . ED (erectile dysfunction)   . GERD (gastroesophageal reflux disease)   . Hx of colonic polyps   . Hyperlipidemia   . Hypertension   . Impaired fasting glucose   . 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 Sleep disorder   . STEMI involving left anterior descending coronary artery ([place]) 04/12/2018  . Wrist fracture     6 foot fall onto head, concussion, L wrist (comminuted with intra-articular extension) and 2nd finger (middle phalanx) fxs, vertigo    Past Surgical History:  Procedure Laterality Date   . BRANCHIAL CLEFT CYST EXCISION    . CARDIAC CATHETERIZATION    . COLONOSCOPY  04/03/2007  . CORONARY ARTERY BYPASS GRAFT N/A 04/12/2018   Procedure: CORONARY ARTERY BYPASS GRAFTING (CABG) x two , using left internal mammary artery and right leg greater saphenous vein harvested endoscopically;  Surgeon: Rexene Alberts, MD;  Location: [place];  Service: Open Heart Surgery;  Laterality: N/A;  . CORONARY/GRAFT ACUTE MI REVASCULARIZATION N/A 04/12/2018   Procedure: Coronary/Graft Acute MI Revascularization;  Surgeon: Sherren Mocha, MD;  Location: [place] [facility];  Service: Cardiovascular;  Laterality: N/A;  . HEMORRHOID SURGERY    . LEFT HEART CATH AND CORONARY ANGIOGRAPHY N/A 04/12/2018   Procedure: LEFT HEART CATH AND CORONARY ANGIOGRAPHY;  Surgeon: Sherren Mocha, MD;  Location: [place] [facility];  Service: Cardiovascular;  Laterality: N/A;  . RHINOPLASTY    . TEE WITHOUT CARDIOVERSION N/A 04/12/2018   Procedure: TRANSESOPHAGEAL ECHOCARDIOGRAM (TEE);  Surgeon: Rexene Alberts, MD;  Location: [place];  Service: Open Heart Surgery;  Laterality: N/A;    Family History  Problem Relation Age of Onset  . Coronary artery disease Mother   . Stroke Mother   . Coronary artery disease Brother   . Cancer Brother        colon or rectal  . Colon cancer Brother 25  half-brother   . Diabetes Neg Hx   . Stomach cancer Neg Hx     Social History   Socioeconomic History  . Marital status: Married    Spouse name: Designer, fashion/clothing  . Number of children: 4  . Years of education: Not on file  . Highest education level: High school graduate  Occupational History  . Occupation: installs signs    Employer: [employer]  . Financial resource strain: Not hard at all  . Food insecurity    Worry: Never true    Inability: Never true  . Transportation needs    Medical: No    Non-medical: No  Tobacco Use  . Smoking status: Former Smoker    Years: 35.00    Types: Cigarettes     Quit date: 10/27/2005    Years since quitting: 13.1  . Smokeless tobacco: Never Used  Substance and Sexual Activity  . Alcohol use: Yes    Comment: once a month-beer   . Drug use: No  . Sexual activity: Not on file  Lifestyle  . Physical activity    Days per week: 5 days    Minutes per session: 30 min  . Stress: Not at all  Relationships  . Social Herbalist on phone: Not on file    Gets together: Not on file    Attends religious service: Not on file    Active member of club or organization: Not on file    Attends meetings of clubs or organizations: Not on file    Relationship status: Not on file  . Intimate partner violence    Fear of current or ex partner: Not on file    Emotionally abused: Not on file    Physically abused: Not on file    Forced sexual activity: Not on file  Other Topics Concern  . Not on file  Social History Narrative  . Not on file   Review of Systems Not sick No fever Fairly new shoes--wears socks    Objective:   Physical Exam  Skin:  Classic scaling between right 3rd and 4th and mostly between 4th and 5th toes No redness or sig inflammation           Assessment & Plan:

## 2018-12-25 ENCOUNTER — Telehealth: Payer: Self-pay | Admitting: Pharmacist

## 2018-12-25 NOTE — Telephone Encounter (Signed)
Patient wants to cancel appt for lipid management next week. Seen by our clinic in the past and unable to tolerate Repatha 140mg . Will try Nextlizet for 1 week prior to submitting prior -authorization paperwork.   Patient will call back if unable to tolerate medication.   Medication Samples have been provided to the patient.  Drug name: Nextlizet       Strength: 180/10mg     Qty:7 [id number]  Exp.Date: dec/2022  Erick Oxendine Rodriguez-Guzman PharmD, BCPS, [place] [address] [place] [address] 12/25/2018 1:32 PM

## 2018-12-29 ENCOUNTER — Ambulatory Visit: Payer: BC Managed Care – PPO

## 2018-12-30 MED ORDER — FLUCONAZOLE 100 MG PO TABS: 100.0000 mg | ORAL_TABLET | Freq: Every day | ORAL | 1 refills | Status: DC

## 2018-12-31 ENCOUNTER — Telehealth: Payer: Self-pay | Admitting: Pharmacist Clinician (PhC)/ Clinical Pharmacy Specialist

## 2018-12-31 MED ORDER — NEXLIZET 180-10 MG PO TABS: 1.0000 | ORAL_TABLET | Freq: Every day | ORAL | 1 refills | Status: DC

## 2018-12-31 NOTE — Telephone Encounter (Signed)
Patient wife called to report patient doing well with Nexlizet sample.  Would like rx sent to [facility].   Rx sent

## 2019-01-12 ENCOUNTER — Telehealth: Payer: Self-pay

## 2019-01-12 NOTE — Telephone Encounter (Signed)
lmom for pa approval rx was previously sent on 12/31/18 and instructed the pt to call us back w/?'s

## 2019-01-30 LAB — HM DIABETES EYE EXAM

## 2019-02-19 ENCOUNTER — Encounter: Payer: Self-pay | Admitting: Internal Medicine

## 2019-02-19 ENCOUNTER — Other Ambulatory Visit: Payer: Self-pay

## 2019-02-19 ENCOUNTER — Ambulatory Visit (INDEPENDENT_AMBULATORY_CARE_PROVIDER_SITE_OTHER): Payer: BC Managed Care – PPO | Admitting: Internal Medicine

## 2019-02-19 DIAGNOSIS — I2510 Atherosclerotic heart disease of native coronary artery without angina pectoris: Secondary | ICD-10-CM | POA: Diagnosis not present

## 2019-02-19 DIAGNOSIS — G629 Polyneuropathy, unspecified: Secondary | ICD-10-CM

## 2019-02-19 DIAGNOSIS — Z0000 Encounter for general adult medical examination without abnormal findings: Secondary | ICD-10-CM

## 2019-02-19 NOTE — Assessment & Plan Note (Signed)
Doing well since the CABG ASA, beta blocker, cholesterol med

## 2019-02-19 NOTE — Progress Notes (Signed)
Subjective:    Patient ID: [name], male    DOB: 16-May-1955, 63 y.o.   MRN: [id number]  HPI Here for physical  He is concerned about easy bruising Arms and legs--but not trunk Will happen without trauma He wondered about the cholesterol medication--but discussed it is probably from the aspirin Working on [employer] many days (5 miles on weekends) Trying to eat carefully  Current Outpatient Medications on File Prior to Visit  Medication Sig Dispense Refill  . aspirin EC 81 MG tablet Take 81 mg by mouth daily.    . Bempedoic Acid-Ezetimibe (NEXLIZET) 180-10 MG TABS Take 1 tablet by mouth daily. 90 tablet 1  . fluconazole (DIFLUCAN) 100 MG tablet Take 1 tablet (100 mg total) by mouth daily. 7 tablet 1  . ketoconazole (NIZORAL) 2 % cream Apply 1 application topically 2 (two) times daily as needed for irritation. 60 g 3  . metoprolol tartrate (LOPRESSOR) 25 MG tablet Take 0.5 tablets (12.5 mg total) by mouth 2 (two) times daily. 90 tablet 3  . Multiple Vitamin (MULTIVITAMIN) tablet Take 1 tablet by mouth daily.    . pantoprazole (PROTONIX) 40 MG tablet TAKE 1 TABLET BY MOUTH EVERY DAY 90 tablet 3  . nitroGLYCERIN (NITROSTAT) 0.4 MG SL tablet Place 1 tablet (0.4 mg total) under the tongue every 5 (five) minutes as needed for chest pain. 25 tablet 3   No current facility-administered medications on file prior to visit.     Allergies  Allergen Reactions  . Atorvastatin     REACTION: muscle aches  . Simvastatin     REACTION: hand pain  . Pravachol [Pravastatin Sodium]     Muscle aching    Past Medical History:  Diagnosis Date  . CAD (coronary artery disease) 7/07   Cath for unstable angina--- 50-70% LAD, 40% circ  . ED (erectile dysfunction)   . GERD (gastroesophageal reflux disease)   . Hx of colonic polyps   . Hyperlipidemia   . Hypertension   . Impaired fasting glucose   . 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  Sleep disorder   . STEMI involving left anterior descending coronary artery ([place]) 04/12/2018  . Wrist fracture     6 foot fall onto head, concussion, L wrist (comminuted with intra-articular extension) and 2nd finger (middle phalanx) fxs, vertigo    Past Surgical History:  Procedure Laterality Date  . BRANCHIAL CLEFT CYST EXCISION    . CARDIAC CATHETERIZATION    . COLONOSCOPY  04/03/2007  . CORONARY ARTERY BYPASS GRAFT N/A 04/12/2018   Procedure: CORONARY ARTERY BYPASS GRAFTING (CABG) x two , using left internal mammary artery and right leg greater saphenous vein harvested endoscopically;  Surgeon: Rexene Alberts, MD;  Location: [place];  Service: Open Heart Surgery;  Laterality: N/A;  . CORONARY/GRAFT ACUTE MI REVASCULARIZATION N/A 04/12/2018   Procedure: Coronary/Graft Acute MI Revascularization;  Surgeon: Sherren Mocha, MD;  Location: [place] [facility];  Service: Cardiovascular;  Laterality: N/A;  . HEMORRHOID SURGERY    . LEFT HEART CATH AND CORONARY ANGIOGRAPHY N/A 04/12/2018   Procedure: LEFT HEART CATH AND CORONARY ANGIOGRAPHY;  Surgeon: Sherren Mocha, MD;  Location: [place] [facility];  Service: Cardiovascular;  Laterality: N/A;  . RHINOPLASTY    . TEE WITHOUT CARDIOVERSION N/A 04/12/2018   Procedure: TRANSESOPHAGEAL ECHOCARDIOGRAM (TEE);  Surgeon: Rexene Alberts, MD;  Location: [place];  Service: Open Heart Surgery;  Laterality:  N/A;    Family History  Problem Relation Age of Onset  . Coronary artery disease Mother   . Stroke Mother   . Coronary artery disease Brother   . Cancer Brother        colon or rectal  . Colon cancer Brother 64       half-brother   . Diabetes Neg Hx   . Stomach cancer Neg Hx     Social History   Socioeconomic History  . Marital status: Married    Spouse name: Designer, fashion/clothing  . Number of children: 4  . Years of education: Not on file  . Highest education level: High school graduate  Occupational History  . Occupation: installs signs     Employer: [employer]  . Financial resource strain: Not hard at all  . Food insecurity    Worry: Never true    Inability: Never true  . Transportation needs    Medical: No    Non-medical: No  Tobacco Use  . Smoking status: Former Smoker    Years: 35.00    Types: Cigarettes    Quit date: 10/27/2005    Years since quitting: 13.3  . Smokeless tobacco: Never Used  Substance and Sexual Activity  . Alcohol use: Yes    Comment: once a month-beer   . Drug use: No  . Sexual activity: Not on file  Lifestyle  . Physical activity    Days per week: 5 days    Minutes per session: 30 min  . Stress: Not at all  Relationships  . Social Herbalist on phone: Not on file    Gets together: Not on file    Attends religious service: Not on file    Active member of club or organization: Not on file    Attends meetings of clubs or organizations: Not on file    Relationship status: Not on file  . Intimate partner violence    Fear of current or ex partner: Not on file    Emotionally abused: Not on file    Physically abused: Not on file    Forced sexual activity: Not on file  Other Topics Concern  . Not on file  Social History Narrative  . Not on file   Review of Systems  Constitutional: Negative for fatigue and unexpected weight change.       Wears seat belt  HENT: Negative for dental problem, hearing loss and tinnitus.        Some trouble swallowing if he chews too fast  Eyes: Negative for visual disturbance.       No diplopia or unilateral vision loss  Respiratory: Negative for cough, chest tightness and shortness of breath.   Cardiovascular: Negative for chest pain, palpitations and leg swelling.  Gastrointestinal: Negative for blood in stool and constipation.       No heartburn  Endocrine: Negative for polydipsia and polyuria.  Genitourinary: Positive for frequency. Negative for difficulty urinating.       No sexual problems  Musculoskeletal: Positive for  back pain. Negative for arthralgias and joint swelling.       No meds---will use vibrating heat  Skin: Negative for rash.       No suspicious lesions  Allergic/Immunologic: Negative for environmental allergies and immunocompromised state.  Neurological: Negative for dizziness, syncope, light-headedness and headaches.  Hematological: Negative for adenopathy. Bruises/bleeds easily.  Psychiatric/Behavioral: Negative for dysphoric mood. The patient is not nervous/anxious.  Chronic sleep issues---wakes 3:30-4AM (inconsistent getting back to sleep)       Objective:   Physical Exam  Constitutional: He is oriented to person, place, and time. He appears well-developed. No distress.  HENT:  Head: Normocephalic and atraumatic.  Right Ear: External ear normal.  Left Ear: External ear normal.  Mouth/Throat: Oropharynx is clear and moist. No oropharyngeal exudate.  Eyes: Pupils are equal, round, and reactive to light. Conjunctivae are normal.  Neck: No thyromegaly present.  Cardiovascular: Normal rate, regular rhythm, normal heart sounds and intact distal pulses. Exam reveals no gallop.  No murmur heard. Respiratory: Breath sounds normal. No respiratory distress. He has no wheezes. He has no rales.  GI: Soft. There is no abdominal tenderness.  Musculoskeletal:        General: No tenderness or edema.  Lymphadenopathy:    He has no cervical adenopathy.  Neurological: He is alert and oriented to person, place, and time.  Skin: No rash noted. No erythema.  Psychiatric: He has a normal mood and affect. His behavior is normal.           Assessment & Plan:

## 2019-02-19 NOTE — Assessment & Plan Note (Signed)
Still has very mild symptoms in toes No progression

## 2019-02-19 NOTE — Assessment & Plan Note (Signed)
Doing well now Had flu vaccine He wishes to hold off on PSA Colon due again 2028 Exercising regularly

## 2019-02-26 DIAGNOSIS — E119 Type 2 diabetes mellitus without complications: Secondary | ICD-10-CM | POA: Diagnosis not present

## 2019-02-26 DIAGNOSIS — H2513 Age-related nuclear cataract, bilateral: Secondary | ICD-10-CM | POA: Diagnosis not present

## 2019-02-26 DIAGNOSIS — H5203 Hypermetropia, bilateral: Secondary | ICD-10-CM | POA: Diagnosis not present

## 2019-02-26 LAB — HM DIABETES EYE EXAM

## 2019-03-01 ENCOUNTER — Other Ambulatory Visit: Payer: Self-pay | Admitting: Internal Medicine

## 2019-03-09 ENCOUNTER — Encounter: Payer: Self-pay | Admitting: Internal Medicine

## 2019-03-20 ENCOUNTER — Other Ambulatory Visit: Payer: Self-pay | Admitting: Internal Medicine

## 2019-04-05 ENCOUNTER — Ambulatory Visit: Payer: BLUE CROSS/BLUE SHIELD | Admitting: Thoracic Surgery (Cardiothoracic Vascular Surgery)

## 2019-04-05 MED ORDER — TRAZODONE HCL 50 MG PO TABS: 50.0000 mg | ORAL_TABLET | Freq: Every day | ORAL | 3 refills | Status: DC

## 2019-04-26 ENCOUNTER — Ambulatory Visit: Payer: BC Managed Care – PPO | Admitting: Thoracic Surgery (Cardiothoracic Vascular Surgery)

## 2019-04-28 ENCOUNTER — Telehealth: Payer: Self-pay | Admitting: Cardiovascular Disease

## 2019-04-28 ENCOUNTER — Telehealth: Payer: Self-pay | Admitting: Internal Medicine

## 2019-04-28 NOTE — Telephone Encounter (Signed)
New Message:     Pt wants to switch from your service to Dr Gwenlyn Found service. Is this okay?

## 2019-04-28 NOTE — Telephone Encounter (Signed)
That is fine with me as well °

## 2019-04-28 NOTE — Telephone Encounter (Signed)
Yes that's fine with me. I am available anytime the patient needs to be seen.  GA

## 2019-04-28 NOTE — Telephone Encounter (Signed)
New Message:     Pt would like to switch from Dr [name] to Dr you please. Is this okay?

## 2019-05-03 ENCOUNTER — Other Ambulatory Visit: Payer: Self-pay

## 2019-05-03 ENCOUNTER — Ambulatory Visit: Payer: BC Managed Care – PPO | Admitting: Thoracic Surgery (Cardiothoracic Vascular Surgery)

## 2019-05-03 ENCOUNTER — Other Ambulatory Visit: Payer: Self-pay | Admitting: Internal Medicine

## 2019-05-03 ENCOUNTER — Encounter: Payer: Self-pay | Admitting: Thoracic Surgery (Cardiothoracic Vascular Surgery)

## 2019-05-03 ENCOUNTER — Other Ambulatory Visit: Payer: Self-pay | Admitting: Physician Assistant

## 2019-05-03 VITALS — BP 132/75 | HR 61 | Temp 97.7°F | Resp 20 | Ht 70.0 in | Wt 204.0 lb

## 2019-05-03 DIAGNOSIS — Z951 Presence of aortocoronary bypass graft: Secondary | ICD-10-CM

## 2019-05-03 NOTE — Progress Notes (Signed)
[address]       [place] [address]             [phone]     CARDIOTHORACIC SURGERY OFFICE NOTE  Referring Provider is Sherren Mocha, MD Primary Cardiologist is Elouise Munroe, MD PCP is Venia Carbon, MD   HPI:  Patient is a 64 year old male with known history of coronary artery disease, hypertension, hyperlipidemia, type 2 diabetes mellitus, and GE reflux disease who returns to the office today for routine follow-up status post emergency coronary artery bypass grafting x2 on April 12, 2018 for severe single-vessel coronary artery disease status post acute ST segment elevation myocardial infarction with coronary anatomy unfavorable for percutaneous coronary intervention.  He was last seen here in our office on June 29, 2018 at which time he was doing very well.  The patient states that he has done exceptionally well ever since.  He reports that he feels much better than he did prior to surgery and he is back to completely normal, unrestricted physical activity.  He is quite active physically and he states that he does not experience any chest discomfort or shortness of breath with exertion.  He has had some problems with tolerance of statin therapy and actually tried Repatha but developed pain in his back and has now been switched to a new medication for hyperlipidemia.  Otherwise he is doing very well    Current Outpatient Medications  Medication Sig Dispense Refill  . aspirin EC 81 MG tablet Take 81 mg by mouth daily.    . Bempedoic Acid-Ezetimibe (NEXLIZET) 180-10 MG TABS Take 1 tablet by mouth daily. 90 tablet 1  . fluconazole (DIFLUCAN) 100 MG tablet Take 1 tablet (100 mg total) by mouth daily. 7 tablet 1  . ketoconazole (NIZORAL) 2 % cream Apply 1 application topically 2 (two) times daily as needed for irritation. 60 g 3  . metoprolol tartrate (LOPRESSOR) 25 MG tablet Take 0.5 tablets (12.5 mg total) by mouth 2 (two) times daily. 90 tablet 3   . Multiple Vitamin (MULTIVITAMIN) tablet Take 1 tablet by mouth daily.    . pantoprazole (PROTONIX) 40 MG tablet TAKE 1 TABLET BY MOUTH EVERY DAY 90 tablet 3  . traZODone (DESYREL) 50 MG tablet Take 1-2 tablets (50-100 mg total) by mouth at bedtime. 60 tablet 3  . nitroGLYCERIN (NITROSTAT) 0.4 MG SL tablet Place 1 tablet (0.4 mg total) under the tongue every 5 (five) minutes as needed for chest pain. 25 tablet 3   No current facility-administered medications for this visit.      Physical Exam:   BP 132/75 (BP Location: Right Arm)   Pulse 61   Temp 97.7 F (36.5 C) (Skin)   Resp 20   Ht 5\' 10"  (1.778 m)   Wt 204 lb (92.5 kg)   SpO2 95% Comment: RA  BMI 29.27 kg/m   General:  Well-appearing  Chest:   Clear to auscultation  CV:   Regular rate and rhythm  Incisions:  Completely healed  Abdomen:  Soft nontender  Extremities:  Warm and well-perfused  Diagnostic Tests:  n/a   Impression:  Patient is doing very well approximately 1 year status post emergency coronary artery bypass grafting  Plan:  We have not recommended any change the patient's current medications.  The patient will continue to follow-up with his cardiologist and return to our office in the future only should specific problems or questions arise.  He has been  reminded of the many benefits of regular exercise and a heart healthy diet.  All of his questions have been answered.  I spent in excess of 10 minutes during the conduct of this office consultation and >50% of this time involved direct face-to-face encounter with the patient for counseling and/or coordination of their care.    Valentina Gu. Roxy Manns, MD 05/03/2019 1:38 PM

## 2019-05-03 NOTE — Patient Instructions (Signed)
Continue all previous medications without any changes at this time  You may resume unrestricted physical activity without any particular limitations at this time.  Make every effort to maintain a "heart-healthy" lifestyle with regular physical exercise and adherence to a low-fat, low-carbohydrate diet.  Continue to seek regular follow-up appointments with your primary care physician and/or cardiologist.

## 2019-05-18 ENCOUNTER — Ambulatory Visit: Payer: BC Managed Care – PPO | Admitting: Cardiovascular Disease

## 2019-05-31 ENCOUNTER — Telehealth: Payer: Self-pay | Admitting: Cardiovascular Disease

## 2019-05-31 NOTE — Telephone Encounter (Signed)
Wife of the patient wanted to know if she would be able to come in during the patient's upcoming appointment. The wife says the patient has a hard time hearing and will need a second set of ears. Please let her know what the office decides

## 2019-05-31 NOTE — Telephone Encounter (Signed)
Spoke to wife- she states that her husband is hard of hearing and does not always understand what is said in the visits.  I advised of our policy, but wife does all his medical appointments and handles medications, I advised it would be okay for her to accompany him to this appointment- but would check in with Dr.Berry to make sure he agreed. If he did not, or something should change we would call and let her know. Wife verbalized understanding.

## 2019-06-01 ENCOUNTER — Ambulatory Visit: Payer: BC Managed Care – PPO | Admitting: Cardiovascular Disease

## 2019-06-01 ENCOUNTER — Encounter: Payer: Self-pay | Admitting: Cardiovascular Disease

## 2019-06-01 ENCOUNTER — Other Ambulatory Visit: Payer: Self-pay

## 2019-06-01 VITALS — BP 138/82 | HR 65 | Ht 70.5 in | Wt 207.0 lb

## 2019-06-01 DIAGNOSIS — I10 Essential (primary) hypertension: Secondary | ICD-10-CM | POA: Diagnosis not present

## 2019-06-01 DIAGNOSIS — E785 Hyperlipidemia, unspecified: Secondary | ICD-10-CM | POA: Diagnosis not present

## 2019-06-01 DIAGNOSIS — Z951 Presence of aortocoronary bypass graft: Secondary | ICD-10-CM | POA: Diagnosis not present

## 2019-06-01 LAB — LIPID PANEL
Chol/HDL Ratio: 2.2 ratio (ref 0.0–5.0)
Cholesterol, Total: 127 mg/dL (ref 100–199)
HDL: 57 mg/dL (ref >39)
LDL Chol Calc (NIH): 55 mg/dL (ref 0–99)
Triglycerides: 73 mg/dL (ref 0–149)
VLDL Cholesterol Cal: 15 mg/dL (ref 5–40)

## 2019-06-01 LAB — HEPATIC FUNCTION PANEL
ALT: 19 IU/L (ref 0–44)
AST: 24 IU/L (ref 0–40)
Albumin: 4.6 g/dL (ref 3.8–4.8)
Alkaline Phosphatase: 100 IU/L (ref 39–117)
Bilirubin Total: 0.7 mg/dL (ref 0.0–1.2)
Bilirubin, Direct: 0.21 mg/dL (ref 0.00–0.40)
Total Protein: 7.1 g/dL (ref 6.0–8.5)

## 2019-06-01 NOTE — Assessment & Plan Note (Signed)
History of essential potential blood pressure measured today 138/82.  He is on metoprolol.

## 2019-06-01 NOTE — Assessment & Plan Note (Signed)
History of CAD status post emergency two-vessel CABG by Dr. Roxy Manns 04/12/2018 in the setting of a STEMI.  He underwent cardiac catheterization after [facility] Knack revealing true ostial LAD disease not favorable for percutaneous intervention.  He had a LIMA placed to his LAD and a vein to diagonal branch.  He is done well since and is currently asymptomatic.

## 2019-06-01 NOTE — Progress Notes (Signed)
06/01/2019 [name]   [birth year]  [id number]  Primary Physician Venia Carbon, MD Primary Cardiologist: Lorretta Harp MD [name], [place]  HPI:  [name] is a 64 y.o. mildly overweight married Caucasian male father of 43, grandfather 3 grandchildren who his brother-in-law is also a patient of mine.  He previously saw Dr. [name].  He is transferring to my service.  He works putting up signs.  His risk factors include remote tobacco abuse having quit 14 years ago, family history of heart disease with a mother who had myocardial infarction at age 63, hyperlipidemia and hypertension.  He had a STEMI 04/12/2018 and underwent cardiac catheterization emergently by Dr. Burt Knack revealing true ostial LAD disease not favorable for percutaneous intervention.  He underwent emergency CABG by Dr. Roxy Manns with a LIMA to his LAD and a vein to the diagonal branch.  He is currently asymptomatic and walks 7 miles a day on the weekends.  He does have hyperlipidemia intolerant to statin therapy.  He was also started on Repatha which she could not tolerate as well.   Current Meds  Medication Sig  . aspirin EC 81 MG tablet Take 81 mg by mouth daily.  . Bempedoic Acid-Ezetimibe (NEXLIZET) 180-10 MG TABS Take 1 tablet by mouth daily.  . fluconazole (DIFLUCAN) 100 MG tablet Take 1 tablet (100 mg total) by mouth daily.  Marland Kitchen ketoconazole (NIZORAL) 2 % cream Apply 1 application topically 2 (two) times daily as needed for irritation.  . metoprolol tartrate (LOPRESSOR) 25 MG tablet TAKE 0.5 TABLET (12.5 MG TOTAL) BY MOUTH 2 (TWO) TIMES DAILY  . Multiple Vitamin (MULTIVITAMIN) tablet Take 1 tablet by mouth daily.  . pantoprazole (PROTONIX) 40 MG tablet TAKE 1 TABLET BY MOUTH EVERY DAY  . traZODone (DESYREL) 50 MG tablet TAKE 1-2 TABLETS BY MOUTH AT BEDTIME.     Allergies  Allergen Reactions  . Atorvastatin     REACTION: muscle aches  . Repatha  [Evolocumab]   . Simvastatin     REACTION: hand pain  .  Pravachol [Pravastatin Sodium]     Muscle aching    Social History   Socioeconomic History  . Marital status: Married    Spouse name: Designer, fashion/clothing  . Number of children: 4  . Years of education: Not on file  . Highest education level: High school graduate  Occupational History  . Occupation: installs signs    Employer: [employer]  Tobacco Use  . Smoking status: Former Smoker    Years: 35.00    Types: Cigarettes    Quit date: 10/27/2005    Years since quitting: 13.6  . Smokeless tobacco: Never Used  Substance and Sexual Activity  . Alcohol use: Yes    Comment: once a month-beer   . Drug use: No  . Sexual activity: Not on file  Other Topics Concern  . Not on file  Social History Narrative  . Not on file   Social Determinants of Health   Financial Resource Strain: Low Risk   . Difficulty of Paying Living Expenses: Not hard at all  Food Insecurity: No Food Insecurity  . Worried About Charity fundraiser in the Last Year: Never true  . Ran Out of Food in the Last Year: Never true  Transportation Needs: No Transportation Needs  . Lack of Transportation (Medical): No  . Lack of Transportation (Non-Medical): No  Physical Activity: Sufficiently Active  . Days of Exercise per Week: 5 days  . Minutes  of Exercise per Session: 30 min  Stress: No Stress Concern Present  . Feeling of Stress : Not at all  Social Connections:   . Frequency of Communication with Friends and Family: Not on file  . Frequency of Social Gatherings with Friends and Family: Not on file  . Attends Religious Services: Not on file  . Active Member of Clubs or Organizations: Not on file  . Attends Archivist Meetings: Not on file  . Marital Status: Not on file  Intimate Partner Violence:   . Fear of Current or Ex-Partner: Not on file  . Emotionally Abused: Not on file  . Physically Abused: Not on file  . Sexually Abused: Not on file     Review of Systems: General: negative for chills,  fever, night sweats or weight changes.  Cardiovascular: negative for chest pain, dyspnea on exertion, edema, orthopnea, palpitations, paroxysmal nocturnal dyspnea or shortness of breath Dermatological: negative for rash Respiratory: negative for cough or wheezing Urologic: negative for hematuria Abdominal: negative for nausea, vomiting, diarrhea, bright red blood per rectum, melena, or hematemesis Neurologic: negative for visual changes, syncope, or dizziness All other systems reviewed and are otherwise negative except as noted above.    Blood pressure 138/82, pulse 65, height 5' 10.5" (1.791 m), weight 207 lb (93.9 kg), SpO2 98 %.  General appearance: alert and no distress Neck: no adenopathy, no carotid bruit, no JVD, supple, symmetrical, trachea midline and thyroid not enlarged, symmetric, no tenderness/mass/nodules Lungs: clear to auscultation bilaterally Heart: regular rate and rhythm, S1, S2 normal, no murmur, click, rub or gallop Extremities: extremities normal, atraumatic, no cyanosis or edema Pulses: 2+ and symmetric Skin: Skin color, texture, turgor normal. No rashes or lesions Neurologic: Alert and oriented X 3, normal strength and tone. Normal symmetric reflexes. Normal coordination and gait  EKG sinus rhythm at 65 with left axis deviation and septal Q waves.  I personally reviewed this EKG.  ASSESSMENT AND PLAN:   Hyperlipemia History of hyperlipidemia intolerant to statin therapy currently on Zetia and Bempedoic acid.  We will check a lipid liver profile today.  He has been on Repatha in the past as well which was discontinued because of bruising  Essential hypertension, benign History of essential potential blood pressure measured today 138/82.  He is on metoprolol.  S/P emergency CABG x 2 History of CAD status post emergency two-vessel CABG by Dr. Roxy Manns 04/12/2018 in the setting of a STEMI.  He underwent cardiac catheterization after Burt Knack revealing true ostial LAD  disease not favorable for percutaneous intervention.  He had a LIMA placed to his LAD and a vein to diagonal branch.  He is done well since and is currently asymptomatic.      Lorretta Harp MD FACP,FACC,FAHA, [facility] 06/01/2019 9:41 AM

## 2019-06-01 NOTE — Telephone Encounter (Signed)
That is fine with me.

## 2019-06-01 NOTE — Patient Instructions (Signed)
Medication Instructions:  Your physician recommends that you continue on your current medications as directed. Please refer to the Current Medication list given to you today.  If you need a refill on your cardiac medications before your next appointment, please call your pharmacy.   Lab work: Fasting Lipids and Hepatic Function If you have labs (blood work) drawn today and your tests are completely normal, you will receive your results only by: MyChart Message (if you have MyChart) OR A paper copy in the mail If you have any lab test that is abnormal or we need to change your treatment, we will call you to review the results.  Testing/Procedures: NONE  Follow-Up: At [facility] you and your health needs are our priority.  As part of our continuing mission to provide you with exceptional heart care, we have created designated Provider Care Teams.  These Care Teams include your primary Cardiologist (physician) and Advanced Practice Providers (APPs -  Physician Assistants and Nurse Practitioners) who all work together to provide you with the care you need, when you need it. You may see Dr. Gwenlyn Found or one of the following Advanced Practice Providers on your designated Care Team:    Kerin Ransom, PA-C  [place], [place]  Coletta Memos, [place]  Your physician wants you to follow-up in: 1 year with Dr. Gwenlyn Found

## 2019-06-01 NOTE — Assessment & Plan Note (Signed)
History of hyperlipidemia intolerant to statin therapy currently on Zetia and Bempedoic acid.  We will check a lipid liver profile today.  He has been on Repatha in the past as well which was discontinued because of bruising

## 2019-06-13 ENCOUNTER — Other Ambulatory Visit: Payer: Self-pay | Admitting: Internal Medicine

## 2019-06-28 ENCOUNTER — Other Ambulatory Visit: Payer: Self-pay | Admitting: Pharmacist

## 2019-06-28 MED ORDER — NEXLIZET 180-10 MG PO TABS: 1.0000 | ORAL_TABLET | Freq: Every day | ORAL | 3 refills | Status: DC

## 2019-06-29 ENCOUNTER — Other Ambulatory Visit: Payer: Self-pay

## 2019-07-02 ENCOUNTER — Other Ambulatory Visit: Payer: Self-pay

## 2019-07-02 MED ORDER — NEXLIZET 180-10 MG PO TABS: 1.0000 | ORAL_TABLET | Freq: Every day | ORAL | 3 refills | Status: DC

## 2019-07-23 ENCOUNTER — Ambulatory Visit: Payer: BC Managed Care – PPO | Attending: Internal Medicine

## 2019-07-23 DIAGNOSIS — Z23 Encounter for immunization: Secondary | ICD-10-CM

## 2019-07-23 NOTE — Progress Notes (Signed)
   Covid-19 Vaccination Clinic  Name:  [name]    MRN: [id number] DOB: [birth year]  07/23/2019  [name] was observed post Covid-19 immunization for 15 minutes without incident. He was provided with Vaccine Information Sheet and instruction to access the V-Safe system.   [name] was instructed to call 911 with any severe reactions post vaccine: Marland Kitchen Difficulty breathing  . Swelling of face and throat  . A fast heartbeat  . A bad rash all over body  . Dizziness and weakness   Immunizations Administered    Name Date Dose VIS Date Route   Pfizer COVID-19 Vaccine 07/23/2019  4:15 PM 0.3 mL 04/09/2019 Intramuscular   Manufacturer: [place]   Lot: [id number]   [place]: [id number]

## 2019-07-26 ENCOUNTER — Telehealth: Payer: Self-pay

## 2019-07-26 NOTE — Telephone Encounter (Signed)
Glad to hear he is feeling better. Will just follow up as needed

## 2019-07-26 NOTE — Telephone Encounter (Signed)
I spoke with [name] (DPR signed) pts is feeling some better this morning;T now 99.3 after Tylenol, still has some H/A but not as painful; pain level now is 3. Pt is eating,drinking and urinating normally. New symptoms this morning when pt awoke is a S/T.[name] Schrick does not think pt needs a virtual appt at this time and she will cb if pt condition changes or worsens. FYI to Dr Willa Frater.

## 2019-07-26 NOTE — Telephone Encounter (Signed)
[place] Night - Client TELEPHONE ADVICE RECORD [facility] Patient Name: [name] Gender: Male DOB: 02-10-1956 Age: 64 Y 3 M 28 D Return Phone Number: [phone] (Primary) Address: City/State/Zip: [place] [address] Client [facility] Night - Client Client Site [place] Physician Viviana Simpler - MD Contact Type Call Who Is Calling Patient / Member / Family / Caregiver Call Type Triage / Clinical Caller Name [facility] Relationship To Patient Spouse Return Phone Number [phone] (Primary) Chief Complaint Fever (non urgent symptom) (> THREE MONTHS) Reason for Call Symptomatic / Request for [place] states she her husband got a COVID shot Friday afternoon and he is getting sicker. His temp is up to 101.5 and has been in bed since yesterday. Translation No Nurse Assessment Nurse: Lucky Cowboy, RN, Levada Dy Date/Time (Eastern Time): 07/25/2019 10:42:35 AM Confirm and document reason for call. If symptomatic, describe symptoms. ---Caller stated that husband had COVID vaccine on Friday. He's been in the bed since yesterday. He has temp 101.5 orally. He is on speaker phone and wife was angry that we had to speak with him and that the MD didn't answer the phone directly themselves. He has a headache and is not able to touch his chin to his chest. He rates headache at 6/10 currently, and that's after Tylenol. He has been drinking, eating, and urinating ok. Has the patient had close contact with a person known or suspected to have the novel coronavirus illness OR traveled / lives in area with major community spread (including international travel) in the last 14 days from the onset of symptoms? * If Asymptomatic, screen for exposure and travel within the last 14 days. ---No Does the patient have any new or worsening symptoms? ---Yes Will a triage be completed?  ---Yes Related visit to physician within the last 2 weeks? ---No Does the PT have any chronic conditions? (i.e. diabetes, asthma, this includes High risk factors for pregnancy, etc.) ---Yes List chronic conditions. ---hx CABG, HTN, high cholesterol, GERD, seasonal allergies Is this a behavioral health or substance abuse call? ---No PLEASE NOTE: All timestamps contained within this report are represented as [place] Standard Time. CONFIDENTIALTY NOTICE: This fax transmission is intended only for the addressee. It contains information that is legally privileged, confidential or otherwise protected from use or disclosure. If you are not the intended recipient, you are strictly prohibited from reviewing, disclosing, copying using or disseminating any of this information or taking any action in reliance on or regarding this information. If you have received this fax in error, please notify us immediately by telephone so that we can arrange for its return to [place]. Phone: [phone], Toll-Free: [phone], Fax: [phone] Page: 2 of 2 Call Id: [id number] Guidelines Guideline Title Affirmed Question Affirmed Notes Nurse Date/Time Eilene Ghazi Time) COVID-19 - Vaccine Questions and Reactions COVID-19 vaccine, systemic reactions (e.g., fatigue, fever, muscle aches), questions about Lucky Cowboy, RN, Levada Dy 07/25/2019 10:47:17 AM Disp. Time Eilene Ghazi Time) Disposition Final User 07/25/2019 10:53:28 AM Home Care Yes Dew, RN, Marin Shutter Disagree/Comply Comply Caller Understands Yes PreDisposition Call Doctor Care Advice Given Per Guideline HOME CARE: * You should be able to treat this at home. COVID-19 VACCINE - COMMON REACTIONS: * Local pain, redness, or swelling at injection site * Feeling tired (fatigue) * Fever and chills * Headache * Muscle aches or joint pains * Symptoms usually last 1 to 2 days. PAIN AND FEVER MEDICINES: * For pain or fever  relief, take either acetaminophen or ibuprofen. * They  are over-the-counter (OTC) drugs that help treat both fever and pain. You can buy them at the drugstore. * Treat fevers above 101 F (38.3 C). The goal of fever therapy is to bring the fever down to a comfortable level. Remember that fever medicine usually lowers fever 2 degrees F (1 - 1 1/2 degrees C). CALL BACK IF: * Fever lasts over 3 days * Pain at injection site not improving after 3 days * Swollen lymph node lasts over 3 weeks * You become worse. CARE ADVICE given per COVID-19 - Vaccine Questions and Reactions (Adult) guideline. Comments User: Raford Pitcher, RN Date/Time Eilene Ghazi Time): 07/25/2019 10:49:28 AM After he sat up, he was able to touch his chin to his chest. User: Raford Pitcher, RN Date/Time Eilene Ghazi Time): 07/25/2019 10:55:39 AM Reassured caller that we get all of our information approved by MD before we give information based on answers. Explained that due to call volume, there was no way the MD could answer every call received after hours. Further ins that getting up and moving around a bit out of bed would help prevent pneumonia from starting since caller wants to stay in bed.

## 2019-08-05 DIAGNOSIS — U071 COVID-19: Secondary | ICD-10-CM | POA: Diagnosis not present

## 2019-08-05 DIAGNOSIS — Z03818 Encounter for observation for suspected exposure to other biological agents ruled out: Secondary | ICD-10-CM | POA: Diagnosis not present

## 2019-08-05 DIAGNOSIS — Z20828 Contact with and (suspected) exposure to other viral communicable diseases: Secondary | ICD-10-CM | POA: Diagnosis not present

## 2019-08-17 ENCOUNTER — Ambulatory Visit: Payer: BC Managed Care – PPO | Attending: Internal Medicine

## 2019-08-17 DIAGNOSIS — Z23 Encounter for immunization: Secondary | ICD-10-CM

## 2019-08-31 ENCOUNTER — Other Ambulatory Visit: Payer: Self-pay

## 2019-08-31 ENCOUNTER — Other Ambulatory Visit: Payer: Self-pay | Admitting: Internal Medicine

## 2019-08-31 ENCOUNTER — Other Ambulatory Visit: Payer: Self-pay | Admitting: Cardiovascular Disease

## 2019-08-31 DIAGNOSIS — Z951 Presence of aortocoronary bypass graft: Secondary | ICD-10-CM

## 2019-08-31 NOTE — Telephone Encounter (Signed)
New Message   *STAT* If patient is at the pharmacy, call can be transferred to refill team.   1. Which medications need to be refilled? (please list name of each medication and dose if known)    nitroGLYCERIN (NITROSTAT) 0.4 MG SL tablet(Expired)   2. Which pharmacy/location (including street and city if local pharmacy) is medication to be sent to?  [id number] - [place], [place] - [address]  3. Do they need a 30 day or 90 day supply?  90 supply

## 2019-11-26 ENCOUNTER — Encounter: Payer: Self-pay | Admitting: Internal Medicine

## 2019-11-26 ENCOUNTER — Ambulatory Visit: Payer: BC Managed Care – PPO | Admitting: Internal Medicine

## 2019-11-26 ENCOUNTER — Other Ambulatory Visit: Payer: Self-pay

## 2019-11-26 VITALS — BP 114/66 | HR 54 | Temp 97.5°F | Ht 70.5 in | Wt 224.0 lb

## 2019-11-26 DIAGNOSIS — Z125 Encounter for screening for malignant neoplasm of prostate: Secondary | ICD-10-CM

## 2019-11-26 DIAGNOSIS — R8290 Unspecified abnormal findings in urine: Secondary | ICD-10-CM | POA: Diagnosis not present

## 2019-11-26 DIAGNOSIS — R238 Other skin changes: Secondary | ICD-10-CM

## 2019-11-26 DIAGNOSIS — I10 Essential (primary) hypertension: Secondary | ICD-10-CM | POA: Diagnosis not present

## 2019-11-26 DIAGNOSIS — R233 Spontaneous ecchymoses: Secondary | ICD-10-CM

## 2019-11-26 LAB — CBC
HCT: 44.6 % (ref 39.0–52.0)
Hemoglobin: 15 g/dL (ref 13.0–17.0)
MCHC: 33.6 g/dL (ref 30.0–36.0)
MCV: 90.7 fl (ref 78.0–100.0)
Platelets: 265 10*3/uL (ref 150.0–400.0)
RBC: 4.92 Mil/uL (ref 4.22–5.81)
RDW: 13 % (ref 11.5–15.5)
WBC: 7.2 10*3/uL (ref 4.0–10.5)

## 2019-11-26 LAB — T4, FREE: Free T4: 1.06 ng/dL (ref 0.60–1.60)

## 2019-11-26 LAB — POC URINALSYSI DIPSTICK (AUTOMATED)
Bilirubin, UA: NEGATIVE
Blood, UA: NEGATIVE
Glucose, UA: NEGATIVE
Ketones, UA: NEGATIVE
Leukocytes, UA: NEGATIVE
Nitrite, UA: NEGATIVE
Protein, UA: NEGATIVE
Spec Grav, UA: 1.025 (ref 1.010–1.025)
Urobilinogen, UA: 0.2 E.U./dL
pH, UA: 5.5 (ref 5.0–8.0)

## 2019-11-26 LAB — LIPID PANEL
Cholesterol: 119 mg/dL (ref 0–200)
HDL: 50.4 mg/dL (ref >39.00)
LDL Cholesterol: 56 mg/dL (ref 0–99)
NonHDL: 68.15
Total CHOL/HDL Ratio: 2
Triglycerides: 62 mg/dL (ref 0.0–149.0)
VLDL: 12.4 mg/dL (ref 0.0–40.0)

## 2019-11-26 LAB — HEPATIC FUNCTION PANEL
ALT: 20 U/L (ref 0–53)
AST: 23 U/L (ref 0–37)
Albumin: 4.2 g/dL (ref 3.5–5.2)
Alkaline Phosphatase: 72 U/L (ref 39–117)
Bilirubin, Direct: 0.1 mg/dL (ref 0.0–0.3)
Total Bilirubin: 0.5 mg/dL (ref 0.2–1.2)
Total Protein: 6.6 g/dL (ref 6.0–8.3)

## 2019-11-26 LAB — RENAL FUNCTION PANEL
Albumin: 4.2 g/dL (ref 3.5–5.2)
BUN: 17 mg/dL (ref 6–23)
CO2: 30 mEq/L (ref 19–32)
Calcium: 9.4 mg/dL (ref 8.4–10.5)
Chloride: 103 mEq/L (ref 96–112)
Creatinine, Ser: 1.03 mg/dL (ref 0.40–1.50)
GFR: 72.55 mL/min (ref >60.00)
Glucose, Bld: 107 mg/dL — ABNORMAL HIGH (ref 70–99)
Phosphorus: 2.9 mg/dL (ref 2.3–4.6)
Potassium: 4.6 mEq/L (ref 3.5–5.1)
Sodium: 137 mEq/L (ref 135–145)

## 2019-11-26 LAB — PSA: PSA: 0.41 ng/mL (ref 0.10–4.00)

## 2019-11-26 MED ORDER — FLUTICASONE PROPIONATE 50 MCG/ACT NA SUSP: 2.0000 | Freq: Every day | NASAL | 3 refills | Status: DC

## 2019-11-26 NOTE — Progress Notes (Signed)
Subjective:    Patient ID: [name], male    DOB: February 21, 1956, 64 y.o.   MRN: [id number]  HPI Here due to abnormal urine odor This visit occurred during the SARS-CoV-2 public health emergency.  Safety protocols were in place, including screening questions prior to the visit, additional usage of staff PPE, and extensive cleaning of exam room while observing appropriate contact time as indicated for disinfecting solutions.   Has been concerned about easy bruising Notes since CABG Mostly on his arms Only on ASA  Noted urine "stinking"-- for past month or so No dysuria----but slight stinging if he is dehydrated No blood No urgency  Current Outpatient Medications on File Prior to Visit  Medication Sig Dispense Refill  . aspirin EC 81 MG tablet Take 81 mg by mouth daily.    . Bempedoic Acid-Ezetimibe (NEXLIZET) 180-10 MG TABS Take 1 tablet by mouth daily. 90 tablet 3  . ketoconazole (NIZORAL) 2 % cream Apply 1 application topically 2 (two) times daily as needed for irritation. 60 g 3  . metoprolol tartrate (LOPRESSOR) 25 MG tablet TAKE 0.5 TABLET (12.5 MG TOTAL) BY MOUTH 2 (TWO) TIMES DAILY 90 tablet 3  . Multiple Vitamin (MULTIVITAMIN) tablet Take 1 tablet by mouth daily.    . pantoprazole (PROTONIX) 40 MG tablet TAKE 1 TABLET BY MOUTH EVERY DAY 90 tablet 3  . traZODone (DESYREL) 50 MG tablet TAKE 1-2 TABLETS BY MOUTH AT BEDTIME. 180 tablet 2  . nitroGLYCERIN (NITROSTAT) 0.4 MG SL tablet Place 1 tablet (0.4 mg total) under the tongue every 5 (five) minutes as needed for chest pain. 25 tablet 3   No current facility-administered medications on file prior to visit.    Allergies  Allergen Reactions  . Atorvastatin     REACTION: muscle aches  . Repatha  [Evolocumab]   . Simvastatin     REACTION: hand pain  . Pravachol [Pravastatin Sodium]     Muscle aching    Past Medical History:  Diagnosis Date  . CAD (coronary artery disease) 7/07   Cath for unstable angina--- 50-70% LAD,  40% circ  . ED (erectile dysfunction)   . GERD (gastroesophageal reflux disease)   . Hx of colonic polyps   . Hyperlipidemia   . Hypertension   . Impaired fasting glucose   . 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 Sleep disorder   . STEMI involving left anterior descending coronary artery ([place]) 04/12/2018  . Wrist fracture     6 foot fall onto head, concussion, L wrist (comminuted with intra-articular extension) and 2nd finger (middle phalanx) fxs, vertigo    Past Surgical History:  Procedure Laterality Date  . BRANCHIAL CLEFT CYST EXCISION    . CARDIAC CATHETERIZATION    . COLONOSCOPY  04/03/2007  . CORONARY ARTERY BYPASS GRAFT N/A 04/12/2018   Procedure: CORONARY ARTERY BYPASS GRAFTING (CABG) x two , using left internal mammary artery and right leg greater saphenous vein harvested endoscopically;  Surgeon: Rexene Alberts, MD;  Location: [facility];  Service: Open Heart Surgery;  Laterality: N/A;  . CORONARY/GRAFT ACUTE MI REVASCULARIZATION N/A 04/12/2018   Procedure: Coronary/Graft Acute MI Revascularization;  Surgeon: Sherren Mocha, MD;  Location: [facility];  Service: Cardiovascular;  Laterality: N/A;  . HEMORRHOID SURGERY    . LEFT HEART CATH AND CORONARY ANGIOGRAPHY N/A 04/12/2018   Procedure: LEFT HEART CATH AND CORONARY ANGIOGRAPHY;  Surgeon: Sherren Mocha, MD;  Location: [facility];  Service: Cardiovascular;  Laterality: N/A;  . RHINOPLASTY    . TEE WITHOUT CARDIOVERSION N/A 04/12/2018   Procedure: TRANSESOPHAGEAL ECHOCARDIOGRAM (TEE);  Surgeon: Rexene Alberts, MD;  Location: [facility];  Service: Open Heart Surgery;  Laterality: N/A;    Family History  Problem Relation Age of Onset  . Coronary artery disease Mother   . Stroke Mother   . Coronary artery disease Brother   . Cancer Brother        colon or rectal  . Colon cancer Brother 77       half-brother   . Diabetes Neg Hx   . Stomach cancer Neg Hx      Social History   Socioeconomic History  . Marital status: Married    Spouse name: Designer, fashion/clothing  . Number of children: 4  . Years of education: Not on file  . Highest education level: High school graduate  Occupational History  . Occupation: installs signs    Employer: [employer]  Tobacco Use  . Smoking status: Former Smoker    Years: 35.00    Types: Cigarettes    Quit date: 10/27/2005    Years since quitting: 14.0  . Smokeless tobacco: Never Used  Vaping Use  . Vaping Use: Never used  Substance and Sexual Activity  . Alcohol use: Yes    Comment: once a month-beer   . Drug use: No  . Sexual activity: Not on file  Other Topics Concern  . Not on file  Social History Narrative  . Not on file   Social Determinants of Health   Financial Resource Strain:   . Difficulty of Paying Living Expenses:   Food Insecurity:   . Worried About Charity fundraiser in the Last Year:   . Arboriculturist in the Last Year:   Transportation Needs:   . Film/video editor (Medical):   Marland Kitchen Lack of Transportation (Non-Medical):   Physical Activity:   . Days of Exercise per Week:   . Minutes of Exercise per Session:   Stress:   . Feeling of Stress :   Social Connections:   . Frequency of Communication with Friends and Family:   . Frequency of Social Gatherings with Friends and Family:   . Attends Religious Services:   . Active Member of Clubs or Organizations:   . Attends Archivist Meetings:   Marland Kitchen Marital Status:   Intimate Partner Violence:   . Fear of Current or Ex-Partner:   . Emotionally Abused:   Marland Kitchen Physically Abused:   . Sexually Abused:    Review of Systems Does take 1 multivitamin No other medications    Objective:   Physical Exam Abdominal:     Palpations: Abdomen is soft. There is no mass.     Tenderness: There is no abdominal tenderness.     Comments: No suprapubic dullness  Musculoskeletal:     Comments: No CVA tenderness  Skin:    Comments:  Slight ecchymotic areas only on arms            Assessment & Plan:

## 2019-11-26 NOTE — Assessment & Plan Note (Signed)
BP Readings from Last 3 Encounters:  11/26/19 114/66  06/01/19 138/82  05/03/19 132/75   Good control

## 2019-11-26 NOTE — Assessment & Plan Note (Signed)
Urinalysis benign Doesn't sound like infection Unclear if from vitamin, dehydration, etc Encouraged more fluids--doesn't seem to be concerning though

## 2019-11-26 NOTE — Assessment & Plan Note (Signed)
Doesn't sound pathologic Will just check some labs

## 2019-12-08 ENCOUNTER — Other Ambulatory Visit: Payer: Self-pay | Admitting: Internal Medicine

## 2020-02-11 DIAGNOSIS — H2513 Age-related nuclear cataract, bilateral: Secondary | ICD-10-CM | POA: Diagnosis not present

## 2020-02-11 DIAGNOSIS — H25012 Cortical age-related cataract, left eye: Secondary | ICD-10-CM | POA: Diagnosis not present

## 2020-02-11 DIAGNOSIS — H5203 Hypermetropia, bilateral: Secondary | ICD-10-CM | POA: Diagnosis not present

## 2020-02-11 DIAGNOSIS — E119 Type 2 diabetes mellitus without complications: Secondary | ICD-10-CM | POA: Diagnosis not present

## 2020-02-25 ENCOUNTER — Encounter: Payer: Self-pay | Admitting: Internal Medicine

## 2020-02-25 ENCOUNTER — Ambulatory Visit (INDEPENDENT_AMBULATORY_CARE_PROVIDER_SITE_OTHER): Payer: BC Managed Care – PPO | Admitting: Internal Medicine

## 2020-02-25 ENCOUNTER — Other Ambulatory Visit: Payer: Self-pay

## 2020-02-25 VITALS — BP 112/72 | HR 54 | Temp 97.3°F | Ht 69.5 in | Wt 227.0 lb

## 2020-02-25 DIAGNOSIS — Z0000 Encounter for general adult medical examination without abnormal findings: Secondary | ICD-10-CM

## 2020-02-25 DIAGNOSIS — I2510 Atherosclerotic heart disease of native coronary artery without angina pectoris: Secondary | ICD-10-CM | POA: Diagnosis not present

## 2020-02-25 DIAGNOSIS — I10 Essential (primary) hypertension: Secondary | ICD-10-CM | POA: Diagnosis not present

## 2020-02-25 NOTE — Progress Notes (Signed)
Subjective:    Patient ID: [name], male    DOB: [birth year], 64 y.o.   MRN: [id number]  HPI Here for physical This visit occurred during the SARS-CoV-2 public health emergency.  Safety protocols were in place, including screening questions prior to the visit, additional usage of staff PPE, and extensive cleaning of exam room while observing appropriate contact time as indicated for disinfecting solutions.   No new problems Continues to work  Still carries nitro--but has never used it Urine odor was from vitamins--went away when he stopped  Current Outpatient Medications on File Prior to Visit  Medication Sig Dispense Refill  . aspirin EC 81 MG tablet Take 81 mg by mouth daily.    . Bempedoic Acid-Ezetimibe (NEXLIZET) 180-10 MG TABS Take 1 tablet by mouth daily. 90 tablet 3  . fluticasone (FLONASE) 50 MCG/ACT nasal spray Place 2 sprays into both nostrils daily. 48 g 3  . metoprolol tartrate (LOPRESSOR) 25 MG tablet TAKE 0.5 TABLET (12.5 MG TOTAL) BY MOUTH 2 (TWO) TIMES DAILY 90 tablet 3  . Multiple Vitamin (MULTIVITAMIN) tablet Take 1 tablet by mouth daily.    . pantoprazole (PROTONIX) 40 MG tablet TAKE 1 TABLET BY MOUTH EVERY DAY 90 tablet 3  . traZODone (DESYREL) 50 MG tablet TAKE 1-2 TABLETS BY MOUTH AT BEDTIME. 180 tablet 2  . ketoconazole (NIZORAL) 2 % cream Apply 1 application topically 2 (two) times daily as needed for irritation. (Patient not taking: Reported on 02/25/2020) 60 g 3   No current facility-administered medications on file prior to visit.    Allergies  Allergen Reactions  . Atorvastatin     REACTION: muscle aches  . Repatha  [Evolocumab]   . Simvastatin     REACTION: hand pain  . Pravachol [Pravastatin Sodium]     Muscle aching    Past Medical History:  Diagnosis Date  . CAD (coronary artery disease) 7/07   Cath for unstable angina--- 50-70% LAD, 40% circ  . ED (erectile dysfunction)   . GERD (gastroesophageal reflux disease)   . Hx of colonic  polyps   . Hyperlipidemia   . Hypertension   . Impaired fasting glucose   . 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 Sleep disorder   . STEMI involving left anterior descending coronary artery ([place]) 04/12/2018  . Wrist fracture     6 foot fall onto head, concussion, L wrist (comminuted with intra-articular extension) and 2nd finger (middle phalanx) fxs, vertigo    Past Surgical History:  Procedure Laterality Date  . BRANCHIAL CLEFT CYST EXCISION    . CARDIAC CATHETERIZATION    . COLONOSCOPY  04/03/2007  . CORONARY ARTERY BYPASS GRAFT N/A 04/12/2018   Procedure: CORONARY ARTERY BYPASS GRAFTING (CABG) x two , using left internal mammary artery and right leg greater saphenous vein harvested endoscopically;  Surgeon: Rexene Alberts, MD;  Location: [place];  Service: Open Heart Surgery;  Laterality: N/A;  . CORONARY/GRAFT ACUTE MI REVASCULARIZATION N/A 04/12/2018   Procedure: Coronary/Graft Acute MI Revascularization;  Surgeon: Sherren Mocha, MD;  Location: [facility];  Service: Cardiovascular;  Laterality: N/A;  . HEMORRHOID SURGERY    . LEFT HEART CATH AND CORONARY ANGIOGRAPHY N/A 04/12/2018   Procedure: LEFT HEART CATH AND CORONARY ANGIOGRAPHY;  Surgeon: Sherren Mocha, MD;  Location: [place] [facility];  Service: Cardiovascular;  Laterality: N/A;  . RHINOPLASTY    . TEE WITHOUT CARDIOVERSION N/A  04/12/2018   Procedure: TRANSESOPHAGEAL ECHOCARDIOGRAM (TEE);  Surgeon: Rexene Alberts, MD;  Location: [place];  Service: Open Heart Surgery;  Laterality: N/A;    Family History  Problem Relation Age of Onset  . Coronary artery disease Mother   . Stroke Mother   . Coronary artery disease Brother   . Cancer Brother        colon or rectal  . Colon cancer Brother 43       half-brother   . Diabetes Neg Hx   . Stomach cancer Neg Hx     Social History   Socioeconomic History  . Marital status: Married    Spouse name: Designer, fashion/clothing  .  Number of children: 4  . Years of education: Not on file  . Highest education level: High school graduate  Occupational History  . Occupation: installs signs    Employer: [employer]  Tobacco Use  . Smoking status: Former Smoker    Years: 35.00    Types: Cigarettes    Quit date: 10/27/2005    Years since quitting: 14.3  . Smokeless tobacco: Never Used  Vaping Use  . Vaping Use: Never used  Substance and Sexual Activity  . Alcohol use: Yes    Comment: once a month-beer   . Drug use: No  . Sexual activity: Not on file  Other Topics Concern  . Not on file  Social History Narrative  . Not on file   Social Determinants of Health   Financial Resource Strain:   . Difficulty of Paying Living Expenses: Not on file  Food Insecurity:   . Worried About Charity fundraiser in the Last Year: Not on file  . Ran Out of Food in the Last Year: Not on file  Transportation Needs:   . Lack of Transportation (Medical): Not on file  . Lack of Transportation (Non-Medical): Not on file  Physical Activity:   . Days of Exercise per Week: Not on file  . Minutes of Exercise per Session: Not on file  Stress:   . Feeling of Stress : Not on file  Social Connections:   . Frequency of Communication with Friends and Family: Not on file  . Frequency of Social Gatherings with Friends and Family: Not on file  . Attends Religious Services: Not on file  . Active Member of Clubs or Organizations: Not on file  . Attends Archivist Meetings: Not on file  . Marital Status: Not on file  Intimate Partner Violence:   . Fear of Current or Ex-Partner: Not on file  . Emotionally Abused: Not on file  . Physically Abused: Not on file  . Sexually Abused: Not on file   Review of Systems  Constitutional:       Has gained back his weight Still walks regularly---not as careful with eating Wears seat belt  HENT: Negative for dental problem, hearing loss and tinnitus.        Keeps up with dentist   Eyes: Negative for visual disturbance.       No diplopia or unilateral vision loss  Respiratory: Negative for cough, chest tightness and shortness of breath.   Cardiovascular: Negative for chest pain, palpitations and leg swelling.  Gastrointestinal: Negative for blood in stool and constipation.       No heartburn  Endocrine: Negative for polydipsia and polyuria.  Genitourinary: Negative for difficulty urinating and urgency.       No sexual problems  Musculoskeletal: Negative for back pain and joint  swelling.       Some hand pain  Skin: Negative for rash.  Allergic/Immunologic: Positive for environmental allergies. Negative for immunocompromised state.       Flonase works  Neurological: Negative for syncope and headaches.       Some dizziness if pops up quickly Slight feet tingling---same hand symptoms  Hematological: Negative for adenopathy. Bruises/bleeds easily.  Psychiatric/Behavioral: Negative for dysphoric mood. The patient is not nervous/anxious.        Rarely needs trazodone to help sleep       Objective:   Physical Exam Constitutional:      Appearance: Normal appearance.  HENT:     Head: Normocephalic and atraumatic.     Right Ear: Tympanic membrane, ear canal and external ear normal.     Left Ear: Tympanic membrane, ear canal and external ear normal.     Mouth/Throat:     Pharynx: No oropharyngeal exudate or posterior oropharyngeal erythema.  Eyes:     Conjunctiva/sclera: Conjunctivae normal.     Pupils: Pupils are equal, round, and reactive to light.  Cardiovascular:     Rate and Rhythm: Normal rate and regular rhythm.     Pulses: Normal pulses.     Heart sounds: No murmur heard.  No gallop.   Pulmonary:     Effort: Pulmonary effort is normal.     Breath sounds: Normal breath sounds. No wheezing or rales.  Abdominal:     Palpations: Abdomen is soft.     Tenderness: There is no abdominal tenderness.  Musculoskeletal:     Cervical back: Neck supple.      Right lower leg: No edema.     Left lower leg: No edema.  Lymphadenopathy:     Cervical: No cervical adenopathy.  Skin:    General: Skin is warm.     Findings: No rash.  Neurological:     General: No focal deficit present.     Mental Status: He is alert and oriented to person, place, and time.  Psychiatric:        Mood and Affect: Mood normal.        Behavior: Behavior normal.            Assessment & Plan:

## 2020-02-25 NOTE — Assessment & Plan Note (Signed)
Healthy Discussed fitness COVID booster Colon due 2028 Recent PSA

## 2020-02-25 NOTE — Assessment & Plan Note (Signed)
No angina Will take away the nitro---needs action if chest pain recurs Cholesterol med, metoprolol and asa

## 2020-02-25 NOTE — Assessment & Plan Note (Signed)
BP Readings from Last 3 Encounters:  02/25/20 112/72  11/26/19 114/66  06/01/19 138/82   Good control

## 2020-03-31 ENCOUNTER — Telehealth: Payer: Self-pay | Admitting: Cardiovascular Disease

## 2020-03-31 DIAGNOSIS — I2510 Atherosclerotic heart disease of native coronary artery without angina pectoris: Secondary | ICD-10-CM

## 2020-03-31 DIAGNOSIS — Z951 Presence of aortocoronary bypass graft: Secondary | ICD-10-CM

## 2020-03-31 NOTE — Telephone Encounter (Signed)
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 She is requesting a callback to schedule once the order has been placed. Please advise.

## 2020-03-31 NOTE — Telephone Encounter (Signed)
Yes, we can do that, do you want me to place the orders?

## 2020-04-04 ENCOUNTER — Encounter: Payer: Self-pay | Admitting: Cardiovascular Disease

## 2020-04-04 ENCOUNTER — Telehealth: Payer: Self-pay | Admitting: Cardiovascular Disease

## 2020-04-04 ENCOUNTER — Other Ambulatory Visit: Payer: Self-pay

## 2020-04-04 DIAGNOSIS — Z951 Presence of aortocoronary bypass graft: Secondary | ICD-10-CM

## 2020-04-04 NOTE — Telephone Encounter (Signed)
Spoke with patient regarding appointment for ETT scheduled Tuesday  06/27/20 at 3:30 pm---COVID prescreening scheduled Friday 06/23/20 at 3:10pm---will mail information to patient and he voiced his understanding.

## 2020-04-05 ENCOUNTER — Encounter: Payer: Self-pay | Admitting: Cardiovascular Disease

## 2020-04-05 NOTE — Telephone Encounter (Signed)
Spoke with patient regarding ETT appointment scheduled Friday 128/22 at 9:45 am and COVID prescreening scheduled Wednesday 05/24/20 at 3:10 pm.  Will mail information to patient and and it is also on my chart

## 2020-04-05 NOTE — Telephone Encounter (Signed)
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

## 2020-04-05 NOTE — Telephone Encounter (Signed)
Hey---just a FYI---[name] did not want to do this testing in January---I will call him and see if he has changed his mind

## 2020-04-10 ENCOUNTER — Other Ambulatory Visit: Payer: Self-pay | Admitting: Physician Assistant

## 2020-04-18 DIAGNOSIS — Z20822 Contact with and (suspected) exposure to covid-19: Secondary | ICD-10-CM | POA: Diagnosis not present

## 2020-04-24 ENCOUNTER — Other Ambulatory Visit: Payer: BC Managed Care – PPO

## 2020-05-03 ENCOUNTER — Other Ambulatory Visit: Payer: Self-pay

## 2020-05-24 ENCOUNTER — Other Ambulatory Visit (HOSPITAL_COMMUNITY)
Admission: RE | Admit: 2020-05-24 | Discharge: 2020-05-24 | Disposition: A | Discharge status: Home / Self Care | Payer: BC Managed Care – PPO | Source: Ambulatory Visit | Attending: Internal Medicine | Admitting: Internal Medicine

## 2020-05-24 DIAGNOSIS — Z20822 Contact with and (suspected) exposure to covid-19: Secondary | ICD-10-CM | POA: Diagnosis not present

## 2020-05-24 DIAGNOSIS — Z01812 Encounter for preprocedural laboratory examination: Secondary | ICD-10-CM | POA: Diagnosis not present

## 2020-05-25 LAB — SARS CORONAVIRUS 2 (TAT 6-24 HRS): SARS Coronavirus 2: NEGATIVE

## 2020-05-26 ENCOUNTER — Encounter: Payer: Self-pay | Admitting: Cardiovascular Disease

## 2020-05-26 ENCOUNTER — Telehealth (HOSPITAL_COMMUNITY): Payer: Self-pay | Admitting: *Deleted

## 2020-05-26 ENCOUNTER — Ambulatory Visit (HOSPITAL_COMMUNITY)
Admission: RE | Admit: 2020-05-26 | Payer: BC Managed Care – PPO | Source: Ambulatory Visit | Attending: Cardiovascular Disease | Admitting: Cardiovascular Disease

## 2020-05-26 ENCOUNTER — Encounter (HOSPITAL_COMMUNITY): Payer: Self-pay

## 2020-05-26 ENCOUNTER — Other Ambulatory Visit: Payer: Self-pay

## 2020-05-26 DIAGNOSIS — I2102 ST elevation (STEMI) myocardial infarction involving left anterior descending coronary artery: Secondary | ICD-10-CM

## 2020-05-26 DIAGNOSIS — E785 Hyperlipidemia, unspecified: Secondary | ICD-10-CM

## 2020-05-26 DIAGNOSIS — I2510 Atherosclerotic heart disease of native coronary artery without angina pectoris: Secondary | ICD-10-CM

## 2020-05-26 DIAGNOSIS — Z951 Presence of aortocoronary bypass graft: Secondary | ICD-10-CM

## 2020-05-26 NOTE — Telephone Encounter (Signed)
Close encounter 

## 2020-05-30 ENCOUNTER — Ambulatory Visit (HOSPITAL_COMMUNITY)
Admission: RE | Admit: 2020-05-30 | Discharge: 2020-05-30 | Disposition: A | Discharge status: Home / Self Care | Payer: BC Managed Care – PPO | Source: Ambulatory Visit | Attending: Cardiovascular Disease | Admitting: Cardiovascular Disease

## 2020-05-30 ENCOUNTER — Other Ambulatory Visit: Payer: Self-pay

## 2020-05-30 DIAGNOSIS — I2102 ST elevation (STEMI) myocardial infarction involving left anterior descending coronary artery: Secondary | ICD-10-CM | POA: Diagnosis not present

## 2020-05-30 DIAGNOSIS — Z951 Presence of aortocoronary bypass graft: Secondary | ICD-10-CM | POA: Diagnosis not present

## 2020-05-30 DIAGNOSIS — I2510 Atherosclerotic heart disease of native coronary artery without angina pectoris: Secondary | ICD-10-CM | POA: Diagnosis not present

## 2020-05-30 DIAGNOSIS — E785 Hyperlipidemia, unspecified: Secondary | ICD-10-CM

## 2020-05-30 LAB — MYOCARDIAL PERFUSION IMAGING
LV dias vol: 133 mL (ref 62–150)
LV sys vol: 61 mL
Peak HR: 83 {beats}/min
Rest HR: 55 {beats}/min
SDS: 0
SRS: 0
SSS: 0
TID: 1.07

## 2020-05-30 MED ORDER — TECHNETIUM TC 99M TETROFOSMIN IV KIT
10.6900 | PACK | Freq: Once | INTRAVENOUS | Status: DC | PRN
  Filled 2020-05-30: qty 11

## 2020-05-30 MED ORDER — TECHNETIUM TC 99M TETROFOSMIN IV KIT
31.0000 | PACK | Freq: Once | INTRAVENOUS | Status: DC | PRN
  Filled 2020-05-30: qty 31

## 2020-05-30 MED ORDER — REGADENOSON 0.4 MG/5ML IV SOLN
0.4000 mg | Freq: Once | INTRAVENOUS | Status: AC
  Administered 2020-05-30: 0.4 mg via INTRAVENOUS

## 2020-05-30 MED ORDER — TECHNETIUM TC 99M TETROFOSMIN IV KIT
31.0000 | PACK | Freq: Once | INTRAVENOUS | Status: AC | PRN
  Administered 2020-05-30: 31 via INTRAVENOUS
  Filled 2020-05-30: qty 31

## 2020-05-30 MED ORDER — TECHNETIUM TC 99M TETROFOSMIN IV KIT
10.9000 | PACK | Freq: Once | INTRAVENOUS | Status: AC | PRN
  Administered 2020-05-30: 10.9 via INTRAVENOUS
  Filled 2020-05-30: qty 11

## 2020-05-31 ENCOUNTER — Ambulatory Visit (INDEPENDENT_AMBULATORY_CARE_PROVIDER_SITE_OTHER): Payer: BC Managed Care – PPO | Admitting: Cardiovascular Disease

## 2020-05-31 ENCOUNTER — Encounter: Payer: Self-pay | Admitting: Cardiovascular Disease

## 2020-05-31 ENCOUNTER — Other Ambulatory Visit: Payer: Self-pay

## 2020-05-31 VITALS — BP 130/68 | HR 61 | Ht 69.5 in | Wt 231.0 lb

## 2020-05-31 DIAGNOSIS — I10 Essential (primary) hypertension: Secondary | ICD-10-CM

## 2020-05-31 DIAGNOSIS — E785 Hyperlipidemia, unspecified: Secondary | ICD-10-CM

## 2020-05-31 DIAGNOSIS — I2510 Atherosclerotic heart disease of native coronary artery without angina pectoris: Secondary | ICD-10-CM

## 2020-05-31 DIAGNOSIS — I2102 ST elevation (STEMI) myocardial infarction involving left anterior descending coronary artery: Secondary | ICD-10-CM

## 2020-05-31 NOTE — Assessment & Plan Note (Signed)
History of hyperlipidemia intolerant to statin therapy and Repatha.  He currently is on Nexlizet with a lipid profile performed 11/26/2019 revealing total cholesterol 119, LDL of 56 and HDL of 50.

## 2020-05-31 NOTE — Progress Notes (Signed)
05/31/2020 [name]   05-03-1955  [id number]  Primary Physician Venia Carbon, MD Primary Cardiologist: Lorretta Harp MD [name], [name]  HPI:  [name] is a 65 y.o.   mildly overweight married Caucasian male father of 3, grandfather 3 grandchildren who his brother-in-law is also a patient of mine.  He previously saw Dr. [name].  He is transferring to my service.    I last saw him in the office 22/2/21.  He is accompanied by his wife [name]  today.  He works putting up signs.  His risk factors include remote tobacco abuse having quit 14 years ago, family history of heart disease with a mother who had myocardial infarction at age 43, hyperlipidemia and hypertension.  He had a STEMI 04/12/2018 and underwent cardiac catheterization emergently by Dr. Burt Knack revealing true ostial LAD disease not favorable for percutaneous intervention.  He underwent emergency CABG by Dr. Roxy Manns with a LIMA to his LAD and a vein to the diagonal branch.  He is currently asymptomatic and walks 7 miles a day on the weekends.  He does have hyperlipidemia intolerant to statin therapy.  He was also started on Repatha which she could not tolerate as well.  Since I saw him a year ago he was put on Nexlizet and has an excellent lipid profile performed 11/26/2019 with a total cholesterol 119, LDL 56 and HDL of 50.  He is completely asymptomatic.  He does have a right bundle branch block.  He had a Lexiscan Myoview stress test performed yesterday which was low risk and nonischemic.  No outpatient medications have been marked as taking for the 05/31/20 encounter (Office Visit) with Lorretta Harp, MD.     Allergies  Allergen Reactions  . Atorvastatin     REACTION: muscle aches  . Repatha  [Evolocumab]   . Simvastatin     REACTION: hand pain  . Pravachol [Pravastatin Sodium]     Muscle aching    Social History   Socioeconomic History  . Marital status: Married    Spouse name: Designer, fashion/clothing  .  Number of children: 4  . Years of education: Not on file  . Highest education level: High school graduate  Occupational History  . Occupation: installs signs    Employer: [employer]  Tobacco Use  . Smoking status: Former Smoker    Years: 35.00    Types: Cigarettes    Quit date: 10/27/2005    Years since quitting: 14.6  . Smokeless tobacco: Never Used  Vaping Use  . Vaping Use: Never used  Substance and Sexual Activity  . Alcohol use: Yes    Comment: once a month-beer   . Drug use: No  . Sexual activity: Not on file  Other Topics Concern  . Not on file  Social History Narrative  . Not on file   Social Determinants of Health   Financial Resource Strain: Not on file  Food Insecurity: Not on file  Transportation Needs: Not on file  Physical Activity: Not on file  Stress: Not on file  Social Connections: Not on file  Intimate Partner Violence: Not on file     Review of Systems: General: negative for chills, fever, night sweats or weight changes.  Cardiovascular: negative for chest pain, dyspnea on exertion, edema, orthopnea, palpitations, paroxysmal nocturnal dyspnea or shortness of breath Dermatological: negative for rash Respiratory: negative for cough or wheezing Urologic: negative for hematuria Abdominal: negative for nausea, vomiting, diarrhea, bright red blood  per rectum, melena, or hematemesis Neurologic: negative for visual changes, syncope, or dizziness All other systems reviewed and are otherwise negative except as noted above.    Blood pressure 130/68, pulse 61, height 5' 9.5" (1.765 m), weight 231 lb (104.8 kg).  General appearance: alert and no distress Neck: no adenopathy, no carotid bruit, no JVD, supple, symmetrical, trachea midline and thyroid not enlarged, symmetric, no tenderness/mass/nodules Lungs: clear to auscultation bilaterally Heart: regular rate and rhythm, S1, S2 normal, no murmur, click, rub or gallop Extremities: extremities normal,  atraumatic, no cyanosis or edema Pulses: 2+ and symmetric Skin: Skin color, texture, turgor normal. No rashes or lesions Neurologic: Alert and oriented X 3, normal strength and tone. Normal symmetric reflexes. Normal coordination and gait  EKG sinus rhythm at 61 with right bundle branch block and left axis deviation.  I personally reviewed this EKG.  ASSESSMENT AND PLAN:   Hyperlipemia History of hyperlipidemia intolerant to statin therapy and Repatha.  He currently is on Nexlizet with a lipid profile performed 11/26/2019 revealing total cholesterol 119, LDL of 56 and HDL of 50.  Essential hypertension, benign History of essential hypertension a blood pressure measured today 130/68.  He is on metoprolol.  CAD (coronary artery disease) History of CAD status post anterior STEMI 04/12/2018.  Underwent urgent cath by Dr. Burt Knack revealing a true ostial LAD lesion not favorable for percutaneous intervention.  He underwent emergency CABG by Dr. Roxy Manns  with a LIMA to his LAD and a vein to a diagonal branch.  His EF was normal.  He is asymptomatic.  He had a Lexiscan Myoview stress test performed yesterday that was low risk and nonischemic.      Lorretta Harp MD FACP,FACC,FAHA, [facility] 05/31/2020 10:17 AM

## 2020-05-31 NOTE — Patient Instructions (Signed)

## 2020-05-31 NOTE — Assessment & Plan Note (Signed)
History of essential hypertension a blood pressure measured today 130/68.  He is on metoprolol.

## 2020-05-31 NOTE — Assessment & Plan Note (Signed)
History of CAD status post anterior STEMI 04/12/2018.  Underwent urgent cath by Dr. Burt Knack revealing a true ostial LAD lesion not favorable for percutaneous intervention.  He underwent emergency CABG by Dr. Roxy Manns  with a LIMA to his LAD and a vein to a diagonal branch.  His EF was normal.  He is asymptomatic.  He had a Lexiscan Myoview stress test performed yesterday that was low risk and nonischemic.

## 2020-06-23 ENCOUNTER — Other Ambulatory Visit (HOSPITAL_COMMUNITY): Payer: BC Managed Care – PPO

## 2020-06-27 ENCOUNTER — Encounter (HOSPITAL_COMMUNITY): Payer: BC Managed Care – PPO

## 2020-07-06 ENCOUNTER — Other Ambulatory Visit: Payer: Self-pay | Admitting: Cardiovascular Disease

## 2020-07-16 ENCOUNTER — Other Ambulatory Visit: Payer: Self-pay | Admitting: Cardiovascular Disease

## 2020-07-16 ENCOUNTER — Encounter (HOSPITAL_COMMUNITY): Payer: Self-pay | Admitting: Emergency Medicine

## 2020-07-16 ENCOUNTER — Ambulatory Visit (HOSPITAL_COMMUNITY)
Admission: EM | Admit: 2020-07-16 | Discharge: 2020-07-16 | Disposition: A | Discharge status: Home / Self Care | Payer: BC Managed Care – PPO | Attending: Emergency Medicine | Admitting: Emergency Medicine

## 2020-07-16 ENCOUNTER — Other Ambulatory Visit: Payer: Self-pay

## 2020-07-16 DIAGNOSIS — B029 Zoster without complications: Secondary | ICD-10-CM

## 2020-07-16 MED ORDER — MUPIROCIN 2 % EX OINT: 1.0000 "application " | TOPICAL_OINTMENT | Freq: Two times a day (BID) | CUTANEOUS | 0 refills | Status: DC

## 2020-07-16 MED ORDER — TETRACAINE HCL 0.5 % OP SOLN
1.0000 [drp] | Freq: Once | OPHTHALMIC | Status: AC
  Administered 2020-07-16: 1 [drp] via OPHTHALMIC

## 2020-07-16 MED ORDER — FLUORESCEIN SODIUM 1 MG OP STRP
ORAL_STRIP | OPHTHALMIC | Status: AC
  Filled 2020-07-16: qty 1

## 2020-07-16 MED ORDER — TETRACAINE HCL 0.5 % OP SOLN
OPHTHALMIC | Status: AC
  Filled 2020-07-16: qty 4

## 2020-07-16 MED ORDER — VALACYCLOVIR HCL 1 G PO TABS: 1000.0000 mg | ORAL_TABLET | Freq: Three times a day (TID) | ORAL | 0 refills | Status: AC

## 2020-07-16 MED ORDER — FLUORESCEIN SODIUM 1 MG OP STRP
1.0000 | ORAL_STRIP | Freq: Once | OPHTHALMIC | Status: AC
  Administered 2020-07-16: 1 via OPHTHALMIC

## 2020-07-16 NOTE — ED Triage Notes (Signed)
Right ear pain/headache since Wednesday.  Noticed rash/bumps behind right ear on Thursday.  Right side of head hurts like a toothache.

## 2020-07-16 NOTE — Discharge Instructions (Addendum)
The Valtrex will shorten the duration and intensity of the symptoms.  The Bactroban will help prevent infection once the blisters rupture.  Do not be around pregnant or immunocompromised individuals until this has healed.  You may take 1000 mg of Tylenol 3-4 times a day as needed for pain.

## 2020-07-16 NOTE — ED Provider Notes (Signed)
HPI  SUBJECTIVE:  [name] is a 65 y.o. male who presents with 5 days of right ear, facial, dental pain in the V1 distribution.  He states he has a throbbing right-sided headache.  He reports itching, burning pain with palpation, and of vesicular rash starting 3 days ago.  No eye pain, visual changes, blurry vision, visual loss, fevers, body aches.  He is not sure if he has had any crusting along the area of the rash, but states that it feels "raw.'  He denies rash elsewhere.  He has tried Tylenol without improvement in symptoms.  Symptoms are worse with scratching/showering.  He has a past medical history of coronary disease, hypertension, hypercholesterolemia, STEMI status post CABG x2 and is on Plavix, prediabetes, varicella.  He did not get the shingles vaccine.  PMD: Venia Carbon, MD   Past Medical History:  Diagnosis Date  . CAD (coronary artery disease) 7/07   Cath for unstable angina--- 50-70% LAD, 40% circ  . ED (erectile dysfunction)   . GERD (gastroesophageal reflux disease)   . Hx of colonic polyps   . Hyperlipidemia   . Hypertension   . Impaired fasting glucose   . 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 Sleep disorder   . STEMI involving left anterior descending coronary artery ([place]) 04/12/2018  . Wrist fracture     6 foot fall onto head, concussion, L wrist (comminuted with intra-articular extension) and 2nd finger (middle phalanx) fxs, vertigo    Past Surgical History:  Procedure Laterality Date  . BRANCHIAL CLEFT CYST EXCISION    . CARDIAC CATHETERIZATION    . COLONOSCOPY  04/03/2007  . CORONARY ARTERY BYPASS GRAFT N/A 04/12/2018   Procedure: CORONARY ARTERY BYPASS GRAFTING (CABG) x two , using left internal mammary artery and right leg greater saphenous vein harvested endoscopically;  Surgeon: Rexene Alberts, MD;  Location: [place];  Service: Open Heart Surgery;  Laterality: N/A;  . CORONARY/GRAFT ACUTE MI  REVASCULARIZATION N/A 04/12/2018   Procedure: Coronary/Graft Acute MI Revascularization;  Surgeon: Sherren Mocha, MD;  Location: [facility] [facility];  Service: Cardiovascular;  Laterality: N/A;  . HEMORRHOID SURGERY    . LEFT HEART CATH AND CORONARY ANGIOGRAPHY N/A 04/12/2018   Procedure: LEFT HEART CATH AND CORONARY ANGIOGRAPHY;  Surgeon: Sherren Mocha, MD;  Location: [place] [facility];  Service: Cardiovascular;  Laterality: N/A;  . RHINOPLASTY    . TEE WITHOUT CARDIOVERSION N/A 04/12/2018   Procedure: TRANSESOPHAGEAL ECHOCARDIOGRAM (TEE);  Surgeon: Rexene Alberts, MD;  Location: [place];  Service: Open Heart Surgery;  Laterality: N/A;    Family History  Problem Relation Age of Onset  . Coronary artery disease Mother   . Stroke Mother   . Coronary artery disease Brother   . Cancer Brother        colon or rectal  . Colon cancer Brother 60       half-brother   . Diabetes Neg Hx   . Stomach cancer Neg Hx     Social History   Tobacco Use  . Smoking status: Former Smoker    Years: 35.00    Types: Cigarettes    Quit date: 10/27/2005    Years since quitting: 14.7  . Smokeless tobacco: Never Used  Vaping Use  . Vaping Use: Never used  Substance Use Topics  . Alcohol use: Yes    Comment: once a month-beer   . Drug  use: No    No current facility-administered medications for this encounter.  Current Outpatient Medications:  .  aspirin EC 81 MG tablet, Take 81 mg by mouth daily., Disp: , Rfl:  .  fluticasone (FLONASE) 50 MCG/ACT nasal spray, Place 2 sprays into both nostrils daily., Disp: 48 g, Rfl: 3 .  metoprolol tartrate (LOPRESSOR) 25 MG tablet, TAKE 0.5 TABLET (12.5 MG TOTAL) BY MOUTH 2 (TWO) TIMES DAILY, Disp: 90 tablet, Rfl: 0 .  Multiple Vitamin (MULTIVITAMIN) tablet, Take 1 tablet by mouth daily., Disp: , Rfl:  .  mupirocin ointment (BACTROBAN) 2 %, Apply 1 application topically 2 (two) times daily., Disp: 22 g, Rfl: 0 .  NEXLIZET 180-10 MG TABS, TAKE 1 TABLET BY MOUTH  EVERY DAY. MANUAL PA SENT 07/15/19, Disp: 30 tablet, Rfl: 23 .  pantoprazole (PROTONIX) 40 MG tablet, TAKE 1 TABLET BY MOUTH EVERY DAY, Disp: 90 tablet, Rfl: 3 .  traZODone (DESYREL) 50 MG tablet, TAKE 1-2 TABLETS BY MOUTH AT BEDTIME., Disp: 180 tablet, Rfl: 2 .  valACYclovir (VALTREX) 1000 MG tablet, Take 1 tablet (1,000 mg total) by mouth 3 (three) times daily for 7 days., Disp: 21 tablet, Rfl: 0  Allergies  Allergen Reactions  . Atorvastatin     REACTION: muscle aches  . Repatha  [Evolocumab]   . Simvastatin     REACTION: hand pain  . Pravachol [Pravastatin Sodium]     Muscle aching     ROS  As noted in HPI.   Physical Exam  BP 137/68 (BP Location: Right Arm)   Pulse 61   Temp (!) 97.3 F (36.3 C) (Oral)   Resp 18   SpO2 97%   Constitutional: Well developed, well nourished, no acute distress Eyes:  EOMI, conjunctiva normal bilaterally PERRLA, no direct or consensual photophobia.  No periorbital rash.  No dendrites seen on fluorescein exam. HENT: Normocephalic, atraumatic,mucus membranes moist.  Tender vesicular rash posterior right ear.  No other rash over the face. right EAC normal.  Nose normal.       Lymph: Tender right-sided posterior cervical lymphadenopathy.  Respiratory: Normal inspiratory effort Cardiovascular: Normal rate GI: nondistended skin: See ENT exam Musculoskeletal: no deformities Neurologic: Alert & oriented x 3, no focal neuro deficits Psychiatric: Speech and behavior appropriate   ED Course   Medications  tetracaine (PONTOCAINE) 0.5 % ophthalmic solution 1 drop (1 drop Right Eye Given 07/16/20 1211)  fluorescein ophthalmic strip 1 strip (1 strip Right Eye Given 07/16/20 1212)    No orders of the defined types were placed in this encounter.   No results found for this or any previous visit (from the past 24 hour(s)). No results found.  ED Clinical Impression  1. Herpes zoster without complication      ED  Assessment/Plan  Patient with shingles outbreak.  Does not appear to be any ocular involvement at this time.  Home with Valtrex, Bactroban, Tylenol 1000 mg 3-4 times a day.  Patient declined prescription of Norco.  Advised him to not be around any pregnant or other immunocompromised people until this heals.  Follow-up with PMD as needed.  To the ER if he gets worse, rash dissemination, or for eye involvement  Discussed  MDM, treatment plan, and plan for follow-up with patient. Discussed sn/sx that should prompt return to the ED. patient agrees with plan.   Meds ordered this encounter  Medications  . tetracaine (PONTOCAINE) 0.5 % ophthalmic solution 1 drop  . fluorescein ophthalmic strip 1 strip  . valACYclovir (  VALTREX) 1000 MG tablet    Sig: Take 1 tablet (1,000 mg total) by mouth 3 (three) times daily for 7 days.    Dispense:  21 tablet    Refill:  0  . mupirocin ointment (BACTROBAN) 2 %    Sig: Apply 1 application topically 2 (two) times daily.    Dispense:  22 g    Refill:  0    *This clinic note was created using [employer]. Therefore, there may be occasional mistakes despite careful proofreading.   ?    Melynda Ripple, MD 07/17/20 [phone]

## 2020-07-17 MED ORDER — TRAZODONE HCL 50 MG PO TABS: 50.0000 mg | ORAL_TABLET | Freq: Every day | ORAL | 2 refills | Status: DC

## 2020-07-17 NOTE — Telephone Encounter (Signed)
Spoke to pt's wife. He went to urgent care yesterday and put on Valtrex. He needed a refill of trazodone so I sent that to [facility]. His work told him he could not come in with Shingles. I advised her that as long as he is not scratching the lesions, doesn't wash his hands, and starts touching everything or someone comes up touching his lesions he would not spread it. I explained that he would not give someone Shingles, he could give someone who has never had Chicken Pox, Chicken Pox. But, that is only if they come in contact with open lesions.

## 2020-07-18 ENCOUNTER — Telehealth: Payer: Self-pay | Admitting: *Deleted

## 2020-07-18 NOTE — Telephone Encounter (Signed)
I was able to view the ER note and some pictures. Okay to write OOW note as requested

## 2020-07-18 NOTE — Telephone Encounter (Signed)
Pt's wife called Triage asking for a work note from PCP. Pt went to UC on 07/16/20 and was diagnoses with shingles. Wife said pt needs a work note and can't get one from [place]. Wife is requesting PCP write a work note requesting pt to be out of work from this Monday 07/17/20 through Friday 07/21/20 and return back to work on next Monday 07/24/20, wife said we can call her back with questions or to let her know note is ready for pick up

## 2020-07-18 NOTE — Telephone Encounter (Signed)
Left message on VM for wife that a letter was created and made available on his MyChart account. We can print it out if needed.

## 2020-07-19 DIAGNOSIS — H16201 Unspecified keratoconjunctivitis, right eye: Secondary | ICD-10-CM | POA: Diagnosis not present

## 2020-08-27 ENCOUNTER — Encounter (HOSPITAL_COMMUNITY): Payer: Self-pay

## 2020-08-27 ENCOUNTER — Ambulatory Visit (INDEPENDENT_AMBULATORY_CARE_PROVIDER_SITE_OTHER): Payer: BC Managed Care – PPO

## 2020-08-27 ENCOUNTER — Ambulatory Visit (HOSPITAL_COMMUNITY)
Admission: EM | Admit: 2020-08-27 | Discharge: 2020-08-27 | Disposition: A | Discharge status: Home / Self Care | Payer: BC Managed Care – PPO | Attending: Urgent Care | Admitting: Urgent Care

## 2020-08-27 ENCOUNTER — Other Ambulatory Visit: Payer: Self-pay

## 2020-08-27 DIAGNOSIS — R0981 Nasal congestion: Secondary | ICD-10-CM

## 2020-08-27 DIAGNOSIS — R079 Chest pain, unspecified: Secondary | ICD-10-CM | POA: Diagnosis not present

## 2020-08-27 DIAGNOSIS — J3089 Other allergic rhinitis: Secondary | ICD-10-CM

## 2020-08-27 DIAGNOSIS — R059 Cough, unspecified: Secondary | ICD-10-CM | POA: Diagnosis not present

## 2020-08-27 DIAGNOSIS — J069 Acute upper respiratory infection, unspecified: Secondary | ICD-10-CM

## 2020-08-27 MED ORDER — PROMETHAZINE-DM 6.25-15 MG/5ML PO SYRP: 5.0000 mL | ORAL_SOLUTION | Freq: Every evening | ORAL | 0 refills | Status: DC | PRN

## 2020-08-27 MED ORDER — PREDNISONE 10 MG PO TABS: 30.0000 mg | ORAL_TABLET | Freq: Every day | ORAL | 0 refills | Status: DC

## 2020-08-27 MED ORDER — LEVOCETIRIZINE DIHYDROCHLORIDE 5 MG PO TABS: 5.0000 mg | ORAL_TABLET | Freq: Every evening | ORAL | 0 refills | Status: DC

## 2020-08-27 NOTE — ED Provider Notes (Signed)
[place]   MRN: [id number] DOB: June 14, 1955  Subjective:   [name] is a 65 y.o. male presenting for 5 day history of persistent and worsening malaise and fatigue with a productive cough that is eliciting chest pain.  Patient states that he took a COVID test yesterday and was negative, does not want one in clinic today.  He does have a history of CAD, hypertension but no respiratory disorders.  Patient is not a smoker.  Denies active shortness of breath, history of pulmonary embolism.  He does have a history of allergic rhinitis but has not been using his allergy medications.  No current facility-administered medications for this encounter.  Current Outpatient Medications:  .  aspirin EC 81 MG tablet, Take 81 mg by mouth daily., Disp: , Rfl:  .  fluticasone (FLONASE) 50 MCG/ACT nasal spray, Place 2 sprays into both nostrils daily., Disp: 48 g, Rfl: 3 .  metoprolol tartrate (LOPRESSOR) 25 MG tablet, TAKE 1/2 TABLET BY MOUTH TWICE A DAY, Disp: 90 tablet, Rfl: 3 .  Multiple Vitamin (MULTIVITAMIN) tablet, Take 1 tablet by mouth daily., Disp: , Rfl:  .  mupirocin ointment (BACTROBAN) 2 %, Apply 1 application topically 2 (two) times daily., Disp: 22 g, Rfl: 0 .  NEXLIZET 180-10 MG TABS, TAKE 1 TABLET BY MOUTH EVERY DAY. MANUAL PA SENT 07/15/19, Disp: 30 tablet, Rfl: 23 .  pantoprazole (PROTONIX) 40 MG tablet, TAKE 1 TABLET BY MOUTH EVERY DAY, Disp: 90 tablet, Rfl: 3 .  traZODone (DESYREL) 50 MG tablet, Take 1-2 tablets (50-100 mg total) by mouth at bedtime., Disp: 180 tablet, Rfl: 2   Allergies  Allergen Reactions  . Atorvastatin     REACTION: muscle aches  . Repatha  [Evolocumab]   . Simvastatin     REACTION: hand pain  . Pravachol [Pravastatin Sodium]     Muscle aching    Past Medical History:  Diagnosis Date  . CAD (coronary artery disease) 7/07   Cath for unstable angina--- 50-70% LAD, 40% circ  . ED (erectile dysfunction)   . GERD (gastroesophageal reflux  disease)   . Hx of colonic polyps   . Hyperlipidemia   . Hypertension   . Impaired fasting glucose   . 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 Sleep disorder   . STEMI involving left anterior descending coronary artery ([place]) 04/12/2018  . Wrist fracture     6 foot fall onto head, concussion, L wrist (comminuted with intra-articular extension) and 2nd finger (middle phalanx) fxs, vertigo     Past Surgical History:  Procedure Laterality Date  . BRANCHIAL CLEFT CYST EXCISION    . CARDIAC CATHETERIZATION    . COLONOSCOPY  04/03/2007  . CORONARY ARTERY BYPASS GRAFT N/A 04/12/2018   Procedure: CORONARY ARTERY BYPASS GRAFTING (CABG) x two , using left internal mammary artery and right leg greater saphenous vein harvested endoscopically;  Surgeon: Rexene Alberts, MD;  Location: [facility];  Service: Open Heart Surgery;  Laterality: N/A;  . CORONARY/GRAFT ACUTE MI REVASCULARIZATION N/A 04/12/2018   Procedure: Coronary/Graft Acute MI Revascularization;  Surgeon: Sherren Mocha, MD;  Location: [facility];  Service: Cardiovascular;  Laterality: N/A;  . HEMORRHOID SURGERY    . LEFT HEART CATH AND CORONARY ANGIOGRAPHY N/A 04/12/2018   Procedure: LEFT HEART CATH AND CORONARY ANGIOGRAPHY;  Surgeon: Sherren Mocha, MD;  Location: [facility];  Service: Cardiovascular;  Laterality:  N/A;  . RHINOPLASTY    . TEE WITHOUT CARDIOVERSION N/A 04/12/2018   Procedure: TRANSESOPHAGEAL ECHOCARDIOGRAM (TEE);  Surgeon: Rexene Alberts, MD;  Location: [facility];  Service: Open Heart Surgery;  Laterality: N/A;    Family History  Problem Relation Age of Onset  . Coronary artery disease Mother   . Stroke Mother   . Coronary artery disease Brother   . Cancer Brother        colon or rectal  . Colon cancer Brother 17       half-brother   . Diabetes Neg Hx   . Stomach cancer Neg Hx     Social History   Tobacco Use  . Smoking status: Former Smoker     Years: 35.00    Types: Cigarettes    Quit date: 10/27/2005    Years since quitting: 14.8  . Smokeless tobacco: Never Used  Vaping Use  . Vaping Use: Never used  Substance Use Topics  . Alcohol use: Yes    Comment: once a month-beer   . Drug use: No    ROS   Objective:   Vitals: BP (!) 153/72 (BP Location: Right Arm)   Pulse 69   Temp 98.9 F (37.2 C) (Oral)   Resp 16   SpO2 98%   Physical Exam Constitutional:      General: He is not in acute distress.    Appearance: Normal appearance. He is well-developed. He is not ill-appearing, toxic-appearing or diaphoretic.  HENT:     Head: Normocephalic and atraumatic.     Right Ear: External ear normal.     Left Ear: External ear normal.     Nose: Congestion and rhinorrhea present.     Mouth/Throat:     Mouth: Mucous membranes are moist.     Pharynx: Oropharynx is clear.  Eyes:     General: No scleral icterus.    Extraocular Movements: Extraocular movements intact.     Pupils: Pupils are equal, round, and reactive to light.  Cardiovascular:     Rate and Rhythm: Normal rate and regular rhythm.     Heart sounds: Normal heart sounds. No murmur heard. No friction rub. No gallop.   Pulmonary:     Effort: Pulmonary effort is normal. No respiratory distress.     Breath sounds: No stridor. Rhonchi present. No wheezing or rales.  Neurological:     Mental Status: He is alert and oriented to person, place, and time.  Psychiatric:        Mood and Affect: Mood normal.        Behavior: Behavior normal.        Thought Content: Thought content normal.        Judgment: Judgment normal.     DG Chest 2 View  Result Date: 08/27/2020 CLINICAL DATA:  Cough, chest pain EXAM: CHEST - 2 VIEW COMPARISON:  04/15/2018 FINDINGS: No focal consolidation. No pleural effusion or pneumothorax. Heart and mediastinal contours are unremarkable. Prior CABG. No acute osseous abnormality. IMPRESSION: No active cardiopulmonary disease. Electronically Signed    By: Kathreen Devoid   On: 08/27/2020 14:04     Assessment and Plan :   PDMP not reviewed this encounter.  1. Viral URI with cough   2. Sinus congestion   3. Allergic rhinitis due to other allergic trigger, unspecified seasonality     In light of his lung sounds, allergic rhinitis, will use 30mg  prednisone x5 days. Hold off on antibiotics at this point. He declined COVID  19 test. Use supportive care otherwise. Counseled patient on potential for adverse effects with medications prescribed/recommended today, ER and return-to-clinic precautions discussed, patient verbalized understanding.    Jaynee Eagles, PA-C 08/27/20 [id number]

## 2020-08-27 NOTE — ED Triage Notes (Signed)
Pt present cough with drainage down his throat. Symptom started on Wednesday. Pt states he tried OTC medication with no relief.

## 2020-10-24 ENCOUNTER — Telehealth: Payer: Self-pay

## 2020-10-24 NOTE — Telephone Encounter (Signed)
   [facility] Pre-operative Risk Assessment    Patient Name: [name]  DOB: Nov 27, 1955  MRN: [id number]   HEARTCARE STAFF: - Please ensure there is not already an duplicate clearance open for this procedure. - Under Visit Info/Reason for Call, type in Other and utilize the format Clearance MM/DD/YY or Clearance TBD. Do not use dashes or single digits. - If request is for dental extraction, please clarify the # of teeth to be extracted. - If the patient is currently at the dentist's office, call Pre-Op APP to address. If the patient is not currently in the dentist office, please route to the Pre-Op pool  Request for surgical clearance:  What type of surgery is being performed? Right Total Knee Arthroplasty   When is this surgery scheduled? 11/21/2020   What type of clearance is required (medical clearance vs. Pharmacy clearance to hold med vs. Both)? BOTH  Are there any medications that need to be held prior to surgery and how long? Aspirin    Practice name and name of physician performing surgery? [facility]- Dr.Olin    What is the office phone number? [phone]   7.   What is the office fax number? [phone] (ATTN: [name]  8.   Anesthesia type (None, local, MAC, general) ? Spinal    Ena Dawley 10/24/2020, 5:10 PM  _________________________________________________________________   (provider comments below)

## 2020-10-25 NOTE — Telephone Encounter (Signed)
   Name: [name]  DOB: 1955/12/04  MRN: [id number]   Primary Cardiologist: Elouise Munroe, MD  Chart reviewed as part of pre-operative protocol coverage. Patient was contacted 10/25/2020 in reference to pre-operative risk assessment for pending surgery as outlined below.  [name] was last seen on 05/31/20 by Dr. Gwenlyn Found  Since that day, [name] has done well. He had a nuclear stress test 05/30/20 that was nonischemic. He can complete 4.0 METS without angina. Given his hx of CABG, would prefer to continue ASA throughout the perioperative period. However, if needed, may hold for 5-7 days.   Therefore, based on ACC/AHA guidelines, the patient would be at acceptable risk for the planned procedure without further cardiovascular testing.   The patient was advised that if he develops new symptoms prior to surgery to contact our office to arrange for a follow-up visit, and he verbalized understanding.  I will route this recommendation to the requesting party via Epic fax function and remove from pre-op pool. Please call with questions.  Palmer, PA 10/25/2020, 11:33 AM

## 2020-11-01 ENCOUNTER — Encounter: Payer: Self-pay | Admitting: Internal Medicine

## 2020-11-01 ENCOUNTER — Other Ambulatory Visit: Payer: Self-pay

## 2020-11-01 ENCOUNTER — Ambulatory Visit: Payer: BC Managed Care – PPO | Admitting: Internal Medicine

## 2020-11-01 DIAGNOSIS — Z01818 Encounter for other preprocedural examination: Secondary | ICD-10-CM | POA: Diagnosis not present

## 2020-11-01 NOTE — Progress Notes (Signed)
Subjective:    Patient ID: [name], male    DOB: 28-Apr-1956, 65 y.o.   MRN: [id number]  HPI Here for preoperative clearance prior to right TKR This visit occurred during the SARS-CoV-2 public health emergency.  Safety protocols were in place, including screening questions prior to the visit, additional usage of staff PPE, and extensive cleaning of exam room while observing appropriate contact time as indicated for disinfecting solutions.   Has TKR planned on 7/26 Cleared by cardiology--they preferred no stopping of ASA but are okay if needed  No chest pain No SOB No dizziness or syncope Mild right foot swelling Having some right calf pain--seems to be referred from the knee  Using tylenol for pain  Current Outpatient Medications on File Prior to Visit  Medication Sig Dispense Refill   aspirin EC 81 MG tablet Take 81 mg by mouth daily.     fluticasone (FLONASE) 50 MCG/ACT nasal spray Place 2 sprays into both nostrils daily. 48 g 3   levocetirizine (XYZAL) 5 MG tablet Take 1 tablet (5 mg total) by mouth every evening. 30 tablet 0   metoprolol tartrate (LOPRESSOR) 25 MG tablet TAKE 1/2 TABLET BY MOUTH TWICE A DAY 90 tablet 3   Multiple Vitamin (MULTIVITAMIN) tablet Take 1 tablet by mouth daily.     NEXLIZET 180-10 MG TABS TAKE 1 TABLET BY MOUTH EVERY DAY. MANUAL PA SENT 07/15/19 30 tablet 23   pantoprazole (PROTONIX) 40 MG tablet TAKE 1 TABLET BY MOUTH EVERY DAY 90 tablet 3   traZODone (DESYREL) 50 MG tablet Take 1-2 tablets (50-100 mg total) by mouth at bedtime. 180 tablet 2   No current facility-administered medications on file prior to visit.    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   LIMA to LAD, SVG to D1, EVH via right thigh   Sleep disorder    STEMI involving left anterior descending coronary artery ([place]) 04/12/2018   Wrist fracture     6 foot fall onto head, concussion, L wrist (comminuted with intra-articular extension) and 2nd finger (middle phalanx) fxs, vertigo    Past Surgical History:  Procedure Laterality Date   BRANCHIAL CLEFT CYST EXCISION     CARDIAC CATHETERIZATION     COLONOSCOPY  04/03/2007   CORONARY ARTERY BYPASS GRAFT N/A 04/12/2018   Procedure: CORONARY ARTERY BYPASS GRAFTING (CABG) x two , using left internal mammary artery and right leg greater saphenous vein harvested endoscopically;  Surgeon: Rexene Alberts, MD;  Location: [facility];  Service: Open Heart Surgery;  Laterality: N/A;   CORONARY/GRAFT ACUTE MI REVASCULARIZATION N/A 04/12/2018   Procedure: Coronary/Graft Acute MI Revascularization;  Surgeon: Sherren Mocha, MD;  Location: [facility];  Service: Cardiovascular;  Laterality: N/A;   HEMORRHOID SURGERY     LEFT HEART CATH AND CORONARY ANGIOGRAPHY N/A 04/12/2018   Procedure: LEFT HEART CATH AND CORONARY ANGIOGRAPHY;  Surgeon: Sherren Mocha,  MD;  Location: [facility];  Service: Cardiovascular;  Laterality: N/A;   RHINOPLASTY     TEE WITHOUT CARDIOVERSION N/A 04/12/2018   Procedure: TRANSESOPHAGEAL ECHOCARDIOGRAM (TEE);  Surgeon: Rexene Alberts, MD;  Location: [facility];  Service: Open Heart Surgery;  Laterality: N/A;    Family History  Problem Relation Age of Onset   Coronary artery disease Mother    Stroke Mother    Coronary artery disease Brother    Cancer Brother        colon or rectal   Colon cancer Brother 61       half-brother    Diabetes Neg Hx    Stomach cancer Neg Hx     Social History   Socioeconomic History    Marital status: Married    Spouse name: Designer, fashion/clothing   Number of children: 4   Years of education: Not on file   Highest education level: High school graduate  Occupational History   Occupation: installs signs    Employer: [employer]  Tobacco Use   Smoking status: Former    Years: 35.00    Pack years: 0.00    Types: Cigarettes    Quit date: 10/27/2005    Years since quitting: 15.0   Smokeless tobacco: Never  Vaping Use   Vaping Use: Never used  Substance and Sexual Activity   Alcohol use: Yes    Comment: once a month-beer    Drug use: No   Sexual activity: Not on file  Other Topics Concern   Not on file  Social History Narrative   Not on file   Social Determinants of Health   Financial Resource Strain: Not on file  Food Insecurity: Not on file  Transportation Needs: Not on file  Physical Activity: Not on file  Stress: Not on file  Social Connections: Not on file  Intimate Partner Violence: Not on file   Review of Systems No sleep problems Appetite is okay     Objective:   Physical Exam Constitutional:      Appearance: Normal appearance.  Cardiovascular:     Rate and Rhythm: Normal rate and regular rhythm.     Pulses: Normal pulses.     Heart sounds: No murmur heard.   No gallop.  Pulmonary:     Effort: Pulmonary effort is normal.     Breath sounds: Normal breath sounds. No wheezing or rales.  Musculoskeletal:     Cervical back: Neck supple.     Right lower leg: No edema.     Left lower leg: No edema.     Comments: No calf tenderness  Lymphadenopathy:     Cervical: No cervical adenopathy.  Neurological:     Mental Status: He is alert.  Psychiatric:        Mood and Affect: Mood normal.        Behavior: Behavior normal.           Assessment & Plan:

## 2020-11-01 NOTE — Assessment & Plan Note (Signed)
No concerning symptoms Had benign stress test in February Exam benign  Okay to proceed with surgery Cardiology prefers not to stop aspirin but will allow it Should hold the ASA after for 2 weeks while on usual post op DVT prophylaxis (xarelto, lovenox or eliquis)

## 2020-11-10 ENCOUNTER — Telehealth: Payer: Self-pay | Admitting: Internal Medicine

## 2020-11-10 NOTE — Telephone Encounter (Signed)
Pt wife called to follow up on medical clearance for that was faxed to [facility] said that they haven't received it  Please fax to [phone]  Attn: Tobey Grim

## 2020-11-10 NOTE — Patient Instructions (Addendum)
DUE TO COVID-19 ONLY ONE VISITOR IS ALLOWED TO COME WITH YOU AND STAY IN THE WAITING ROOM ONLY DURING PRE OP AND PROCEDURE DAY OF SURGERY. THE 1 VISITOR  MAY VISIT WITH YOU AFTER SURGERY IN YOUR PRIVATE ROOM DURING VISITING HOURS ONLY!  YOU NEED TO HAVE A COVID 19 TEST ON: 11/17/20 @ 8:30 AM, THIS TEST MUST BE DONE BEFORE SURGERY,  COVID TESTING SITE [place] [id number], IT IS ON THE RIGHT GOING OUT [address] APPROXIMATELY  2 MINUTES PAST [employer] ON THE RIGHT. ONCE YOUR COVID TEST IS COMPLETED,  PLEASE BEGIN THE QUARANTINE INSTRUCTIONS AS OUTLINED IN YOUR HANDOUT.               [facility]   Your procedure is scheduled on: 11/21/20   Report to [facility] Main  Entrance   Report to short stay at : 5:15 AM     Call this number if you have problems the morning of surgery [phone]    Remember: NO SOLID FOOD AFTER MIDNIGHT THE NIGHT PRIOR TO SURGERY. NOTHING BY MOUTH EXCEPT CLEAR LIQUIDS UNTIL : 4:15 AM. PLEASE FINISH ENSURE DRINK PER SURGEON ORDER  WHICH NEEDS TO BE COMPLETED AT: 4:15 AM .   CLEAR LIQUID DIET  Foods Allowed                                                                     Foods Excluded  Coffee and tea, regular and decaf                             liquids that you cannot  Plain Jell-O any favor except red or purple                                           see through such as: Fruit ices (not with fruit pulp)                                     milk, soups, orange juice  Iced Popsicles                                    All solid food Carbonated beverages, regular and diet                                    Cranberry, grape and apple juices Sports drinks like Gatorade Lightly seasoned clear broth or consume(fat free) Sugar, honey syrup  Sample Menu Breakfast                                Lunch  Supper Cranberry juice                    Beef broth                             Chicken broth Jell-O                                     Grape juice                           Apple juice Coffee or tea                        Jell-O                                      Popsicle                                                Coffee or tea                        Coffee or tea  _____________________________________________________________________   BRUSH YOUR TEETH MORNING OF SURGERY AND RINSE YOUR MOUTH OUT, NO CHEWING GUM CANDY OR MINTS.    Take these medicines the morning of surgery with A SIP OF WATER: loratadine,metoprolol,pantoprazole.Use flonase as usual.                               You may not have any metal on your body including hair pins and              piercings  Do not wear jewelry,lotions, powders or perfumes, deodorant             Men may shave face and neck.   Do not bring valuables to the hospital. [place].  Contacts, dentures or bridgework may not be worn into surgery.  Leave suitcase in the car. After surgery it may be brought to your room.     Patients discharged the day of surgery will not be allowed to drive home. IF YOU ARE HAVING SURGERY AND GOING HOME THE SAME DAY, YOU MUST HAVE AN ADULT TO DRIVE YOU HOME AND BE WITH YOU FOR 24 HOURS. YOU MAY GO HOME BY TAXI OR UBER OR ORTHERWISE, BUT AN ADULT MUST ACCOMPANY YOU HOME AND STAY WITH YOU FOR 24 HOURS.  Name and phone number of your driver:  Special Instructions: N/A              Please read over the following fact sheets you were given: _____________________________________________________________________           [facility] - Preparing for Surgery Before surgery, you can play an important role.  Because skin is not sterile, your skin needs to be as free of germs as possible.  You can reduce the number of germs on your skin by washing  with CHG (chlorahexidine gluconate) soap before surgery.  CHG is an antiseptic cleaner which kills germs and  bonds with the skin to continue killing germs even after washing. Please DO NOT use if you have an allergy to CHG or antibacterial soaps.  If your skin becomes reddened/irritated stop using the CHG and inform your nurse when you arrive at Short Stay. Do not shave (including legs and underarms) for at least 48 hours prior to the first CHG shower.  You may shave your face/neck. Please follow these instructions carefully:  1.  Shower with CHG Soap the night before surgery and the  morning of Surgery.  2.  If you choose to wash your hair, wash your hair first as usual with your  normal  shampoo.  3.  After you shampoo, rinse your hair and body thoroughly to remove the  shampoo.                           4.  Use CHG as you would any other liquid soap.  You can apply chg directly  to the skin and wash                       Gently with a scrungie or clean washcloth.  5.  Apply the CHG Soap to your body ONLY FROM THE NECK DOWN.   Do not use on face/ open                           Wound or open sores. Avoid contact with eyes, ears mouth and genitals (private parts).                       Wash face,  Genitals (private parts) with your normal soap.             6.  Wash thoroughly, paying special attention to the area where your surgery  will be performed.  7.  Thoroughly rinse your body with warm water from the neck down.  8.  DO NOT shower/wash with your normal soap after using and rinsing off  the CHG Soap.                9.  Pat yourself dry with a clean towel.            10.  Wear clean pajamas.            11.  Place clean sheets on your bed the night of your first shower and do not  sleep with pets. Day of Surgery : Do not apply any lotions/deodorants the morning of surgery.  Please wear clean clothes to the hospital/surgery center.  FAILURE TO FOLLOW THESE INSTRUCTIONS MAY RESULT IN THE CANCELLATION OF YOUR SURGERY PATIENT SIGNATURE_________________________________  NURSE  SIGNATURE__________________________________  ________________________________________________________________________   [name]  An incentive spirometer is a tool that can help keep your lungs clear and active. This tool measures how well you are filling your lungs with each breath. Taking long deep breaths may help reverse or decrease the chance of developing breathing (pulmonary) problems (especially infection) following: A long period of time when you are unable to move or be active. BEFORE THE PROCEDURE  If the spirometer includes an indicator to show your best effort, your nurse or respiratory therapist will set it to a desired goal. If possible, sit up straight or lean slightly  forward. Try not to slouch. Hold the incentive spirometer in an upright position. INSTRUCTIONS FOR USE  Sit on the edge of your bed if possible, or sit up as far as you can in bed or on a chair. Hold the incentive spirometer in an upright position. Breathe out normally. Place the mouthpiece in your mouth and seal your lips tightly around it. Breathe in slowly and as deeply as possible, raising the piston or the ball toward the top of the column. Hold your breath for 3-5 seconds or for as long as possible. Allow the piston or ball to fall to the bottom of the column. Remove the mouthpiece from your mouth and breathe out normally. Rest for a few seconds and repeat Steps 1 through 7 at least 10 times every 1-2 hours when you are awake. Take your time and take a few normal breaths between deep breaths. The spirometer may include an indicator to show your best effort. Use the indicator as a goal to work toward during each repetition. After each set of 10 deep breaths, practice coughing to be sure your lungs are clear. If you have an incision (the cut made at the time of surgery), support your incision when coughing by placing a pillow or rolled up towels firmly against it. Once you are able to get out of  bed, walk around indoors and cough well. You may stop using the incentive spirometer when instructed by your caregiver.  RISKS AND COMPLICATIONS Take your time so you do not get dizzy or light-headed. If you are in pain, you may need to take or ask for pain medication before doing incentive spirometry. It is harder to take a deep breath if you are having pain. AFTER USE Rest and breathe slowly and easily. It can be helpful to keep track of a log of your progress. Your caregiver can provide you with a simple table to help with this. If you are using the spirometer at home, follow these instructions: [place] IF:  You are having difficultly using the spirometer. You have trouble using the spirometer as often as instructed. Your pain medication is not giving enough relief while using the spirometer. You develop fever of 100.5 F (38.1 C) or higher. SEEK IMMEDIATE MEDICAL CARE IF:  You cough up bloody sputum that had not been present before. You develop fever of 102 F (38.9 C) or greater. You develop worsening pain at or near the incision site. MAKE SURE YOU:  Understand these instructions. Will watch your condition. Will get help right away if you are not doing well or get worse. Document Released: 08/26/2006 Document Revised: 07/08/2011 Document Reviewed: 10/27/2006 [facility] Patient Information 2014 [place], [place].   ________________________________________________________________________

## 2020-11-13 ENCOUNTER — Other Ambulatory Visit: Payer: Self-pay

## 2020-11-13 ENCOUNTER — Encounter (HOSPITAL_COMMUNITY): Payer: Self-pay

## 2020-11-13 ENCOUNTER — Encounter (HOSPITAL_COMMUNITY)
Admission: RE | Admit: 2020-11-13 | Discharge: 2020-11-13 | Disposition: A | Discharge status: Home / Self Care | Payer: Worker's Compensation | Source: Ambulatory Visit | Attending: Orthopedic Surgery | Admitting: Orthopedic Surgery

## 2020-11-13 DIAGNOSIS — Z01812 Encounter for preprocedural laboratory examination: Secondary | ICD-10-CM | POA: Insufficient documentation

## 2020-11-13 HISTORY — DX: Unspecified osteoarthritis, unspecified site: M19.90

## 2020-11-13 HISTORY — DX: Prediabetes: R73.03

## 2020-11-13 LAB — COMPREHENSIVE METABOLIC PANEL
ALT: 29 U/L (ref 0–44)
AST: 33 U/L (ref 15–41)
Albumin: 4.8 g/dL (ref 3.5–5.0)
Alkaline Phosphatase: 87 U/L (ref 38–126)
Anion gap: 9 (ref 5–15)
BUN: 23 mg/dL (ref 8–23)
CO2: 27 mmol/L (ref 22–32)
Calcium: 9.8 mg/dL (ref 8.9–10.3)
Chloride: 104 mmol/L (ref 98–111)
Creatinine, Ser: 1.16 mg/dL (ref 0.61–1.24)
GFR, Estimated: >60 mL/min (ref >60)
Glucose, Bld: 150 mg/dL — ABNORMAL HIGH (ref 70–99)
Potassium: 4.9 mmol/L (ref 3.5–5.1)
Sodium: 140 mmol/L (ref 135–145)
Total Bilirubin: 0.7 mg/dL (ref 0.3–1.2)
Total Protein: 8 g/dL (ref 6.5–8.1)

## 2020-11-13 LAB — CBC
HCT: 49.6 % (ref 39.0–52.0)
Hemoglobin: 16.9 g/dL (ref 13.0–17.0)
MCH: 30.9 pg (ref 26.0–34.0)
MCHC: 34.1 g/dL (ref 30.0–36.0)
MCV: 90.7 fL (ref 80.0–100.0)
Platelets: 299 10*3/uL (ref 150–400)
RBC: 5.47 MIL/uL (ref 4.22–5.81)
RDW: 12.7 % (ref 11.5–15.5)
WBC: 9.3 10*3/uL (ref 4.0–10.5)
nRBC: 0 % (ref 0.0–0.2)

## 2020-11-13 LAB — HEMOGLOBIN A1C
Hgb A1c MFr Bld: 6.1 % — ABNORMAL HIGH (ref 4.8–5.6)
Mean Plasma Glucose: 128.37 mg/dL

## 2020-11-13 LAB — SURGICAL PCR SCREEN
MRSA, PCR: NEGATIVE
Staphylococcus aureus: NEGATIVE

## 2020-11-13 NOTE — Progress Notes (Signed)
COVID Vaccine Completed: Yes Date COVID Vaccine completed: 03/04/20 [place] COVID vaccine manufacturer: Pfizer      PCP - Dr. Viviana Simpler. Clearance: 11/01/20: EPIC Cardiologist - Dr. Cherlynn Kaiser  Chest x-ray - 08/27/20 EKG - 05/31/20 EPIC Stress Test -  ECHO - 04/12/18 Cardiac Cath -  Pacemaker/ICD device last checked:  Sleep Study -  CPAP -   Fasting Blood Sugar -  Checks Blood Sugar _____ times a day  Blood Thinner Instructions: Aspirin Instructions: Last Dose:  Anesthesia review: Hx: HTN,CABG<CAD,pre-DIA.  Patient denies shortness of breath, fever, cough and chest pain at PAT appointment   Patient verbalized understanding of instructions that were given to them at the PAT appointment. Patient was also instructed that they will need to review over the PAT instructions again at home before surgery.

## 2020-11-14 NOTE — Progress Notes (Signed)
Anesthesia Chart Review   Case: [id number] Date/Time: 11/21/20 0700   Procedure: TOTAL KNEE ARTHROPLASTY (Right: Knee)   Anesthesia type: Spinal   Pre-op diagnosis: Right knee osteoarthritis   Location: [facility] ROOM 09 / [facility]   Surgeons: Paralee Cancel, MD       DISCUSSION:65 y.o. former smoker with h/o HTN, GERD, CAD (CABG), right knee OA scheduled for above procedure 11/21/2020 with Dr. Paralee Cancel.   Pt last seen by PCP 11/01/2020. Per OV note ok to proceed with surgery.   Per cardiology preoperative evaluation 10/25/2020, "Chart reviewed as part of pre-operative protocol coverage. Patient was contacted 10/25/2020 in reference to pre-operative risk assessment for pending surgery as outlined below.  Farris Geiman was last seen on 05/31/20 by Dr. Gwenlyn Found  Since that day, Meldrick Buttery has done well. He had a nuclear stress test 05/30/20 that was nonischemic. He can complete 4.0 METS without angina. Given his hx of CABG, would prefer to continue ASA throughout the perioperative period. However, if needed, may hold for 5-7 days.    Therefore, based on ACC/AHA guidelines, the patient would be at acceptable risk for the planned procedure without further cardiovascular testing."  Anticipate pt can proceed with planned procedure barring acute status change.   VS: BP (!) 159/79   Pulse 77   Temp 36.6 C (Oral)   Ht 5' 10.5" (1.791 m)   Wt 108 kg   SpO2 96%   BMI 33.67 kg/m   PROVIDERS: Venia Carbon, MD is PCP   Elouise Munroe, MD is Cardiologist  LABS: Labs reviewed: Acceptable for surgery. (all labs ordered are listed, but only abnormal results are displayed)  Labs Reviewed  COMPREHENSIVE METABOLIC PANEL - Abnormal; Notable for the following components:      Result Value   Glucose, Bld 150 (*)    All other components within normal limits  HEMOGLOBIN A1C - Abnormal; Notable for the following components:   Hgb A1c MFr Bld 6.1 (*)    All other components within normal limits  SURGICAL PCR  SCREEN  CBC  TYPE AND SCREEN     IMAGES:   EKG: 05/31/20 Rate 61 bpm  Sinus rhythm with 1st degree AV block  LAD RBBB Septal infarct, age undetermined   CV: Stress Test 05/30/20   Nuclear stress EF: 54%. The left ventricular ejection fraction is mildly decreased (45-54%). There was no ST segment deviation noted during stress. Defect 1: There is a small defect of mild severity present in the apex location. This is a low risk study.   No evidence of ischemia. Low normal EF with septal dyskinesis consistent with prior CABG. Mild apical defect noted, most consistent with apical thinning artifact but small infarct cannot be excluded.  Echo 04/12/2018  Aortic valve: The valve is trileaflet. No stenosis. No regurgitation.   Mitral valve: Trace regurgitation.   Right ventricle: Normal cavity size, wall thickness and ejection  fraction.   Tricuspid valve: Trace regurgitation.   Pulmonic valve: Trace regurgitation.  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   LIMA to LAD, SVG to D1, EVH via right  thigh   Sleep disorder    STEMI involving left anterior descending coronary artery ([place]) 04/12/2018   Wrist fracture     6 foot fall onto head, concussion, L wrist (comminuted with intra-articular extension) and 2nd finger (middle phalanx) fxs, vertigo    Past Surgical History:  Procedure Laterality Date   BRANCHIAL CLEFT CYST EXCISION     CARDIAC CATHETERIZATION     COLONOSCOPY  04/03/2007   CORONARY ARTERY BYPASS GRAFT N/A 04/12/2018   Procedure: CORONARY ARTERY BYPASS GRAFTING (CABG) x two , using left internal mammary artery and right leg greater saphenous vein  harvested endoscopically;  Surgeon: Rexene Alberts, MD;  Location: [place];  Service: Open Heart Surgery;  Laterality: N/A;   CORONARY/GRAFT ACUTE MI REVASCULARIZATION N/A 04/12/2018   Procedure: Coronary/Graft Acute MI Revascularization;  Surgeon: Sherren Mocha, MD;  Location: [place] [facility];  Service: Cardiovascular;  Laterality: N/A;   HEMORRHOID SURGERY     LEFT HEART CATH AND CORONARY ANGIOGRAPHY N/A 04/12/2018   Procedure: LEFT HEART CATH AND CORONARY ANGIOGRAPHY;  Surgeon: Sherren Mocha, MD;  Location: [place] [facility];  Service: Cardiovascular;  Laterality: N/A;   RHINOPLASTY     TEE WITHOUT CARDIOVERSION N/A 04/12/2018   Procedure: TRANSESOPHAGEAL ECHOCARDIOGRAM (TEE);  Surgeon: Rexene Alberts, MD;  Location: [place];  Service: Open Heart Surgery;  Laterality: N/A;    MEDICATIONS:  aspirin EC 81 MG tablet   fluticasone (FLONASE) 50 MCG/ACT nasal spray   levocetirizine (XYZAL) 5 MG tablet   loratadine (CLARITIN) 10 MG tablet   metoprolol tartrate (LOPRESSOR) 25 MG tablet   Multiple Vitamin (MULTIVITAMIN) tablet   NEXLIZET 180-10 MG TABS   Omega-3 Fatty Acids (FISH OIL PO)   pantoprazole (PROTONIX) 40 MG tablet   traZODone (DESYREL) 50 MG tablet   No current facility-administered medications for this encounter.    Konrad Felix, PA-C [facility] Pre-Surgical Testing [phone]

## 2020-11-14 NOTE — Anesthesia Preprocedure Evaluation (Addendum)
Anesthesia Evaluation  Patient identified by MRN, date of birth, ID band Patient awake    Reviewed: Allergy & Precautions, NPO status , Patient's Chart, lab work & pertinent test results  Airway Mallampati: II  TM Distance: >3 FB Neck ROM: Full    Dental no notable dental hx.    Pulmonary neg pulmonary ROS, former smoker,    Pulmonary exam normal breath sounds clear to auscultation       Cardiovascular hypertension, + CAD, + Past MI and + CABG  Normal cardiovascular exam Rhythm:Regular Rate:Normal     Neuro/Psych negative neurological ROS  negative psych ROS   GI/Hepatic negative GI ROS, Neg liver ROS,   Endo/Other  negative endocrine ROS  Renal/GU negative Renal ROS  negative genitourinary   Musculoskeletal  (+) Arthritis , Osteoarthritis,    Abdominal   Peds negative pediatric ROS (+)  Hematology negative hematology ROS (+)   Anesthesia Other Findings   Reproductive/Obstetrics negative OB ROS                            Anesthesia Physical Anesthesia Plan  ASA: 3  Anesthesia Plan: Spinal   Post-op Pain Management:  Regional for Post-op pain   Induction: Intravenous  PONV Risk Score and Plan: 2 and Ondansetron, Dexamethasone and Propofol infusion  Airway Management Planned: Simple Face Mask  Additional Equipment:   Intra-op Plan:   Post-operative Plan:   Informed Consent: I have reviewed the patients History and Physical, chart, labs and discussed the procedure including the risks, benefits and alternatives for the proposed anesthesia with the patient or authorized representative who has indicated his/her understanding and acceptance.     Dental advisory given  Plan Discussed with: CRNA and Surgeon  Anesthesia Plan Comments: (See PAT note 11/13/2020, Konrad Felix, PA-C)       Anesthesia Quick Evaluation

## 2020-11-17 ENCOUNTER — Other Ambulatory Visit (HOSPITAL_COMMUNITY)
Admission: RE | Admit: 2020-11-17 | Discharge: 2020-11-17 | Disposition: A | Discharge status: Home / Self Care | Payer: Worker's Compensation | Source: Ambulatory Visit | Attending: Orthopedic Surgery | Admitting: Orthopedic Surgery

## 2020-11-17 DIAGNOSIS — Z01812 Encounter for preprocedural laboratory examination: Secondary | ICD-10-CM | POA: Insufficient documentation

## 2020-11-17 DIAGNOSIS — Z20822 Contact with and (suspected) exposure to covid-19: Secondary | ICD-10-CM | POA: Diagnosis not present

## 2020-11-17 LAB — SARS CORONAVIRUS 2 (TAT 6-24 HRS): SARS Coronavirus 2: NEGATIVE

## 2020-11-18 ENCOUNTER — Other Ambulatory Visit: Payer: Self-pay | Admitting: Internal Medicine

## 2020-11-20 NOTE — H&P (Signed)
TOTAL KNEE ADMISSION H&P  Patient is being admitted for right total knee arthroplasty.  Subjective:  Chief Complaint:right knee pain.  HPI: [name], 65 y.o. male, has a history of pain and functional disability in the right knee due to arthritis and has failed non-surgical conservative treatments for greater than 12 weeks to includeNSAID's and/or analgesics, corticosteriod injections, and activity modification.  Onset of symptoms was gradual, starting 3 years ago with gradually worsening course since that time. The patient noted no past surgery on the right knee(s).  Patient currently rates pain in the right knee(s) at 8 out of 10 with activity. Patient has worsening of pain with activity and weight bearing and pain that interferes with activities of daily living.  Patient has evidence of joint space narrowing by imaging studies. There is no active infection.  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   Sleep disturbance 11/25/2006   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   LIMA to LAD, SVG to D1, EVH via right thigh    Sleep disorder    STEMI involving left anterior descending coronary artery ([place]) 04/12/2018   Wrist fracture     6 foot fall onto head, concussion, L wrist (comminuted with intra-articular extension) and 2nd finger (middle phalanx) fxs, vertigo    Past Surgical History:  Procedure Laterality Date   BRANCHIAL CLEFT CYST EXCISION     CARDIAC CATHETERIZATION     COLONOSCOPY  04/03/2007   CORONARY ARTERY BYPASS GRAFT N/A 04/12/2018   Procedure: CORONARY ARTERY BYPASS GRAFTING (CABG) x two , using left internal mammary artery and right leg greater saphenous vein harvested endoscopically;  Surgeon: Rexene Alberts, MD;  Location: [place];  Service: Open Heart Surgery;  Laterality: N/A;   CORONARY/GRAFT ACUTE MI REVASCULARIZATION N/A 04/12/2018   Procedure: Coronary/Graft Acute MI Revascularization;  Surgeon: Sherren Mocha, MD;  Location: [facility];  Service: Cardiovascular;  Laterality: N/A;   HEMORRHOID SURGERY     LEFT HEART CATH AND CORONARY ANGIOGRAPHY N/A 04/12/2018   Procedure: LEFT HEART CATH AND CORONARY ANGIOGRAPHY;  Surgeon: Sherren Mocha, MD;  Location: [facility];  Service: Cardiovascular;  Laterality: N/A;   RHINOPLASTY     TEE WITHOUT CARDIOVERSION N/A 04/12/2018   Procedure: TRANSESOPHAGEAL ECHOCARDIOGRAM (TEE);  Surgeon: Rexene Alberts, MD;  Location: [facility];  Service: Open Heart Surgery;  Laterality: N/A;    No current facility-administered medications for this encounter.   Current Outpatient Medications  Medication Sig Dispense Refill Last Dose   aspirin EC 81 MG tablet Take 81 mg by mouth  daily.      loratadine (CLARITIN) 10 MG tablet Take 10 mg by mouth daily.      metoprolol tartrate (LOPRESSOR) 25 MG tablet TAKE 1/2 TABLET BY MOUTH TWICE A DAY (Patient taking differently: Take 12.5 mg by mouth 2 (two) times daily.) 90 tablet 3    Multiple Vitamin (MULTIVITAMIN) tablet Take 1 tablet by mouth daily.      NEXLIZET 180-10 MG TABS TAKE 1 TABLET BY MOUTH  EVERY DAY. MANUAL PA SENT 07/15/19 (Patient taking differently: Take 1 tablet by mouth daily.) 30 tablet 23    Omega-3 Fatty Acids (FISH OIL PO) Take 1 capsule by mouth daily.      traZODone (DESYREL) 50 MG tablet Take 1-2 tablets (50-100 mg total) by mouth at bedtime. (Patient taking differently: Take 50 mg by mouth at bedtime as needed for sleep.) 180 tablet 2    fluticasone (FLONASE) 50 MCG/ACT nasal spray SPRAY 2 SPRAYS INTO EACH NOSTRIL EVERY DAY 48 mL 3    levocetirizine (XYZAL) 5 MG tablet Take 1 tablet (5 mg total) by mouth every evening. (Patient not taking: No sig reported) 30 tablet 0 Not Taking   pantoprazole (PROTONIX) 40 MG tablet TAKE 1 TABLET BY MOUTH EVERY DAY 90 tablet 3    Allergies  Allergen Reactions   Atorvastatin     REACTION: muscle aches   Morphine And Related Itching   Repatha  [Evolocumab]     Muscle pain   Simvastatin     REACTION: hand pain   Pravachol [Pravastatin Sodium]     Muscle aching    Social History   Tobacco Use   Smoking status: Former    Years: 35.00    Types: Cigarettes    Quit date: 10/27/2005    Years since quitting: 15.0   Smokeless tobacco: Never  Substance Use Topics   Alcohol use: Yes    Comment: once a month-beer     Family History  Problem Relation Age of Onset   Coronary artery disease Mother    Stroke Mother    Coronary artery disease Brother    Cancer Brother        colon or rectal   Colon cancer Brother 88       half-brother    Diabetes Neg Hx    Stomach cancer Neg Hx      Review of Systems  Constitutional:  Negative for chills and fever.  Respiratory:  Negative for cough and shortness of breath.   Cardiovascular:  Negative for chest pain.  Gastrointestinal:  Negative for nausea and vomiting.  Musculoskeletal:  Positive for arthralgias.   Objective:  Physical Exam Well nourished and well developed. General: Alert and oriented x3, cooperative and pleasant, no acute distress. Head: normocephalic, atraumatic,  neck supple. Eyes: EOMI.  Musculoskeletal: Right knee exam: He walks in the office without assist device but does have a mild limp favoring the right knee No palpable effusion, warmth erythema He has tenderness at the medial aspect of the joint as well as anteriorly He has about a 5 degree flexion contracture and flexes close to 120 degrees with crepitation and tightness over the anterior medial aspect knee Stable medial lateral collateral ligaments With anterior drawer exam is difficult to assess laxity due to some tightness in the knee. Neurovascular intact distally Tightness noted with hip range of motion without reproducible groin pain or referred pain  Calves soft and nontender. Motor function intact in LE. Strength 5/5 LE bilaterally. Neuro: Distal pulses 2+. Sensation  to light touch intact in LE.  Vital signs in last 24 hours:    Labs:   Estimated body mass index is 33.67 kg/m as calculated from the following:   Height as of 11/13/20: 5' 10.5" (1.791 m).   Weight as of 11/13/20: 108 kg.   Imaging Review Plain radiographs demonstrate severe degenerative joint disease of the right knee(s). The overall alignment isneutral. The bone quality appears to be adequate for age and reported activity level.      Assessment/Plan:  End stage arthritis, right knee   The patient history, physical examination, clinical judgment of the provider and imaging studies are consistent with end stage degenerative joint disease of the right knee(s) and total knee arthroplasty is deemed medically necessary. The treatment options including medical management, injection therapy arthroscopy and arthroplasty were discussed at length. The risks and benefits of total knee arthroplasty were presented and reviewed. The risks due to aseptic loosening, infection, stiffness, patella tracking problems, thromboembolic complications and other imponderables were discussed. The patient acknowledged the  explanation, agreed to proceed with the plan and consent was signed. Patient is being admitted for inpatient treatment for surgery, pain control, PT, OT, prophylactic antibiotics, VTE prophylaxis, progressive ambulation and ADL's and discharge planning. The patient is planning to be discharged  home.  Therapy Plans: outpatient therapy at [facility] Disposition: Home with Planned DVT Prophylaxis: aspirin '81mg'$  BID DME needed: walker PCP: Dr. Silvio Pate Cardiologist: Dr. Gwenlyn Found TXA: IV Allergies: statins - finger cramping, morphine - burning Anesthesia Concerns: BMI: 33.9 Last HgbA1c: Not diabetic  Other: - Hx of MI & CABG 2019 - Tylenol, celebrex, oxycodone, robaxin   Patient's anticipated LOS is less than 2 midnights, meeting these requirements: - Younger than 40 - Lives within 1 hour of care - Has a competent adult at home to recover with post-op recover - NO history of  - Chronic pain requiring opiods  - Diabetes  - Coronary Artery Disease  - Heart failure  - Heart attack  - Stroke  - DVT/VTE  - Cardiac arrhythmia  - Respiratory Failure/COPD  - Renal failure  - Anemia  - Advanced Liver disease  Griffith Citron, PA-C Orthopedic Surgery [facility] [place] [phone]

## 2020-11-21 ENCOUNTER — Encounter (HOSPITAL_COMMUNITY): Payer: Self-pay | Admitting: Orthopedic Surgery

## 2020-11-21 ENCOUNTER — Ambulatory Visit (HOSPITAL_COMMUNITY): Payer: Worker's Compensation | Admitting: Certified Registered Nurse Anesthetist

## 2020-11-21 ENCOUNTER — Other Ambulatory Visit: Payer: Self-pay

## 2020-11-21 ENCOUNTER — Encounter (HOSPITAL_COMMUNITY)
Admission: RE | Disposition: A | Discharge status: Home / Self Care | Payer: Self-pay | Source: Home / Self Care | Attending: Orthopedic Surgery

## 2020-11-21 ENCOUNTER — Observation Stay (HOSPITAL_COMMUNITY)
Admission: RE | Admit: 2020-11-21 | Discharge: 2020-11-22 | Disposition: A | Discharge status: Home / Self Care | Payer: Worker's Compensation | Attending: Orthopedic Surgery | Admitting: Orthopedic Surgery

## 2020-11-21 DIAGNOSIS — Z79899 Other long term (current) drug therapy: Secondary | ICD-10-CM | POA: Diagnosis not present

## 2020-11-21 DIAGNOSIS — I2510 Atherosclerotic heart disease of native coronary artery without angina pectoris: Secondary | ICD-10-CM | POA: Diagnosis not present

## 2020-11-21 DIAGNOSIS — Z9861 Coronary angioplasty status: Secondary | ICD-10-CM | POA: Insufficient documentation

## 2020-11-21 DIAGNOSIS — R7303 Prediabetes: Secondary | ICD-10-CM | POA: Insufficient documentation

## 2020-11-21 DIAGNOSIS — Z23 Encounter for immunization: Secondary | ICD-10-CM | POA: Diagnosis not present

## 2020-11-21 DIAGNOSIS — Z87891 Personal history of nicotine dependence: Secondary | ICD-10-CM | POA: Diagnosis not present

## 2020-11-21 DIAGNOSIS — Z951 Presence of aortocoronary bypass graft: Secondary | ICD-10-CM | POA: Diagnosis not present

## 2020-11-21 DIAGNOSIS — Z7982 Long term (current) use of aspirin: Secondary | ICD-10-CM | POA: Insufficient documentation

## 2020-11-21 DIAGNOSIS — Z96651 Presence of right artificial knee joint: Secondary | ICD-10-CM

## 2020-11-21 DIAGNOSIS — I10 Essential (primary) hypertension: Secondary | ICD-10-CM | POA: Diagnosis not present

## 2020-11-21 DIAGNOSIS — M1711 Unilateral primary osteoarthritis, right knee: Secondary | ICD-10-CM | POA: Diagnosis not present

## 2020-11-21 HISTORY — PX: TOTAL KNEE ARTHROPLASTY: SHX125

## 2020-11-21 LAB — TYPE AND SCREEN
ABO/RH(D): O NEG
Antibody Screen: NEGATIVE

## 2020-11-21 SURGERY — ARTHROPLASTY, KNEE, TOTAL
Anesthesia: Spinal | Site: Knee | Laterality: Right

## 2020-11-21 MED ORDER — PHENYLEPHRINE HCL-NACL 10-0.9 MG/250ML-% IV SOLN
INTRAVENOUS | Status: DC | PRN
  Administered 2020-11-21: 25 ug/min via INTRAVENOUS

## 2020-11-21 MED ORDER — FLUTICASONE PROPIONATE 50 MCG/ACT NA SUSP: 2.0000 | Freq: Every day | NASAL | Status: DC

## 2020-11-21 MED ORDER — PHENYLEPHRINE HCL-NACL 10-0.9 MG/250ML-% IV SOLN
INTRAVENOUS | Status: AC
  Filled 2020-11-21: qty 750

## 2020-11-21 MED ORDER — ACETAMINOPHEN 10 MG/ML IV SOLN: 1000.0000 mg | Freq: Once | INTRAVENOUS | Status: DC | PRN

## 2020-11-21 MED ORDER — ONDANSETRON HCL 4 MG/2ML IJ SOLN: 4.0000 mg | Freq: Four times a day (QID) | INTRAMUSCULAR | Status: DC | PRN

## 2020-11-21 MED ORDER — PROPOFOL 500 MG/50ML IV EMUL
INTRAVENOUS | Status: AC
  Filled 2020-11-21: qty 50

## 2020-11-21 MED ORDER — TRANEXAMIC ACID-NACL 1000-0.7 MG/100ML-% IV SOLN
1000.0000 mg | Freq: Once | INTRAVENOUS | Status: AC
  Administered 2020-11-21: 1000 mg via INTRAVENOUS
  Filled 2020-11-21: qty 100

## 2020-11-21 MED ORDER — ACETAMINOPHEN 325 MG PO TABS: 325.0000 mg | ORAL_TABLET | Freq: Four times a day (QID) | ORAL | Status: DC | PRN

## 2020-11-21 MED ORDER — SODIUM CHLORIDE 0.9 % IV SOLN
2.0000 g | INTRAVENOUS | Status: AC
  Administered 2020-11-21: 2 g via INTRAVENOUS
  Filled 2020-11-21: qty 2

## 2020-11-21 MED ORDER — ONDANSETRON HCL 4 MG/2ML IJ SOLN
INTRAMUSCULAR | Status: AC
  Filled 2020-11-21: qty 2

## 2020-11-21 MED ORDER — POLYETHYLENE GLYCOL 3350 17 G PO PACK: 17.0000 g | PACK | Freq: Every day | ORAL | Status: DC | PRN

## 2020-11-21 MED ORDER — TRANEXAMIC ACID-NACL 1000-0.7 MG/100ML-% IV SOLN
1000.0000 mg | INTRAVENOUS | Status: AC
  Administered 2020-11-21: 1000 mg via INTRAVENOUS
  Filled 2020-11-21: qty 100

## 2020-11-21 MED ORDER — METOCLOPRAMIDE HCL 5 MG/ML IJ SOLN: 5.0000 mg | Freq: Three times a day (TID) | INTRAMUSCULAR | Status: DC | PRN

## 2020-11-21 MED ORDER — LIDOCAINE 2% (20 MG/ML) 5 ML SYRINGE
INTRAMUSCULAR | Status: AC
  Filled 2020-11-21: qty 5

## 2020-11-21 MED ORDER — PROPOFOL 1000 MG/100ML IV EMUL
INTRAVENOUS | Status: AC
  Filled 2020-11-21: qty 100

## 2020-11-21 MED ORDER — METHOCARBAMOL 500 MG IVPB - SIMPLE MED
INTRAVENOUS | Status: AC
  Filled 2020-11-21: qty 50

## 2020-11-21 MED ORDER — HYDROMORPHONE HCL 1 MG/ML IJ SOLN: 0.2500 mg | INTRAMUSCULAR | Status: DC | PRN

## 2020-11-21 MED ORDER — PHENOL 1.4 % MT LIQD: 1.0000 | OROMUCOSAL | Status: DC | PRN

## 2020-11-21 MED ORDER — FERROUS SULFATE 325 (65 FE) MG PO TABS
325.0000 mg | ORAL_TABLET | Freq: Three times a day (TID) | ORAL | Status: DC
  Administered 2020-11-21 – 2020-11-22 (×3): 325 mg via ORAL
  Filled 2020-11-21 (×3): qty 1

## 2020-11-21 MED ORDER — DEXAMETHASONE SODIUM PHOSPHATE 10 MG/ML IJ SOLN
8.0000 mg | Freq: Once | INTRAMUSCULAR | Status: AC
  Administered 2020-11-21: 8 mg via INTRAVENOUS

## 2020-11-21 MED ORDER — BUPIVACAINE-EPINEPHRINE (PF) 0.25% -1:200000 IJ SOLN
INTRAMUSCULAR | Status: DC | PRN
  Administered 2020-11-21: 30 mL

## 2020-11-21 MED ORDER — HYDROMORPHONE HCL 1 MG/ML IJ SOLN: 0.5000 mg | INTRAMUSCULAR | Status: DC | PRN

## 2020-11-21 MED ORDER — BEMPEDOIC ACID-EZETIMIBE 180-10 MG PO TABS: 1.0000 | ORAL_TABLET | Freq: Every day | ORAL | Status: DC

## 2020-11-21 MED ORDER — ORAL CARE MOUTH RINSE: 15.0000 mL | Freq: Once | OROMUCOSAL | Status: AC

## 2020-11-21 MED ORDER — CELECOXIB 200 MG PO CAPS
200.0000 mg | ORAL_CAPSULE | Freq: Two times a day (BID) | ORAL | Status: DC
  Administered 2020-11-21 – 2020-11-22 (×2): 200 mg via ORAL
  Filled 2020-11-21 (×2): qty 1

## 2020-11-21 MED ORDER — ONDANSETRON HCL 4 MG/2ML IJ SOLN
INTRAMUSCULAR | Status: DC | PRN
  Administered 2020-11-21: 4 mg via INTRAVENOUS

## 2020-11-21 MED ORDER — PROMETHAZINE HCL 25 MG/ML IJ SOLN: 6.2500 mg | INTRAMUSCULAR | Status: DC | PRN

## 2020-11-21 MED ORDER — BUPIVACAINE IN DEXTROSE 0.75-8.25 % IT SOLN
INTRATHECAL | Status: DC | PRN
  Administered 2020-11-21: 1.6 mL via INTRATHECAL

## 2020-11-21 MED ORDER — 0.9 % SODIUM CHLORIDE (POUR BTL) OPTIME
TOPICAL | Status: DC | PRN
  Administered 2020-11-21: 1000 mL

## 2020-11-21 MED ORDER — LACTATED RINGERS IV SOLN: INTRAVENOUS | Status: DC

## 2020-11-21 MED ORDER — FENTANYL CITRATE (PF) 100 MCG/2ML IJ SOLN
INTRAMUSCULAR | Status: AC
  Filled 2020-11-21: qty 2

## 2020-11-21 MED ORDER — FENTANYL CITRATE (PF) 100 MCG/2ML IJ SOLN
INTRAMUSCULAR | Status: DC | PRN
  Administered 2020-11-21 (×2): 50 ug via INTRAVENOUS

## 2020-11-21 MED ORDER — MIDAZOLAM HCL 2 MG/2ML IJ SOLN
INTRAMUSCULAR | Status: AC
  Filled 2020-11-21: qty 2

## 2020-11-21 MED ORDER — KETOROLAC TROMETHAMINE 30 MG/ML IJ SOLN
INTRAMUSCULAR | Status: AC
  Filled 2020-11-21: qty 1

## 2020-11-21 MED ORDER — DEXAMETHASONE SODIUM PHOSPHATE 10 MG/ML IJ SOLN
INTRAMUSCULAR | Status: AC
  Filled 2020-11-21: qty 1

## 2020-11-21 MED ORDER — SODIUM CHLORIDE (PF) 0.9 % IJ SOLN
INTRAMUSCULAR | Status: AC
  Filled 2020-11-21: qty 30

## 2020-11-21 MED ORDER — CHLORHEXIDINE GLUCONATE 0.12 % MT SOLN
15.0000 mL | Freq: Once | OROMUCOSAL | Status: AC
  Administered 2020-11-21: 15 mL via OROMUCOSAL

## 2020-11-21 MED ORDER — KETOROLAC TROMETHAMINE 30 MG/ML IJ SOLN
INTRAMUSCULAR | Status: DC | PRN
  Administered 2020-11-21: 30 mg

## 2020-11-21 MED ORDER — METOCLOPRAMIDE HCL 5 MG PO TABS: 5.0000 mg | ORAL_TABLET | Freq: Three times a day (TID) | ORAL | Status: DC | PRN

## 2020-11-21 MED ORDER — SODIUM CHLORIDE (PF) 0.9 % IJ SOLN
INTRAMUSCULAR | Status: DC | PRN
  Administered 2020-11-21: 30 mL

## 2020-11-21 MED ORDER — OXYCODONE HCL 5 MG PO TABS
10.0000 mg | ORAL_TABLET | ORAL | Status: DC | PRN
  Administered 2020-11-22 (×2): 15 mg via ORAL
  Filled 2020-11-21 (×2): qty 3

## 2020-11-21 MED ORDER — STERILE WATER FOR IRRIGATION IR SOLN
Status: DC | PRN
  Administered 2020-11-21: 2000 mL

## 2020-11-21 MED ORDER — MENTHOL 3 MG MT LOZG: 1.0000 | LOZENGE | OROMUCOSAL | Status: DC | PRN

## 2020-11-21 MED ORDER — LORATADINE 10 MG PO TABS
10.0000 mg | ORAL_TABLET | Freq: Every day | ORAL | Status: DC
  Administered 2020-11-22: 10 mg via ORAL
  Filled 2020-11-21: qty 1

## 2020-11-21 MED ORDER — METOPROLOL TARTRATE 12.5 MG HALF TABLET
12.5000 mg | ORAL_TABLET | Freq: Two times a day (BID) | ORAL | Status: DC
  Administered 2020-11-21 – 2020-11-22 (×2): 12.5 mg via ORAL
  Filled 2020-11-21 (×2): qty 1

## 2020-11-21 MED ORDER — ONDANSETRON HCL 4 MG PO TABS: 4.0000 mg | ORAL_TABLET | Freq: Four times a day (QID) | ORAL | Status: DC | PRN

## 2020-11-21 MED ORDER — BUPIVACAINE HCL (PF) 0.5 % IJ SOLN
INTRAMUSCULAR | Status: DC | PRN
  Administered 2020-11-21: 20 mL via PERINEURAL

## 2020-11-21 MED ORDER — BISACODYL 10 MG RE SUPP: 10.0000 mg | Freq: Every day | RECTAL | Status: DC | PRN

## 2020-11-21 MED ORDER — OXYCODONE HCL 5 MG PO TABS
5.0000 mg | ORAL_TABLET | ORAL | Status: DC | PRN
  Administered 2020-11-21: 5 mg via ORAL
  Administered 2020-11-21 – 2020-11-22 (×2): 10 mg via ORAL
  Filled 2020-11-21 (×2): qty 2

## 2020-11-21 MED ORDER — PNEUMOCOCCAL VAC POLYVALENT 25 MCG/0.5ML IJ INJ
0.5000 mL | INJECTION | INTRAMUSCULAR | Status: AC
  Administered 2020-11-22: 0.5 mL via INTRAMUSCULAR
  Filled 2020-11-21: qty 0.5

## 2020-11-21 MED ORDER — POVIDONE-IODINE 10 % EX SWAB
2.0000 "application " | Freq: Once | CUTANEOUS | Status: AC
  Administered 2020-11-21: 2 via TOPICAL

## 2020-11-21 MED ORDER — METHOCARBAMOL 500 MG PO TABS
500.0000 mg | ORAL_TABLET | Freq: Four times a day (QID) | ORAL | Status: DC | PRN
  Administered 2020-11-22: 500 mg via ORAL
  Filled 2020-11-21: qty 1

## 2020-11-21 MED ORDER — PROPOFOL 500 MG/50ML IV EMUL
INTRAVENOUS | Status: DC | PRN
  Administered 2020-11-21: 100 ug/kg/min via INTRAVENOUS

## 2020-11-21 MED ORDER — DEXAMETHASONE SODIUM PHOSPHATE 10 MG/ML IJ SOLN
10.0000 mg | Freq: Once | INTRAMUSCULAR | Status: AC
  Administered 2020-11-22: 10 mg via INTRAVENOUS
  Filled 2020-11-21: qty 1

## 2020-11-21 MED ORDER — SODIUM CHLORIDE 0.9 % IV SOLN
2.0000 g | Freq: Four times a day (QID) | INTRAVENOUS | Status: AC
  Administered 2020-11-21 (×2): 2 g via INTRAVENOUS
  Filled 2020-11-21 (×2): qty 2

## 2020-11-21 MED ORDER — DOCUSATE SODIUM 100 MG PO CAPS
100.0000 mg | ORAL_CAPSULE | Freq: Two times a day (BID) | ORAL | Status: DC
  Administered 2020-11-21 – 2020-11-22 (×3): 100 mg via ORAL
  Filled 2020-11-21 (×3): qty 1

## 2020-11-21 MED ORDER — LIDOCAINE 2% (20 MG/ML) 5 ML SYRINGE
INTRAMUSCULAR | Status: DC | PRN
  Administered 2020-11-21: 40 mg via INTRAVENOUS

## 2020-11-21 MED ORDER — OXYCODONE HCL 5 MG PO TABS
ORAL_TABLET | ORAL | Status: AC
  Filled 2020-11-21: qty 1

## 2020-11-21 MED ORDER — SODIUM CHLORIDE 0.9 % IV SOLN: INTRAVENOUS | Status: DC

## 2020-11-21 MED ORDER — ASPIRIN 81 MG PO CHEW
81.0000 mg | CHEWABLE_TABLET | Freq: Two times a day (BID) | ORAL | Status: DC
  Administered 2020-11-21 – 2020-11-22 (×2): 81 mg via ORAL
  Filled 2020-11-21 (×2): qty 1

## 2020-11-21 MED ORDER — TRAZODONE HCL 50 MG PO TABS
50.0000 mg | ORAL_TABLET | Freq: Every day | ORAL | Status: DC
  Administered 2020-11-21: 50 mg via ORAL
  Filled 2020-11-21: qty 1

## 2020-11-21 MED ORDER — BUPIVACAINE-EPINEPHRINE (PF) 0.25% -1:200000 IJ SOLN
INTRAMUSCULAR | Status: AC
  Filled 2020-11-21: qty 30

## 2020-11-21 MED ORDER — DIPHENHYDRAMINE HCL 12.5 MG/5ML PO ELIX: 12.5000 mg | ORAL_SOLUTION | ORAL | Status: DC | PRN

## 2020-11-21 MED ORDER — METHOCARBAMOL 500 MG IVPB - SIMPLE MED
500.0000 mg | Freq: Four times a day (QID) | INTRAVENOUS | Status: DC | PRN
  Administered 2020-11-21: 500 mg via INTRAVENOUS
  Filled 2020-11-21: qty 50

## 2020-11-21 MED ORDER — PANTOPRAZOLE SODIUM 40 MG PO TBEC
40.0000 mg | DELAYED_RELEASE_TABLET | Freq: Every day | ORAL | Status: DC
  Administered 2020-11-22: 40 mg via ORAL
  Filled 2020-11-21: qty 1

## 2020-11-21 MED ORDER — CHLORHEXIDINE GLUCONATE CLOTH 2 % EX PADS
6.0000 | MEDICATED_PAD | Freq: Every day | CUTANEOUS | Status: DC
  Administered 2020-11-21: 6 via TOPICAL

## 2020-11-21 MED ORDER — MIDAZOLAM HCL 5 MG/5ML IJ SOLN
INTRAMUSCULAR | Status: DC | PRN
  Administered 2020-11-21 (×2): 1 mg via INTRAVENOUS

## 2020-11-21 SURGICAL SUPPLY — 53 items
ATTUNE MED ANAT PAT 41 KNEE (Knees) ×2 IMPLANT
ATTUNE PS FEM RT SZ 7 CEM KNEE (Femur) ×2 IMPLANT
ATTUNE PSRP INSR SZ7 7 KNEE (Insert) ×2 IMPLANT
BAG COUNTER SPONGE SURGICOUNT (BAG) IMPLANT
BAG ZIPLOCK 12X15 (MISCELLANEOUS) IMPLANT
BASE TIBIAL ATTUNE KNEE SZ9 (Knees) ×1 IMPLANT
BLADE SAW SGTL 11.0X1.19X90.0M (BLADE) IMPLANT
BLADE SAW SGTL 13.0X1.19X90.0M (BLADE) ×2 IMPLANT
BLADE SURG SZ10 CARB STEEL (BLADE) ×4 IMPLANT
BNDG ELASTIC 6X5.8 VLCR STR LF (GAUZE/BANDAGES/DRESSINGS) ×2 IMPLANT
BOWL SMART MIX CTS (DISPOSABLE) ×2 IMPLANT
CEMENT HV SMART SET (Cement) ×4 IMPLANT
CUFF TOURN SGL QUICK 34 (TOURNIQUET CUFF) ×2
CUFF TRNQT CYL 34X4.125X (TOURNIQUET CUFF) ×1 IMPLANT
DECANTER SPIKE VIAL GLASS SM (MISCELLANEOUS) ×4 IMPLANT
DERMABOND ADVANCED (GAUZE/BANDAGES/DRESSINGS) ×1
DERMABOND ADVANCED .7 DNX12 (GAUZE/BANDAGES/DRESSINGS) ×1 IMPLANT
DRAPE U-SHAPE 47X51 STRL (DRAPES) ×2 IMPLANT
DRESSING AQUACEL AG SP 3.5X10 (GAUZE/BANDAGES/DRESSINGS) ×1 IMPLANT
DRSG AQUACEL AG ADV 3.5X10 (GAUZE/BANDAGES/DRESSINGS) ×2 IMPLANT
DRSG AQUACEL AG SP 3.5X10 (GAUZE/BANDAGES/DRESSINGS) ×2
DURAPREP 26ML APPLICATOR (WOUND CARE) ×4 IMPLANT
ELECT REM PT RETURN 15FT ADLT (MISCELLANEOUS) ×2 IMPLANT
GLOVE SURG ENC MOIS LTX SZ6 (GLOVE) IMPLANT
GLOVE SURG UNDER LTX SZ7.5 (GLOVE) ×2 IMPLANT
GLOVE SURG UNDER POLY LF SZ6.5 (GLOVE) IMPLANT
GLOVE SURG UNDER POLY LF SZ7.5 (GLOVE) ×2 IMPLANT
GOWN STRL REUS W/TWL LRG LVL3 (GOWN DISPOSABLE) ×2 IMPLANT
HANDPIECE INTERPULSE COAX TIP (DISPOSABLE) ×2
HOLDER FOLEY CATH W/STRAP (MISCELLANEOUS) IMPLANT
KIT TURNOVER KIT A (KITS) ×2 IMPLANT
MANIFOLD NEPTUNE II (INSTRUMENTS) ×2 IMPLANT
NDL SAFETY ECLIPSE 18X1.5 (NEEDLE) IMPLANT
NEEDLE HYPO 18GX1.5 SHARP (NEEDLE)
NS IRRIG 1000ML POUR BTL (IV SOLUTION) ×2 IMPLANT
PACK TOTAL KNEE CUSTOM (KITS) ×2 IMPLANT
PENCIL SMOKE EVACUATOR (MISCELLANEOUS) IMPLANT
PIN DRILL FIX HALF THREAD (BIT) ×2 IMPLANT
PIN FIX SIGMA LCS THRD HI (PIN) ×2 IMPLANT
PROTECTOR NERVE ULNAR (MISCELLANEOUS) ×2 IMPLANT
SET HNDPC FAN SPRY TIP SCT (DISPOSABLE) ×1 IMPLANT
SET PAD KNEE POSITIONER (MISCELLANEOUS) ×2 IMPLANT
SUT MNCRL AB 4-0 PS2 18 (SUTURE) ×2 IMPLANT
SUT STRATAFIX PDS+ 0 24IN (SUTURE) ×2 IMPLANT
SUT VIC AB 1 CT1 36 (SUTURE) ×2 IMPLANT
SUT VIC AB 2-0 CT1 27 (SUTURE) ×6
SUT VIC AB 2-0 CT1 TAPERPNT 27 (SUTURE) ×3 IMPLANT
SYR 3ML LL SCALE MARK (SYRINGE) ×2 IMPLANT
TIBIAL BASE ATTUNE KNEE SZ9 (Knees) ×2 IMPLANT
TRAY FOLEY MTR SLVR 16FR STAT (SET/KITS/TRAYS/PACK) ×2 IMPLANT
TUBE SUCTION HIGH CAP CLEAR NV (SUCTIONS) ×2 IMPLANT
WATER STERILE IRR 1000ML POUR (IV SOLUTION) ×4 IMPLANT
WRAP KNEE MAXI GEL POST OP (GAUZE/BANDAGES/DRESSINGS) ×2 IMPLANT

## 2020-11-21 NOTE — Anesthesia Procedure Notes (Signed)
Procedure Name: MAC Date/Time: 11/21/2020 7:23 AM Performed by: Claudia Desanctis, CRNA Pre-anesthesia Checklist: Patient identified, Emergency Drugs available, Suction available and Patient being monitored Patient Re-evaluated:Patient Re-evaluated prior to induction Oxygen Delivery Method: Simple face mask

## 2020-11-21 NOTE — Evaluation (Signed)
Physical Therapy Evaluation Patient Details Name: [name] MRN: [id number] DOB: 04-23-1956 Today's Date: 11/21/2020   History of Present Illness  Patient is 65 y.o male s/p Rt TKA on 11/21/20 with PMH significant for OA, CAD, GERD, HTN, HLD, STEMI in 2019 with CABGx 2.    Clinical Impression  [name] is a 65 y.o. male POD 0 s/p Rt TKA. Patient reports independence with mobility at baseline. Patient is now limited by functional impairments (see PT problem list below) and requires min assist for transfers and gait with RW. Patient was able to ambulate ~40 feet with RW and min assist. Patient instructed in exercise to facilitate ROM and circulation to manage edema and reduce risk of DVT. Patient will benefit from continued skilled PT interventions to address impairments and progress towards PLOF. Acute PT will follow to progress mobility and stair training in preparation for safe discharge home.     Follow Up Recommendations Follow surgeon's recommendation for DC plan and follow-up therapies;Outpatient PT    Equipment Recommendations  3in1 (PT)    Recommendations for Other Services       Precautions / Restrictions Precautions Precautions: Fall Restrictions Weight Bearing Restrictions: No Other Position/Activity Restrictions: WBAT      Mobility  Bed Mobility Overal bed mobility: Needs Assistance Bed Mobility: Supine to Sit     Supine to sit: Supervision;Min guard;HOB elevated     General bed mobility comments: cues to use bed rail, no assist for Rt LE to EOB, pt taking extra time.    Transfers Overall transfer level: Needs assistance Equipment used: Rolling walker (2 wheeled) Transfers: Sit to/from Stand Sit to Stand: Min assist         General transfer comment: cues for hand placement and assist to initaite and complete power up from low EOB.  Ambulation/Gait Ambulation/Gait assistance: Min assist Gait Distance (Feet): 40 Feet Assistive device: Rolling  walker (2 wheeled) Gait Pattern/deviations: Step-to pattern;Decreased stride length Gait velocity: decr   General Gait Details: cues for step to pattern and proximity to RW, no buckling at Rt knee and no LOB noted. assist initially for walker position and pt progressed to guarding for safety.  Stairs            Wheelchair Mobility    Modified Rankin (Stroke Patients Only)       Balance Overall balance assessment: Mild deficits observed, not formally tested                                           Pertinent Vitals/Pain Pain Assessment: 0-10 Pain Score: 3  Pain Location: Rt knee Pain Descriptors / Indicators: Aching;Discomfort Pain Intervention(s): Limited activity within patient's tolerance;Monitored during session;Premedicated before session;Repositioned    Home Living Family/patient expects to be discharged to:: Private residence Living Arrangements: Spouse/significant other Available Help at Discharge: Family Type of Home: House Home Access: Stairs to enter Entrance Stairs-Rails: None Entrance Stairs-Number of Steps: 1 Home Layout: One level Home Equipment: Environmental consultant - 2 wheels;Cane - single point      Prior Function Level of Independence: Independent               Hand Dominance   Dominant Hand: Right    Extremity/Trunk Assessment   Upper Extremity Assessment Upper Extremity Assessment: Overall WFL for tasks assessed    Lower Extremity Assessment Lower Extremity Assessment: RLE deficits/detail RLE Deficits / Details: good  quad activaiton, no extensor lag with SLR RLE Sensation: WNL RLE Coordination: WNL    Cervical / Trunk Assessment Cervical / Trunk Assessment: Normal  Communication   Communication: No difficulties  Cognition Arousal/Alertness: Awake/alert Behavior During Therapy: WFL for tasks assessed/performed Overall Cognitive Status: Within Functional Limits for tasks assessed                                         General Comments      Exercises Total Joint Exercises Ankle Circles/Pumps: AROM;Both;20 reps;Seated Quad Sets: AROM;Right;5 reps;Seated Heel Slides: AROM;Right;5 reps;Seated   Assessment/Plan    PT Assessment Patient needs continued PT services  PT Problem List Decreased strength;Decreased range of motion;Decreased activity tolerance;Decreased balance;Decreased mobility;Decreased knowledge of use of DME;Decreased knowledge of precautions       PT Treatment Interventions DME instruction;Gait training;Stair training;Therapeutic activities;Therapeutic exercise;Functional mobility training;Balance training;Patient/family education    PT Goals (Current goals can be found in the Care Plan section)  Acute Rehab PT Goals Patient Stated Goal: get back to golf PT Goal Formulation: With patient Time For Goal Achievement: 11/28/20 Potential to Achieve Goals: Good    Frequency 7X/week   Barriers to discharge        Co-evaluation               AM-PAC PT "6 Clicks" Mobility  Outcome Measure Help needed turning from your back to your side while in a flat bed without using bedrails?: A Little Help needed moving from lying on your back to sitting on the side of a flat bed without using bedrails?: A Little Help needed moving to and from a bed to a chair (including a wheelchair)?: A Little Help needed standing up from a chair using your arms (e.g., wheelchair or bedside chair)?: A Little Help needed to walk in hospital room?: A Little Help needed climbing 3-5 steps with a railing? : A Little 6 Click Score: 18    End of Session Equipment Utilized During Treatment: Gait belt Activity Tolerance: Patient tolerated treatment well Patient left: in chair;with call bell/phone within reach;with chair alarm set;with family/visitor present Nurse Communication: Mobility status PT Visit Diagnosis: Muscle weakness (generalized) (M62.81);Difficulty in walking, not elsewhere  classified (R26.2)    Time: [id number] PT Time Calculation (min) (ACUTE ONLY): 24 min   Charges:   PT Evaluation $PT Eval Low Complexity: 1 Low PT Treatments $Gait Training: 8-22 mins        Verner Mould, DPT Acute Rehabilitation Services Office [phone] Pager [phone]   Jacques Navy 11/21/2020, 3:37 PM

## 2020-11-21 NOTE — Anesthesia Procedure Notes (Signed)
Anesthesia Regional Block: Adductor canal block   Pre-Anesthetic Checklist: , timeout performed,  Correct Patient, Correct Site, Correct Laterality,  Correct Procedure, Correct Position, site marked,  Risks and benefits discussed,  Surgical consent,  Pre-op evaluation,  At surgeon's request and post-op pain management  Laterality: Right  Prep: chloraprep       Needles:  Injection technique: Single-shot  Needle Type: Echogenic Needle     Needle Length: 9cm      Additional Needles:   Procedures:,,,, ultrasound used (permanent image in chart),,    Narrative:  Start time: 11/21/2020 6:50 AM End time: 11/21/2020 6:56 AM Injection made incrementally with aspirations every 5 mL.  Performed by: Personally  Anesthesiologist: Myrtie Soman, MD  Additional Notes: Patient tolerated the procedure well without complications

## 2020-11-21 NOTE — Anesthesia Procedure Notes (Signed)
Anesthesia Procedure Image    

## 2020-11-21 NOTE — Discharge Instructions (Signed)

## 2020-11-21 NOTE — Op Note (Signed)
NAME:  [name]                      MEDICAL RECORD NO.:  [id number]                             FACILITY:  [facility]      PHYSICIAN:  Pietro Cassis. Alvan Dame, M.D.  DATE OF BIRTH:  Jun 30, 1955      DATE OF PROCEDURE:  11/21/2020                                     OPERATIVE REPORT         PREOPERATIVE DIAGNOSIS:  Right knee osteoarthritis.      POSTOPERATIVE DIAGNOSIS:  Right knee osteoarthritis.      FINDINGS:  The patient was noted to have complete loss of cartilage and   bone-on-bone arthritis with associated osteophytes in the patellofemoral and medial compartments of   the knee with a significant synovitis and associated effusion.  The patient had failed months of conservative treatment including medications, injection therapy, activity modification.     PROCEDURE:  Right total knee replacement.      COMPONENTS USED:  DePuy Attune rotating platform posterior stabilized knee   system, a size 7 femur, 9 tibia, size 7 mm PS AOX insert, and 41 anatomic patellar   button.      SURGEON:  Pietro Cassis. Alvan Dame, M.D.      ASSISTANT:  Costella Hatcher, PA-C.      ANESTHESIA:  Regional and Spinal.      SPECIMENS:  None.      COMPLICATION:  None.      DRAINS:  None.  EBL: <100 cc      TOURNIQUET TIME:   Total Tourniquet Time Documented: Thigh (Right) - 33 minutes Total: Thigh (Right) - 33 minutes  .      The patient was stable to the recovery room.      INDICATION FOR PROCEDURE:  [name] is a 65 y.o. male patient of   mine.  The patient had been seen, evaluated, and treated for months conservatively in the   office with medication, activity modification, and injections.  The patient had   radiographic changes of bone-on-bone arthritis with endplate sclerosis and osteophytes noted.  Based on the radiographic changes and failed conservative measures, the patient   decided to proceed with definitive treatment, total knee replacement.  Risks of infection, DVT, component failure, need for  revision surgery, neurovascular injury were reviewed in the office setting.  The postop course was reviewed stressing the efforts to maximize post-operative satisfaction and function.  Consent was obtained for benefit of pain   relief.      PROCEDURE IN DETAIL:  The patient was brought to the operative theater.   Once adequate anesthesia, preoperative antibiotics, 2 gm of Ancef,1 gm of Tranexamic Acid, and 10 mg of Decadron administered, the patient was positioned supine with a right thigh tourniquet placed.  The  right lower extremity was prepped and draped in sterile fashion.  A time-   out was performed identifying the patient, planned procedure, and the appropriate extremity.      The right lower extremity was placed in the [facility] leg holder.  The leg was   exsanguinated, tourniquet elevated to 250 mmHg.  A midline incision was   made followed  by median parapatellar arthrotomy.  Following initial   exposure, attention was first directed to the patella.  Precut   measurement was noted to be 23 mm.  I resected down to 14 mm and used a   41 anatomic patellar button to restore patellar height as well as cover the cut surface.      The lug holes were drilled and a metal shim was placed to protect the   patella from retractors and saw blade during the procedure.      At this point, attention was now directed to the femur.  The femoral   canal was opened with a drill, irrigated to try to prevent fat emboli.  An   intramedullary rod was passed at 5 degrees valgus, 11 mm of bone was   resected off the distal femur due to pre-operative flexion contracture.  Following this resection, the tibia was   subluxated anteriorly.  Using the extramedullary guide, 2 mm of bone was resected off   the proximal medial tibia.  We confirmed the gap would be   stable medially and laterally with a size 5 spacer block as well as confirmed that the tibial cut was perpendicular in the coronal plane, checking with an  alignment rod.      Once this was done, I sized the femur to be a size 7 in the anterior-   posterior dimension, chose a standard component based on medial and   lateral dimension.  The size 7 rotation block was then pinned in   position anterior referenced using the C-clamp to set rotation.  The   anterior, posterior, and  chamfer cuts were made without difficulty nor   notching making certain that I was along the anterior cortex to help   with flexion gap stability.      The final box cut was made off the lateral aspect of distal femur.      At this point, the tibia was sized to be a size 9.  The size 9 tray was   then pinned in position through the medial third of the tubercle,   drilled, and keel punched.  Trial reduction was now carried with a 7 femur,  9 tibia, a size 7 mm PS insert, and the 41 anatomic patella botton.  The knee was brought to full extension with good flexion stability with the patella   tracking through the trochlea without application of pressure.  Given   all these findings the trial components removed.  Final components were   opened and cement was mixed.  The knee was irrigated with normal saline solution and pulse lavage.  The synovial lining was   then injected with 30 cc of 0.25% Marcaine with epinephrine, 1 cc of Toradol and 30 cc of NS for a total of 61 cc.     Final implants were then cemented onto cleaned and dried cut surfaces of bone with the knee brought to extension with a size 7 mm PS trial insert.      Once the cement had fully cured, excess cement was removed   throughout the knee.  I confirmed that I was satisfied with the range of   motion and stability, and the final size 7 mm PS AOX insert was chosen.  It was   placed into the knee.      The tourniquet had been let down at 33 minutes.  No significant   hemostasis was required.  The extensor mechanism was then reapproximated  using #1 Vicryl and #1 Stratafix sutures with the knee   in flexion.   The   remaining wound was closed with 2-0 Vicryl and running 4-0 Monocryl.   The knee was cleaned, dried, dressed sterilely using Dermabond and   Aquacel dressing.  The patient was then   brought to recovery room in stable condition, tolerating the procedure   well.   Please note that Physician Assistant, Costella Hatcher, PA-C was present for the entirety of the case, and was utilized for pre-operative positioning, peri-operative retractor management, general facilitation of the procedure and for primary wound closure at the end of the case.              Pietro Cassis Alvan Dame, M.D.    11/21/2020 8:46 AM

## 2020-11-21 NOTE — Anesthesia Postprocedure Evaluation (Signed)
Anesthesia Post Note  Patient: [name]  Procedure(s) Performed: TOTAL KNEE ARTHROPLASTY (Right: Knee)     Patient location during evaluation: PACU Anesthesia Type: Spinal Level of consciousness: oriented and awake and alert Pain management: pain level controlled Vital Signs Assessment: post-procedure vital signs reviewed and stable Respiratory status: spontaneous breathing, respiratory function stable and patient connected to nasal cannula oxygen Cardiovascular status: blood pressure returned to baseline and stable Postop Assessment: no headache, no backache and no apparent nausea or vomiting Anesthetic complications: no   No notable events documented.  Last Vitals:  Vitals:   11/21/20 0945 11/21/20 1000  BP: (!) 126/101 (!) 157/72  Pulse: (!) 54 (!) 51  Resp: 17 14  Temp:  36.4 C  SpO2: 98% 99%    Last Pain:  Vitals:   11/21/20 1000  TempSrc:   PainSc: 0-No pain                 [name]

## 2020-11-21 NOTE — Anesthesia Procedure Notes (Signed)
Spinal  Patient location during procedure: OR Start time: 11/21/2020 7:21 AM End time: 11/21/2020 7:25 AM Reason for block: surgical anesthesia Staffing Anesthesiologist: Myrtie Soman, MD Preanesthetic Checklist Completed: patient identified, IV checked, site marked, risks and benefits discussed, surgical consent, monitors and equipment checked, pre-op evaluation and timeout performed Spinal Block Patient position: sitting Prep: DuraPrep Patient monitoring: heart rate, cardiac monitor, continuous pulse ox and blood pressure Approach: midline Location: L3-4 Injection technique: single-shot Needle Needle type: Sprotte  Needle gauge: 24 G Needle length: 9 cm Assessment Sensory level: T6 Events: CSF return

## 2020-11-21 NOTE — Transfer of Care (Signed)
Immediate Anesthesia Transfer of Care Note  Patient: [name]  Procedure(s) Performed: TOTAL KNEE ARTHROPLASTY (Right: Knee)  Patient Location: PACU  Anesthesia Type:Spinal  Level of Consciousness: awake and patient cooperative  Airway & Oxygen Therapy: Patient Spontanous Breathing and Patient connected to face mask  Post-op Assessment: Report given to RN and Post -op Vital signs reviewed and stable  Post vital signs: Reviewed and stable  Last Vitals:  Vitals Value Taken Time  BP 107/58 11/21/20 0910  Temp    Pulse 57 11/21/20 0911  Resp 14 11/21/20 0911  SpO2 97 % 11/21/20 0911  Vitals shown include unvalidated device data.  Last Pain:  Vitals:   11/21/20 0548  TempSrc:   PainSc: 0-No pain         Complications: No notable events documented.

## 2020-11-21 NOTE — Progress Notes (Signed)
Orthopedic Tech Progress Note Patient Details:  [name] 10-15-1955 [id number]  Ortho Devices Ortho Device/Splint Location: Trapeze bar Ortho Device/Splint Interventions: Application   Post Interventions Patient Tolerated: Well Instructions Provided: Care of device  [name] 11/21/2020, 5:33 PM

## 2020-11-22 ENCOUNTER — Encounter (HOSPITAL_COMMUNITY): Payer: Self-pay | Admitting: Orthopedic Surgery

## 2020-11-22 DIAGNOSIS — M1711 Unilateral primary osteoarthritis, right knee: Secondary | ICD-10-CM | POA: Diagnosis not present

## 2020-11-22 LAB — BASIC METABOLIC PANEL
Anion gap: 8 (ref 5–15)
BUN: 19 mg/dL (ref 8–23)
CO2: 25 mmol/L (ref 22–32)
Calcium: 9.2 mg/dL (ref 8.9–10.3)
Chloride: 106 mmol/L (ref 98–111)
Creatinine, Ser: 0.88 mg/dL (ref 0.61–1.24)
GFR, Estimated: >60 mL/min (ref >60)
Glucose, Bld: 181 mg/dL — ABNORMAL HIGH (ref 70–99)
Potassium: 4.4 mmol/L (ref 3.5–5.1)
Sodium: 139 mmol/L (ref 135–145)

## 2020-11-22 LAB — CBC
HCT: 39.5 % (ref 39.0–52.0)
Hemoglobin: 13.1 g/dL (ref 13.0–17.0)
MCH: 30.2 pg (ref 26.0–34.0)
MCHC: 33.2 g/dL (ref 30.0–36.0)
MCV: 91 fL (ref 80.0–100.0)
Platelets: 226 10*3/uL (ref 150–400)
RBC: 4.34 MIL/uL (ref 4.22–5.81)
RDW: 12.6 % (ref 11.5–15.5)
WBC: 18 10*3/uL — ABNORMAL HIGH (ref 4.0–10.5)
nRBC: 0 % (ref 0.0–0.2)

## 2020-11-22 MED ORDER — ASPIRIN 81 MG PO CHEW: 81.0000 mg | CHEWABLE_TABLET | Freq: Two times a day (BID) | ORAL | 0 refills | Status: AC

## 2020-11-22 MED ORDER — CELECOXIB 200 MG PO CAPS: 200.0000 mg | ORAL_CAPSULE | Freq: Two times a day (BID) | ORAL | 0 refills | Status: DC

## 2020-11-22 MED ORDER — POLYETHYLENE GLYCOL 3350 17 G PO PACK: 17.0000 g | PACK | Freq: Every day | ORAL | 0 refills | Status: DC | PRN

## 2020-11-22 MED ORDER — DOCUSATE SODIUM 100 MG PO CAPS: 100.0000 mg | ORAL_CAPSULE | Freq: Two times a day (BID) | ORAL | 0 refills | Status: DC

## 2020-11-22 MED ORDER — METHOCARBAMOL 500 MG PO TABS
500.0000 mg | ORAL_TABLET | Freq: Four times a day (QID) | ORAL | 0 refills | Status: DC | PRN
Start: 2020-11-22 — End: 2021-03-02

## 2020-11-22 MED ORDER — OXYCODONE HCL 5 MG PO TABS: 5.0000 mg | ORAL_TABLET | Freq: Four times a day (QID) | ORAL | 0 refills | Status: DC | PRN

## 2020-11-22 NOTE — TOC Transition Note (Signed)
Transition of Care [facility]) - CM/SW Discharge Note  Patient Details  Name: [name] MRN: [id number] Date of Birth: 03-02-1956  [facility]) CM/SW Contact:  Sherie Don, LCSW Phone Number: 11/22/2020, 1:59 PM  Clinical Narrative: Patient is expected to discharge after working with PT. CSW met with patient to discuss discharge needs. Patient will go to [facility] for OPPT. Per patient, he will need a 3N1 and an ice machine. Ortho PA has placed DME orders.  Patient's Worker's IT sales professional is Aundra Dubin ([phone]). CSW spoke with Ms. Sheralyn Boatman, who requested that DME orders be faxed to her [phone]). DME orders faxed and CSW confirmed receipt with Ms. Coates. Worker's Comp to set up patient's DME. CSW updated patient and spouse. TOC signing off.  Final next level of care: OP Rehab Barriers to Discharge: No Barriers Identified  Patient Goals and CMS Choice Patient states their goals for this hospitalization and ongoing recovery are:: Discharge home with [place] CMS Medicare.gov Compare Post Acute Care list provided to:: Patient Choice offered to / list presented to : Patient  Discharge Plan and Services         DME Arranged: 3-N-1, Other see comment (Ice machine) DME Agency: NA Representative spoke with at DME Agency: Will be set up through patient's Worker's Comp  Readmission Risk Interventions No flowsheet data found.

## 2020-11-22 NOTE — Progress Notes (Signed)
Physical Therapy Treatment Patient Details Name: [name] MRN: [id number] DOB: 1955/07/08 Today's Date: 11/22/2020    History of Present Illness Patient is 65 y.o male s/p Rt TKA on 11/21/20 with PMH significant for OA, CAD, GERD, HTN, HLD, STEMI in 2019 with CABGx 2.    PT Comments    Pt continues to progress toward acute PT goals this session with progression to stair training. Pt ambulated total of ~132f with MIN guard-supervision, no LOB observed. Pt performed safe stair negotiation with MIN A for RW stability and cues for proper sequencing. Pt's wife demonstrated safe guarding technique for all mobility following cues from therapist. PT reviewed LE HEP, pt demonstrated understanding. Pt is currently at safe mobility level for d/c to home and will benefit from continued skilled PT to increase their independence and maximize safety with mobility.     Follow Up Recommendations  Follow surgeon's recommendation for DC plan and follow-up therapies;Outpatient PT     Equipment Recommendations  3in1 (PT)    Recommendations for Other Services       Precautions / Restrictions Precautions Precautions: Fall Restrictions Weight Bearing Restrictions: No RLE Weight Bearing: Weight bearing as tolerated    Mobility  Bed Mobility Overal bed mobility: Needs Assistance Bed Mobility: Supine to Sit     Supine to sit: Supervision;HOB elevated     General bed mobility comments: supervision for safety, pt able toprorgess B LEs to EOB without assist    Transfers Overall transfer level: Needs assistance Equipment used: Rolling walker (2 wheeled) Transfers: Sit to/from Stand Sit to Stand: Min guard         General transfer comment: MIN guard for safety with cues for safe hand placement, pt with use of B UEs for power up  Ambulation/Gait Ambulation/Gait assistance: Min guard;Supervision Gait Distance (Feet): 80 Feet Assistive device: Rolling walker (2 wheeled) Gait  Pattern/deviations: Step-to pattern;Decreased stride length     General Gait Details: Pt ambulated 84f then 8074fith standing rest break due to fatigue/Rt knee discomfort. MIN guard progressing to supervision with use of RW and step to gait pattern, no LOB observed. Pt's wife demonstrated safe guarding position following cues from therapist.   Stairs Stairs: Yes Stairs assistance: Min assist Stair Management: No rails;Forwards;With walker Number of Stairs: 2 General stair comments: Pt performed stair negotation with MIN A for RW stability and verbal cues for correct sequencing "up with the good, down with the bad." Pt able to verbalize correct sequenicng pattern, and pt's wife demonstrated safe guarding position following cues from therapist.   Wheelchair Mobility    Modified Rankin (Stroke Patients Only)       Balance Overall balance assessment: Needs assistance Sitting-balance support: Feet supported Sitting balance-Leahy Scale: Good     Standing balance support: Bilateral upper extremity supported;During functional activity Standing balance-Leahy Scale: Poor Standing balance comment: use of RW for standing balance                            Cognition Arousal/Alertness: Awake/alert Behavior During Therapy: WFL for tasks assessed/performed Overall Cognitive Status: Within Functional Limits for tasks assessed                                        Exercises Total Joint Exercises Ankle Circles/Pumps: AROM;Both;20 reps;Seated Quad Sets: AROM;Right;5 reps;Seated Heel Slides: AROM;Right;5 reps;Seated Hip ABduction/ADduction: AROM;Right;5 reps;Seated  Long Arc Quad: AROM;Right;5 reps;Seated    General Comments General comments (skin integrity, edema, etc.): PT educated pt and wife on safe techinques for mobility, car transfers, fall prevention strategies and approrpiate steps to take in case of falls      Pertinent Vitals/Pain Pain  Assessment: 0-10 Pain Score: 3  Pain Location: Rt knee Pain Descriptors / Indicators: Sore Pain Intervention(s): Limited activity within patient's tolerance;Monitored during session;Repositioned;Premedicated before session    Home Living                      Prior Function            PT Goals (current goals can now be found in the care plan section) Acute Rehab PT Goals Patient Stated Goal: get back to golf PT Goal Formulation: With patient/family Time For Goal Achievement: 11/28/20 Potential to Achieve Goals: Good Progress towards PT goals: Progressing toward goals    Frequency    7X/week      PT Plan Current plan remains appropriate    Co-evaluation              AM-PAC PT "6 Clicks" Mobility   Outcome Measure  Help needed turning from your back to your side while in a flat bed without using bedrails?: A Little Help needed moving from lying on your back to sitting on the side of a flat bed without using bedrails?: A Little Help needed moving to and from a bed to a chair (including a wheelchair)?: A Little Help needed standing up from a chair using your arms (e.g., wheelchair or bedside chair)?: A Little Help needed to walk in hospital room?: A Little Help needed climbing 3-5 steps with a railing? : A Little 6 Click Score: 18    End of Session Equipment Utilized During Treatment: Gait belt Activity Tolerance: Patient tolerated treatment well Patient left: in chair;with call bell/phone within reach;with chair alarm set;with family/visitor present Nurse Communication: Mobility status PT Visit Diagnosis: Unsteadiness on feet (R26.81);Muscle weakness (generalized) (M62.81);Pain Pain - Right/Left: Right Pain - part of body: Knee     Time: [id number] PT Time Calculation (min) (ACUTE ONLY): 39 min  Charges:  $Therapeutic Exercise: 8-22 mins $Therapeutic Activity: 23-37 mins                     Posey Rea, DPT  Acute Rehabilitation Services   Office [phone]   11/22/2020, 4:37 PM

## 2020-11-22 NOTE — Progress Notes (Signed)
Subjective: 1 Day Post-Op Procedure(s) (LRB): TOTAL KNEE ARTHROPLASTY (Right) Patient reports pain as mild.   Patient seen in rounds with Dr. Alvan Dame. Patient is well, and has had no acute complaints or problems. No acute events overnight. Foley catheter removed. Patient ambulated 40 feet with PT.  We will continue therapy today.   Objective: Vital signs in last 24 hours: Temp:  [97.5 F (36.4 C)-97.9 F (36.6 C)] 97.5 F (36.4 C) (07/27 0602) Pulse Rate:  [51-68] 57 (07/27 0602) Resp:  [11-21] 18 (07/27 0602) BP: (107-157)/(58-101) 131/73 (07/27 0602) SpO2:  [95 %-100 %] 98 % (07/27 0602) Weight:  [id number] kg] 108 kg (07/26 1236)  Intake/Output from previous day:  Intake/Output Summary (Last 24 hours) at 11/22/2020 0814 Last data filed at 11/22/2020 0545 Gross per 24 hour  Intake 3264.45 ml  Output 2400 ml  Net 864.45 ml     Intake/Output this shift: No intake/output data recorded.  Labs: Recent Labs    11/22/20 0328  HGB 13.1   Recent Labs    11/22/20 0328  WBC 18.0*  RBC 4.34  HCT 39.5  PLT 226   Recent Labs    11/22/20 0328  NA 139  K 4.4  CL 106  CO2 25  BUN 19  CREATININE 0.88  GLUCOSE 181*  CALCIUM 9.2   No results for input(s): LABPT, INR in the last 72 hours.  Exam: General - Patient is Alert and Oriented Extremity - Neurologically intact Sensation intact distally Intact pulses distally Dorsiflexion/Plantar flexion intact Dressing - dressing C/D/I Motor Function - intact, moving foot and toes well on exam.   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   Weight as of this encounter: 108 kg. Advance diet Up with therapy D/C IV fluids   Patient's anticipated LOS is less than 2 midnights, meeting these requirements: - Younger than 32 - Lives within 1 hour of care - Has a competent adult at home to recover with post-op recover - NO history of  - Chronic pain requiring opiods  - Diabetes  - Coronary Artery Disease  - Heart failure  - Heart attack  - Stroke  - DVT/VTE  - Cardiac arrhythmia  - Respiratory Failure/COPD  - Renal failure  - Anemia  - Advanced Liver disease   DVT Prophylaxis - Aspirin Weight bearing as tolerated.  Plan is to go Home after hospital stay. Plan for discharge today following 1-2 sessions of PT as long as they are meeting their goals. Patient is scheduled for OPPT.  Follow up in the office in 2 weeks.   Griffith Citron, PA-C Orthopedic Surgery [phone] 11/22/2020, 8:14 AM

## 2020-11-30 NOTE — Discharge Summary (Signed)
Physician Discharge Summary   Patient ID: [name] MRN: [id number] DOB/AGE: 1955-08-28 65 y.o.  Admit date: 11/21/2020 Discharge date: 11/22/2020  Primary Diagnosis: Right knee osteoarthritis.  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   Weight as of this encounter: 108 kg.  Procedure:  Procedure(s) (LRB): TOTAL KNEE ARTHROPLASTY (Right)   Consults: None  HPI:  [name] is a 65 y.o. male patient of  mine.  The patient had been seen, evaluated, and treated for months conservatively in the  office with medication, activity modification, and injections.  The patient had  radiographic changes of bone-on-bone arthritis with endplate sclerosis and osteophytes noted.  Based on the radiographic changes and failed conservative measures, the patient  decided to proceed with definitive treatment, total knee replacement.  Risks of infection, DVT, component failure, need for revision surgery, neurovascular injury were reviewed in the office setting.  The postop course was reviewed stressing the efforts to maximize post-operative satisfaction and function.  Consent  was obtained for benefit of pain  relief.  Laboratory Data: Admission on 11/21/2020, Discharged on 11/22/2020  Component Date Value Ref Range Status   WBC 11/22/2020 18.0 (A) 4.0 - 10.5 K/uL Final   RBC 11/22/2020 4.34  4.22 - 5.81 MIL/uL Final   Hemoglobin 11/22/2020 13.1  13.0 - 17.0 g/dL Final   HCT 11/22/2020 39.5  39.0 - 52.0 % Final   MCV 11/22/2020 91.0  80.0 - 100.0 fL Final   MCH 11/22/2020 30.2  26.0 - 34.0 pg Final   MCHC 11/22/2020 33.2  30.0 - 36.0 g/dL Final   RDW 11/22/2020 12.6  11.5 - 15.5 % Final   Platelets 11/22/2020 226  150 - 400 K/uL Final   nRBC 11/22/2020 0.0  0.0 - 0.2 % Final   Performed at [facility], [place] [address] [place], [place] [address]   Sodium 11/22/2020 139  135 - 145 mmol/L Final   Potassium 11/22/2020 4.4  3.5 - 5.1 mmol/L Final   Chloride 11/22/2020 106  98 - 111 mmol/L Final   CO2 11/22/2020 25  22 - 32 mmol/L Final   Glucose, Bld 11/22/2020 181 (A) 70 - 99 mg/dL Final   Glucose reference range applies only to samples taken after fasting for at least 8 hours.   BUN 11/22/2020 19  8 - 23 mg/dL Final   Creatinine, Ser 11/22/2020 0.88  0.61 - 1.24 mg/dL Final   Calcium 11/22/2020 9.2  8.9 - 10.3 mg/dL Final   GFR, Estimated 11/22/2020 >60  >60 mL/min Final  Comment: (NOTE) Calculated using the CKD-EPI Creatinine Equation (2021)    Anion gap 11/22/2020 8  5 - 15 Final   Performed at [facility], [place] [address] [place], [place] [address]  Hospital Outpatient Visit on 11/17/2020  Component Date Value Ref Range Status   SARS Coronavirus 2 11/17/2020 NEGATIVE  NEGATIVE Final   Comment: (NOTE) SARS-CoV-2 target nucleic acids are NOT DETECTED.  The SARS-CoV-2 RNA is generally detectable in upper and lower respiratory specimens during the acute phase of infection. Negative results do not preclude SARS-CoV-2 infection, do not rule out co-infections with other pathogens, and should not be used as  the sole basis for treatment or other patient management decisions. Negative results must be combined with clinical observations, patient history, and epidemiological information. The expected result is Negative.  Fact Sheet for Patients: SugarRoll.be  Fact Sheet for Healthcare Providers: https://www.woods-mathews.com/  This test is not yet approved or cleared by the [place] FDA and  has been authorized for detection and/or diagnosis of SARS-CoV-2 by FDA under an Emergency Use Authorization (EUA). This EUA will remain  in effect (meaning this test can be used) for the duration of the COVID-19 declaration under Se                          ction 564(b)(1) of the Act, 21 U.S.C. section 360bbb-3(b)(1), unless the authorization is terminated or revoked sooner.  Performed at [facility], [place] [address], [place], [place] [address]   Hospital Outpatient Visit on 11/13/2020  Component Date Value Ref Range Status   MRSA, PCR 11/13/2020 NEGATIVE  NEGATIVE Final   Staphylococcus aureus 11/13/2020 NEGATIVE  NEGATIVE Final   Comment: (NOTE) The Xpert SA Assay (FDA approved for NASAL specimens in patients 25 years of age and older), is one component of a comprehensive surveillance program. It is not intended to diagnose infection nor to guide or monitor treatment. Performed at [facility], [place] [address], [place], [place] [address]    WBC 11/13/2020 9.3  4.0 - 10.5 K/uL Final   RBC 11/13/2020 5.47  4.22 - 5.81 MIL/uL Final   Hemoglobin 11/13/2020 16.9  13.0 - 17.0 g/dL Final   HCT 11/13/2020 49.6  39.0 - 52.0 % Final   MCV 11/13/2020 90.7  80.0 - 100.0 fL Final   MCH 11/13/2020 30.9  26.0 - 34.0 pg Final   MCHC 11/13/2020 34.1  30.0 - 36.0 g/dL Final   RDW 11/13/2020 12.7  11.5 - 15.5 % Final   Platelets 11/13/2020 299  150 - 400 K/uL Final   nRBC 11/13/2020 0.0  0.0 - 0.2 % Final   Performed at [facility], [place] [address] [place], [place] [address]   Sodium 11/13/2020 140  135 - 145 mmol/L Final   Potassium 11/13/2020 4.9  3.5 - 5.1 mmol/L Final   Chloride 11/13/2020 104  98 - 111 mmol/L Final   CO2 11/13/2020 27  22 - 32 mmol/L Final   Glucose, Bld 11/13/2020 150 (A) 70 - 99 mg/dL Final   Glucose reference range applies only to samples taken after fasting for at least 8 hours.   BUN 11/13/2020 23  8 - 23 mg/dL Final   Creatinine, Ser 11/13/2020 1.16  0.61 - 1.24 mg/dL Final   Calcium 11/13/2020 9.8  8.9 - 10.3 mg/dL Final   Total Protein 11/13/2020 8.0  6.5 - 8.1 g/dL Final   Albumin 11/13/2020  4.8  3.5 - 5.0 g/dL Final   AST 11/13/2020 33  15 - 41 U/L Final   ALT 11/13/2020 29  0 - 44 U/L Final   Alkaline Phosphatase 11/13/2020 87  38 - 126 U/L Final   Total Bilirubin 11/13/2020 0.7  0.3 - 1.2 mg/dL Final   GFR, Estimated 11/13/2020 >60  >60 mL/min Final   Comment: (NOTE) Calculated using the CKD-EPI Creatinine Equation (2021)    Anion gap 11/13/2020 9  5 - 15 Final   Performed at [facility], [place] [address] [place], [place] [address]   ABO/RH(D) 11/13/2020 O NEG   Final   Antibody Screen 11/13/2020 NEG   Final   Sample Expiration 11/13/2020 11/24/2020,2359   Final   Extend sample reason 11/13/2020    Final                   Value:NO TRANSFUSIONS OR PREGNANCY IN THE PAST 3 MONTHS Performed at [place] [address] [place], [place] [address]    Hgb A1c MFr Bld 11/13/2020 6.1 (A) 4.8 - 5.6 % Final   Comment: (NOTE) Pre diabetes:          5.7%-6.4%  Diabetes:              >6.4%  Glycemic control for   <7.0% adults with diabetes    Mean Plasma Glucose 11/13/2020 128.37  mg/dL Final   Performed at [facility], [place] [address] [place], [place] [address]     X-Rays:No results found.  EKG: Orders placed or performed in visit on 05/31/20   EKG 12-Lead     Hospital Course: [name] is a 65 y.o. who  was admitted to [facility]. They were brought to the operating room on 11/21/2020 and underwent Procedure(s): TOTAL KNEE ARTHROPLASTY.  Patient tolerated the procedure well and was later transferred to the recovery room and then to the orthopaedic floor for postoperative care. They were given PO and IV analgesics for pain control following their surgery. They were given 24 hours of postoperative antibiotics of  Anti-infectives (From admission, onward)    Start     Dose/Rate Route Frequency Ordered Stop   11/21/20 1400  ceFAZolin (ANCEF) 2 g in sodium chloride 0.9 % 100 mL IVPB        2 g 200 mL/hr over 30 Minutes Intravenous Every 6 hours 11/21/20 1222 11/21/20 2111   11/21/20 0600  ceFAZolin (ANCEF) 2 g in sodium chloride 0.9 % 100 mL IVPB        2 g 200 mL/hr over 30 Minutes Intravenous On call to O.R. 11/21/20 [id number] 11/21/20 0731      and started on DVT prophylaxis in the form of Aspirin.   PT and OT were ordered for total joint protocol. Discharge planning consulted to help with postop disposition and equipment needs.  Patient had a good night on the evening of surgery. They started to get up OOB with therapy on POD #0. Pt was seen during rounds and was ready to go home pending progress with therapy.He worked with therapy on POD #1 and was meeting his goals. Pt was discharged to home later that day in stable condition.  Diet: Regular diet Activity: WBAT Follow-up: in 2 weeks Disposition: Home Discharged Condition: good   Discharge Instructions     Call MD / Call 911   Complete by: As directed    If you experience chest pain or shortness of  breath, CALL 911 and be transported to the hospital emergency room.  If you develope a fever above 101 F, pus (white drainage) or increased drainage or redness at the wound, or calf pain, call your surgeon's office.   Change dressing   Complete by: As directed    Maintain surgical dressing until follow up in the clinic. If the edges start  to pull up, may reinforce with tape. If the dressing is no longer working, may remove and cover with gauze and tape, but must keep the area dry and clean.  Call with any questions or concerns.   Constipation Prevention   Complete by: As directed    Drink plenty of fluids.  Prune juice may be helpful.  You may use a stool softener, such as Colace (over the counter) 100 mg twice a day.  Use MiraLax (over the counter) for constipation as needed.   Diet - low sodium heart healthy   Complete by: As directed    Increase activity slowly as tolerated   Complete by: As directed    Weight bearing as tolerated with assist device (walker, cane, etc) as directed, use it as long as suggested by your surgeon or therapist, typically at least 4-6 weeks.   Post-operative opioid taper instructions:   Complete by: As directed    POST-OPERATIVE OPIOID TAPER INSTRUCTIONS: It is important to wean off of your opioid medication as soon as possible. If you do not need pain medication after your surgery it is ok to stop day one. Opioids include: Codeine, Hydrocodone(Norco, Vicodin), Oxycodone(Percocet, oxycontin) and hydromorphone amongst others.  Long term and even short term use of opiods can cause: Increased pain response Dependence Constipation Depression Respiratory depression And more.  Withdrawal symptoms can include Flu like symptoms Nausea, vomiting And more Techniques to manage these symptoms Hydrate well Eat regular healthy meals Stay active Use relaxation techniques(deep breathing, meditating, yoga) Do Not substitute Alcohol to help with tapering If you have been on opioids for less than two weeks and do not have pain than it is ok to stop all together.  Plan to wean off of opioids This plan should start within one week post op of your joint replacement. Maintain the same interval or time between taking each dose and first decrease the dose.  Cut the total daily intake of opioids by one tablet  each day Next start to increase the time between doses. The last dose that should be eliminated is the evening dose.      TED hose   Complete by: As directed    Use stockings (TED hose) for 2 weeks on both leg(s).  You may remove them at night for sleeping.      Allergies as of 11/22/2020       Reactions   Atorvastatin    REACTION: muscle aches   Morphine And Related Itching   Repatha  [evolocumab]    Muscle pain   Simvastatin    REACTION: hand pain   Pravachol [pravastatin Sodium]    Muscle aching        Medication List     STOP taking these medications    aspirin EC 81 MG tablet Replaced by: aspirin 81 MG chewable tablet   FISH OIL PO       TAKE these medications    aspirin 81 MG chewable tablet Chew 1 tablet (81 mg total) by mouth 2 (two) times daily for 28 days. Replaces: aspirin EC 81 MG tablet   celecoxib 200  MG capsule Commonly known as: CELEBREX Take 1 capsule (200 mg total) by mouth 2 (two) times daily.   docusate sodium 100 MG capsule Commonly known as: COLACE Take 1 capsule (100 mg total) by mouth 2 (two) times daily.   fluticasone 50 MCG/ACT nasal spray Commonly known as: FLONASE SPRAY 2 SPRAYS INTO EACH NOSTRIL EVERY DAY   levocetirizine 5 MG tablet Commonly known as: Xyzal Take 1 tablet (5 mg total) by mouth every evening.   loratadine 10 MG tablet Commonly known as: CLARITIN Take 10 mg by mouth daily.   methocarbamol 500 MG tablet Commonly known as: ROBAXIN Take 1 tablet (500 mg total) by mouth every 6 (six) hours as needed for muscle spasms.   multivitamin tablet Take 1 tablet by mouth daily.   oxyCODONE 5 MG immediate release tablet Commonly known as: Oxy IR/ROXICODONE Take 1-2 tablets (5-10 mg total) by mouth every 6 (six) hours as needed for severe pain.   pantoprazole 40 MG tablet Commonly known as: PROTONIX TAKE 1 TABLET BY MOUTH EVERY DAY   polyethylene glycol 17 g packet Commonly known as: MIRALAX / GLYCOLAX Take  17 g by mouth daily as needed for mild constipation.       ASK your doctor about these medications    metoprolol tartrate 25 MG tablet Commonly known as: LOPRESSOR TAKE 1/2 TABLET BY MOUTH TWICE A DAY   Nexlizet 180-10 MG Tabs Generic drug: Bempedoic Acid-Ezetimibe TAKE 1 TABLET BY MOUTH EVERY DAY. MANUAL PA SENT 07/15/19   traZODone 50 MG tablet Commonly known as: DESYREL Take 1-2 tablets (50-100 mg total) by mouth at bedtime.               Discharge Care Instructions  (From admission, onward)           Start     Ordered   11/22/20 0000  Change dressing       Comments: Maintain surgical dressing until follow up in the clinic. If the edges start to pull up, may reinforce with tape. If the dressing is no longer working, may remove and cover with gauze and tape, but must keep the area dry and clean.  Call with any questions or concerns.   11/22/20 0820            Follow-up Information     Paralee Cancel, MD. Schedule an appointment as soon as possible for a visit in 2 week(s).   Specialty: Orthopedic Surgery Contact information: [address] [place] [address] [phone]                 Signed: Griffith Citron, PA-C Orthopedic Surgery 11/30/2020, 3:53 PM

## 2020-12-14 ENCOUNTER — Other Ambulatory Visit (HOSPITAL_COMMUNITY): Payer: Self-pay | Admitting: Orthopedic Surgery

## 2020-12-14 ENCOUNTER — Other Ambulatory Visit: Payer: Self-pay

## 2020-12-14 ENCOUNTER — Ambulatory Visit (HOSPITAL_COMMUNITY)
Admission: RE | Admit: 2020-12-14 | Discharge: 2020-12-14 | Disposition: A | Discharge status: Home / Self Care | Payer: BC Managed Care – PPO | Source: Ambulatory Visit | Attending: Cardiology | Admitting: Cardiology

## 2020-12-14 DIAGNOSIS — M79661 Pain in right lower leg: Secondary | ICD-10-CM

## 2020-12-14 DIAGNOSIS — M79605 Pain in left leg: Secondary | ICD-10-CM

## 2020-12-14 DIAGNOSIS — M79604 Pain in right leg: Secondary | ICD-10-CM

## 2021-01-17 ENCOUNTER — Telehealth: Payer: Self-pay

## 2021-01-17 MED ORDER — NEXLIZET 180-10 MG PO TABS: 1.0000 | ORAL_TABLET | Freq: Every day | ORAL | 0 refills | Status: DC

## 2021-01-17 NOTE — Telephone Encounter (Signed)
Spoke with wife.  Copay now cost prohibitive for family.  Will provide samples x 4 weeks, but advised we don't have any good options at this time.  Suggested trying copay card in January to see if reverts back to $10 after new year.  Patient currently on commercial insurance and not likely to switch to Medicare in the next year

## 2021-01-17 NOTE — Telephone Encounter (Signed)
Pts wife called in stated that even with the copay card,nexletol is $58 monthly.Tthat they cannot afford that. Routing pharmacist pool for review and advisement

## 2021-01-24 DIAGNOSIS — H5203 Hypermetropia, bilateral: Secondary | ICD-10-CM | POA: Diagnosis not present

## 2021-01-24 DIAGNOSIS — E119 Type 2 diabetes mellitus without complications: Secondary | ICD-10-CM | POA: Diagnosis not present

## 2021-01-24 DIAGNOSIS — H2513 Age-related nuclear cataract, bilateral: Secondary | ICD-10-CM | POA: Diagnosis not present

## 2021-01-24 LAB — HM DIABETES EYE EXAM

## 2021-02-16 ENCOUNTER — Encounter: Payer: Self-pay | Admitting: Internal Medicine

## 2021-03-02 ENCOUNTER — Ambulatory Visit (INDEPENDENT_AMBULATORY_CARE_PROVIDER_SITE_OTHER): Payer: BC Managed Care – PPO | Admitting: Internal Medicine

## 2021-03-02 ENCOUNTER — Other Ambulatory Visit: Payer: Self-pay

## 2021-03-02 ENCOUNTER — Encounter: Payer: Self-pay | Admitting: Internal Medicine

## 2021-03-02 VITALS — BP 130/68 | HR 68 | Temp 96.9°F | Ht 69.5 in | Wt 245.0 lb

## 2021-03-02 DIAGNOSIS — Z0000 Encounter for general adult medical examination without abnormal findings: Secondary | ICD-10-CM | POA: Diagnosis not present

## 2021-03-02 DIAGNOSIS — I2510 Atherosclerotic heart disease of native coronary artery without angina pectoris: Secondary | ICD-10-CM

## 2021-03-02 DIAGNOSIS — R7303 Prediabetes: Secondary | ICD-10-CM

## 2021-03-02 DIAGNOSIS — K219 Gastro-esophageal reflux disease without esophagitis: Secondary | ICD-10-CM | POA: Diagnosis not present

## 2021-03-02 DIAGNOSIS — I10 Essential (primary) hypertension: Secondary | ICD-10-CM | POA: Diagnosis not present

## 2021-03-02 LAB — HEPATIC FUNCTION PANEL
ALT: 24 U/L (ref 0–53)
AST: 30 U/L (ref 0–37)
Albumin: 4.5 g/dL (ref 3.5–5.2)
Alkaline Phosphatase: 87 U/L (ref 39–117)
Bilirubin, Direct: 0.2 mg/dL (ref 0.0–0.3)
Total Bilirubin: 0.7 mg/dL (ref 0.2–1.2)
Total Protein: 7.5 g/dL (ref 6.0–8.3)

## 2021-03-02 LAB — RENAL FUNCTION PANEL
Albumin: 4.5 g/dL (ref 3.5–5.2)
BUN: 17 mg/dL (ref 6–23)
CO2: 29 mEq/L (ref 19–32)
Calcium: 9.6 mg/dL (ref 8.4–10.5)
Chloride: 103 mEq/L (ref 96–112)
Creatinine, Ser: 1.05 mg/dL (ref 0.40–1.50)
GFR: 74.27 mL/min (ref >60.00)
Glucose, Bld: 121 mg/dL — ABNORMAL HIGH (ref 70–99)
Phosphorus: 2.7 mg/dL (ref 2.3–4.6)
Potassium: 4.3 mEq/L (ref 3.5–5.1)
Sodium: 139 mEq/L (ref 135–145)

## 2021-03-02 LAB — LIPID PANEL
Cholesterol: 142 mg/dL (ref 0–200)
HDL: 40.7 mg/dL (ref >39.00)
LDL Cholesterol: 76 mg/dL (ref 0–99)
NonHDL: 101.29
Total CHOL/HDL Ratio: 3
Triglycerides: 124 mg/dL (ref 0.0–149.0)
VLDL: 24.8 mg/dL (ref 0.0–40.0)

## 2021-03-02 LAB — CBC
HCT: 46.2 % (ref 39.0–52.0)
Hemoglobin: 15.6 g/dL (ref 13.0–17.0)
MCHC: 33.6 g/dL (ref 30.0–36.0)
MCV: 87.2 fl (ref 78.0–100.0)
Platelets: 263 10*3/uL (ref 150.0–400.0)
RBC: 5.3 Mil/uL (ref 4.22–5.81)
RDW: 13.3 % (ref 11.5–15.5)
WBC: 7.1 10*3/uL (ref 4.0–10.5)

## 2021-03-02 LAB — HEMOGLOBIN A1C: Hgb A1c MFr Bld: 6.2 % (ref 4.6–6.5)

## 2021-03-02 NOTE — Assessment & Plan Note (Signed)
No symptoms On ASA, metoprolol, nexlizit

## 2021-03-02 NOTE — Assessment & Plan Note (Signed)
BP Readings from Last 3 Encounters:  03/02/21 130/68  11/22/20 121/69  11/13/20 (!) 159/79   Controlled on med

## 2021-03-02 NOTE — Progress Notes (Signed)
Subjective:    Patient ID: [name], male    DOB: [birth year], 65 y.o.   MRN: [id number]  HPI Here for physical This visit occurred during the SARS-CoV-2 public health emergency.  Safety protocols were in place, including screening questions prior to the visit, additional usage of staff PPE, and extensive cleaning of exam room while observing appropriate contact time as indicated for disinfecting solutions.   Still recovering from right TKR Now walking without cane for a couple of weeks Hopes to return to work soon---but considering retirement  No heart problems Is trying to walk--but it wears him down Still going to PT twice a week Weight is up 20# in past year or so  Current Outpatient Medications on File Prior to Visit  Medication Sig Dispense Refill   aspirin EC 81 MG tablet Take 81 mg by mouth daily. Swallow whole.     celecoxib (CELEBREX) 200 MG capsule Take 1 capsule (200 mg total) by mouth 2 (two) times daily. 60 capsule 0   docusate sodium (COLACE) 100 MG capsule Take 1 capsule (100 mg total) by mouth 2 (two) times daily. 10 capsule 0   fluticasone (FLONASE) 50 MCG/ACT nasal spray SPRAY 2 SPRAYS INTO EACH NOSTRIL EVERY DAY 48 mL 3   levocetirizine (XYZAL) 5 MG tablet Take 1 tablet (5 mg total) by mouth every evening. 30 tablet 0   loratadine (CLARITIN) 10 MG tablet Take 10 mg by mouth daily.     metoprolol tartrate (LOPRESSOR) 25 MG tablet TAKE 1/2 TABLET BY MOUTH TWICE A DAY (Patient taking differently: Take 12.5 mg by mouth 2 (two) times daily.) 90 tablet 3   Multiple Vitamin (MULTIVITAMIN) tablet Take 1 tablet by mouth daily.     NEXLIZET 180-10 MG TABS TAKE 1 TABLET BY MOUTH EVERY DAY. MANUAL PA SENT 07/15/19 (Patient taking differently: Take 1 tablet by mouth daily.) 30 tablet 23   pantoprazole (PROTONIX) 40 MG tablet TAKE 1 TABLET BY MOUTH EVERY DAY 90 tablet 3   polyethylene glycol (MIRALAX / GLYCOLAX) 17 g packet Take 17 g by mouth daily as needed for mild  constipation. 14 each 0   traZODone (DESYREL) 50 MG tablet Take 1-2 tablets (50-100 mg total) by mouth at bedtime. (Patient taking differently: Take 50 mg by mouth at bedtime as needed for sleep.) 180 tablet 2   No current facility-administered medications on file prior to visit.    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   LIMA to LAD, SVG to D1, EVH via right thigh   Sleep disorder    STEMI involving left anterior descending coronary artery ([place]) 04/12/2018   Wrist fracture     6 foot fall onto head, concussion, L wrist (comminuted with intra-articular extension) and 2nd finger (middle phalanx) fxs, vertigo  Past Surgical History:  Procedure Laterality Date   BRANCHIAL CLEFT CYST EXCISION     CARDIAC CATHETERIZATION     COLONOSCOPY  04/03/2007   CORONARY ARTERY BYPASS GRAFT N/A 04/12/2018   Procedure: CORONARY ARTERY BYPASS GRAFTING (CABG) x two , using left internal mammary artery and right leg greater saphenous vein harvested endoscopically;  Surgeon: Rexene Alberts, MD;  Location: [facility];  Service: Open Heart Surgery;  Laterality: N/A;   CORONARY/GRAFT ACUTE MI REVASCULARIZATION N/A 04/12/2018   Procedure: Coronary/Graft Acute MI Revascularization;  Surgeon: Sherren Mocha, MD;  Location: [facility];  Service: Cardiovascular;  Laterality: N/A;   HEMORRHOID SURGERY     LEFT HEART CATH AND CORONARY ANGIOGRAPHY N/A  04/12/2018   Procedure: LEFT HEART CATH AND CORONARY ANGIOGRAPHY;  Surgeon: Sherren Mocha, MD;  Location: [facility];  Service: Cardiovascular;  Laterality: N/A;   RHINOPLASTY     TEE WITHOUT CARDIOVERSION N/A 04/12/2018   Procedure: TRANSESOPHAGEAL ECHOCARDIOGRAM (TEE);  Surgeon: Rexene Alberts, MD;  Location: [facility];  Service: Open Heart Surgery;  Laterality: N/A;   TOTAL KNEE ARTHROPLASTY Right 11/21/2020   Procedure: TOTAL KNEE ARTHROPLASTY;  Surgeon: Paralee Cancel, MD;  Location: [facility];  Service: Orthopedics;  Laterality: Right;    Family History  Problem Relation Age of Onset   Coronary artery disease Mother    Stroke Mother    Coronary artery disease Brother    Cancer Brother        colon or rectal   Colon cancer Brother 27       half-brother    Diabetes Neg Hx    Stomach cancer Neg Hx     Social History   Socioeconomic History   Marital status: Married    Spouse name: Designer, fashion/clothing   Number of children: 4   Years of education: Not on file   Highest education level: High school graduate  Occupational History   Occupation: installs signs    Employer: [employer]  Tobacco Use   Smoking status: Former    Years: 35.00    Types: Cigarettes    Quit date: 10/27/2005    Years since quitting: 15.3   Smokeless tobacco: Never  Vaping Use   Vaping Use: Never used  Substance and Sexual Activity   Alcohol use: Yes    Comment: once a month-beer    Drug use: No   Sexual activity: Not on file  Other Topics Concern   Not on file  Social History Narrative   Not on file   Social Determinants of Health   Financial Resource Strain: Not on file  Food Insecurity: Not on file  Transportation Needs: Not on file  Physical Activity: Not on file  Stress: Not on file  Social Connections: Not on file  Intimate Partner Violence: Not on file   Review of Systems  Constitutional:  Positive for unexpected weight change. Negative for fatigue.       Wears seat belt  HENT:   Negative for hearing loss, tinnitus and trouble swallowing.        Cracked a plate---needs repair  Eyes:  Negative for visual disturbance.       No diplopia or unilateral vision loss  Respiratory:  Negative for cough, chest tightness and shortness of breath.   Cardiovascular:  Negative for chest pain, palpitations and leg swelling.  Gastrointestinal:  Negative for blood in stool and constipation.       No heartburn on PPI  Endocrine: Negative for  polydipsia and polyuria.  Genitourinary:        Smelly urine---from MVI No sex---no problem  Musculoskeletal:  Positive for arthralgias. Negative for back pain.  Skin:  Negative for rash.  Allergic/Immunologic: Negative for environmental allergies and immunocompromised state.  Neurological:  Negative for dizziness, syncope, light-headedness and headaches.  Hematological:  Negative for adenopathy. Does not bruise/bleed easily.  Psychiatric/Behavioral:  Negative for dysphoric mood. The patient is not nervous/anxious.        Sleeps okay with trazodone      Objective:   Physical Exam Constitutional:      Appearance: Normal appearance.  HENT:     Right Ear: Tympanic membrane and ear canal normal.     Left Ear: Tympanic membrane and ear canal normal.     Mouth/Throat:     Pharynx: No oropharyngeal exudate or posterior oropharyngeal erythema.  Eyes:     Conjunctiva/sclera: Conjunctivae normal.     Pupils: Pupils are equal, round, and reactive to light.  Cardiovascular:     Rate and Rhythm: Normal rate and regular rhythm.     Pulses: Normal pulses.     Heart sounds: No murmur heard.   No gallop.  Pulmonary:     Effort: Pulmonary effort is normal.     Breath sounds: Normal breath sounds. No wheezing or rales.  Abdominal:     Palpations: Abdomen is soft.     Tenderness: There is no abdominal tenderness.  Musculoskeletal:     Cervical back: Neck supple.     Right lower leg: No edema.     Left lower leg: No edema.  Lymphadenopathy:      Cervical: No cervical adenopathy.  Skin:    Findings: No lesion or rash.  Neurological:     General: No focal deficit present.     Mental Status: He is alert and oriented to person, place, and time.  Psychiatric:        Mood and Affect: Mood normal.        Behavior: Behavior normal.           Assessment & Plan:

## 2021-03-02 NOTE — Assessment & Plan Note (Signed)
Quiet on the PPI 

## 2021-03-02 NOTE — Assessment & Plan Note (Signed)
Will recheck  Consider metformin

## 2021-03-02 NOTE — Assessment & Plan Note (Signed)
Healthy but out of shape Discussed fitness, DASH eating, etc Needs bivalent COVID vaccine Had flu vaccine Colon due 2028 Defer PSA and shingrix

## 2021-03-07 ENCOUNTER — Other Ambulatory Visit: Payer: Self-pay

## 2021-03-07 MED ORDER — METFORMIN HCL ER 500 MG PO TB24: 500.0000 mg | ORAL_TABLET | Freq: Every day | ORAL | 3 refills | Status: DC

## 2021-05-01 ENCOUNTER — Other Ambulatory Visit: Payer: Self-pay | Admitting: Internal Medicine

## 2021-06-12 ENCOUNTER — Other Ambulatory Visit: Payer: Self-pay

## 2021-06-12 ENCOUNTER — Ambulatory Visit (INDEPENDENT_AMBULATORY_CARE_PROVIDER_SITE_OTHER): Payer: No Typology Code available for payment source | Admitting: Cardiovascular Disease

## 2021-06-12 ENCOUNTER — Encounter: Payer: Self-pay | Admitting: Cardiovascular Disease

## 2021-06-12 VITALS — BP 124/76 | Ht 70.0 in | Wt 248.8 lb

## 2021-06-12 DIAGNOSIS — I2510 Atherosclerotic heart disease of native coronary artery without angina pectoris: Secondary | ICD-10-CM

## 2021-06-12 DIAGNOSIS — I10 Essential (primary) hypertension: Secondary | ICD-10-CM | POA: Diagnosis not present

## 2021-06-12 DIAGNOSIS — E785 Hyperlipidemia, unspecified: Secondary | ICD-10-CM | POA: Diagnosis not present

## 2021-06-12 DIAGNOSIS — I4510 Unspecified right bundle-branch block: Secondary | ICD-10-CM | POA: Diagnosis not present

## 2021-06-12 NOTE — Assessment & Plan Note (Signed)
History of CAD status post STEMI 04/12/2018.  He underwent cardiac catheterization by Dr. Burt Knack revealing a "true ostial" LAD lesion not favorable for percutaneous intervention.  He underwent emergency CABG by Dr. Roxy Manns with a LIMA to his LAD and a vein to diagonal branch.  He is currently asymptomatic.

## 2021-06-12 NOTE — Patient Instructions (Signed)

## 2021-06-12 NOTE — Assessment & Plan Note (Signed)
Chronic. 

## 2021-06-12 NOTE — Assessment & Plan Note (Signed)
History of hyperlipidemia intolerant to statin therapy and Repatha currently on NEXLIZET with a lipid profile performed 03/02/2021 revealing total cholesterol 142, LDL 76 and HDL 40.

## 2021-06-12 NOTE — Progress Notes (Signed)
06/12/2021 [name]   Sep 10, 1955  [id number]  Primary Physician [name] Carbon, MD Primary Cardiologist: [name] Harp MD [name], [place]  HPI:  [name] is a 66 y.o.  mildly overweight married Caucasian male father of 53, grandfather 3 grandchildren who his brother-in-law [name], s also a patient of mine.  He previously saw [name].  He transferred to my service.    I last saw him in the office 05/31/2020.  He is accompanied by his wife [name]  today.  He works putting up signs.  His risk factors include remote tobacco abuse having quit 14 years ago, family history of heart disease with a mother who had myocardial infarction at age 40, hyperlipidemia and hypertension.  He had a STEMI 04/12/2018 and underwent cardiac catheterization emergently by [name] revealing true ostial LAD disease not favorable for percutaneous intervention.  He underwent emergency CABG by [name] with a LIMA to his LAD and a vein to the diagonal branch.  He is currently asymptomatic and walks 7 miles a [name] on the weekends.  He does have hyperlipidemia intolerant to statin therapy.  He was also started on Repatha which she could not tolerate as well.   Since I saw him a year ago he has done well.  He was intolerant to statins and Repatha and was placed on NEXLIZET.  He did have right total knee replacement by [name] and 11/21/2020 which she is now recuperating from.  As result he has not walked as much as he did a year ago and has gained 17 pounds.  He does feel somewhat lethargic.  He denies chest pain or shortness of breath.    No outpatient medications have been marked as taking for the 06/12/21 encounter (Office Visit) with [name] Harp, MD.     Allergies  Allergen Reactions   Atorvastatin     REACTION: muscle aches   Morphine And Related Itching   Repatha  [Evolocumab]     Muscle pain   Simvastatin     REACTION: hand pain   Pravachol [Pravastatin Sodium]      Muscle aching    Social History   Socioeconomic History   Marital status: Married    Spouse name: [name], [name]   Number of children: 4   Years of education: Not on file   Highest education level: High school graduate  Occupational History   Occupation: installs signs    Employer: [employer]  Tobacco Use   Smoking status: Former    Years: 35.00    Types: Cigarettes    Quit date: 10/27/2005    Years since quitting: 15.6   Smokeless tobacco: Never  Vaping Use   Vaping Use: Never used  Substance and Sexual Activity   Alcohol use: Yes    Comment: once a month-beer    Drug use: No   Sexual activity: Not on file  Other Topics Concern   Not on file  Social History Narrative   Not on file   Social Determinants of Health   Financial Resource Strain: Not on file  Food Insecurity: Not on file  Transportation Needs: Not on file  Physical Activity: Not on file  Stress: Not on file  Social Connections: Not on file  Intimate Partner Violence: Not on file     Review of Systems: General: negative for chills, fever, night sweats or weight changes.  Cardiovascular: negative for chest pain, dyspnea on exertion, edema, orthopnea, palpitations, paroxysmal  nocturnal dyspnea or shortness of breath Dermatological: negative for rash Respiratory: negative for cough or wheezing Urologic: negative for hematuria Abdominal: negative for nausea, vomiting, diarrhea, bright red blood per rectum, melena, or hematemesis Neurologic: negative for visual changes, syncope, or dizziness All other systems reviewed and are otherwise negative except as noted above.    Blood pressure 124/76, height 5\' 10"  (1.778 m), weight 248 lb 12.8 oz (112.9 kg), SpO2 97 %.  General appearance: alert and no distress Neck: no adenopathy, no carotid bruit, no JVD, supple, symmetrical, trachea midline, and thyroid not enlarged, symmetric, no tenderness/mass/nodules Lungs: clear to auscultation bilaterally Heart:  regular rate and rhythm, S1, S2 normal, no murmur, click, rub or gallop Extremities: extremities normal, atraumatic, no cyanosis or edema Pulses: 2+ and symmetric Skin: Skin color, texture, turgor normal. No rashes or lesions Neurologic: Grossly normal  EKG sinus rhythm at 68 with right bundle branch block, left axis deviation and occasional PVCs.  I personally reviewed this EKG.  ASSESSMENT AND PLAN:   Hyperlipemia History of hyperlipidemia intolerant to statin therapy and Repatha currently on NEXLIZET with a lipid profile performed 03/02/2021 revealing total cholesterol 142, LDL 76 and HDL 40.  Essential hypertension, benign History of essential hypertension a blood pressure measured today at 124/76.  He is on metoprolol.  CAD (coronary artery disease) History of CAD status post STEMI 04/12/2018.  He underwent cardiac catheterization by [name] revealing a "true ostial" LAD lesion not favorable for percutaneous intervention.  He underwent emergency CABG by [name] with a LIMA to his LAD and a vein to diagonal branch.  He is currently asymptomatic.  RBBB Chronic     [name] Harp MD FACP,FACC,FAHA, [facility] 06/12/2021 10:45 AM

## 2021-06-12 NOTE — Assessment & Plan Note (Signed)
History of essential hypertension a blood pressure measured today at 124/76.  He is on metoprolol.

## 2021-06-13 ENCOUNTER — Other Ambulatory Visit: Payer: Self-pay | Admitting: Cardiovascular Disease

## 2021-07-09 ENCOUNTER — Other Ambulatory Visit: Payer: Self-pay | Admitting: Cardiovascular Disease

## 2021-07-10 ENCOUNTER — Other Ambulatory Visit: Payer: Self-pay | Admitting: Cardiovascular Disease

## 2021-07-11 ENCOUNTER — Other Ambulatory Visit: Payer: Self-pay

## 2021-07-11 MED ORDER — NEXLIZET 180-10 MG PO TABS: 1.0000 | ORAL_TABLET | Freq: Every day | ORAL | 3 refills | Status: DC

## 2021-08-07 ENCOUNTER — Telehealth: Payer: Self-pay | Admitting: Pharmacist

## 2021-08-07 NOTE — Telephone Encounter (Signed)
Patient's wife called. Patient has new insurance plan and needs PA updated for Nexlizet.  PA submitted.  Key: [id number] ?

## 2021-08-21 NOTE — Telephone Encounter (Signed)
Called and spoke to pts wife and stated the nexlizet was approved and instructed to call back if they have concerns ?

## 2021-08-21 NOTE — Telephone Encounter (Signed)
Patient's Nexlizet approved ?

## 2021-10-14 ENCOUNTER — Other Ambulatory Visit: Payer: Self-pay | Admitting: Internal Medicine

## 2022-03-12 ENCOUNTER — Encounter: Payer: Self-pay | Admitting: Internal Medicine

## 2022-03-12 ENCOUNTER — Ambulatory Visit (INDEPENDENT_AMBULATORY_CARE_PROVIDER_SITE_OTHER): Payer: No Typology Code available for payment source | Admitting: Internal Medicine

## 2022-03-12 VITALS — BP 124/70 | HR 60 | Temp 97.6°F | Ht 69.5 in | Wt 234.0 lb

## 2022-03-12 DIAGNOSIS — K219 Gastro-esophageal reflux disease without esophagitis: Secondary | ICD-10-CM

## 2022-03-12 DIAGNOSIS — I2510 Atherosclerotic heart disease of native coronary artery without angina pectoris: Secondary | ICD-10-CM | POA: Diagnosis not present

## 2022-03-12 DIAGNOSIS — R7303 Prediabetes: Secondary | ICD-10-CM

## 2022-03-12 DIAGNOSIS — Z0000 Encounter for general adult medical examination without abnormal findings: Secondary | ICD-10-CM | POA: Diagnosis not present

## 2022-03-12 LAB — CBC
HCT: 46.1 % (ref 39.0–52.0)
Hemoglobin: 15.3 g/dL (ref 13.0–17.0)
MCHC: 33.2 g/dL (ref 30.0–36.0)
MCV: 90 fl (ref 78.0–100.0)
Platelets: 269 10*3/uL (ref 150.0–400.0)
RBC: 5.12 Mil/uL (ref 4.22–5.81)
RDW: 13.1 % (ref 11.5–15.5)
WBC: 6.8 10*3/uL (ref 4.0–10.5)

## 2022-03-12 LAB — LIPID PANEL
Cholesterol: 140 mg/dL (ref 0–200)
HDL: 43.5 mg/dL (ref >39.00)
LDL Cholesterol: 77 mg/dL (ref 0–99)
NonHDL: 96.66
Total CHOL/HDL Ratio: 3
Triglycerides: 99 mg/dL (ref 0.0–149.0)
VLDL: 19.8 mg/dL (ref 0.0–40.0)

## 2022-03-12 LAB — COMPREHENSIVE METABOLIC PANEL
ALT: 22 U/L (ref 0–53)
AST: 24 U/L (ref 0–37)
Albumin: 4.4 g/dL (ref 3.5–5.2)
Alkaline Phosphatase: 83 U/L (ref 39–117)
BUN: 17 mg/dL (ref 6–23)
CO2: 30 mEq/L (ref 19–32)
Calcium: 9.3 mg/dL (ref 8.4–10.5)
Chloride: 103 mEq/L (ref 96–112)
Creatinine, Ser: 1.05 mg/dL (ref 0.40–1.50)
GFR: 73.74 mL/min (ref >60.00)
Glucose, Bld: 112 mg/dL — ABNORMAL HIGH (ref 70–99)
Potassium: 4.2 mEq/L (ref 3.5–5.1)
Sodium: 138 mEq/L (ref 135–145)
Total Bilirubin: 0.7 mg/dL (ref 0.2–1.2)
Total Protein: 7.3 g/dL (ref 6.0–8.3)

## 2022-03-12 LAB — HEMOGLOBIN A1C: Hgb A1c MFr Bld: 6.4 % (ref 4.6–6.5)

## 2022-03-12 NOTE — Progress Notes (Signed)
Hearing Screening - Comments:: Passed whisper test Vision Screening - Comments:: October 2023

## 2022-03-12 NOTE — Assessment & Plan Note (Signed)
No symptoms on ASA 81, bmpedoic acid/exetimibe 180/10, metoprolol 12.5 bid

## 2022-03-12 NOTE — Assessment & Plan Note (Addendum)
Hopefully still okay  Stopped the metformin If worse, consider glipizide

## 2022-03-12 NOTE — Assessment & Plan Note (Signed)
Quiet on daily pantoprazole

## 2022-03-12 NOTE — Assessment & Plan Note (Signed)
Heathy Eating a little better---walks a lot Colon due 2028 Prefers no PSA or COVID vaccine Not sure about shingrix Had flu vaccine

## 2022-03-12 NOTE — Progress Notes (Signed)
Subjective:    Patient ID: [name], male    DOB: Nov 29, 1955, 66 y.o.   MRN: [id number]  HPI Here for physical Did return to work  Doing okay Trying to eat better---has lost 10# Does walk a lot at work---- 14-15K steps  No heart problems  Current Outpatient Medications on File Prior to Visit  Medication Sig Dispense Refill   aspirin EC 81 MG tablet Take 81 mg by mouth daily. Swallow whole.     Bempedoic Acid-Ezetimibe (NEXLIZET) 180-10 MG TABS Take 1 tablet by mouth daily. 90 tablet 3   fluticasone (FLONASE) 50 MCG/ACT nasal spray SPRAY 2 SPRAYS INTO EACH NOSTRIL EVERY DAY 48 mL 3   loratadine (CLARITIN) 10 MG tablet Take 10 mg by mouth daily.     metFORMIN (GLUCOPHAGE-XR) 500 MG 24 hr tablet Take 1 tablet (500 mg total) by mouth daily with breakfast. 90 tablet 3   metoprolol tartrate (LOPRESSOR) 25 MG tablet Take 0.5 tablets (12.5 mg total) by mouth 2 (two) times daily. 90 tablet 3   Multiple Vitamin (MULTIVITAMIN) tablet Take 1 tablet by mouth daily.     pantoprazole (PROTONIX) 40 MG tablet TAKE 1 TABLET BY MOUTH EVERY DAY 90 tablet 3   traZODone (DESYREL) 50 MG tablet TAKE 1-2 TABLETS BY MOUTH AT BEDTIME. 180 tablet 3   No current facility-administered medications on file prior to visit.    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   LIMA to LAD, SVG to D1, EVH via right thigh   Sleep disorder    STEMI involving left  anterior descending coronary artery ([place]) 04/12/2018   Wrist fracture     6 foot fall onto head, concussion, L wrist (comminuted with intra-articular extension) and 2nd finger (middle phalanx) fxs, vertigo    Past Surgical History:  Procedure Laterality Date   BRANCHIAL CLEFT CYST EXCISION     CARDIAC CATHETERIZATION     COLONOSCOPY  04/03/2007   CORONARY ARTERY BYPASS GRAFT N/A 04/12/2018   Procedure: CORONARY ARTERY BYPASS GRAFTING (CABG) x two , using left internal mammary artery and right leg greater saphenous vein harvested endoscopically;  Surgeon: Rexene Alberts, MD;  Location: [facility];  Service: Open Heart Surgery;  Laterality: N/A;   CORONARY/GRAFT ACUTE MI REVASCULARIZATION N/A 04/12/2018   Procedure: Coronary/Graft Acute MI Revascularization;  Surgeon: Sherren Mocha, MD;  Location: [facility];  Service: Cardiovascular;  Laterality: N/A;   HEMORRHOID SURGERY     LEFT HEART CATH AND CORONARY ANGIOGRAPHY N/A 04/12/2018   Procedure: LEFT HEART CATH AND CORONARY ANGIOGRAPHY;  Surgeon: Sherren Mocha, MD;  Location: [facility];  Service: Cardiovascular;  Laterality: N/A;   RHINOPLASTY     TEE WITHOUT CARDIOVERSION N/A 04/12/2018   Procedure: TRANSESOPHAGEAL ECHOCARDIOGRAM (TEE);  Surgeon: Darylene Price  H, MD;  Location: [facility];  Service: Open Heart Surgery;  Laterality: N/A;   TOTAL KNEE ARTHROPLASTY Right 11/21/2020   Procedure: TOTAL KNEE ARTHROPLASTY;  Surgeon: Paralee Cancel, MD;  Location: [facility];  Service: Orthopedics;  Laterality: Right;    Family History  Problem Relation Age of Onset   Coronary artery disease Mother    Stroke Mother    Coronary artery disease Brother    Cancer Brother        colon or rectal   Colon cancer Brother 85       half-brother    Diabetes Neg Hx    Stomach cancer Neg Hx     Social History   Socioeconomic History   Marital status: Married    Spouse name: Designer, fashion/clothing   Number of children: 4   Years of education: Not on  file   Highest education level: High school graduate  Occupational History   Occupation: installs signs    Employer: [employer]  Tobacco Use   Smoking status: Former    Years: 35.00    Types: Cigarettes    Quit date: 10/27/2005    Years since quitting: 16.3   Smokeless tobacco: Never  Vaping Use   Vaping Use: Never used  Substance and Sexual Activity   Alcohol use: Yes    Comment: once a month-beer    Drug use: No   Sexual activity: Not on file  Other Topics Concern   Not on file  Social History Narrative   Not on file   Social Determinants of Health   Financial Resource Strain: Low Risk  (06/11/2018)   Overall Financial Resource Strain (CARDIA)    Difficulty of Paying Living Expenses: Not hard at all  Food Insecurity: No Food Insecurity (06/11/2018)   Hunger Vital Sign    Worried About Running Out of Food in the Last Year: Never true    [place] in the Last Year: Never true  Transportation Needs: No Transportation Needs (06/11/2018)   PRAPARE - Hydrologist (Medical): No    Lack of Transportation (Non-Medical): No  Physical Activity: Sufficiently Active (06/11/2018)   Exercise Vital Sign    Days of Exercise per Week: 5 days    Minutes of Exercise per Session: 30 min  Stress: No Stress Concern Present (06/11/2018)   [place]    Feeling of Stress : Not at all  Social Connections: Not on file  Intimate Partner Violence: Not on file   Review of Systems  Constitutional:  Negative for fatigue.       Wears seat belt  HENT:  Negative for dental problem and tinnitus.        Keeps up with dentist  Eyes:  Negative for visual disturbance.       No diplopia or unilateral vision loss  Respiratory:  Negative for cough, chest tightness and shortness of breath.   Cardiovascular:  Negative for chest pain and leg swelling.       Occ skips--no racing  Gastrointestinal:  Negative  for blood in stool and constipation.       No heartburn  Endocrine: Negative for polydipsia and polyuria.  Genitourinary:  Negative for difficulty urinating.       No sexual problems  Musculoskeletal:  Negative for back pain and joint swelling.       Hand arthritis ---stiff in AM and loosens up Uses tylenol prn  Skin:  Wife wonders about some redness on his forehead  Allergic/Immunologic: Positive for environmental allergies. Negative for immunocompromised state.       Okay with flonase and loratadine  Neurological:  Negative for dizziness, syncope, light-headedness and headaches.  Hematological:  Negative for adenopathy. Bruises/bleeds easily.  Psychiatric/Behavioral:  Negative for dysphoric mood and sleep disturbance. The patient is not nervous/anxious.        Objective:   Physical Exam Constitutional:      Appearance: Normal appearance.  HENT:     Mouth/Throat:     Pharynx: No oropharyngeal exudate or posterior oropharyngeal erythema.  Eyes:     Conjunctiva/sclera: Conjunctivae normal.     Pupils: Pupils are equal, round, and reactive to light.  Cardiovascular:     Rate and Rhythm: Normal rate and regular rhythm.     Pulses: Normal pulses.     Heart sounds: No murmur heard.    No gallop.  Pulmonary:     Effort: Pulmonary effort is normal.     Breath sounds: Normal breath sounds. No wheezing or rales.  Abdominal:     Palpations: Abdomen is soft.     Tenderness: There is no abdominal tenderness.  Musculoskeletal:     Cervical back: Neck supple.     Right lower leg: No edema.     Left lower leg: No edema.  Lymphadenopathy:     Cervical: No cervical adenopathy.  Skin:    Findings: No lesion or rash.     Comments: Slight keratoses on forehead--nothing worrisome  Neurological:     General: No focal deficit present.     Mental Status: He is alert and oriented to person, place, and time.  Psychiatric:        Mood and Affect: Mood normal.        Behavior: Behavior  normal.            Assessment & Plan:

## 2022-04-02 ENCOUNTER — Telehealth: Payer: Self-pay | Admitting: Internal Medicine

## 2022-04-02 NOTE — Telephone Encounter (Signed)
Patient wife Scarlet called in and stated he seen Dr. Silvio Pate for a physical back in November and some Amoxicillin was suppose to be called in for him. He is having a Root canal tomorrow morning at 10 and need the medication before his appointment. Please advise.

## 2022-04-03 MED ORDER — AMOXICILLIN 500 MG PO TABS: 2000.0000 mg | ORAL_TABLET | Freq: Once | ORAL | 0 refills | Status: DC

## 2022-04-03 NOTE — Telephone Encounter (Signed)
Please let them know I sent the amoxicillin

## 2022-04-03 NOTE — Telephone Encounter (Signed)
Spoke to pt. He got it at 8am when they opened.

## 2022-04-03 NOTE — Addendum Note (Signed)
Addended by: Viviana Simpler I on: 04/03/2022 07:35 AM   Modules accepted: Orders

## 2022-04-17 ENCOUNTER — Telehealth: Payer: Self-pay | Admitting: Internal Medicine

## 2022-04-17 NOTE — Telephone Encounter (Signed)
Pt wife called in requesting a call back stated pt was billed for his physical labs . Wants to know if labs is part of his physical . Please advise .# [phone] . Stated she spoke with her insurance and billing

## 2022-05-21 ENCOUNTER — Encounter: Payer: Self-pay | Admitting: Cardiovascular Disease

## 2022-05-21 ENCOUNTER — Ambulatory Visit
Payer: No Typology Code available for payment source | Attending: Cardiovascular Disease | Admitting: Cardiovascular Disease

## 2022-05-21 VITALS — BP 140/64 | HR 66 | Ht 70.0 in | Wt 238.4 lb

## 2022-05-21 DIAGNOSIS — E782 Mixed hyperlipidemia: Secondary | ICD-10-CM

## 2022-05-21 DIAGNOSIS — I4510 Unspecified right bundle-branch block: Secondary | ICD-10-CM

## 2022-05-21 DIAGNOSIS — I2510 Atherosclerotic heart disease of native coronary artery without angina pectoris: Secondary | ICD-10-CM

## 2022-05-21 DIAGNOSIS — I10 Essential (primary) hypertension: Secondary | ICD-10-CM

## 2022-05-21 NOTE — Assessment & Plan Note (Signed)
History of hyperlipidemia on Nezlizet with lipid profile performed 03/12/2022 revealing total cholesterol 140, LDL 77 and HDL 43.

## 2022-05-21 NOTE — Assessment & Plan Note (Signed)
History of essential hypertension blood pressure measured today at 140/64.  He is on metoprolol.

## 2022-05-21 NOTE — Patient Instructions (Signed)
Medication Instructions:  Your physician recommends that you continue on your current medications as directed. Please refer to the Current Medication list given to you today.  *If you need a refill on your cardiac medications before your next appointment, please call your pharmacy*   Testing/Procedures: Your physician has requested that you have a lexiscan myoview. For further information please visit [email or web address]. Please follow instruction sheet, as given. This will take place at [address], [address]  How to prepare for your Myocardial Perfusion Test: Do not eat or drink 3 hours prior to your test, except you may have water. Do not consume products containing caffeine (regular or decaffeinated) 12 hours prior to your test. (ex: coffee, chocolate, sodas, tea). Do bring a list of your current medications with you.  If not listed below, you may take your medications as normal. Do wear comfortable clothes (no dresses or overalls) and walking shoes, tennis shoes preferred (No heels or open toe shoes are allowed). Do NOT wear cologne, perfume, aftershave, or lotions (deodorant is allowed). The test will take approximately 3 to 4 hours to complete If these instructions are not followed, your test will have to be rescheduled.    Follow-Up: At [facility], you and your health needs are our priority.  As part of our continuing mission to provide you with exceptional heart care, we have created designated Provider Care Teams.  These Care Teams include your primary Cardiologist (physician) and Advanced Practice Providers (APPs -  Physician Assistants and Nurse Practitioners) who all work together to provide you with the care you need, when you need it.  We recommend signing up for the patient portal called "MyChart".  Sign up information is provided on this After Visit Summary.  MyChart is used to connect with patients for Virtual Visits (Telemedicine).  Patients are able to view  lab/test results, encounter notes, upcoming appointments, etc.  Non-urgent messages can be sent to your provider as well.   To learn more about what you can do with MyChart, go to [email or web address].    Your next appointment:   12 month(s)  Provider:   Quay Burow, MD

## 2022-05-21 NOTE — Progress Notes (Signed)
05/21/2022 [name]   1956-01-27  [id number]  Primary Physician Venia Carbon, MD Primary Cardiologist: Lorretta Harp MD [name], [place]  HPI:  [name] is a 67 y.o.  mildly overweight married Caucasian male father of 44, grandfather 3 grandchildren who his brother-in-law [name], s also a patient of mine.  He previously saw Dr. Margaretann [name].  He transferred to my service.    I last saw him in the office 06/12/2021.    He works putting up signs.  His risk factors include remote tobacco abuse having quit 14 years ago, family history of heart disease with a mother who had myocardial infarction at age 77, hyperlipidemia and hypertension.  He had a STEMI 04/12/2018 and underwent cardiac catheterization emergently by Dr. Burt [name] revealing true ostial LAD disease not favorable for percutaneous intervention.  He underwent emergency CABG by Dr. Roxy [name] with a LIMA to his LAD and a vein to the diagonal branch.  He is currently asymptomatic and walks 7 miles a [name] on the weekends.  He does have hyperlipidemia intolerant to statin therapy.  He was also started on Repatha which she could not tolerate as well.   He was intolerant to statins and Repatha and was placed on NEXLIZET.  He did have right total knee replacement by Dr. Delrae [name] and 11/21/2020 which she is now recuperating from.  As result he has not walked as much as he did a year ago and has gained 17 pounds.  He does feel somewhat lethargic.  Since I saw him a year ago he is remained stable.  He denies chest pain or shortness of breath.   Current Meds  Medication Sig   aspirin EC 81 MG tablet Take 81 mg by mouth daily. Swallow whole.   Bempedoic Acid-Ezetimibe (NEXLIZET) 180-10 MG TABS Take 1 tablet by mouth daily.   fluticasone (FLONASE) 50 MCG/ACT nasal spray SPRAY 2 SPRAYS INTO EACH NOSTRIL EVERY [name]   loratadine (CLARITIN) 10 MG tablet Take 10 mg by mouth daily.   metoprolol tartrate (LOPRESSOR) 25 MG tablet Take 0.5  tablets (12.5 mg total) by mouth 2 (two) times daily.   Multiple Vitamin (MULTIVITAMIN) tablet Take 1 tablet by mouth daily.   Omega-3 Fatty Acids (FISH OIL) 1000 MG CAPS Take by mouth.   pantoprazole (PROTONIX) 40 MG tablet TAKE 1 TABLET BY MOUTH EVERY [name]   traZODone (DESYREL) 50 MG tablet TAKE 1-2 TABLETS BY MOUTH AT BEDTIME.     Allergies  Allergen Reactions   Atorvastatin     REACTION: muscle aches   Morphine And Related Itching   Repatha  [Evolocumab]     Muscle pain   Simvastatin     REACTION: hand pain   Pravachol [Pravastatin Sodium]     Muscle aching    Social History   Socioeconomic History   Marital status: Married    Spouse name: Designer, fashion/clothing   Number of children: 4   Years of education: Not on file   Highest education level: High school graduate  Occupational History   Occupation: installs signs    Employer: [employer]  Tobacco Use   Smoking status: Former    Years: 35.00    Types: Cigarettes    Quit date: 10/27/2005    Years since quitting: 16.5   Smokeless tobacco: Never  Vaping Use   Vaping Use: Never used  Substance and Sexual Activity   Alcohol use: Yes    Comment: once a month-beer  Drug use: No   Sexual activity: Not on file  Other Topics Concern   Not on file  Social History Narrative   No living will   Wife should be health care POA---alternate is son   Would accept resuscitation   Not sure about tube feeds   Social Determinants of Health   Financial Resource Strain: Low Risk  (06/11/2018)   Overall Financial Resource Strain (CARDIA)    Difficulty of Paying Living Expenses: Not hard at all  Food Insecurity: No Food Insecurity (06/11/2018)   Hunger Vital Sign    Worried About Running Out of Food in the Last Year: Never true    [place] in the Last Year: Never true  Transportation Needs: No Transportation Needs (06/11/2018)   PRAPARE - Hydrologist (Medical): No    Lack of Transportation  (Non-Medical): No  Physical Activity: Sufficiently Active (06/11/2018)   Exercise Vital Sign    Days of Exercise per Week: 5 days    Minutes of Exercise per Session: 30 min  Stress: No Stress Concern Present (06/11/2018)   [place]    Feeling of Stress : Not at all  Social Connections: Not on file  Intimate Partner Violence: Not on file     Review of Systems: General: negative for chills, fever, night sweats or weight changes.  Cardiovascular: negative for chest pain, dyspnea on exertion, edema, orthopnea, palpitations, paroxysmal nocturnal dyspnea or shortness of breath Dermatological: negative for rash Respiratory: negative for cough or wheezing Urologic: negative for hematuria Abdominal: negative for nausea, vomiting, diarrhea, bright red blood per rectum, melena, or hematemesis Neurologic: negative for visual changes, syncope, or dizziness All other systems reviewed and are otherwise negative except as noted above.    Blood pressure (!) 140/64, pulse 66, height '5\' 10"'$  (1.778 m), weight 238 lb 6.4 oz (108.1 kg), SpO2 97 %.  General appearance: alert and no distress Neck: no adenopathy, no carotid bruit, no JVD, supple, symmetrical, trachea midline, and thyroid not enlarged, symmetric, no tenderness/mass/nodules Lungs: clear to auscultation bilaterally Heart: regular rate and rhythm, S1, S2 normal, no murmur, click, rub or gallop Extremities: extremities normal, atraumatic, no cyanosis or edema Pulses: 2+ and symmetric Skin: Skin color, texture, turgor normal. No rashes or lesions Neurologic: Grossly normal  EKG sinus rhythm at 66 with right bundle branch block/left anterior fascicular block (bifascicular block).  Did have septal Q waves.  Personally reviewed this EKG.  ASSESSMENT AND PLAN:   Hyperlipemia History of hyperlipidemia on Nezlizet with lipid profile performed 03/12/2022 revealing total cholesterol  140, LDL 77 and HDL 43.    Essential hypertension, benign History of essential hypertension blood pressure measured today at 140/64.  He is on metoprolol.  CAD (coronary artery disease) History of CAD status post STEMI 04/12/2018.  He underwent cardiac catheterization by Dr. Burt [name] revealing a true ostial LAD lesion not amenable for percutaneous intervention.  He ultimately underwent CABG by Dr. Roxy [name] with a LIMA to his LAD and a vein to the diagonal branch.  He has done well since.  He denies chest pain or shortness of breath.  RBBB Chronic     Lorretta Harp MD [facility], [facility] 05/21/2022 12:06 PM

## 2022-05-21 NOTE — Assessment & Plan Note (Signed)
Chronic. 

## 2022-05-21 NOTE — Assessment & Plan Note (Signed)
History of CAD status post STEMI 04/12/2018.  He underwent cardiac catheterization by Dr. Burt Knack revealing a true ostial LAD lesion not amenable for percutaneous intervention.  He ultimately underwent CABG by Dr. Roxy Manns with a LIMA to his LAD and a vein to the diagonal branch.  He has done well since.  He denies chest pain or shortness of breath.

## 2022-05-29 ENCOUNTER — Telehealth (HOSPITAL_COMMUNITY): Payer: Self-pay | Admitting: *Deleted

## 2022-05-29 NOTE — Telephone Encounter (Signed)
Pt reached and given instructions for MPI study on 05/30/22.

## 2022-05-30 ENCOUNTER — Ambulatory Visit (HOSPITAL_COMMUNITY): Payer: No Typology Code available for payment source | Attending: Cardiovascular Disease

## 2022-05-30 DIAGNOSIS — I10 Essential (primary) hypertension: Secondary | ICD-10-CM | POA: Insufficient documentation

## 2022-05-30 DIAGNOSIS — I2510 Atherosclerotic heart disease of native coronary artery without angina pectoris: Secondary | ICD-10-CM | POA: Diagnosis present

## 2022-05-30 DIAGNOSIS — I4510 Unspecified right bundle-branch block: Secondary | ICD-10-CM | POA: Diagnosis present

## 2022-05-30 DIAGNOSIS — E782 Mixed hyperlipidemia: Secondary | ICD-10-CM | POA: Insufficient documentation

## 2022-05-30 LAB — MYOCARDIAL PERFUSION IMAGING
LV dias vol: 93 mL (ref 62–150)
LV sys vol: 34 mL
Nuc Stress EF: 63 %
Rest Nuclear Isotope Dose: 10.9 mCi
SDS: 1
SRS: 0
SSS: 1
ST Depression (mm): 0 mm
Stress Nuclear Isotope Dose: 31.6 mCi
TID: 0.99

## 2022-05-30 MED ORDER — TECHNETIUM TC 99M TETROFOSMIN IV KIT
10.9000 | PACK | Freq: Once | INTRAVENOUS | Status: AC | PRN
  Administered 2022-05-30: 10.9 via INTRAVENOUS

## 2022-05-30 MED ORDER — TECHNETIUM TC 99M TETROFOSMIN IV KIT
31.6000 | PACK | Freq: Once | INTRAVENOUS | Status: AC | PRN
  Administered 2022-05-30: 31.6 via INTRAVENOUS

## 2022-05-30 MED ORDER — REGADENOSON 0.4 MG/5ML IV SOLN
0.4000 mg | Freq: Once | INTRAVENOUS | Status: AC
  Administered 2022-05-30: 0.4 mg via INTRAVENOUS

## 2022-06-05 ENCOUNTER — Other Ambulatory Visit: Payer: Self-pay | Admitting: Cardiovascular Disease

## 2022-06-06 ENCOUNTER — Encounter: Payer: Self-pay | Admitting: Internal Medicine

## 2022-06-06 ENCOUNTER — Telehealth: Payer: Self-pay | Admitting: Cardiovascular Disease

## 2022-06-06 ENCOUNTER — Other Ambulatory Visit: Payer: Self-pay | Admitting: Internal Medicine

## 2022-06-06 MED ORDER — FLUTICASONE PROPIONATE 50 MCG/ACT NA SUSP: NASAL | 3 refills | Status: DC

## 2022-06-06 MED ORDER — TRAZODONE HCL 50 MG PO TABS: 50.0000 mg | ORAL_TABLET | Freq: Every day | ORAL | 3 refills | Status: AC

## 2022-06-06 NOTE — Telephone Encounter (Signed)
Wife called to follow-up on patient's Stress test results.  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

## 2022-06-06 NOTE — Telephone Encounter (Signed)
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

## 2022-06-11 ENCOUNTER — Ambulatory Visit: Payer: No Typology Code available for payment source | Admitting: Cardiovascular Disease

## 2022-06-19 NOTE — Telephone Encounter (Signed)
Spoke with pt's wife, Designer, fashion/clothing (ok per DPR) regarding DOT letter and results of stress test. Will place at the front desk and she will send her husband to pick up at his convenience. Wife verbalizes understanding.

## 2022-07-15 ENCOUNTER — Telehealth: Payer: Self-pay | Admitting: Cardiovascular Disease

## 2022-07-15 ENCOUNTER — Other Ambulatory Visit: Payer: Self-pay | Admitting: Cardiovascular Disease

## 2022-07-15 NOTE — Telephone Encounter (Signed)
*  STAT* If patient is at the pharmacy, call can be transferred to refill team.   1. Which medications need to be refilled? (please list name of each medication and dose if known) Bempedoic Acid-Ezetimibe (NEXLIZET) 180-10 MG TABS   2. Which pharmacy/location (including street and city if local pharmacy) is medication to be sent to?  [id number] - [place], [place] - [address]    3. Do they need a 30 day or 90 day supply? 90   Patient only has a couple of days left of this med.

## 2022-07-17 MED ORDER — NEXLIZET 180-10 MG PO TABS: 1.0000 | ORAL_TABLET | Freq: Every day | ORAL | 3 refills | Status: DC

## 2022-07-18 ENCOUNTER — Ambulatory Visit (INDEPENDENT_AMBULATORY_CARE_PROVIDER_SITE_OTHER): Payer: No Typology Code available for payment source | Admitting: Internal Medicine

## 2022-07-18 ENCOUNTER — Encounter: Payer: Self-pay | Admitting: Internal Medicine

## 2022-07-18 VITALS — BP 138/80 | HR 72 | Temp 98.6°F | Ht 70.0 in | Wt 238.0 lb

## 2022-07-18 DIAGNOSIS — J0140 Acute pansinusitis, unspecified: Secondary | ICD-10-CM | POA: Insufficient documentation

## 2022-07-18 MED ORDER — AMOXICILLIN 500 MG PO TABS: 1000.0000 mg | ORAL_TABLET | Freq: Two times a day (BID) | ORAL | 0 refills | Status: AC

## 2022-07-18 MED ORDER — HYDROCODONE BIT-HOMATROP MBR 5-1.5 MG/5ML PO SOLN: 5.0000 mL | Freq: Every evening | ORAL | 0 refills | Status: DC | PRN

## 2022-07-18 NOTE — Progress Notes (Signed)
Subjective:    Patient ID: [name], male    DOB: 1955/09/01, 67 y.o.   MRN: [id number]  HPI Here due to respiratory illness  Started 4 days ago Head feels stuffed Frontal headache---ears ringing and aggravating No clear fever Slight sweats yesterday---and felt bad after work yesterday (had taken off the prior 2 days) Sore throat the whole time--and red. Some trouble swallowing Lots of cough---yellow mucus Cough keeping him up--and runny nose Occasionally feels it in his chest  COVID test negative Used OTC cold meds--not really helping  Current Outpatient Medications on File Prior to Visit  Medication Sig Dispense Refill   aspirin EC 81 MG tablet Take 81 mg by mouth daily. Swallow whole.     Bempedoic Acid-Ezetimibe (NEXLIZET) 180-10 MG TABS Take 1 tablet by mouth daily. 90 tablet 3   fluticasone (FLONASE) 50 MCG/ACT nasal spray SPRAY 2 SPRAYS INTO EACH NOSTRIL EVERY DAY 48 mL 3   loratadine (CLARITIN) 10 MG tablet Take 10 mg by mouth daily.     metoprolol tartrate (LOPRESSOR) 25 MG tablet TAKE 0.5 TABLETS BY MOUTH 2 TIMES DAILY. 90 tablet 3   Multiple Vitamin (MULTIVITAMIN) tablet Take 1 tablet by mouth daily.     Omega-3 Fatty Acids (FISH OIL) 1000 MG CAPS Take by mouth.     pantoprazole (PROTONIX) 40 MG tablet TAKE 1 TABLET BY MOUTH EVERY DAY 90 tablet 3   traZODone (DESYREL) 50 MG tablet Take 1-2 tablets (50-100 mg total) by mouth at bedtime. 180 tablet 3   No current facility-administered medications on file prior to visit.    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   LIMA to LAD, SVG to D1, EVH via right thigh   Sleep disorder    STEMI involving left anterior descending coronary artery ([place]) 04/12/2018   Wrist fracture     6 foot fall onto head, concussion, L wrist (comminuted with intra-articular extension) and 2nd finger (middle phalanx) fxs, vertigo    Past Surgical History:  Procedure Laterality Date   BRANCHIAL CLEFT CYST EXCISION     CARDIAC CATHETERIZATION     COLONOSCOPY  04/03/2007   CORONARY ARTERY BYPASS GRAFT N/A 04/12/2018   Procedure: CORONARY ARTERY BYPASS GRAFTING (CABG) x two , using left internal mammary artery and right leg greater saphenous vein harvested endoscopically;  Surgeon: Rexene Alberts, MD;  Location: [facility];  Service: Open Heart Surgery;  Laterality: N/A;   CORONARY/GRAFT ACUTE MI REVASCULARIZATION N/A 04/12/2018   Procedure: Coronary/Graft Acute MI Revascularization;  Surgeon: Sherren Mocha, MD;  Location: [facility];  Service: Cardiovascular;  Laterality: N/A;   HEMORRHOID SURGERY     LEFT HEART CATH AND CORONARY ANGIOGRAPHY N/A 04/12/2018   Procedure: LEFT HEART CATH AND CORONARY ANGIOGRAPHY;  Surgeon: Sherren Mocha, MD;  Location: [facility];  Service: Cardiovascular;  Laterality: N/A;   RHINOPLASTY     TEE WITHOUT CARDIOVERSION N/A 04/12/2018   Procedure: TRANSESOPHAGEAL ECHOCARDIOGRAM (TEE);  Surgeon: Rexene Alberts, MD;  Location: [facility];  Service: Open Heart Surgery;  Laterality: N/A;   TOTAL KNEE ARTHROPLASTY Right 11/21/2020   Procedure: TOTAL KNEE ARTHROPLASTY;  Surgeon: Paralee Cancel, MD;  Location: [facility];  Service: Orthopedics;  Laterality: Right;    Family History  Problem Relation Age of Onset   Coronary artery disease Mother    Stroke Mother    Coronary artery disease Brother    Cancer Brother        colon or rectal    Colon cancer Brother 62       half-brother    Diabetes Neg Hx    Stomach cancer Neg Hx     Social History   Socioeconomic History   Marital status: Married    Spouse name: Designer, fashion/clothing   Number of children: 4   Years of education: Not on file   Highest education level: High school graduate  Occupational History   Occupation: installs signs    Employer: [employer]  Tobacco Use   Smoking status: Former    Years: 61    Types: Cigarettes    Quit date: 10/27/2005    Years since quitting: 16.7   Smokeless tobacco: Never  Vaping Use   Vaping Use: Never used  Substance and Sexual Activity   Alcohol use: Yes    Comment: once a month-beer    Drug use: No   Sexual activity: Not on file  Other Topics Concern   Not on file  Social History Narrative   No living will   Wife should be health care POA---alternate is son   Would accept resuscitation   Not sure about tube feeds   Social Determinants of Health   Financial Resource Strain: Low Risk  (06/11/2018)   Overall Financial Resource Strain (CARDIA)    Difficulty of Paying Living Expenses: Not hard at all  Food Insecurity: No Food Insecurity (06/11/2018)   Hunger Vital Sign    Worried About Running Out of Food in the Last Year: Never true    [place] in the Last Year: Never true  Transportation Needs: No Transportation Needs (06/11/2018)   PRAPARE - Hydrologist (Medical): No    Lack of Transportation (Non-Medical): No  Physical Activity: Sufficiently Active (07/18/2022)   Exercise Vital Sign    Days of Exercise per Week: 6 days    Minutes of Exercise per Session: 150+ min  Stress: No Stress Concern Present (07/18/2022)   [place]    Feeling of Stress : Not at all  Social Connections: Moderately Isolated (07/18/2022)   Social Connection and Isolation Panel [NHANES]    Frequency of Communication with Friends and Family: More  than three times a week    Frequency of Social Gatherings with Friends and Family: Once a week    Attends Religious Services: Never    Marine scientist or Organizations: No    Attends Music therapist: Not on file    Marital Status: Married  Human resources officer Violence: Not on file   Review of Systems No N/V Appetite is off    Objective:   Physical Exam Constitutional:      Appearance: Normal appearance.  HENT:     Head:  Comments: Mild frontal and maxillary tenderness    Right Ear: Tympanic membrane and ear canal normal.     Left Ear: Tympanic membrane and ear canal normal.     Mouth/Throat:     Comments: Pharynx and uvula red without tonsillar enlargement or exudates Pulmonary:     Effort: Pulmonary effort is normal.     Breath sounds: Normal breath sounds. No wheezing or rales.  Musculoskeletal:     Cervical back: Neck supple.  Lymphadenopathy:     Cervical: No cervical adenopathy.  Neurological:     Mental Status: He is alert.            Assessment & Plan:

## 2022-07-18 NOTE — Assessment & Plan Note (Signed)
Likely still viral Discussed analgesics and OTC cough med Hycodan for sleep Stay out of work till next week to rest If worsens in the next couple of days--can start amoxil 1000 bid x 7 days

## 2022-07-24 ENCOUNTER — Other Ambulatory Visit (HOSPITAL_COMMUNITY): Payer: Self-pay

## 2022-08-26 ENCOUNTER — Telehealth: Payer: Self-pay | Admitting: Pharmacist

## 2022-08-26 NOTE — Telephone Encounter (Signed)
Chart notes faxed to [facility] for [place] authorization for Nexlizet

## 2022-09-03 ENCOUNTER — Telehealth: Payer: Self-pay

## 2022-09-03 DIAGNOSIS — I2510 Atherosclerotic heart disease of native coronary artery without angina pectoris: Secondary | ICD-10-CM

## 2022-09-03 DIAGNOSIS — E782 Mixed hyperlipidemia: Secondary | ICD-10-CM

## 2022-09-03 NOTE — Telephone Encounter (Signed)
Pharmacy Patient Advocate Encounter  Prior Authorization for Nexlizet 180-10mg  has been approved by Demetrio Lapping (ins).    Per pts plan: Effective dates: 5.3.24 through 10.31.25

## 2022-09-04 MED ORDER — NEXLIZET 180-10 MG PO TABS: 1.0000 | ORAL_TABLET | Freq: Every day | ORAL | 3 refills | Status: DC

## 2022-10-10 ENCOUNTER — Other Ambulatory Visit: Payer: Self-pay | Admitting: Internal Medicine

## 2022-10-28 ENCOUNTER — Other Ambulatory Visit: Payer: Self-pay

## 2022-10-28 ENCOUNTER — Emergency Department (HOSPITAL_COMMUNITY): Payer: No Typology Code available for payment source

## 2022-10-28 ENCOUNTER — Emergency Department (HOSPITAL_COMMUNITY)
Admission: EM | Admit: 2022-10-28 | Discharge: 2022-10-28 | Disposition: A | Discharge status: Home / Self Care | Payer: No Typology Code available for payment source | Attending: Emergency Medicine | Admitting: Emergency Medicine

## 2022-10-28 ENCOUNTER — Encounter (HOSPITAL_COMMUNITY): Payer: Self-pay

## 2022-10-28 DIAGNOSIS — I10 Essential (primary) hypertension: Secondary | ICD-10-CM | POA: Insufficient documentation

## 2022-10-28 DIAGNOSIS — Z951 Presence of aortocoronary bypass graft: Secondary | ICD-10-CM | POA: Diagnosis not present

## 2022-10-28 DIAGNOSIS — I2510 Atherosclerotic heart disease of native coronary artery without angina pectoris: Secondary | ICD-10-CM | POA: Insufficient documentation

## 2022-10-28 DIAGNOSIS — R0789 Other chest pain: Secondary | ICD-10-CM | POA: Insufficient documentation

## 2022-10-28 DIAGNOSIS — R079 Chest pain, unspecified: Secondary | ICD-10-CM | POA: Diagnosis present

## 2022-10-28 LAB — CBC
HCT: 45.1 % (ref 39.0–52.0)
Hemoglobin: 14.8 g/dL (ref 13.0–17.0)
MCH: 28.9 pg (ref 26.0–34.0)
MCHC: 32.8 g/dL (ref 30.0–36.0)
MCV: 88.1 fL (ref 80.0–100.0)
Platelets: 262 10*3/uL (ref 150–400)
RBC: 5.12 MIL/uL (ref 4.22–5.81)
RDW: 13.4 % (ref 11.5–15.5)
WBC: 8.9 10*3/uL (ref 4.0–10.5)
nRBC: 0 % (ref 0.0–0.2)

## 2022-10-28 LAB — TROPONIN I (HIGH SENSITIVITY)
Troponin I (High Sensitivity): 8 ng/L (ref <18)
Troponin I (High Sensitivity): 7 ng/L (ref <18)
Troponin I (High Sensitivity): 8 ng/L (ref <18)

## 2022-10-28 LAB — BASIC METABOLIC PANEL
Anion gap: 10 (ref 5–15)
BUN: 19 mg/dL (ref 8–23)
CO2: 25 mmol/L (ref 22–32)
Calcium: 9.2 mg/dL (ref 8.9–10.3)
Chloride: 105 mmol/L (ref 98–111)
Creatinine, Ser: 1.23 mg/dL (ref 0.61–1.24)
GFR, Estimated: >60 mL/min (ref >60)
Glucose, Bld: 108 mg/dL — ABNORMAL HIGH (ref 70–99)
Potassium: 3.8 mmol/L (ref 3.5–5.1)
Sodium: 140 mmol/L (ref 135–145)

## 2022-10-28 LAB — D-DIMER, QUANTITATIVE: D-Dimer, Quant: <0.27 ug/mL-FEU (ref 0.00–0.50)

## 2022-10-28 NOTE — ED Triage Notes (Addendum)
L sided Chest pressure radiating to L arm x2 days. Mild SHOB. H/x cabg in 2019.

## 2022-10-28 NOTE — ED Notes (Signed)
Patient ambulated to the restroom independently  

## 2022-10-28 NOTE — Discharge Instructions (Signed)
Thank you for coming to [facility] Emergency Department. You were seen for intermittent chest pain. We did an exam, labs, and imaging, and these showed no acute findings. Please follow up with your cardiologist within 1 week.   Do not hesitate to return to the ED or call 911 if you experience: -Worsening symptoms -Shortness of breath -Numbness/tingling -Lightheadedness, passing out -Fevers/chills -Anything else that concerns you

## 2022-10-28 NOTE — ED Provider Notes (Signed)
[place] EMERGENCY DEPARTMENT AT [facility] Provider Note   CSN: [id number] Arrival date & time: 10/28/22  1715     History {Add pertinent medical, surgical, social history, OB history to HPI:1} Chief Complaint  Patient presents with   Chest Pain    [name] is a 67 y.o. male with HTN, HLD, GERD, prediabetes, CAD s/p CABG 2019, who presents with CP.   L sided Chest pressure radiating to L arm x 2 days, rated 5/10. Hurts intermittently, every once in awhile, lasts 1-2 minutes. Triggered by exertion, not specific movements. Denies nausea/vomiting, lightheadedness, abd pain. Endorses some SOB/DOE. Also endorses leg pain with walking, L worse than R. Had those symptoms with heart attack last time in 2019. Denies swelling in legs. Denies recent travel/hospitalizations/surgeries, no h/o DVT/PE. No AC. Per chart review, had stress test in February 2024 that showed "low risk with no evidence of ischemia."   Chest Pain      Home Medications Prior to Admission medications   Medication Sig Start Date End Date Taking? Authorizing Provider  aspirin EC 81 MG tablet Take 81 mg by mouth daily. Swallow whole.    [provider]  Bempedoic Acid-Ezetimibe (NEXLIZET) 180-10 MG TABS Take 1 tablet by mouth daily. 09/04/22   Runell Gess, MD  fluticasone (FLONASE) 50 MCG/ACT nasal spray SPRAY 2 SPRAYS INTO EACH NOSTRIL EVERY DAY 06/06/22   Karie Schwalbe, MD  HYDROcodone bit-homatropine (HYCODAN) 5-1.5 MG/5ML syrup Take 5 mLs by mouth at bedtime as needed for cough. 07/18/22   Karie Schwalbe, MD  loratadine (CLARITIN) 10 MG tablet Take 10 mg by mouth daily.    [provider]  metoprolol tartrate (LOPRESSOR) 25 MG tablet TAKE 0.5 TABLETS BY MOUTH 2 TIMES DAILY. 06/06/22   Runell Gess, MD  Multiple Vitamin (MULTIVITAMIN) tablet Take 1 tablet by mouth daily.    [provider]  Omega-3 Fatty Acids (FISH OIL) 1000 MG CAPS Take by mouth.    [provider]  pantoprazole (PROTONIX) 40 MG tablet TAKE 1 TABLET BY MOUTH EVERY DAY 10/10/22   Karie Schwalbe, MD  traZODone (DESYREL) 50 MG tablet Take 1-2 tablets (50-100 mg total) by mouth at bedtime. 06/06/22   Karie Schwalbe, MD      Allergies    Atorvastatin, Morphine and codeine, Repatha  [evolocumab], Simvastatin, and Pravachol [pravastatin sodium]    Review of Systems   Review of Systems  Cardiovascular:  Positive for chest pain.   A 10 point review of systems was performed and is negative unless otherwise reported in HPI.  Physical Exam Updated Vital Signs BP (!) 152/80   Pulse 76   Temp (!) 97.5 F (36.4 C)   Resp 20   Ht 5' 10.5" (1.791 m)   Wt 108.9 kg   SpO2 100%   BMI 33.95 kg/m  Physical Exam General: Normal appearing male, lying in bed.  HEENT: PERRLA, Sclera anicteric, MMM, trachea midline.  Cardiology: RRR, no murmurs/rubs/gallops. BL radial and DP pulses equal bilaterally.  Resp: Normal respiratory rate and effort. CTAB, no wheezes, rhonchi, crackles.  Abd: Soft, non-tender, non-distended. No rebound tenderness or guarding.  GU: Deferred. MSK: No peripheral edema or signs of trauma. Extremities without deformity or TTP. No cyanosis or clubbing. Skin: warm, dry. No rashes or lesions. Back: No CVA tenderness Neuro: A&Ox4, CNs II-XII grossly intact. MAEs. Sensation grossly intact.  Psych: Normal mood and affect.   ED Results / Procedures / Treatments  Labs (all labs ordered are listed, but only abnormal results are displayed) Labs Reviewed  BASIC METABOLIC PANEL - Abnormal; Notable for the following components:      Result Value   Glucose, Bld 108 (*)    All other components within normal limits  CBC  TROPONIN I (HIGH SENSITIVITY)  TROPONIN I (HIGH SENSITIVITY)    EKG EKG Interpretation Date/Time:  Monday October 28 2022 17:43:14 EDT Ventricular Rate:  67 PR Interval:  252 QRS Duration:  154 QT Interval:  434 QTC Calculation: 458 R  Axis:   -67  Text Interpretation: Sinus rhythm with 1st degree A-V block Right bundle branch block Left anterior fascicular block *** Bifascicular block *** Bifascicular block and 1st degree block are new since last EKG in 2019 Confirmed by Vivi Barrack [phone]) on 10/28/2022 7:59:57 PM  Radiology DG Chest 2 View  Result Date: 10/28/2022 CLINICAL DATA:  Left-sided chest pain. EXAM: CHEST - 2 VIEW COMPARISON:  Chest radiograph dated Aug 27, 2020 FINDINGS: The heart size and mediastinal contours are within normal limits. Sternotomy wires and mediastinal surgical clips suggesting prior coronary artery bypass grafting. Both lungs are clear. The visualized skeletal structures are unremarkable. IMPRESSION: No active cardiopulmonary disease. Electronically Signed   By: Larose Hires D.O.   On: 10/28/2022 19:05    Procedures Procedures  {Document cardiac monitor, telemetry assessment procedure when appropriate:1}  Medications Ordered in ED Medications - No data to display  ED Course/ Medical Decision Making/ A&P                          Medical Decision Making Amount and/or Complexity of Data Reviewed Labs: ordered. Radiology: ordered.    This patient presents to the ED for concern of ***, this involves an extensive number of treatment options, and is a complaint that carries with it a high risk of complications and morbidity.  I considered the following differential and admission for this acute, potentially life threatening condition.   MDM:    DDX for chest pain includes but is not limited to:  ACS/arrhythmia,  PE, aortic dissection, PNA, PTX, esophogeal rupture, biliary disease, cardiac tamponade, pericarditis, GERD/PUD/gastritis, or musculoskeletal pain. Very low suspicion for ACS vs aortic dissection given presenting sx. Patient cannot PERC out based on age, but will obtain D dimer and reassess, minimal risk factors for PE. No abdominal pain and no c/f biliary disease. Consider GERD, given  known history.    Clinical Course as of 10/28/22 2109  Mon Oct 28, 2022  2106 Troponin I (High Sensitivity): 8 neg [HN]  2106 BMP/CBC wnl [HN]  2107 CXR wnl w/ sternotomy [HN]    Clinical Course User Index [HN] Loetta Rough, MD    Labs: I Ordered, and personally interpreted labs.  The pertinent results include:  those listed above  Imaging Studies ordered: I ordered imaging studies including CXR I independently visualized and interpreted imaging. I agree with the radiologist interpretation  Additional history obtained from chart review, wife at bedside.  Cardiac Monitoring: The patient was maintained on a cardiac monitor.  I personally viewed and interpreted the cardiac monitored which showed an underlying rhythm of: NSR  Reevaluation: After the interventions noted above, I reevaluated the patient and found that they have :improved  Social Determinants of Health: Patient lives independently  Disposition:  ***  Co morbidities that complicate the patient evaluation  Past Medical History:  Diagnosis Date   Arthritis    CAD (coronary  artery disease) 10/2005   Cath for unstable angina--- 50-70% LAD, 40% circ   ED (erectile dysfunction)    GERD (gastroesophageal reflux disease)    Hx of colonic polyps    Hyperlipidemia    Hypertension    Impaired fasting glucose    Pre-diabetes    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   I have reviewed the patients home medicines and have made adjustments as needed  Problem List / ED Course: Problem List Items Addressed This Visit   None        {Document  critical care time when appropriate:1} {Document review of labs and clinical decision tools ie heart score, Chads2Vasc2 etc:1}  {Document your independent review of radiology images, and any outside records:1} {Document your discussion with family members, caretakers, and with consultants:1} {Document social determinants of health affecting pt's care:1} {Document your decision making why or why not admission, treatments were needed:1}  This note was created using dictation software, which may contain spelling or grammatical errors.

## 2022-10-29 ENCOUNTER — Telehealth: Payer: Self-pay

## 2022-10-29 NOTE — Transitions of Care (Post Inpatient/ED Visit) (Signed)
   10/29/2022  Name: [name] MRN: [id number] DOB: 12-16-1955  Today's TOC FU Call Status: Today's TOC FU Call Status:: Successful TOC FU Call Competed TOC FU Call Complete Date: 10/29/22  Transition Care Management Follow-up Telephone Call Date of Discharge: 10/28/22 [facility] Type of Discharge: Emergency Department Reason for ED Visit: Cardiac Conditions, Other: (atypical chest pain) How have you been since you were released from the hospital?: Better Any questions or concerns?: No  Items Reviewed: Did you receive and understand the discharge instructions provided?: Yes Medications obtained,verified, and reconciled?: Yes (Medications Reviewed) Any new allergies since your discharge?: No Dietary orders reviewed?: Yes Do you have support at home?: Yes  Medications Reviewed Today: Medications Reviewed Today     Reviewed by Merleen Nicely, LPN (Licensed Practical Nurse) on 10/29/22 at 1234  Med List Status: <None>   Medication Order Taking? Sig Documenting Provider Last Dose Status Informant  aspirin EC 81 MG tablet [id number] Yes Take 81 mg by mouth daily. Swallow whole. [provider] Taking Active   Bempedoic Acid-Ezetimibe (NEXLIZET) 180-10 MG TABS [id number] Yes Take 1 tablet by mouth daily. Runell Gess, MD Taking Active   fluticasone [facility]) 50 MCG/ACT nasal spray [id number] Yes SPRAY 2 SPRAYS INTO EACH NOSTRIL EVERY DAY Karie Schwalbe, MD Taking Active   HYDROcodone bit-homatropine (HYCODAN) 5-1.5 MG/5ML syrup [id number] Yes Take 5 mLs by mouth at bedtime as needed for cough. Karie Schwalbe, MD Taking Active   loratadine (CLARITIN) 10 MG tablet [id number] Yes Take 10 mg by mouth daily. [provider] Taking Active Self  metoprolol tartrate (LOPRESSOR) 25 MG tablet [id number] Yes TAKE 0.5 TABLETS BY MOUTH 2 TIMES DAILY. Runell Gess, MD Taking Active   Multiple Vitamin (MULTIVITAMIN) tablet [id number] Yes Take 1  tablet by mouth daily. [provider] Taking Active Self  Omega-3 Fatty Acids (FISH OIL) 1000 MG CAPS [id number] Yes Take by mouth. [provider] Taking Active   pantoprazole (PROTONIX) 40 MG tablet [id number] Yes TAKE 1 TABLET BY MOUTH EVERY DAY Karie Schwalbe, MD Taking Active   traZODone (DESYREL) 50 MG tablet [id number] Yes Take 1-2 tablets (50-100 mg total) by mouth at bedtime. Karie Schwalbe, MD Taking Active             Home Care and Equipment/Supplies: Were Home Health Services Ordered?: NA Any new equipment or medical supplies ordered?: NA  Functional Questionnaire: Do you need assistance with bathing/showering or dressing?: No Do you need assistance with meal preparation?: No Do you need assistance with eating?: No Do you have difficulty maintaining continence: No Do you need assistance with getting out of bed/getting out of a chair/moving?: No Do you have difficulty managing or taking your medications?: No  Follow up appointments reviewed: PCP Follow-up appointment confirmed?: No MD Provider Line Number:[phone] Given: Yes Specialist Hospital Follow-up appointment confirmed?: Yes Date of Specialist follow-up appointment?: 11/01/22 Follow-Up Specialty Provider:: Dr Allyson Sabal Do you need transportation to your follow-up appointment?: No Do you understand care options if your condition(s) worsen?: Yes-patient verbalized understanding    SIGNATURE  Woodfin Ganja LPN [facility] Nurse Health Advisor Direct Dial [phone]

## 2022-10-30 NOTE — Progress Notes (Signed)
Cardiology Clinic Note   Patient Name: [name] Date of Encounter: 11/01/2022  Primary Care Provider:  Karie Schwalbe, MD Primary Cardiologist:  Nanetta Batty, MD  Patient Profile    [name] 67 year old male presents the clinic today for follow-up evaluation of his chest discomfort.  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   LIMA to LAD, SVG to D1, EVH via right thigh   Sleep disorder    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) 04/12/2018   Wrist fracture     6 foot fall onto head, concussion, L wrist (comminuted with intra-articular extension) and 2nd finger (middle phalanx) fxs, vertigo   Past Surgical History:  Procedure Laterality Date   BRANCHIAL CLEFT CYST EXCISION     CARDIAC CATHETERIZATION     COLONOSCOPY  04/03/2007   CORONARY ARTERY BYPASS GRAFT N/A 04/12/2018   Procedure: CORONARY ARTERY BYPASS GRAFTING (CABG) x two , using left internal mammary artery and right leg greater saphenous vein harvested endoscopically;  Surgeon: Purcell Nails, MD;  Location: [facility] OR;  Service: Open Heart Surgery;  Laterality: N/A;   CORONARY/GRAFT ACUTE MI REVASCULARIZATION N/A 04/12/2018   Procedure: Coronary/Graft Acute MI Revascularization;  Surgeon: Tonny Bollman, MD;  Location: [facility] INVASIVE CV LAB;  Service: Cardiovascular;  Laterality: N/A;   HEMORRHOID SURGERY     LEFT HEART CATH AND CORONARY ANGIOGRAPHY N/A 04/12/2018   Procedure: LEFT HEART CATH AND CORONARY ANGIOGRAPHY;  Surgeon: Tonny Bollman, MD;  Location: [facility] INVASIVE CV LAB;  Service: Cardiovascular;  Laterality: N/A;   RHINOPLASTY     TEE WITHOUT  CARDIOVERSION N/A 04/12/2018   Procedure: TRANSESOPHAGEAL ECHOCARDIOGRAM (TEE);  Surgeon: Purcell Nails, MD;  Location: [facility] OR;  Service: Open Heart Surgery;  Laterality: N/A;   TOTAL KNEE ARTHROPLASTY Right 11/21/2020   Procedure: TOTAL KNEE ARTHROPLASTY;  Surgeon: Durene Romans, MD;  Location: [facility] ORS;  Service: Orthopedics;  Laterality: Right;    Allergies  Allergies  Allergen Reactions   Atorvastatin     REACTION: muscle aches   Morphine And Codeine Itching   Repatha  [Evolocumab]     Muscle pain   Simvastatin     REACTION: hand pain   Pravachol [Pravastatin Sodium]     Muscle aching    History of Present Illness    [name] has a PMH of hypertension, GERD, hyperlipidemia, prediabetes, coronary artery disease and is status post CABG in 2019.  Stress test 2/24 showed low risk and no evidence of ischemia.  He presented to the emergency department on 10/28/2022 with reports of chest discomfort.  He noted pain in his left side which radiated to his left arm for 2 days.  He reported his pain was 5 out of 10.  It would hurt intermittently every once in a while and last for approximately 1 to 2 minutes.  He noted increased pain with exertion.  He denied nausea vomiting lightheadedness and abdominal pain.  He endorsed shortness of breath and dyspnea on exertion.  He did note leg pain with walking.  His left was worse  than his right.  He reported similar symptoms with his heart attack in 2019.  He denied lower extremity swelling.  His BMP showed stable renal function and electrolytes.  His CBC was unremarkable.  His high-sensitivity troponins were 8 and 7 and 8.  His chest x-ray showed no active cardiopulmonary disease.  His EKG showed sinus rhythm with first-degree block, right bundle branch block, left anterior fascicular block 67 bpm.  His D-dimer was negative.  He was instructed to follow-up with cardiology.  He presents to the clinic today for follow-up evaluation and states he noticed  another episode of chest pressure while he was working.  He works at [employer].  He reports that he has been still working full-time around 9 hours/day and is required to do some light lifting.  We reviewed his trip to the emergency department.  He and his wife expressed understanding.  He reports compliance with his Protonix.  He has noticed some varicose veins in his right lower leg.  I recommended lower extremity support stockings.  I will order an echocardiogram for further evaluation, have him continue his current diet and exercise and plan follow-up in 3 to 4 months.  Today he denies chest pain, shortness of breath, lower extremity edema, fatigue, palpitations, melena, hematuria, hemoptysis, diaphoresis, weakness, presyncope, syncope, orthopnea, and PND.    Home Medications    Prior to Admission medications   Medication Sig Start Date End Date Taking? Authorizing Provider  aspirin EC 81 MG tablet Take 81 mg by mouth daily. Swallow whole.    [provider]  Bempedoic Acid-Ezetimibe (NEXLIZET) 180-10 MG TABS Take 1 tablet by mouth daily. 09/04/22   Runell Gess, MD  fluticasone (FLONASE) 50 MCG/ACT nasal spray SPRAY 2 SPRAYS INTO EACH NOSTRIL EVERY DAY 06/06/22   Karie Schwalbe, MD  HYDROcodone bit-homatropine (HYCODAN) 5-1.5 MG/5ML syrup Take 5 mLs by mouth at bedtime as needed for cough. 07/18/22   Karie Schwalbe, MD  loratadine (CLARITIN) 10 MG tablet Take 10 mg by mouth daily.    [provider]  metoprolol tartrate (LOPRESSOR) 25 MG tablet TAKE 0.5 TABLETS BY MOUTH 2 TIMES DAILY. 06/06/22   Runell Gess, MD  Multiple Vitamin (MULTIVITAMIN) tablet Take 1 tablet by mouth daily.    [provider]  Omega-3 Fatty Acids (FISH OIL) 1000 MG CAPS Take by mouth.    [provider]  pantoprazole (PROTONIX) 40 MG tablet TAKE 1 TABLET BY MOUTH EVERY DAY 10/10/22   Karie Schwalbe, MD  traZODone (DESYREL) 50 MG tablet Take 1-2 tablets (50-100 mg total)  by mouth at bedtime. 06/06/22   Karie Schwalbe, MD    Family History    Family History  Problem Relation Age of Onset   Coronary artery disease Mother    Stroke Mother    Coronary artery disease Brother    Cancer Brother        colon or rectal   Colon cancer Brother 30       half-brother    Diabetes Neg Hx    Stomach cancer Neg Hx    He indicated that his mother is deceased. He indicated that his father is deceased. He indicated that his sister is alive. He indicated that two of his four brothers are alive. He indicated that the status of his neg hx is unknown.  Social History    Social History   Socioeconomic History   Marital status: Married    Spouse name: Merchandiser, retail  Number of children: 4   Years of education: Not on file   Highest education level: High school graduate  Occupational History   Occupation: installs signs    Employer: [employer]  Tobacco Use   Smoking status: Former    Years: 35    Types: Cigarettes    Quit date: 10/27/2005    Years since quitting: 17.0   Smokeless tobacco: Never  Vaping Use   Vaping Use: Never used  Substance and Sexual Activity   Alcohol use: Yes    Comment: once a month-beer    Drug use: No   Sexual activity: Not on file  Other Topics Concern   Not on file  Social History Narrative   No living will   Wife should be health care POA---alternate is son   Would accept resuscitation   Not sure about tube feeds   Social Determinants of Health   Financial Resource Strain: Low Risk  (06/11/2018)   Overall Financial Resource Strain (CARDIA)    Difficulty of Paying Living Expenses: Not hard at all  Food Insecurity: No Food Insecurity (06/11/2018)   Hunger Vital Sign    Worried About Running Out of Food in the Last Year: Never true    Ran Out of Food in the Last Year: Never true  Transportation Needs: No Transportation Needs (06/11/2018)   PRAPARE - Administrator, Civil Service (Medical): No    Lack of  Transportation (Non-Medical): No  Physical Activity: Sufficiently Active (07/18/2022)   Exercise Vital Sign    Days of Exercise per Week: 6 days    Minutes of Exercise per Session: 150+ min  Stress: No Stress Concern Present (07/18/2022)   [employer] of Occupational Health - Occupational Stress Questionnaire    Feeling of Stress : Not at all  Social Connections: Moderately Isolated (07/18/2022)   Social Connection and Isolation Panel [NHANES]    Frequency of Communication with Friends and Family: More than three times a week    Frequency of Social Gatherings with Friends and Family: Once a week    Attends Religious Services: Never    Database administrator or Organizations: No    Attends Engineer, structural: Not on file    Marital Status: Married  Catering manager Violence: Not on file     Review of Systems    General:  No chills, fever, night sweats or weight changes.  Cardiovascular:  No chest pain, dyspnea on exertion, edema, orthopnea, palpitations, paroxysmal nocturnal dyspnea. Dermatological: No rash, lesions/masses Respiratory: No cough, dyspnea Urologic: No hematuria, dysuria Abdominal:   No nausea, vomiting, diarrhea, bright red blood per rectum, melena, or hematemesis Neurologic:  No visual changes, wkns, changes in mental status. All other systems reviewed and are otherwise negative except as noted above.  Physical Exam    VS:  BP 128/84   Pulse 74   Ht 5\' 10"  (1.778 m)   Wt 238 lb 6.4 oz (108.1 kg)   SpO2 94%   BMI 34.21 kg/m  , BMI Body mass index is 34.21 kg/m. GEN: Well nourished, well developed, in no acute distress. HEENT: normal. Neck: Supple, no JVD, carotid bruits, or masses. Cardiac: RRR, no murmurs, rubs, or gallops. No clubbing, cyanosis, edema.  Radials/DP/PT 2+ and equal bilaterally.  Respiratory:  Respirations regular and unlabored, clear to auscultation bilaterally. GI: Soft, nontender, nondistended, BS + x 4. MS: no deformity or  atrophy. Skin: warm and dry, no rash. Neuro:  Strength and sensation  are intact. Psych: Normal affect.  Accessory Clinical Findings    Recent Labs: 03/12/2022: ALT 22 10/28/2022: BUN 19; Creatinine, Ser 1.23; Hemoglobin 14.8; Platelets 262; Potassium 3.8; Sodium 140   Recent Lipid Panel    Component Value Date/Time   CHOL 140 03/12/2022 0822   CHOL 127 06/01/2019 1020   TRIG 99.0 03/12/2022 0822   HDL 43.50 03/12/2022 0822   HDL 57 06/01/2019 1020   CHOLHDL 3 03/12/2022 0822   VLDL 19.8 03/12/2022 0822   LDLCALC 77 03/12/2022 0822   LDLCALC 55 06/01/2019 1020   LDLDIRECT 171.0 12/25/2012 0950         ECG personally reviewed by me today-none today.  Nuclear stress test 05/30/2022  The study is normal. The study is low risk.  No ST deviation was noted.  LV perfusion is normal. There is no evidence of ischemia. There is no evidence of infarction.  Left ventricular function is normal. Nuclear stress EF: 63 %. The left ventricular ejection fraction is normal (55-65%). End diastolic cavity size is normal. End systolic cavity size is normal.  Prior study available for comparison from 05/30/2020.  Normal resting and stress perfusion. No ischemia or infarction EF 63%    Assessment & Plan   1.  Atypical chest pain-no chest pain today.  Seen in the emergency department on 10/28/2022.  High-sensitivity troponins low and flat.  Chest x-ray unremarkable.  D-dimer negative.  Low risk stress test 2/24 Order echocardiogram  Essential hypertension-BP today 128/84. Maintain blood pressure log Continue current medical therapy  Hyperlipidemia-LDL 77 on 03/12/22.  Intolerant of statins and Repatha Continue Nexlizet High-fiber diet  Coronary artery disease-no chest pain today.  STEMI 12/19 with cardiac catheterization and emergent CABG (LIMA-LAD and SVG-diagonal. Continue current medical therapy Heart healthy low-sodium diet Maintain physical activity  Disposition: Follow-up with Dr.  Allyson Sabal or me in 3-4 months.   Thomasene Ripple. Lavell Ridings NP-C     11/01/2022, 10:52 AM [facility] [address] Office [phone] Fax [phone]    I spent 14 minutes examining this patient, reviewing medications, and using patient centered shared decision making involving her cardiac care.  Prior to her visit I spent greater than 20 minutes reviewing her past medical history,  medications, and prior cardiac tests.

## 2022-11-01 ENCOUNTER — Ambulatory Visit: Payer: No Typology Code available for payment source | Attending: Cardiovascular Disease | Admitting: General Practice

## 2022-11-01 ENCOUNTER — Encounter: Payer: Self-pay | Admitting: General Practice

## 2022-11-01 ENCOUNTER — Ambulatory Visit: Payer: No Typology Code available for payment source | Admitting: Cardiovascular Disease

## 2022-11-01 VITALS — BP 128/84 | HR 74 | Ht 70.0 in | Wt 238.4 lb

## 2022-11-01 DIAGNOSIS — E782 Mixed hyperlipidemia: Secondary | ICD-10-CM

## 2022-11-01 DIAGNOSIS — R0789 Other chest pain: Secondary | ICD-10-CM

## 2022-11-01 DIAGNOSIS — I2510 Atherosclerotic heart disease of native coronary artery without angina pectoris: Secondary | ICD-10-CM | POA: Diagnosis not present

## 2022-11-01 DIAGNOSIS — I10 Essential (primary) hypertension: Secondary | ICD-10-CM | POA: Diagnosis not present

## 2022-11-01 NOTE — Patient Instructions (Signed)
Medication Instructions:  The current medical regimen is effective;  continue present plan and medications as directed. Please refer to the Current Medication list given to you today.  *If you need a refill on your cardiac medications before your next appointment, please call your pharmacy*  Lab Work: NONE If you have labs (blood work) drawn today and your tests are completely normal, you will receive your results only by: MyChart Message (if you have MyChart) OR  A paper copy in the mail If you have any lab test that is abnormal or we need to change your treatment, we will call you to review the results.  Testing/Procedures: Your physician has requested that you have an echocardiogram. Echocardiography is a painless test that uses sound waves to create images of your heart. It provides your doctor with information about the size and shape of your heart and how well your heart's chambers and valves are working. This procedure takes approximately one hour. There are no restrictions for this procedure. Please do NOT wear cologne, perfume, aftershave, or lotions (deodorant is allowed). Please arrive 15 minutes prior to your appointment time.  Follow-Up: At [facility], you and your health needs are our priority.  As part of our continuing mission to provide you with exceptional heart care, we have created designated Provider Care Teams.  These Care Teams include your primary Cardiologist (physician) and Advanced Practice Providers (APPs -  Physician Assistants and Nurse Practitioners) who all work together to provide you with the care you need, when you need it.  Your next appointment:   3-4 month(s) AFTER ECHO  Provider:   Nanetta Batty, MD  or Edd Fabian, FNP-C   Other Instructions NONE

## 2022-11-26 ENCOUNTER — Ambulatory Visit (HOSPITAL_COMMUNITY): Payer: No Typology Code available for payment source | Attending: General Practice

## 2022-11-26 DIAGNOSIS — I2510 Atherosclerotic heart disease of native coronary artery without angina pectoris: Secondary | ICD-10-CM | POA: Diagnosis not present

## 2022-11-26 DIAGNOSIS — R0789 Other chest pain: Secondary | ICD-10-CM | POA: Diagnosis present

## 2022-11-26 LAB — ECHOCARDIOGRAM COMPLETE
Area-P 1/2: 2.19 cm2
S' Lateral: 3.3 cm

## 2023-01-31 NOTE — Progress Notes (Unsigned)
Cardiology Clinic Note   Patient Name: [name] Date of Encounter: 02/04/2023  Primary Care Provider:  Karie Schwalbe, MD Primary Cardiologist:  Nanetta Batty, MD  Patient Profile    [name] 67 year old male presents the clinic today for follow-up evaluation of his chest discomfort.  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   LIMA to LAD, SVG to D1, EVH via right thigh   Sleep disorder    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) 04/12/2018   Wrist fracture     6 foot fall onto head, concussion, L wrist (comminuted with intra-articular extension) and 2nd finger (middle phalanx) fxs, vertigo   Past Surgical History:  Procedure Laterality Date   BRANCHIAL CLEFT CYST EXCISION     CARDIAC CATHETERIZATION     COLONOSCOPY  04/03/2007   CORONARY ARTERY BYPASS GRAFT N/A 04/12/2018   Procedure: CORONARY ARTERY BYPASS GRAFTING (CABG) x two , using left internal mammary artery and right leg greater saphenous vein harvested endoscopically;  Surgeon: Purcell Nails, MD;  Location: [facility] OR;  Service: Open Heart Surgery;  Laterality: N/A;   CORONARY/GRAFT ACUTE MI REVASCULARIZATION N/A 04/12/2018   Procedure: Coronary/Graft Acute MI Revascularization;  Surgeon: Tonny Bollman, MD;  Location: [facility] INVASIVE CV LAB;  Service: Cardiovascular;  Laterality: N/A;   HEMORRHOID SURGERY     LEFT HEART CATH AND CORONARY ANGIOGRAPHY N/A 04/12/2018   Procedure: LEFT HEART CATH AND CORONARY ANGIOGRAPHY;  Surgeon: Tonny Bollman, MD;  Location[facility] INVASIVE CV LAB;  Service: Cardiovascular;  Laterality: N/A;   RHINOPLASTY     TEE WITHOUT  CARDIOVERSION N/A 04/12/2018   Procedure: TRANSESOPHAGEAL ECHOCARDIOGRAM (TEE);  Surgeon: Purcell Nails, MD;  Location: [facility] OR;  Service: Open Heart Surgery;  Laterality: N/A;   TOTAL KNEE ARTHROPLASTY Right 11/21/2020   Procedure: TOTAL KNEE ARTHROPLASTY;  Surgeon: Durene Romans, MD;  Location: [facility] ORS;  Service: Orthopedics;  Laterality: Right;    Allergies  Allergies  Allergen Reactions   Atorvastatin     REACTION: muscle aches   Morphine And Codeine Itching   Repatha  [Evolocumab]     Muscle pain   Simvastatin     REACTION: hand pain   Pravachol [Pravastatin Sodium]     Muscle aching    History of Present Illness    [name] has a PMH of hypertension, GERD, hyperlipidemia, prediabetes, coronary artery disease and is status post CABG in 2019.  Stress test 2/24 showed low risk and no evidence of ischemia.  He presented to the emergency department on 10/28/2022 with reports of chest discomfort.  He noted pain in his left side which radiated to his left arm for 2 days.  He reported his pain was 5 out of 10.  It would hurt intermittently every once in a while and last for approximately 1 to 2 minutes.  He noted increased pain with exertion.  He denied nausea vomiting lightheadedness and abdominal pain.  He endorsed shortness of breath and dyspnea on exertion.  He did note leg pain with walking.  His left was worse  than his right.  He reported similar symptoms with his heart attack in 2019.  He denied lower extremity swelling.  His BMP showed stable renal function and electrolytes.  His CBC was unremarkable.  His high-sensitivity troponins were 8 and 7 and 8.  His chest x-ray showed no active cardiopulmonary disease.  His EKG showed sinus rhythm with first-degree block, right bundle branch block, left anterior fascicular block 67 bpm.  His D-dimer was negative.  He was instructed to follow-up with cardiology.  He presents to the clinic 11/01/22 for follow-up evaluation and stated he noticed  another episode of chest pressure while he was working.  He works at [employer].  He reported that he had been still working full-time around 9 hours/day and was required to do some light lifting.  We reviewed his trip to the emergency department.  He and his wife expressed understanding.  He reported compliance with his Protonix.  He had noticed some varicose veins in his right lower leg.  I recommended lower extremity support stockings.  I  ordered an echocardiogram for further evaluation, asked him to continue his current diet and exercise and planned follow-up in 3 to 4 months.  Echocardiogram 11/26/2022 showed normal LVEF, mild concentric LVH, mildly dilated right atria, trivial mitral valve regurgitation and no other significant valvular abnormalities.  He presents to the clinic today for follow-up evaluation and states he continues to work full-time at [employer].  He presents with his wife.  We reviewed his echocardiogram from the summer.  He expressed understanding.  He enjoyed watching the study.  His wife feels that his chest discomfort previously may have been related to COVID infection.  He plans to retire in 2 years.  We reviewed the importance of high-fiber diet and continued physical activity.  I will plan follow-up in 9 to 12 months.  He is not fasting today and reports that he has a annual physical exam with his PCP at the beginning of November I requested that he have fasting lipids at that time and send [place] the results.  Today he denies chest pain, shortness of breath, lower extremity edema, fatigue, palpitations, melena, hematuria, hemoptysis, diaphoresis, weakness, presyncope, syncope, orthopnea, and PND.    Home Medications    Prior to Admission medications   Medication Sig Start Date End Date Taking? Authorizing Provider  aspirin EC 81 MG tablet Take 81 mg by mouth daily. Swallow whole.    [provider]  Bempedoic Acid-Ezetimibe (NEXLIZET) 180-10 MG TABS Take 1 tablet  by mouth daily. 09/04/22   Runell Gess, MD  fluticasone (FLONASE) 50 MCG/ACT nasal spray SPRAY 2 SPRAYS INTO EACH NOSTRIL EVERY DAY 06/06/22   Karie Schwalbe, MD  HYDROcodone bit-homatropine (HYCODAN) 5-1.5 MG/5ML syrup Take 5 mLs by mouth at bedtime as needed for cough. 07/18/22   Karie Schwalbe, MD  loratadine (CLARITIN) 10 MG tablet Take 10 mg by mouth daily.    [provider]  metoprolol tartrate (LOPRESSOR) 25 MG tablet TAKE 0.5 TABLETS BY MOUTH 2 TIMES DAILY. 06/06/22   Runell Gess, MD  Multiple Vitamin (MULTIVITAMIN) tablet Take 1 tablet by mouth daily.    [provider]  Omega-3 Fatty Acids (FISH OIL) 1000 MG CAPS Take by mouth.    [provider]  pantoprazole (PROTONIX) 40 MG tablet TAKE 1 TABLET BY MOUTH EVERY DAY 10/10/22   Karie Schwalbe, MD  traZODone (DESYREL) 50 MG tablet Take 1-2 tablets (50-100 mg total) by mouth at  bedtime. 06/06/22   Karie Schwalbe, MD    Family History    Family History  Problem Relation Age of Onset   Coronary artery disease Mother    Stroke Mother    Coronary artery disease Brother    Cancer Brother        colon or rectal   Colon cancer Brother 30       half-brother    Diabetes Neg Hx    Stomach cancer Neg Hx    He indicated that his mother is deceased. He indicated that his father is deceased. He indicated that his sister is alive. He indicated that two of his four brothers are alive. He indicated that the status of his neg hx is unknown.  Social History    Social History   Socioeconomic History   Marital status: Married    Spouse name: Merchandiser, retail   Number of children: 4   Years of education: Not on file   Highest education level: High school graduate  Occupational History   Occupation: installs signs    Employer: [employer]  Tobacco Use   Smoking status: Former    Current packs/day: 0.00    Types: Cigarettes    Start date: 10/28/1970    Quit date: 10/27/2005    Years since quitting:  17.2   Smokeless tobacco: Never  Vaping Use   Vaping status: Never Used  Substance and Sexual Activity   Alcohol use: Yes    Comment: once a month-beer    Drug use: No   Sexual activity: Not on file  Other Topics Concern   Not on file  Social History Narrative   No living will   Wife should be health care POA---alternate is son   Would accept resuscitation   Not sure about tube feeds   Social Determinants of Health   Financial Resource Strain: Low Risk  (06/11/2018)   Overall Financial Resource Strain (CARDIA)    Difficulty of Paying Living Expenses: Not hard at all  Food Insecurity: No Food Insecurity (06/11/2018)   Hunger Vital Sign    Worried About Running Out of Food in the Last Year: Never true    Ran Out of Food in the Last Year: Never true  Transportation Needs: No Transportation Needs (06/11/2018)   PRAPARE - Administrator, Civil Service (Medical): No    Lack of Transportation (Non-Medical): No  Physical Activity: Sufficiently Active (07/18/2022)   Exercise Vital Sign    Days of Exercise per Week: 6 days    Minutes of Exercise per Session: 150+ min  Stress: No Stress Concern Present (07/18/2022)   [employer] of Occupational Health - Occupational Stress Questionnaire    Feeling of Stress : Not at all  Social Connections: Moderately Isolated (07/18/2022)   Social Connection and Isolation Panel [NHANES]    Frequency of Communication with Friends and Family: More than three times a week    Frequency of Social Gatherings with Friends and Family: Once a week    Attends Religious Services: Never    Database administrator or Organizations: No    Attends Engineer, structural: Not on file    Marital Status: Married  Intimate Partner Violence: Unknown (08/01/2021)   Received from [employer]   HITS    Physically Hurt: Not on file    Insult or Talk Down To: Not on file    Threaten Physical Harm: Not on file    Scream or Curse:  Not on  file     Review of Systems    General:  No chills, fever, night sweats or weight changes.  Cardiovascular:  No chest pain, dyspnea on exertion, edema, orthopnea, palpitations, paroxysmal nocturnal dyspnea. Dermatological: No rash, lesions/masses Respiratory: No cough, dyspnea Urologic: No hematuria, dysuria Abdominal:   No nausea, vomiting, diarrhea, bright red blood per rectum, melena, or hematemesis Neurologic:  No visual changes, wkns, changes in mental status. All other systems reviewed and are otherwise negative except as noted above.  Physical Exam    VS:  BP 134/70   Pulse 66   Wt 240 lb 6.4 oz (109 kg)   SpO2 94%   BMI 34.49 kg/m  , BMI Body mass index is 34.49 kg/m. GEN: Well nourished, well developed, in no acute distress. HEENT: normal. Neck: Supple, no JVD, carotid bruits, or masses. Cardiac: RRR, no murmurs, rubs, or gallops. No clubbing, cyanosis, edema.  Radials/DP/PT 2+ and equal bilaterally.  Respiratory:  Respirations regular and unlabored, clear to auscultation bilaterally. GI: Soft, nontender, nondistended, BS + x 4. MS: no deformity or atrophy. Skin: warm and dry, no rash. Neuro:  Strength and sensation are intact. Psych: Normal affect.  Accessory Clinical Findings    Recent Labs: 03/12/2022: ALT 22 10/28/2022: BUN 19; Creatinine, Ser 1.23; Hemoglobin 14.8; Platelets 262; Potassium 3.8; Sodium 140   Recent Lipid Panel    Component Value Date/Time   CHOL 140 03/12/2022 0822   CHOL 127 06/01/2019 1020   TRIG 99.0 03/12/2022 0822   HDL 43.50 03/12/2022 0822   HDL 57 06/01/2019 1020   CHOLHDL 3 03/12/2022 0822   VLDL 19.8 03/12/2022 0822   LDLCALC 77 03/12/2022 0822   LDLCALC 55 06/01/2019 1020   LDLDIRECT 171.0 12/25/2012 0950         ECG personally reviewed by me today-none today.  Echocardiogram 11/26/2022  IMPRESSIONS   1. Left ventricular ejection fraction, by estimation, is 60 to 65%. Left ventricular ejection fraction by 3D volume  is 62 %. The left ventricle has normal function. The left ventricle has no regional wall motion abnormalities. There is mild concentric  left ventricular hypertrophy. Left ventricular diastolic parameters were normal. 2. Right ventricular systolic function is normal. The right ventricular size is mildly enlarged. There is normal pulmonary artery systolic pressure. The estimated right ventricular systolic pressure is 25.8 mmHg. 3. Right atrial size was mildly dilated. 4. The mitral valve is grossly normal. Trivial mitral valve regurgitation. No evidence of mitral stenosis. 5. The aortic valve is tricuspid. Aortic valve regurgitation is not visualized. No aortic stenosis is present. 6. The inferior vena cava is normal in size with greater than 50% respiratory variability, suggesting right atrial pressure of 3 mmHg.  FINDINGS Left Ventricle: Left ventricular ejection fraction, by estimation, is 60 to 65%. Left ventricular ejection fraction by 3D volume is 62 %. The left ventricle has normal function. The left ventricle has no regional wall motion abnormalities. The left  ventricular internal cavity size was normal in size. There is mild concentric left ventricular hypertrophy. Left ventricular diastolic parameters were normal.  Right Ventricle: The right ventricular size is mildly enlarged. No increase in right ventricular wall thickness. Right ventricular systolic function is normal. There is normal pulmonary artery systolic pressure. The tricuspid regurgitant velocity is 2.39 m/s, and with an assumed right atrial pressure of 3 mmHg, the estimated right ventricular systolic pressure is 25.8 mmHg.  Left Atrium: Left atrial size was normal in size.  Right Atrium: Right  atrial size was mildly dilated.  Pericardium: There is no evidence of pericardial effusion.  Mitral Valve: The mitral valve is grossly normal. Trivial mitral valve regurgitation. No evidence of mitral valve stenosis.  Tricuspid  Valve: The tricuspid valve is grossly normal. Tricuspid valve regurgitation is mild . No evidence of tricuspid stenosis.  Aortic Valve: The aortic valve is tricuspid. Aortic valve regurgitation is not visualized. No aortic stenosis is present.  Pulmonic Valve: The pulmonic valve was grossly normal. Pulmonic valve regurgitation is trivial. No evidence of pulmonic stenosis.  Aorta: The aortic root and ascending aorta are structurally normal, with no evidence of dilitation.  Venous: The right lower pulmonary vein is normal. The inferior vena cava is normal in size with greater than 50% respiratory variability, suggesting right atrial pressure of 3 mmHg.  IAS/Shunts: The atrial septum is grossly normal.   Nuclear stress test 05/30/2022  The study is normal. The study is low risk.  No ST deviation was noted.  LV perfusion is normal. There is no evidence of ischemia. There is no evidence of infarction.  Left ventricular function is normal. Nuclear stress EF: 63 %. The left ventricular ejection fraction is normal (55-65%). End diastolic cavity size is normal. End systolic cavity size is normal.  Prior study available for comparison from 05/30/2020.  Normal resting and stress perfusion. No ischemia or infarction EF 63%    Assessment & Plan   1.  Atypical chest pain-denies recent episodes of chest pain.  Seen in the emergency department on 10/28/2022.  Subsequent echocardiogram reassuring with normal EF.  Details above.  No plans for ischemic evaluation at this time. Maintain physical activity Continue current medical therapy  Hyperlipidemia-LDL 77on 03/12/22.  Intolerant of statins and Repatha Continue Nexlizet High-fiber diet Repeat fasting lipids and LFTs-not fasting today we will have fasting lipids and LFTs with his PCP.  Request results sent to our office.  Essential hypertension-BP today 134/70. Maintain blood pressure log Continue current medical therapy   Coronary artery disease-no  chest pain today.  STEMI 12/19 with cardiac catheterization and emergent CABG (LIMA-LAD and SVG-diagonal.  Stress testing 2/24 showed no ischemia and low risk.  Details above. Continue current medical therapy Heart healthy low-sodium diet Maintain physical activity  Disposition: Follow-up with Dr. Allyson Sabal or me in 9-12 months months.   Thomasene Ripple. Ameena Vesey NP-C     02/04/2023, 8:16 AM [facility] [address] Office [phone] Fax [phone]    I spent 14 minutes examining this patient, reviewing medications, and using patient centered shared decision making involving her cardiac care.  Prior to her visit I spent greater than 20 minutes reviewing her past medical history,  medications, and prior cardiac tests.

## 2023-02-04 ENCOUNTER — Encounter: Payer: Self-pay | Admitting: General Practice

## 2023-02-04 ENCOUNTER — Ambulatory Visit: Payer: No Typology Code available for payment source | Attending: General Practice | Admitting: General Practice

## 2023-02-04 VITALS — BP 134/70 | HR 66 | Wt 240.4 lb

## 2023-02-04 DIAGNOSIS — R0789 Other chest pain: Secondary | ICD-10-CM

## 2023-02-04 DIAGNOSIS — E782 Mixed hyperlipidemia: Secondary | ICD-10-CM | POA: Diagnosis not present

## 2023-02-04 DIAGNOSIS — I2510 Atherosclerotic heart disease of native coronary artery without angina pectoris: Secondary | ICD-10-CM | POA: Diagnosis not present

## 2023-02-04 DIAGNOSIS — I10 Essential (primary) hypertension: Secondary | ICD-10-CM | POA: Diagnosis not present

## 2023-02-04 NOTE — Patient Instructions (Addendum)
Medication Instructions:  The current medical regimen is effective;  continue present plan and medications as directed. Please refer to the Current Medication list given to you today.  *If you need a refill on your cardiac medications before your next appointment, please call your pharmacy*  Lab Work: HAVE YOU LIPID PANEL AND LFT WITH YOUR PRIMARY MD IN MID-NOVEMBER AND HAVE THEM FAX RESULTS TO [phone] If you have labs (blood work) drawn today and your tests are completely normal, you will receive your results only by:    MyChart Message (if you have MyChart) OR   A paper copy in the mail If you have any lab test that is abnormal or we need to change your treatment, we will call you to review the results.  Other Instructions MAINTAIN PHYSICAL ACTIVITY TAKE AND LOG YOUR BLOOD PRESSURE ONCE A WEEK PLEASE FOLLOW INCREASED FIBER DIET-ATTACHED  Follow-Up: At [facility], you and your health needs are our priority.  As part of our continuing mission to provide you with exceptional heart care, we have created designated Provider Care Teams.  These Care Teams include your primary Cardiologist (physician) and Advanced Practice Providers (APPs -  Physician Assistants and Nurse Practitioners) who all work together to provide you with the care you need, when you need it.  Your next appointment:   9-12 month(s)  Provider:   Nanetta Batty, MD  or Edd Fabian, FNP        High-Fiber Eating Plan Fiber, also called dietary fiber, is found in foods such as fruits, vegetables, whole grains, and beans. A high-fiber diet can be good for your health. Your health care provider may recommend a high-fiber diet to help: Prevent trouble pooping (constipation). Lower your cholesterol. Treat the following conditions: Hemorrhoids. This is inflammation of veins in the anus. Inflammation of specific areas of the digestive tract. Irritable bowel syndrome (IBS). This is a problem of the large  intestine, also called the colon, that sometimes causes belly pain and bloating. Prevent overeating as part of a weight-loss plan. Lower the risk of heart disease, type 2 diabetes, and certain cancers. What are tips for following this plan? Reading food labels  Check the nutrition facts label on foods for the amount of dietary fiber. Choose foods that have 4 grams of fiber or more per serving.  Shopping Choose whole fruits and vegetables instead of processed. For example, choose apples instead of apple juice or applesauce. Choose a variety of high-fiber foods such as avocados, lentils, oats, and pinto beans. Read the nutrition facts label on foods. Check for foods with added fiber. These foods often have high sugar and salt (sodium) amounts per serving. Cooking Use whole-grain flour for baking and cooking. Cook with brown rice instead of white rice. Make meals that have a lot of beans and vegetables in them, such as chili or vegetable-based soups. Meal planning Start the day with a breakfast that is high in fiber, such as a cereal that has 5 g of fiber or more per serving. Eat breads and cereals that are made with whole-grain flour instead of refined flour or white flour. Eat brown rice, bulgur wheat, or millet instead of white rice. Use beans in place of meat in soups, salads, and pasta dishes. Be sure that half of the grains you eat each day are whole grains. General information You can get the recommended amount of dietary fiber by: Eating a variety of fruits, vegetables, grains, nuts, and beans. Taking a fiber supplement if you  aren't able to eat enough fiber. It's better to get fiber through food than from a supplement. Slowly increase how much fiber you eat. If you increase the amount of fiber you eat too quickly, you may have bloating, cramping, or gas. Drink plenty of water to help you digest fiber. Choose high-fiber snacks, such as berries, raw vegetables, nuts, and  popcorn. What foods should I eat? Fruits Berries. Pears. Apples. Oranges. Avocado. Prunes and raisins. Dried figs. Vegetables Sweet potatoes. Spinach. Kale. Artichokes. Cabbage. Broccoli. Cauliflower. Green peas. Carrots. Squash. Grains Whole-grain breads. Multigrain cereal. Oats and oatmeal. Brown rice. Barley. Bulgur wheat. Millet. Quinoa. Bran muffins. Popcorn. Rye wafer crackers. Meats and other proteins Navy beans, kidney beans, and pinto beans. Soybeans. Split peas. Lentils. Nuts and seeds. Dairy Fiber-fortified yogurt. Fortified means that fiber has been added to the product. Beverages Fiber-fortified soy milk. Fiber-fortified orange juice. Other foods Fiber bars. The items listed above may not be all the foods and drinks you can have. Talk to a dietitian to learn more. What foods should I avoid? Fruits Fruit juice. Cooked, strained fruit. Vegetables Fried potatoes. Canned vegetables. Well-cooked vegetables. Grains White bread. Pasta made with refined flour. White rice. Meats and other proteins Fatty meat. Fried chicken or fried fish. Dairy Milk. Cream cheese. Sour cream. Fats and oils Butters. Beverages Soft drinks. Other foods Cakes and pastries. The items listed above may not be all the foods and drinks you should avoid. Talk to a dietitian to learn more. This information is not intended to replace advice given to you by your health care provider. Make sure you discuss any questions you have with your health care provider. Document Revised: 07/08/2022 Document Reviewed: 07/08/2022 Elsevier Patient Education  2024 [employer].

## 2023-02-12 ENCOUNTER — Other Ambulatory Visit: Payer: Self-pay

## 2023-02-12 ENCOUNTER — Encounter (HOSPITAL_BASED_OUTPATIENT_CLINIC_OR_DEPARTMENT_OTHER): Payer: Self-pay | Admitting: Emergency Medicine

## 2023-02-12 ENCOUNTER — Other Ambulatory Visit (HOSPITAL_BASED_OUTPATIENT_CLINIC_OR_DEPARTMENT_OTHER): Payer: Self-pay

## 2023-02-12 ENCOUNTER — Emergency Department (HOSPITAL_BASED_OUTPATIENT_CLINIC_OR_DEPARTMENT_OTHER)
Admission: EM | Admit: 2023-02-12 | Discharge: 2023-02-12 | Disposition: A | Discharge status: Home / Self Care | Payer: Worker's Compensation | Attending: Emergency Medicine | Admitting: Emergency Medicine

## 2023-02-12 DIAGNOSIS — Z23 Encounter for immunization: Secondary | ICD-10-CM | POA: Insufficient documentation

## 2023-02-12 DIAGNOSIS — S6992XA Unspecified injury of left wrist, hand and finger(s), initial encounter: Secondary | ICD-10-CM | POA: Diagnosis present

## 2023-02-12 DIAGNOSIS — Y990 Civilian activity done for income or pay: Secondary | ICD-10-CM | POA: Diagnosis not present

## 2023-02-12 DIAGNOSIS — S61412A Laceration without foreign body of left hand, initial encounter: Secondary | ICD-10-CM | POA: Insufficient documentation

## 2023-02-12 DIAGNOSIS — Z7982 Long term (current) use of aspirin: Secondary | ICD-10-CM | POA: Diagnosis not present

## 2023-02-12 DIAGNOSIS — W260XXA Contact with knife, initial encounter: Secondary | ICD-10-CM | POA: Insufficient documentation

## 2023-02-12 MED ORDER — CEPHALEXIN 500 MG PO CAPS: 500.0000 mg | ORAL_CAPSULE | Freq: Three times a day (TID) | ORAL | 0 refills | Status: AC

## 2023-02-12 MED ORDER — CEPHALEXIN 500 MG PO CAPS
500.0000 mg | ORAL_CAPSULE | Freq: Three times a day (TID) | ORAL | 0 refills | Status: DC
  Filled 2023-02-12: qty 15, 5d supply, fill #0

## 2023-02-12 MED ORDER — LIDOCAINE-EPINEPHRINE (PF) 2 %-1:200000 IJ SOLN
10.0000 mL | Freq: Once | INTRAMUSCULAR | Status: AC
  Administered 2023-02-12: 10 mL
  Filled 2023-02-12: qty 20

## 2023-02-12 MED ORDER — CEPHALEXIN 500 MG PO CAPS: 500.0000 mg | ORAL_CAPSULE | Freq: Three times a day (TID) | ORAL | 0 refills | Status: DC

## 2023-02-12 MED ORDER — TETANUS-DIPHTH-ACELL PERTUSSIS 5-2.5-18.5 LF-MCG/0.5 IM SUSY
0.5000 mL | PREFILLED_SYRINGE | Freq: Once | INTRAMUSCULAR | Status: AC
  Administered 2023-02-12: 0.5 mL via INTRAMUSCULAR
  Filled 2023-02-12: qty 0.5

## 2023-02-12 NOTE — ED Notes (Signed)
ED Provider at bedside. 

## 2023-02-12 NOTE — Discharge Instructions (Addendum)
Keep wound clean and dry. Take Keflex as prescribed and complete the full course. Take Tylenol as needed as directed for pain.  Recheck with worker's comp in 2 days, suture removal in about 12 days.   You may work but limit gripping with your left hand. Keep wound covered while at work. Do not allow wound to be wet (like in a glove or water).

## 2023-02-12 NOTE — ED Provider Notes (Signed)
[place] [facility] Provider Note   CSN: [id number] Arrival date & time: 02/12/23  1503     History  Chief Complaint  Patient presents with   Laceration    Orlander Birdwell is a 67 y.o. male.  67 year old male presents for laceration to left hand.  Patient was at work using a knife to cut zip ties when he accidentally cut the left thenar eminence area.  Quick clot product powder was applied to the wound which continued to bleed, a pressure dressing was applied with cotton cloth and Coban.  Arrives emergency room with bleeding controlled.  Last tetanus is less than 5 years ago.  No other complaints or concerns.       Home Medications Prior to Admission medications   Medication Sig Start Date End Date Taking? Authorizing Provider  aspirin EC 81 MG tablet Take 81 mg by mouth daily. Swallow whole.    [provider]  Bempedoic Acid-Ezetimibe (NEXLIZET) 180-10 MG TABS Take 1 tablet by mouth daily. 09/04/22   Runell Gess, MD  cephALEXin (KEFLEX) 500 MG capsule Take 1 capsule (500 mg total) by mouth 3 (three) times daily for 5 days. 02/12/23 02/17/23  Jeannie Fend, PA-C  fluticasone (FLONASE) 50 MCG/ACT nasal spray SPRAY 2 SPRAYS INTO EACH NOSTRIL EVERY DAY 06/06/22   Karie Schwalbe, MD  HYDROcodone bit-homatropine (HYCODAN) 5-1.5 MG/5ML syrup Take 5 mLs by mouth at bedtime as needed for cough. 07/18/22   Karie Schwalbe, MD  loratadine (CLARITIN) 10 MG tablet Take 10 mg by mouth daily.    [provider]  metoprolol tartrate (LOPRESSOR) 25 MG tablet TAKE 0.5 TABLETS BY MOUTH 2 TIMES DAILY. 06/06/22   Runell Gess, MD  Multiple Vitamin (MULTIVITAMIN) tablet Take 1 tablet by mouth daily.    [provider]  Omega-3 Fatty Acids (FISH OIL) 1000 MG CAPS Take by mouth.    [provider]  pantoprazole (PROTONIX) 40 MG tablet TAKE 1 TABLET BY MOUTH EVERY DAY 10/10/22   Karie Schwalbe, MD  traZODone (DESYREL) 50  MG tablet Take 1-2 tablets (50-100 mg total) by mouth at bedtime. 06/06/22   Karie Schwalbe, MD      Allergies    Atorvastatin, Morphine and codeine, Repatha  [evolocumab], Simvastatin, and Pravachol [pravastatin sodium]    Review of Systems   Review of Systems Negative except as per HPI Physical Exam Updated Vital Signs BP (!) 140/88 (BP Location: Right Arm)   Pulse 88   Temp 98 F (36.7 C)   Resp 18   Ht 5' 10.5" (1.791 m)   Wt 108.9 kg   SpO2 100%   BMI 33.95 kg/m  Physical Exam Vitals and nursing note reviewed.  Constitutional:      General: He is not in acute distress.    Appearance: He is well-developed. He is not diaphoretic.  HENT:     Head: Normocephalic and atraumatic.  Cardiovascular:     Pulses: Normal pulses.  Pulmonary:     Effort: Pulmonary effort is normal.  Musculoskeletal:        General: Signs of injury present.     Comments: Laceration to thenar eminence of left hand, bleeding controlled.  Motor and sensation intact, brisk capillary refill present.  Skin:    General: Skin is warm and dry.     Capillary Refill: Capillary refill takes less than 2 seconds.     Findings: No erythema or rash.  Neurological:  Mental Status: He is alert and oriented to person, place, and time.     Sensory: No sensory deficit.     Motor: No weakness.  Psychiatric:        Behavior: Behavior normal.     ED Results / Procedures / Treatments   Labs (all labs ordered are listed, but only abnormal results are displayed) Labs Reviewed - No data to display  EKG None  Radiology No results found.  Procedures .Marland KitchenLaceration Repair  Date/Time: 02/12/2023 5:48 PM  Performed by: Jeannie Fend, PA-C Authorized by: Jeannie Fend, PA-C   Consent:    Consent obtained:  Verbal   Consent given by:  Patient   Risks, benefits, and alternatives were discussed: yes     Risks discussed:  Infection, need for additional repair, nerve damage, pain, poor cosmetic result,  poor wound healing, tendon damage and retained foreign body   Alternatives discussed:  No treatment Universal protocol:    Patient identity confirmed:  Verbally with patient Anesthesia:    Anesthesia method:  Local infiltration   Local anesthetic:  Lidocaine 2% WITH epi Laceration details:    Location:  Hand   Hand location:  L palm   Length (cm):  6   Depth (mm):  4 Pre-procedure details:    Preparation:  Patient was prepped and draped in usual sterile fashion Exploration:    Hemostasis achieved with:  Epinephrine   Wound exploration: wound explored through full range of motion and entire depth of wound visualized     Wound extent: no foreign body, no signs of injury and no tendon damage     Contaminated: yes   Treatment:    Area cleansed with:  Saline   Amount of cleaning:  Extensive   Irrigation solution:  Sterile saline   Irrigation method:  Pressure wash   Debridement:  None   Undermining:  None Skin repair:    Repair method:  Sutures   Suture size:  4-0   Suture material:  Prolene   Number of sutures:  5 Approximation:    Approximation:  Close Repair type:    Repair type:  Intermediate Post-procedure details:    Dressing:  Open (no dressing)   Procedure completion:  Tolerated well, no immediate complications     Medications Ordered in ED Medications  lidocaine-EPINEPHrine (XYLOCAINE W/EPI) 2 %-1:200000 (PF) injection 10 mL (10 mLs Infiltration Given by Other 02/12/23 1643)  Tdap (BOOSTRIX) injection 0.5 mL (0.5 mLs Intramuscular Given 02/12/23 1725)    ED Course/ Medical Decision Making/ A&P                                 Medical Decision Making Risk Prescription drug management.   This patient presents to the ED for concern of left hand laceration, this involves an extensive number of treatment options, and is a complaint that carries with it a high risk of complications and morbidity.  The differential diagnosis includes but not limited to  neurovascular injury, msk injury, soft tissue injury   Co morbidities that complicate the patient evaluation  GERD, DLH, prediabetes, STEMI, HTN   Problem List / ED Course / Critical interventions / Medication management    67 year old right-hand-dominant male presents with laceration to left palm.  Patient accidentally cut hand while using a box cutter to cut zip ties at work today.  Bleeding was difficult to control and a clotting product powder was applied  to the hand followed by a pressure dressing.  On arrival, bleeding was controlled.  Hand was soaked and irrigated.  Discussed patient potential for retained foreign body from the clotting product.  The wound was closed without difficulty.  He is discharged on Keflex due to concern for continued wound.  Tetanus is updated.  Recommend recheck with Worker's Comp. provider in 2 days and suture removal in about 12 days. I ordered medication including tetanus, lidocaine for left hand wound Reevaluation of the patient after these medicines showed that the patient improved I have reviewed the patients home medicines and have made adjustments as needed   Social Determinants of Health:  Worker's Comp. related injury   Test / Admission - Considered:  Stable for discharge         Final Clinical Impression(s) / ED Diagnoses Final diagnoses:  Laceration of left hand, foreign body presence unspecified, initial encounter    Rx / DC Orders ED Discharge Orders          Ordered    cephALEXin (KEFLEX) 500 MG capsule  3 times daily,   Status:  Discontinued        02/12/23 1713    cephALEXin (KEFLEX) 500 MG capsule  3 times daily        02/12/23 1714              Jeannie Fend, PA-C 02/12/23 1750    Arby Barrette, MD 02/22/23 1600

## 2023-02-12 NOTE — ED Triage Notes (Signed)
Pt w/ laceration to hand from knife while at work

## 2023-02-12 NOTE — ED Notes (Signed)
Pts employer requesting drug screen .

## 2023-02-12 NOTE — ED Notes (Signed)
Drug screen performed via chain of custody form per employer request.

## 2023-02-12 NOTE — ED Notes (Signed)
Left hand soaking in sterile water and betadine

## 2023-02-12 NOTE — ED Notes (Signed)
[employer] dressing per EDO request. Wrapped left hand with gauze. Educated pt on wound care and follow up.

## 2023-02-13 ENCOUNTER — Other Ambulatory Visit (HOSPITAL_BASED_OUTPATIENT_CLINIC_OR_DEPARTMENT_OTHER): Payer: Self-pay

## 2023-02-24 ENCOUNTER — Ambulatory Visit: Payer: No Typology Code available for payment source

## 2023-03-18 ENCOUNTER — Telehealth: Payer: Self-pay | Admitting: Internal Medicine

## 2023-03-18 ENCOUNTER — Ambulatory Visit (INDEPENDENT_AMBULATORY_CARE_PROVIDER_SITE_OTHER): Payer: No Typology Code available for payment source | Admitting: Internal Medicine

## 2023-03-18 ENCOUNTER — Encounter: Payer: Self-pay | Admitting: Internal Medicine

## 2023-03-18 VITALS — BP 126/74 | HR 60 | Temp 98.1°F | Ht 69.5 in | Wt 240.0 lb

## 2023-03-18 DIAGNOSIS — Z0000 Encounter for general adult medical examination without abnormal findings: Secondary | ICD-10-CM

## 2023-03-18 DIAGNOSIS — I2510 Atherosclerotic heart disease of native coronary artery without angina pectoris: Secondary | ICD-10-CM

## 2023-03-18 DIAGNOSIS — Z1159 Encounter for screening for other viral diseases: Secondary | ICD-10-CM

## 2023-03-18 DIAGNOSIS — K219 Gastro-esophageal reflux disease without esophagitis: Secondary | ICD-10-CM

## 2023-03-18 DIAGNOSIS — R7303 Prediabetes: Secondary | ICD-10-CM | POA: Diagnosis not present

## 2023-03-18 LAB — COMPREHENSIVE METABOLIC PANEL
ALT: 25 U/L (ref 0–53)
AST: 26 U/L (ref 0–37)
Albumin: 4.6 g/dL (ref 3.5–5.2)
Alkaline Phosphatase: 83 U/L (ref 39–117)
BUN: 19 mg/dL (ref 6–23)
CO2: 28 meq/L (ref 19–32)
Calcium: 9.6 mg/dL (ref 8.4–10.5)
Chloride: 106 meq/L (ref 96–112)
Creatinine, Ser: 1 mg/dL (ref 0.40–1.50)
GFR: 77.63 mL/min (ref >60.00)
Glucose, Bld: 125 mg/dL — ABNORMAL HIGH (ref 70–99)
Potassium: 4.4 meq/L (ref 3.5–5.1)
Sodium: 142 meq/L (ref 135–145)
Total Bilirubin: 0.8 mg/dL (ref 0.2–1.2)
Total Protein: 7.1 g/dL (ref 6.0–8.3)

## 2023-03-18 LAB — LIPID PANEL
Cholesterol: 137 mg/dL (ref 0–200)
HDL: 39.5 mg/dL (ref >39.00)
LDL Cholesterol: 76 mg/dL (ref 0–99)
NonHDL: 97.08
Total CHOL/HDL Ratio: 3
Triglycerides: 104 mg/dL (ref 0.0–149.0)
VLDL: 20.8 mg/dL (ref 0.0–40.0)

## 2023-03-18 LAB — HEMOGLOBIN A1C: Hgb A1c MFr Bld: 6.5 % (ref 4.6–6.5)

## 2023-03-18 LAB — CBC
HCT: 46.7 % (ref 39.0–52.0)
Hemoglobin: 15.6 g/dL (ref 13.0–17.0)
MCHC: 33.5 g/dL (ref 30.0–36.0)
MCV: 89.9 fL (ref 78.0–100.0)
Platelets: 290 10*3/uL (ref 150.0–400.0)
RBC: 5.19 Mil/uL (ref 4.22–5.81)
RDW: 13.7 % (ref 11.5–15.5)
WBC: 7.4 10*3/uL (ref 4.0–10.5)

## 2023-03-18 NOTE — Assessment & Plan Note (Signed)
Discussed lifestyle Will check labs--had been on metformin in the past

## 2023-03-18 NOTE — Telephone Encounter (Signed)
Patient currently sees Dr. Alphonsus Sias in the office, upon his retirement he would like to transfer his care to Dr. Sharen Hones. Dr. Alphonsus Sias has okayed this, okay with Dr. Reece Agar to set this up?

## 2023-03-18 NOTE — Assessment & Plan Note (Signed)
Healthy Discussed fitness and working towards weight loss Colon due 2028 Still prefers no PSA or COVID vaccine Had flu vaccine Recommended shingrix

## 2023-03-18 NOTE — Telephone Encounter (Signed)
Have scheduled patient for TOC, thanks all

## 2023-03-18 NOTE — Telephone Encounter (Signed)
Ok to transfer care thanks.

## 2023-03-18 NOTE — Assessment & Plan Note (Signed)
Quiet on the pantoprazole

## 2023-03-18 NOTE — Progress Notes (Signed)
Subjective:    Patient ID: [name], male    DOB: Mar 12, 1956, 67 y.o.   MRN: [id number]  HPI Here for physical  No new concerns Has a cough--goes back a week Doesn't feel sick---using left over cough med for sleep  Walks a lot at work--- 6 miles Lifting etc at work No sugared drinks---but likes desserts. Not much fast food and usually eat in  Did go to cardiologist after chest pain visit to ER Echo reassuring No further testing  Current Outpatient Medications on File Prior to Visit  Medication Sig Dispense Refill   aspirin EC 81 MG tablet Take 81 mg by mouth daily. Swallow whole.     Bempedoic Acid-Ezetimibe (NEXLIZET) 180-10 MG TABS Take 1 tablet by mouth daily. 90 tablet 3   fluticasone (FLONASE) 50 MCG/ACT nasal spray SPRAY 2 SPRAYS INTO EACH NOSTRIL EVERY DAY 48 mL 3   HYDROcodone bit-homatropine (HYCODAN) 5-1.5 MG/5ML syrup Take 5 mLs by mouth at bedtime as needed for cough. 120 mL 0   loratadine (CLARITIN) 10 MG tablet Take 10 mg by mouth daily.     metoprolol tartrate (LOPRESSOR) 25 MG tablet TAKE 0.5 TABLETS BY MOUTH 2 TIMES DAILY. 90 tablet 3   Multiple Vitamin (MULTIVITAMIN) tablet Take 1 tablet by mouth daily.     Omega-3 Fatty Acids (FISH OIL) 1000 MG CAPS Take by mouth.     pantoprazole (PROTONIX) 40 MG tablet TAKE 1 TABLET BY MOUTH EVERY DAY 90 tablet 3   traZODone (DESYREL) 50 MG tablet Take 1-2 tablets (50-100 mg total) by mouth at bedtime. 180 tablet 3   No current facility-administered medications on file prior to visit.    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   LIMA to LAD, SVG to D1, EVH via right thigh   Sleep disorder    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) 04/12/2018   Wrist fracture     6 foot fall onto head, concussion, L wrist (comminuted with intra-articular extension) and 2nd finger (middle phalanx) fxs, vertigo    Past Surgical History:  Procedure Laterality Date   BRANCHIAL CLEFT CYST EXCISION     CARDIAC CATHETERIZATION     COLONOSCOPY  04/03/2007   CORONARY ARTERY BYPASS GRAFT N/A 04/12/2018   Procedure: CORONARY ARTERY BYPASS GRAFTING (CABG) x two , using left internal mammary artery and right leg greater saphenous vein harvested endoscopically;  Surgeon: Purcell Nails, MD;  Location: [facility] OR;  Service: Open Heart Surgery;  Laterality: N/A;   CORONARY/GRAFT ACUTE MI REVASCULARIZATION N/A 04/12/2018   Procedure: Coronary/Graft Acute MI Revascularization;  Surgeon: Tonny Bollman, MD;  Location: [facility] INVASIVE CV LAB;  Service: Cardiovascular;  Laterality: N/A;   HEMORRHOID SURGERY     LEFT HEART CATH AND CORONARY ANGIOGRAPHY N/A 04/12/2018  Procedure: LEFT HEART CATH AND CORONARY ANGIOGRAPHY;  Surgeon: Tonny Bollman, MD;  Location: [facility] CV LAB;  Service: Cardiovascular;  Laterality: N/A;   RHINOPLASTY     TEE WITHOUT CARDIOVERSION N/A 04/12/2018   Procedure: TRANSESOPHAGEAL ECHOCARDIOGRAM (TEE);  Surgeon: Purcell Nails, MD;  Location: [facility] OR;  Service: Open Heart Surgery;  Laterality: N/A;   TOTAL KNEE ARTHROPLASTY Right 11/21/2020   Procedure: TOTAL KNEE ARTHROPLASTY;  Surgeon: Durene Romans, MD;  Location: [facility] ORS;  Service: Orthopedics;  Laterality: Right;    Family History  Problem Relation Age of Onset   Coronary artery disease Mother    Stroke Mother    Coronary artery disease Brother    Cancer Brother         colon or rectal   Colon cancer Brother 30       half-brother    Diabetes Neg Hx    Stomach cancer Neg Hx     Social History   Socioeconomic History   Marital status: Married    Spouse name: Merchandiser, retail   Number of children: 4   Years of education: Not on file   Highest education level: High school graduate  Occupational History   Occupation: installs signs    Employer: [employer]  Tobacco Use   Smoking status: Former    Current packs/day: 0.00    Types: Cigarettes    Start date: 10/28/1970    Quit date: 10/27/2005    Years since quitting: 17.4   Smokeless tobacco: Never  Vaping Use   Vaping status: Never Used  Substance and Sexual Activity   Alcohol use: Yes    Comment: once a month-beer    Drug use: No   Sexual activity: Not on file  Other Topics Concern   Not on file  Social History Narrative   No living will   Wife should be health care POA---alternate is son   Would accept resuscitation   Not sure about tube feeds   Social Determinants of Health   Financial Resource Strain: Low Risk  (06/11/2018)   Overall Financial Resource Strain (CARDIA)    Difficulty of Paying Living Expenses: Not hard at all  Food Insecurity: No Food Insecurity (06/11/2018)   Hunger Vital Sign    Worried About Running Out of Food in the Last Year: Never true    Ran Out of Food in the Last Year: Never true  Transportation Needs: No Transportation Needs (06/11/2018)   PRAPARE - Administrator, Civil Service (Medical): No    Lack of Transportation (Non-Medical): No  Physical Activity: Sufficiently Active (07/18/2022)   Exercise Vital Sign    Days of Exercise per Week: 6 days    Minutes of Exercise per Session: 150+ min  Stress: No Stress Concern Present (07/18/2022)   [employer] of Occupational Health - Occupational Stress Questionnaire    Feeling of Stress : Not at all  Social Connections: Moderately Isolated (07/18/2022)   Social Connection and Isolation Panel [NHANES]     Frequency of Communication with Friends and Family: More than three times a week    Frequency of Social Gatherings with Friends and Family: Once a week    Attends Religious Services: Never    Database administrator or Organizations: No    Attends Banker Meetings: Not on file    Marital Status: Married  Intimate Partner Violence: Unknown (08/01/2021)   Received from [employer]   HITS    Physically Hurt:  Not on file    Insult or Talk Down To: Not on file    Threaten Physical Harm: Not on file    Scream or Curse: Not on file   Review of Systems  Constitutional:  Negative for fatigue and unexpected weight change.       Wears seat belt  HENT:  Negative for dental problem, hearing loss and tinnitus.        Keeps up with dentist  Eyes:  Negative for visual disturbance.       Having cataracts removed in January  Respiratory:  Negative for chest tightness and shortness of breath.   Cardiovascular:  Negative for chest pain, palpitations and leg swelling.  Gastrointestinal:  Negative for blood in stool.       Occ mild constipation--no meds though No heartburn with daily pantoprazole  Endocrine: Negative for polydipsia and polyuria.  Genitourinary:  Negative for difficulty urinating and urgency.       No sexual problems  Musculoskeletal:  Negative for back pain and joint swelling.       Same hand pain--arthritis. Diclofenac gel not much help. Does take tylenol every morning  Skin:  Negative for rash.  Allergic/Immunologic: Positive for environmental allergies. Negative for immunocompromised state.       Does take flonase and claritin daily  Neurological:  Negative for dizziness, syncope, light-headedness and headaches.  Hematological:  Negative for adenopathy. Bruises/bleeds easily.  Psychiatric/Behavioral:  Negative for dysphoric mood. The patient is not nervous/anxious.        Some trouble initiating sleep--mind goes. Trazodone helps but dries him out--so  only occasional       Objective:   Physical Exam Constitutional:      Appearance: Normal appearance.  HENT:     Mouth/Throat:     Pharynx: No oropharyngeal exudate or posterior oropharyngeal erythema.  Eyes:     Conjunctiva/sclera: Conjunctivae normal.     Pupils: Pupils are equal, round, and reactive to light.  Cardiovascular:     Rate and Rhythm: Normal rate and regular rhythm.     Pulses: Normal pulses.     Heart sounds: No murmur heard.    No gallop.  Pulmonary:     Effort: Pulmonary effort is normal.     Breath sounds: Normal breath sounds. No wheezing or rales.  Abdominal:     Palpations: Abdomen is soft.     Tenderness: There is no abdominal tenderness.  Musculoskeletal:     Cervical back: Neck supple.     Right lower leg: No edema.     Left lower leg: No edema.  Lymphadenopathy:     Cervical: No cervical adenopathy.  Skin:    Findings: No lesion or rash.  Neurological:     General: No focal deficit present.     Mental Status: He is alert and oriented to person, place, and time.  Psychiatric:        Mood and Affect: Mood normal.        Behavior: Behavior normal.            Assessment & Plan:

## 2023-03-18 NOTE — Assessment & Plan Note (Signed)
Atypical chest pain recently Doing well now On ASA, nexlizet, metoprolol 12.5 bid

## 2023-03-20 LAB — HEPATITIS C ANTIBODY: Hepatitis C Ab: NONREACTIVE

## 2023-04-12 ENCOUNTER — Other Ambulatory Visit: Payer: Self-pay | Admitting: Internal Medicine

## 2023-04-14 ENCOUNTER — Telehealth: Payer: Self-pay | Admitting: Internal Medicine

## 2023-04-14 NOTE — Telephone Encounter (Signed)
Prescription Request  04/14/2023  LOV: 03/18/2023  What is the name of the medication or equipment? amoxicillin (AMOXIL) 500 MG tablet  Have you contacted your pharmacy to request a refill? Yes   Which pharmacy would you like this sent to?  [id number]en,  - [address] [place] [address] Phone: [phone] Fax: [phone]     Patient notified that their request is being sent to the clinical staff for review and that they should receive a response within 2 business days.   Please advise at Mobile [phone] (mobile)  Pt's wife, Darcella Cheshire, states the pt is seeing his dentist on Wed, 12/18 & needs meds before visit. [name] states the dentist it requiring pt have meds due to recent knee replacement. Meds were on discontinued list.

## 2023-04-14 NOTE — Telephone Encounter (Signed)
We received the refill request form the pharmacy and it has been sent to Dr Alphonsus Sias to approve.

## 2023-05-07 ENCOUNTER — Other Ambulatory Visit: Payer: Self-pay | Admitting: Internal Medicine

## 2023-05-08 MED ORDER — HYDROCODONE BIT-HOMATROP MBR 5-1.5 MG/5ML PO SOLN: 5.0000 mL | Freq: Every evening | ORAL | 0 refills | Status: DC | PRN

## 2023-05-25 ENCOUNTER — Other Ambulatory Visit: Payer: Self-pay | Admitting: Cardiovascular Disease

## 2023-06-06 ENCOUNTER — Other Ambulatory Visit: Payer: Self-pay | Admitting: Internal Medicine

## 2023-06-30 ENCOUNTER — Encounter: Payer: Self-pay | Admitting: Internal Medicine

## 2023-07-01 ENCOUNTER — Telehealth: Payer: Self-pay | Admitting: Cardiovascular Disease

## 2023-07-01 ENCOUNTER — Ambulatory Visit: Attending: Cardiology | Admitting: Cardiology

## 2023-07-01 ENCOUNTER — Encounter: Payer: Self-pay | Admitting: Cardiology

## 2023-07-01 VITALS — BP 152/82 | HR 66 | Ht 69.5 in | Wt 241.8 lb

## 2023-07-01 DIAGNOSIS — R0789 Other chest pain: Secondary | ICD-10-CM | POA: Diagnosis not present

## 2023-07-01 DIAGNOSIS — I2510 Atherosclerotic heart disease of native coronary artery without angina pectoris: Secondary | ICD-10-CM | POA: Diagnosis not present

## 2023-07-01 DIAGNOSIS — I10 Essential (primary) hypertension: Secondary | ICD-10-CM | POA: Diagnosis not present

## 2023-07-01 DIAGNOSIS — E782 Mixed hyperlipidemia: Secondary | ICD-10-CM | POA: Diagnosis not present

## 2023-07-01 MED ORDER — AMLODIPINE BESYLATE 5 MG PO TABS
5.0000 mg | ORAL_TABLET | Freq: Every day | ORAL | 3 refills | Status: DC
Start: 2023-07-01 — End: 2023-07-10

## 2023-07-01 NOTE — Patient Instructions (Addendum)
 Medication Instructions:  Begin Amlodipine 5mg . Take one tablet daily.  *If you need a refill on your cardiac medications before your next appointment, please call your pharmacy*    Lab Work: Return tomorrow for lab work. If you have labs (blood work) drawn today and your tests are completely normal, you will receive your results only by: MyChart Message (if you have MyChart) OR A paper copy in the mail If you have any lab test that is abnormal or we need to change your treatment, we will call you to review the results.   Testing/Procedures: CARDIAC PET- Your physician has requested that you have a Cardiac Pet Stress Test.   This testing is completed at [facility] ([address] [place], [place] [address]) or [facility] ([address] [place], [place]). Please arrive 30 minutes prior to your scheduled time.  The schedulers will call you to get this scheduled. Please follow further testing instructions below.    Follow-Up: At [facility], you and your health needs are our priority.  As part of our continuing mission to provide you with exceptional heart care, we have created designated Provider Care Teams.  These Care Teams include your primary Cardiologist (physician) and Advanced Practice Providers (APPs -  Physician Assistants and Nurse Practitioners) who all work together to provide you with the care you need, when you need it.  We recommend signing up for the patient portal called "MyChart".  Sign up information is provided on this After Visit Summary.  MyChart is used to connect with patients for Virtual Visits (Telemedicine).  Patients are able to view lab/test results, encounter notes, upcoming appointments, etc.  Non-urgent messages can be sent to your provider as well.   To learn more about what you can do with MyChart, go to [email or web address].    Your next appointment:   Call the office for an appointment  after your procedure.   Provider:  Reather Littler, NP    Other Instructions        1st Floor: - Lobby - Registration  - Pharmacy  - Lab - Cafe   2nd Floor: - PV Lab - Diagnostic Testing (echo, CT, nuclear med)   3rd Floor: - Vacant   4th Floor: - TCTS (cardiothoracic surgery) - AFib Clinic - Structural Heart Clinic - Vascular Surgery  - Vascular Ultrasound   5th Floor: - HeartCare Cardiology (general and EP) - Clinical Pharmacy for coumadin, hypertension, lipid, weight-loss medications, and med management appointments      Valet parking services will be available as well.       Thank you for choosing [facility]!

## 2023-07-01 NOTE — Progress Notes (Signed)
 Cardiology Office Note    Date:  07/01/2023  ID:  [name], DOB 1955-09-07, MRN [id number] PCP:  Karie Schwalbe, MD  Cardiologist:  Nanetta Batty, MD  Electrophysiologist:  None   Chief Complaint: Hypertension and chest pain   History of Present Illness: .    [name] is a 68 y.o. male with visit-pertinent history of hypertension, GERD, hyperlipidemia, prediabetes, coronary artery disease s/p CABG in 2019.  Patient had stress test in 05/2022 that showed low risk and no evidence of ischemia.  In 2019 patient presented as a STEMI and underwent cardiac catheterization revealing true ostial LAD disease not favorable for PCI.  He underwent emergency CABG with LIMA to LAD and a vein to the diagonal branch.  In 05/2022 patient had nuclear stress test that showed low risk with no evidence of ischemia.  In 10/2022 he presented to the ED with chest pain that radiated to the left arm for 2 days prior.  He notes it would last for 1 to 2 minutes.  His workup was reassuring with negative troponins x 3.  Patient was last seen in clinic in 01/2023.  He remained stable for cardiac standpoint.  Today patient presents regarding hypertension and chest pain.  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  He and his wife report that he checked his blood pressure again last night and it remained slightly elevated with systolic in the 160s.  He has not been regularly checking at home, notes that his diet is very heavy in salt. Patient today also reports that last week he woke up and had a mid chest pressure that lasted the entire day.  When he woke up the next day it had improved.  He notes since then he has had intermittent chest pressure, he is unsure if associated with exertion.  Symptoms are overall vague in description although he notes it feels like his previous angina.  He notes some very slight shortness of breath with the chest pain.  He denies  palpitations, lower extremity edema, orthopnea or PND.  Patient continues to work as tolerated well. ROS: .   Today he denies lower extremity edema, fatigue, palpitations, melena, hematuria, hemoptysis, diaphoresis, weakness, presyncope, syncope, orthopnea, and PND.  All other systems are reviewed and otherwise negative. Studies Reviewed: Marland Kitchen    EKG:  EKG is ordered today, personally reviewed, demonstrating EKG Interpretation Date/Time:  Tuesday July 01 2023 15:50:39 EST Ventricular Rate:  66 PR Interval:  248 QRS Duration:  158 QT Interval:  444 QTC Calculation: 465 R Axis:   -54  Text Interpretation: Sinus rhythm with 1st degree A-V block Right bundle branch block Left anterior fascicular block Bifascicular block When compared with ECG of 28-Oct-2022 17:43, No significant change was found Confirmed by Reather Littler [phone]) on 07/01/2023 4:03:32 PM    CV Studies:  Cardiac Studies & Procedures   ______________________________________________________________________________________________ CARDIAC CATHETERIZATION  CARDIAC CATHETERIZATION 04/12/2018  Narrative  Dist LM to Ost LAD lesion is 90% stenosed.  Prox LAD to Mid LAD lesion is 95% stenosed.  There is mild left ventricular systolic dysfunction.  LV end diastolic pressure is normal.  The left ventricular ejection fraction is 50-55% by visual estimate.  1.  Severe single-vessel coronary artery disease involving the ostium and the midportion of the LAD which correlates with the patient's acute injury current on EKG 2.  Patent left mainstem, left circumflex, and RCA with mild nonobstructive stenosis  3.  Mild contraction abnormality of the LV apex with preserved overall LVEF estimated at 50 to 55%  Recommendations: With ostial LAD involvement, recommend emergency coronary bypass grafting.  Cardiac surgical consultation is placed.  Findings Coronary Findings Diagnostic  Dominance: Right  Left Main Dist LM to Ost LAD lesion is  90% stenosed. The lesion is eccentric.  Left Anterior Descending Prox LAD to Mid LAD lesion is 95% stenosed. The lesion is eccentric and thrombotic.  Left Circumflex Vessel is large. The vessel exhibits minimal luminal irregularities.  Right Coronary Artery There is mild diffuse disease throughout the vessel.  Intervention  No interventions have been documented.   STRESS TESTS  MYOCARDIAL PERFUSION IMAGING 05/30/2022  Narrative   The study is normal. The study is low risk.   No ST deviation was noted.   LV perfusion is normal. There is no evidence of ischemia. There is no evidence of infarction.   Left ventricular function is normal. Nuclear stress EF: 63 %. The left ventricular ejection fraction is normal (55-65%). End diastolic cavity size is normal. End systolic cavity size is normal.   Prior study available for comparison from 05/30/2020.  Normal resting and stress perfusion. No ischemia or infarction EF 63%   ECHOCARDIOGRAM  ECHOCARDIOGRAM COMPLETE 11/26/2022  Narrative ECHOCARDIOGRAM REPORT    Patient Name:   [name]   Date of Exam: 11/26/2022 Medical Rec #:  [id number]     Height:       70.0 in Accession #:    [id number]    Weight:       238.4 lb Date of Birth:  02-10-1956    BSA:          2.249 m Patient Age:    66 years      BP:           141/78 mmHg Patient Gender: M             HR:           64 bpm. Exam Location:  [address]  Procedure: 2D Echo, 3D Echo, Cardiac Doppler and Color Doppler  Indications:    R07.9 Chest Pain  History:        Patient has prior history of Echocardiogram examinations. CAD and STEMI, Prior CABG; Risk Factors:Hypertension and HLD.  Sonographer:    Clearence Ped RCS Referring Phys: [id number] JESSE M CLEAVER  IMPRESSIONS   1. Left ventricular ejection fraction, by estimation, is 60 to 65%. Left ventricular ejection fraction by 3D volume is 62 %. The left ventricle has normal function. The left ventricle has no regional wall  motion abnormalities. There is mild concentric left ventricular hypertrophy. Left ventricular diastolic parameters were normal. 2. Right ventricular systolic function is normal. The right ventricular size is mildly enlarged. There is normal pulmonary artery systolic pressure. The estimated right ventricular systolic pressure is 25.8 mmHg. 3. Right atrial size was mildly dilated. 4. The mitral valve is grossly normal. Trivial mitral valve regurgitation. No evidence of mitral stenosis. 5. The aortic valve is tricuspid. Aortic valve regurgitation is not visualized. No aortic stenosis is present. 6. The inferior vena cava is normal in size with greater than 50% respiratory variability, suggesting right atrial pressure of 3 mmHg.  FINDINGS Left Ventricle: Left ventricular ejection fraction, by estimation, is 60 to 65%. Left ventricular ejection fraction by 3D volume is 62 %. The left ventricle has normal function. The left ventricle has no regional wall motion abnormalities. The left ventricular internal cavity size  was normal in size. There is mild concentric left ventricular hypertrophy. Left ventricular diastolic parameters were normal.  Right Ventricle: The right ventricular size is mildly enlarged. No increase in right ventricular wall thickness. Right ventricular systolic function is normal. There is normal pulmonary artery systolic pressure. The tricuspid regurgitant velocity is 2.39 m/s, and with an assumed right atrial pressure of 3 mmHg, the estimated right ventricular systolic pressure is 25.8 mmHg.  Left Atrium: Left atrial size was normal in size.  Right Atrium: Right atrial size was mildly dilated.  Pericardium: There is no evidence of pericardial effusion.  Mitral Valve: The mitral valve is grossly normal. Trivial mitral valve regurgitation. No evidence of mitral valve stenosis.  Tricuspid Valve: The tricuspid valve is grossly normal. Tricuspid valve regurgitation is mild . No  evidence of tricuspid stenosis.  Aortic Valve: The aortic valve is tricuspid. Aortic valve regurgitation is not visualized. No aortic stenosis is present.  Pulmonic Valve: The pulmonic valve was grossly normal. Pulmonic valve regurgitation is trivial. No evidence of pulmonic stenosis.  Aorta: The aortic root and ascending aorta are structurally normal, with no evidence of dilitation.  Venous: The right lower pulmonary vein is normal. The inferior vena cava is normal in size with greater than 50% respiratory variability, suggesting right atrial pressure of 3 mmHg.  IAS/Shunts: The atrial septum is grossly normal.   LEFT VENTRICLE PLAX 2D LVIDd:         4.60 cm         Diastology LVIDs:         3.30 cm         LV e' medial:    7.94 cm/s LV PW:         1.20 cm         LV E/e' medial:  8.5 LV IVS:        1.30 cm         LV e' lateral:   7.72 cm/s LV E/e' lateral: 8.7  3D Volume EF LV 3D EF:    Left ventricul ar ejection fraction by 3D volume is 62 %.  3D Volume EF: 3D EF:        62 % LV EDV:       119 ml LV ESV:       46 ml LV SV:        74 ml  RIGHT VENTRICLE RV Basal diam:  4.20 cm RV Mid diam:    3.70 cm RV S prime:     8.38 cm/s TAPSE (M-mode): 1.8 cm RVSP:           25.8 mmHg  LEFT ATRIUM             Index        RIGHT ATRIUM           Index LA diam:        3.80 cm 1.69 cm/m   RA Pressure: 3.00 mmHg LA Vol (A2C):   66.5 ml 29.57 ml/m  RA Area:     20.90 cm LA Vol (A4C):   67.4 ml 29.97 ml/m  RA Volume:   66.10 ml  29.39 ml/m LA Biplane Vol: 70.1 ml 31.17 ml/m AORTIC VALVE LVOT Vmax:   73.40 cm/s LVOT Vmean:  55.100 cm/s LVOT VTI:    0.201 m  AORTA Ao Root diam: 3.40 cm Ao Asc diam:  3.50 cm  MITRAL VALVE  TRICUSPID VALVE MV Area (PHT):             TR Peak grad:   22.8 mmHg MV Decel Time:             TR Vmax:        239.00 cm/s MV E velocity: 67.10 cm/s  Estimated RAP:  3.00 mmHg MV A velocity: 66.60 cm/s  RVSP:           25.8 mmHg MV  E/A ratio:  1.01 SHUNTS Systemic VTI: 0.20 m  Lennie Odor MD Electronically signed by Lennie Odor MD Signature Date/Time: 11/26/2022/8:06:38 PM    Final   TEE  ECHO TEE 04/12/2018  Interpretation Summary  Aortic valve: The valve is trileaflet. No stenosis. No regurgitation.  Mitral valve: Trace regurgitation.  Right ventricle: Normal cavity size, wall thickness and ejection fraction.  Tricuspid valve: Trace regurgitation.  Pulmonic valve: Trace regurgitation.        ______________________________________________________________________________________________      Current Reported Medications:.    Current Meds  Medication Sig   amLODipine (NORVASC) 5 MG tablet Take 1 tablet (5 mg total) by mouth daily.   aspirin EC 81 MG tablet Take 81 mg by mouth daily. Swallow whole.   Bempedoic Acid-Ezetimibe (NEXLIZET) 180-10 MG TABS Take 1 tablet by mouth daily.   fluticasone (FLONASE) 50 MCG/ACT nasal spray SPRAY 2 SPRAYS INTO EACH NOSTRIL EVERY DAY   HYDROcodone bit-homatropine (HYCODAN) 5-1.5 MG/5ML syrup Take 5 mLs by mouth at bedtime as needed for cough.   loratadine (CLARITIN) 10 MG tablet Take 10 mg by mouth daily.   metoprolol tartrate (LOPRESSOR) 25 MG tablet TAKE 1/2 TABLET TWICE A DAY BY MOUTH   Multiple Vitamin (MULTIVITAMIN) tablet Take 1 tablet by mouth daily.   Omega-3 Fatty Acids (FISH OIL) 1000 MG CAPS Take by mouth.   pantoprazole (PROTONIX) 40 MG tablet TAKE 1 TABLET BY MOUTH EVERY DAY   traZODone (DESYREL) 50 MG tablet Take 1-2 tablets (50-100 mg total) by mouth at bedtime.   Physical Exam:   VS:  BP (!) 152/82   Pulse 66   Ht 5' 9.5" (1.765 m)   Wt 241 lb 12.8 oz (109.7 kg)   SpO2 97%   BMI 35.20 kg/m    Wt Readings from Last 3 Encounters:  07/01/23 241 lb 12.8 oz (109.7 kg)  03/18/23 240 lb (108.9 kg)  02/12/23 240 lb (108.9 kg)    GEN: Well nourished, well developed in no acute distress NECK: No JVD; No carotid bruits CARDIAC: RRR, no  murmurs, rubs, gallops RESPIRATORY:  Clear to auscultation without rales, wheezing or rhonchi  ABDOMEN: Soft, non-tender, non-distended EXTREMITIES:  No edema; No acute deformity  Asessement and Plan:.    CAD/atypical chest pain: Patient with STEMI in 03/2018.  He underwent cardiac catheterization revealing a true ostial LAD lesion not amenable for percutaneous intervention.  He underwent CABG by Dr. Cornelius Moras with LIMA to his LAD and a vein to the diagonal branch. In 05/2022 patient had nuclear stress test that showed low risk with no evidence of ischemia.  Today patient reports waking with chest pressure last week that lasted all day, he has had intermittent chest discomfort and pressure since, he is unsure if specifically associated with exertion.  Notes he can feel it slightly when he is walking, description overall vague.  Notes some slight shortness of breath.  Reviewed with Dr. Allyson Sabal, patient's primary cardiologist who agreed with plan for nuclear stress test with [facility], patient notes left  leg pain not an ideal treadmill candidate.  Patient is agreeable to stress testing, please see consent below.  Reviewed ED precautions.  Continue aspirin.  Given elevation of blood pressure will start on amlodipine 5 mg daily, see below.  Check CBC, BMET and TSH.  Informed Consent   Shared Decision Making/Informed Consent The risks [chest pain, shortness of breath, cardiac arrhythmias, dizziness, blood pressure fluctuations, myocardial infarction, stroke/transient ischemic attack, nausea, vomiting, allergic reaction, radiation exposure, metallic taste sensation and life-threatening complications (estimated to be 1 in 10,000)], benefits (risk stratification, diagnosing coronary artery disease, treatment guidance) and alternatives of a nuclear stress test were discussed in detail with [name] and he agrees to proceed.     Hypertension: Blood pressure today 158/84, on recheck was 152/82.  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  Will start amlodipine 5 mg daily.  Reviewed with patient need for low-sodium diet.  Encouraged patient to monitor his blood pressure at home, record and bring to follow-up.  Continue metoprolol tartrate.  Hyperlipidemia: Last lipid profile on 03/18/2023 indicated total cholesterol 137, HDL 39.5, triglycerides 104 and LDL 76.  Patient intolerant of statins and Repatha. Continue Nexlizet.     Disposition: F/u with Reather Littler, NP following stress test.   Signed, Rip Harbour, NP

## 2023-07-01 NOTE — Telephone Encounter (Signed)
 Patient identification verified by 2 forms. 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

## 2023-07-01 NOTE — Telephone Encounter (Signed)
 Attempted to call patient, no answer left message requesting a call back.

## 2023-07-01 NOTE — Telephone Encounter (Signed)
 Wife ([name]) returned RN's call.

## 2023-07-01 NOTE — Telephone Encounter (Signed)
 Pt c/o BP issue:  1. What are your last 5 BP readings? 188/95, 187/93  2. Are you having any other symptoms (ex. Dizziness, headache, blurred vision, passed out)?  Legs been hurting and chest was hurting the other day  3. What is your medication issue? Blood pressure is running high- primary doctor wanted patient to be seen by somebody here

## 2023-07-03 LAB — BASIC METABOLIC PANEL
BUN/Creatinine Ratio: 16 (ref 10–24)
BUN: 16 mg/dL (ref 8–27)
CO2: 20 mmol/L (ref 20–29)
Calcium: 9.6 mg/dL (ref 8.6–10.2)
Chloride: 105 mmol/L (ref 96–106)
Creatinine, Ser: 0.99 mg/dL (ref 0.76–1.27)
Glucose: 96 mg/dL (ref 70–99)
Potassium: 4.1 mmol/L (ref 3.5–5.2)
Sodium: 144 mmol/L (ref 134–144)
eGFR: 83 mL/min/{1.73_m2} (ref >59)

## 2023-07-03 LAB — CBC
Hematocrit: 44.9 % (ref 37.5–51.0)
Hemoglobin: 15.2 g/dL (ref 13.0–17.7)
MCH: 30.4 pg (ref 26.6–33.0)
MCHC: 33.9 g/dL (ref 31.5–35.7)
MCV: 90 fL (ref 79–97)
Platelets: 291 10*3/uL (ref 150–450)
RBC: 5 x10E6/uL (ref 4.14–5.80)
RDW: 12.9 % (ref 11.6–15.4)
WBC: 8.8 10*3/uL (ref 3.4–10.8)

## 2023-07-03 LAB — TSH: TSH: 1.41 u[IU]/mL (ref 0.450–4.500)

## 2023-07-03 NOTE — Telephone Encounter (Signed)
 Spoke to patient . He is aware that  appointment  has been moved  07/10/23.  Per Reather Littler NP.

## 2023-07-04 ENCOUNTER — Telehealth (HOSPITAL_COMMUNITY): Payer: Self-pay | Admitting: *Deleted

## 2023-07-04 ENCOUNTER — Telehealth: Payer: Self-pay

## 2023-07-04 NOTE — Telephone Encounter (Signed)
-----   Message from Rip Harbour sent at 07/04/2023  2:03 PM EST ----- Please let [name] know that his CBC shows no evidence of anemia or infection. His kidney function is normal and his electrolytes are normal. His thyroid function is normal. Continue current medications and proceed with testing as planned.

## 2023-07-04 NOTE — Telephone Encounter (Signed)
 Patient given detailed instructions per Myocardial Perfusion Study Information Sheet for the test on 07/07/2023 at 7:15. Patient notified to arrive 15 minutes early and that it is imperative to arrive on time for appointment to keep from having the test rescheduled.  If you need to cancel or reschedule your appointment, please call the office within 24 hours of your appointment. . Patient verbalized understanding.[name]

## 2023-07-04 NOTE — Telephone Encounter (Signed)
 Left message to call back

## 2023-07-07 ENCOUNTER — Ambulatory Visit (HOSPITAL_COMMUNITY): Attending: Cardiology

## 2023-07-07 DIAGNOSIS — I10 Essential (primary) hypertension: Secondary | ICD-10-CM | POA: Diagnosis present

## 2023-07-07 DIAGNOSIS — R0789 Other chest pain: Secondary | ICD-10-CM | POA: Diagnosis present

## 2023-07-07 DIAGNOSIS — I2510 Atherosclerotic heart disease of native coronary artery without angina pectoris: Secondary | ICD-10-CM | POA: Insufficient documentation

## 2023-07-07 LAB — MYOCARDIAL PERFUSION IMAGING
LV dias vol: 104 mL (ref 62–150)
LV sys vol: 41 mL
Nuc Stress EF: 61 %
Peak HR: 82 {beats}/min
Rest HR: 60 {beats}/min
Rest Nuclear Isotope Dose: 10.8 mCi
SDS: 2
SRS: 0
SSS: 2
ST Depression (mm): 0 mm
Stress Nuclear Isotope Dose: 32.2 mCi
TID: 1.03

## 2023-07-07 MED ORDER — TECHNETIUM TC 99M TETROFOSMIN IV KIT
32.2000 | PACK | Freq: Once | INTRAVENOUS | Status: AC | PRN
  Administered 2023-07-07: 32.2 via INTRAVENOUS

## 2023-07-07 MED ORDER — REGADENOSON 0.4 MG/5ML IV SOLN
0.4000 mg | Freq: Once | INTRAVENOUS | Status: AC
Start: 2023-07-07 — End: 2023-07-07
  Administered 2023-07-07: 0.4 mg via INTRAVENOUS

## 2023-07-07 MED ORDER — TECHNETIUM TC 99M TETROFOSMIN IV KIT
10.8000 | PACK | Freq: Once | INTRAVENOUS | Status: AC | PRN
  Administered 2023-07-07: 10.8 via INTRAVENOUS

## 2023-07-07 NOTE — Telephone Encounter (Signed)
 Called patient advised of below they verbalized understanding.

## 2023-07-09 NOTE — Progress Notes (Unsigned)
 Cardiology Office Note    Date:  07/10/2023  ID:  [name], DOB [birth year], MRN [id number] PCP:  Karie Schwalbe, MD  Cardiologist:  Nanetta Batty, MD  Electrophysiologist:  None   Chief Complaint: Follow up for hypertension   History of Present Illness: .    [name] is a 68 y.o. male with visit-pertinent history of hypertension, GERD, hyperlipidemia, prediabetes, coronary artery disease s/p CABG in 2019.  Patient had stress test in 05/2022 that showed low risk and no evidence of ischemia.  In 2019 patient presented as a STEMI and underwent cardiac catheterization revealing true ostial LAD disease not favorable for PCI.  He underwent emergency CABG with LIMA to LAD and a vein to the diagonal branch.  In 05/2022 patient had nuclear stress test that showed low risk with no evidence of ischemia.  In 10/2022 he presented to the ED with chest pain that radiated to the left arm for 2 days prior.  He notes it would last for 1 to 2 minutes.  His workup was reassuring with negative troponins x 3.  Patient was last seen in clinic in 01/2023.  He remained stable for cardiac standpoint.  On 3//25 patient presented regarding hypertension and chest pain.  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  He was not regularly checking it at home but did note that his diet was very high in salt.  Patient reported that the week prior he had woken up and had a mid chest pressure that lasted the entire day, had improved the next day.  However he noted intermittent chest discomfort.  It was recommended that he start on amlodipine 5 mg daily and undergo nuclear stress test. Nuclear stress test on 07/07/2023 was normal and low risk, no changes compared to prior study.  Today he presents for follow-up.  He reports that he has been doing well.  He reports some improvement in blood pressure since starting on amlodipine although notes this is still  high, although he has been checking his blood pressure prior to taking his medications.  He unfortunately does not have a blood pressure log for my review today.  He notes his chest pain has resolved.  Reviewed nuclear stress test results with patient and his wife, all questions were answered.  He denies shortness of breath, lower extremity edema, orthopnea or PND.  He notes after starting on amlodipine he did have some slight nausea and dizziness that resolved after a day, denies any recurrence.  Labwork independently reviewed: 07/02/2023: Sodium 144, potassium 4.1, creatinine 0.99, hemoglobin 15.2, hematocrit 44.9 ROS: .   Today he denies chest pain, shortness of breath, lower extremity edema, fatigue, palpitations, melena, hematuria, hemoptysis, diaphoresis, weakness, presyncope, syncope, orthopnea, and PND.  All other systems are reviewed and otherwise negative. Studies Reviewed: Marland Kitchen    EKG:  EKG is not ordered today.  CV Studies: Cardiac studies reviewed are outlined and summarized above. Otherwise please see EMR for full report. Cardiac Studies & Procedures   ______________________________________________________________________________________________ CARDIAC CATHETERIZATION  CARDIAC CATHETERIZATION 04/12/2018  Narrative  Dist LM to Ost LAD lesion is 90% stenosed.  Prox LAD to Mid LAD lesion is 95% stenosed.  There is mild left ventricular systolic dysfunction.  LV end diastolic pressure is normal.  The left ventricular ejection fraction is 50-55% by visual estimate.  1.  Severe single-vessel coronary artery disease involving the ostium and the midportion of the  LAD which correlates with the patient's acute injury current on EKG 2.  Patent left mainstem, left circumflex, and RCA with mild nonobstructive stenosis 3.  Mild contraction abnormality of the LV apex with preserved overall LVEF estimated at 50 to 55%  Recommendations: With ostial LAD involvement, recommend emergency  coronary bypass grafting.  Cardiac surgical consultation is placed.  Findings Coronary Findings Diagnostic  Dominance: Right  Left Main Dist LM to Ost LAD lesion is 90% stenosed. The lesion is eccentric.  Left Anterior Descending Prox LAD to Mid LAD lesion is 95% stenosed. The lesion is eccentric and thrombotic.  Left Circumflex Vessel is large. The vessel exhibits minimal luminal irregularities.  Right Coronary Artery There is mild diffuse disease throughout the vessel.  Intervention  No interventions have been documented.   STRESS TESTS  MYOCARDIAL PERFUSION IMAGING 07/07/2023  Narrative   The study is normal. The study is low risk.   No ST deviation was noted.   LV perfusion is normal.   Left ventricular function is normal. Nuclear stress EF: 61%. The left ventricular ejection fraction is normal (55-65%). End diastolic cavity size is normal.   Prior study available for comparison from 05/30/2022. No changes compared to prior study.   Electronically signed by Thurmon Fair, MD  Low risk stress nuclear study with normal perfusion and normal left ventricular regional and global systolic function.   ECHOCARDIOGRAM  ECHOCARDIOGRAM COMPLETE 11/26/2022  Narrative ECHOCARDIOGRAM REPORT    Patient Name:   [name]   Date of Exam: 11/26/2022 Medical Rec #:  [id number]     Height:       70.0 in Accession #:    [id number]    Weight:       238.4 lb Date of Birth:  01/12/1956    BSA:          2.249 m Patient Age:    66 years      BP:           141/78 mmHg Patient Gender: M             HR:           64 bpm. Exam Location:  [address]  Procedure: 2D Echo, 3D Echo, Cardiac Doppler and Color Doppler  Indications:    R07.9 Chest Pain  History:        Patient has prior history of Echocardiogram examinations. CAD and STEMI, Prior CABG; Risk Factors:Hypertension and HLD.  Sonographer:    Clearence Ped RCS Referring Phys: [id number] JESSE M CLEAVER  IMPRESSIONS   1. Left  ventricular ejection fraction, by estimation, is 60 to 65%. Left ventricular ejection fraction by 3D volume is 62 %. The left ventricle has normal function. The left ventricle has no regional wall motion abnormalities. There is mild concentric left ventricular hypertrophy. Left ventricular diastolic parameters were normal. 2. Right ventricular systolic function is normal. The right ventricular size is mildly enlarged. There is normal pulmonary artery systolic pressure. The estimated right ventricular systolic pressure is 25.8 mmHg. 3. Right atrial size was mildly dilated. 4. The mitral valve is grossly normal. Trivial mitral valve regurgitation. No evidence of mitral stenosis. 5. The aortic valve is tricuspid. Aortic valve regurgitation is not visualized. No aortic stenosis is present. 6. The inferior vena cava is normal in size with greater than 50% respiratory variability, suggesting right atrial pressure of 3 mmHg.  FINDINGS Left Ventricle: Left ventricular ejection fraction, by estimation, is 60 to 65%. Left ventricular ejection fraction by 3D  volume is 62 %. The left ventricle has normal function. The left ventricle has no regional wall motion abnormalities. The left ventricular internal cavity size was normal in size. There is mild concentric left ventricular hypertrophy. Left ventricular diastolic parameters were normal.  Right Ventricle: The right ventricular size is mildly enlarged. No increase in right ventricular wall thickness. Right ventricular systolic function is normal. There is normal pulmonary artery systolic pressure. The tricuspid regurgitant velocity is 2.39 m/s, and with an assumed right atrial pressure of 3 mmHg, the estimated right ventricular systolic pressure is 25.8 mmHg.  Left Atrium: Left atrial size was normal in size.  Right Atrium: Right atrial size was mildly dilated.  Pericardium: There is no evidence of pericardial effusion.  Mitral Valve: The mitral valve is  grossly normal. Trivial mitral valve regurgitation. No evidence of mitral valve stenosis.  Tricuspid Valve: The tricuspid valve is grossly normal. Tricuspid valve regurgitation is mild . No evidence of tricuspid stenosis.  Aortic Valve: The aortic valve is tricuspid. Aortic valve regurgitation is not visualized. No aortic stenosis is present.  Pulmonic Valve: The pulmonic valve was grossly normal. Pulmonic valve regurgitation is trivial. No evidence of pulmonic stenosis.  Aorta: The aortic root and ascending aorta are structurally normal, with no evidence of dilitation.  Venous: The right lower pulmonary vein is normal. The inferior vena cava is normal in size with greater than 50% respiratory variability, suggesting right atrial pressure of 3 mmHg.  IAS/Shunts: The atrial septum is grossly normal.   LEFT VENTRICLE PLAX 2D LVIDd:         4.60 cm         Diastology LVIDs:         3.30 cm         LV e' medial:    7.94 cm/s LV PW:         1.20 cm         LV E/e' medial:  8.5 LV IVS:        1.30 cm         LV e' lateral:   7.72 cm/s LV E/e' lateral: 8.7  3D Volume EF LV 3D EF:    Left ventricul ar ejection fraction by 3D volume is 62 %.  3D Volume EF: 3D EF:        62 % LV EDV:       119 ml LV ESV:       46 ml LV SV:        74 ml  RIGHT VENTRICLE RV Basal diam:  4.20 cm RV Mid diam:    3.70 cm RV S prime:     8.38 cm/s TAPSE (M-mode): 1.8 cm RVSP:           25.8 mmHg  LEFT ATRIUM             Index        RIGHT ATRIUM           Index LA diam:        3.80 cm 1.69 cm/m   RA Pressure: 3.00 mmHg LA Vol (A2C):   66.5 ml 29.57 ml/m  RA Area:     20.90 cm LA Vol (A4C):   67.4 ml 29.97 ml/m  RA Volume:   66.10 ml  29.39 ml/m LA Biplane Vol: 70.1 ml 31.17 ml/m AORTIC VALVE LVOT Vmax:   73.40 cm/s LVOT Vmean:  55.100 cm/s LVOT VTI:    0.201 m  AORTA Ao Root diam:  3.40 cm Ao Asc diam:  3.50 cm  MITRAL VALVE               TRICUSPID VALVE MV Area (PHT):             TR  Peak grad:   22.8 mmHg MV Decel Time:             TR Vmax:        239.00 cm/s MV E velocity: 67.10 cm/s  Estimated RAP:  3.00 mmHg MV A velocity: 66.60 cm/s  RVSP:           25.8 mmHg MV E/A ratio:  1.01 SHUNTS Systemic VTI: 0.20 m  Lennie Odor MD Electronically signed by Lennie Odor MD Signature Date/Time: 11/26/2022/8:06:38 PM    Final   TEE  ECHO TEE 04/12/2018  Interpretation Summary  Aortic valve: The valve is trileaflet. No stenosis. No regurgitation.  Mitral valve: Trace regurgitation.  Right ventricle: Normal cavity size, wall thickness and ejection fraction.  Tricuspid valve: Trace regurgitation.  Pulmonic valve: Trace regurgitation.        ______________________________________________________________________________________________       Current Reported Medications:.    Current Meds  Medication Sig   amLODipine (NORVASC) 10 MG tablet Take 1 tablet (10 mg total) by mouth daily.   aspirin EC 81 MG tablet Take 81 mg by mouth daily. Swallow whole.   fluticasone (FLONASE) 50 MCG/ACT nasal spray SPRAY 2 SPRAYS INTO EACH NOSTRIL EVERY DAY   loratadine (CLARITIN) 10 MG tablet Take 10 mg by mouth daily.   metoprolol tartrate (LOPRESSOR) 25 MG tablet TAKE 1/2 TABLET TWICE A DAY BY MOUTH   Multiple Vitamin (MULTIVITAMIN) tablet Take 1 tablet by mouth daily.   Omega-3 Fatty Acids (FISH OIL) 1000 MG CAPS Take by mouth.   pantoprazole (PROTONIX) 40 MG tablet TAKE 1 TABLET BY MOUTH EVERY DAY   traZODone (DESYREL) 50 MG tablet Take 1-2 tablets (50-100 mg total) by mouth at bedtime. (Patient taking differently: Take 50-100 mg by mouth at bedtime as needed for sleep.)   [DISCONTINUED] amLODipine (NORVASC) 5 MG tablet Take 1 tablet (5 mg total) by mouth daily.   [DISCONTINUED] Bempedoic Acid-Ezetimibe (NEXLIZET) 180-10 MG TABS Take 1 tablet by mouth daily.   Physical Exam:    VS:  BP (!) 140/76   Pulse 62   Ht 5' 9.5" (1.765 m)   Wt 238 lb 9.6 oz (108.2 kg)    SpO2 95%   BMI 34.73 kg/m    Wt Readings from Last 3 Encounters:  07/10/23 238 lb 9.6 oz (108.2 kg)  07/07/23 241 lb (109.3 kg)  07/01/23 241 lb 12.8 oz (109.7 kg)    GEN: Well nourished, well developed in no acute distress NECK: No JVD; No carotid bruits CARDIAC: RRR, no murmurs, rubs, gallops RESPIRATORY:  Clear to auscultation without rales, wheezing or rhonchi  ABDOMEN: Soft, non-tender, non-distended EXTREMITIES:  No edema; No acute deformity     Asessement and Plan:.    CAD/atypical chest pain: Patient with STEMI in 03/2018.  He underwent cardiac catheterization revealing a true ostial LAD lesion not amenable for percutaneous intervention.  He underwent CABG by Dr. Cornelius Moras with LIMA to his LAD and a vein to the diagonal branch.  In 05/2022 patient had nuclear stress test that showed low risk with no evidence of ischemia.  At office visit on 3/4 patient reported similar chest pressure, he was unsure if it was associated with exertion.  Patient underwent nuclear stress test on  07/07/2023 that was normal and low risk.  No significant change compared to prior stress test. Today patient reports that he is doing well, chest pain is resolved.  He denies shortness of breath.  Will increase amlodipine to 10 mg daily as noted above.  Continue aspirin.  Hypertension: Blood pressure today 142/62, on recheck was 140/76.  Patient notes some improvement at home, unfortunately does not have a blood pressure log today for review.  Patient does note it has remained elevated although he has been taking his blood pressure before taking his morning medications.  Encouraged patient to continue monitoring at home, ideally 2 hours after taking his blood pressure medication and to record on blood pressure log provided.  Salty 6 sheet provided.  Patient did note some slight dizziness and nausea the first day he took amlodipine, quickly resolved.  Encouraged to monitor and notify the office if unable to tolerate.  Will  increase amlodipine to 10 mg daily.  Hyperlipidemia: Last lipid profile in 03/18/2023 indicated total cholesterol 137, HDL 39.5, triglycerides 104 and LDL 76.  Patient with history of intolerance to statins and Repatha.  Continue Nexlizet, refill provided.     Disposition: F/u with Reather Littler, NP in 4-6 weeks.   Signed, Rip Harbour, NP

## 2023-07-10 ENCOUNTER — Encounter: Payer: Self-pay | Admitting: Cardiology

## 2023-07-10 ENCOUNTER — Ambulatory Visit: Attending: Cardiology | Admitting: Cardiology

## 2023-07-10 VITALS — BP 140/76 | HR 62 | Ht 69.5 in | Wt 238.6 lb

## 2023-07-10 DIAGNOSIS — I2510 Atherosclerotic heart disease of native coronary artery without angina pectoris: Secondary | ICD-10-CM

## 2023-07-10 DIAGNOSIS — R0789 Other chest pain: Secondary | ICD-10-CM

## 2023-07-10 DIAGNOSIS — E782 Mixed hyperlipidemia: Secondary | ICD-10-CM

## 2023-07-10 DIAGNOSIS — I10 Essential (primary) hypertension: Secondary | ICD-10-CM | POA: Diagnosis not present

## 2023-07-10 DIAGNOSIS — I4510 Unspecified right bundle-branch block: Secondary | ICD-10-CM

## 2023-07-10 MED ORDER — NEXLIZET 180-10 MG PO TABS: 1.0000 | ORAL_TABLET | Freq: Every day | ORAL | 3 refills | Status: DC

## 2023-07-10 MED ORDER — AMLODIPINE BESYLATE 10 MG PO TABS: 10.0000 mg | ORAL_TABLET | Freq: Every day | ORAL | 3 refills | Status: AC

## 2023-07-10 NOTE — Patient Instructions (Addendum)
 Medication Instructions:  Increase Amlodipine to 10 mg once a day  *If you need a refill on your cardiac medications before your next appointment, please call your pharmacy*  Lab Work: No labs If you have labs (blood work) drawn today and your tests are completely normal, you will receive your results only by: MyChart Message (if you have MyChart) OR A paper copy in the mail If you have any lab test that is abnormal or we need to change your treatment, we will call you to review the results.  Testing/Procedures: No testing  Follow-Up: At [facility], you and your health needs are our priority.  As part of our continuing mission to provide you with exceptional heart care, we have created designated Provider Care Teams.  These Care Teams include your primary Cardiologist (physician) and Advanced Practice Providers (APPs -  Physician Assistants and Nurse Practitioners) who all work together to provide you with the care you need, when you need it.  We recommend signing up for the patient portal called "MyChart".  Sign up information is provided on this After Visit Summary.  MyChart is used to connect with patients for Virtual Visits (Telemedicine).  Patients are able to view lab/test results, encounter notes, upcoming appointments, etc.  Non-urgent messages can be sent to your provider as well.   To learn more about what you can do with MyChart, go to [email or web address].    Your next appointment:   4-6 week(s)  Provider:   Reather Littler, NP  Other Instructions Please take your blood pressure daily for 2 weeks and send in a MyChart message. Please include heart rates. (One message at the end of the 2 weeks).   HOW TO TAKE YOUR BLOOD PRESSURE: Rest 5 minutes before taking your blood pressure. Don't smoke or drink caffeinated beverages for at least 30 minutes before. Take your blood pressure before (not after) you eat. Sit comfortably with your back supported and both feet on  the floor (don't cross your legs). Elevate your arm to heart level on a table or a desk. Use the proper sized cuff. It should fit smoothly and snugly around your bare upper arm. There should be enough room to slip a fingertip under the cuff. The bottom edge of the cuff should be 1 inch above the crease of the elbow. Ideally, take 3 measurements at one sitting and record the average.

## 2023-07-28 ENCOUNTER — Ambulatory Visit: Admitting: Cardiology

## 2023-08-03 NOTE — Progress Notes (Unsigned)
 Cardiology Office Note    Date:  08/05/2023  ID:  [name], DOB 11-29-1955, MRN [id number] PCP:  Karie Schwalbe, MD  Cardiologist:  Nanetta Batty, MD  Electrophysiologist:  None   Chief Complaint: Follow up for hypertension   History of Present Illness: .    [name] is a 68 y.o. male with visit-pertinent history of hypertension, GERD, hyperlipidemia, prediabetes, coronary artery disease s/p CABG in 2019.  Patient had stress test in 05/2022 that showed low risk and no evidence of ischemia.  In 2019 patient presented as a STEMI and underwent cardiac catheterization revealing true ostial LAD disease not favorable for PCI.  He underwent emergency CABG with LIMA to LAD and a vein to the diagonal branch.  In 05/2022 patient had nuclear stress test that showed low risk with no evidence of ischemia.  In 10/2022 he presented to the ED with chest pain that radiated to the left arm for 2 days prior.  He notes it would last for 1 to 2 minutes.  His workup was reassuring with negative troponins x 3.  Patient was last seen in clinic in 01/2023.  He remained stable for cardiac standpoint.  On 07/01/23 patient presented regarding hypertension and chest pain.  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  He was not regularly checking it at home but did note that his diet was very high in salt.  Patient reported that the week prior he had woken up and had a mid chest pressure that lasted the entire day, had improved the next day.  However he noted intermittent chest discomfort.  It was recommended that he start on amlodipine 5 mg daily and undergo nuclear stress test. Nuclear stress test on 07/07/2023 was normal and low risk, no changes compared to prior study.  Patient was last seen in clinic on 07/10/2023.  Patient reported that he was doing very well overall.  His blood pressure did remain slightly elevated and his amlodipine was increased  to 10 mg daily.  Today he presents for follow-up.  He reports that he has been doing very well overall.  He denies any further chest pain, shortness of breath, lower extremity edema, orthopnea or PND.  He denies any palpitations, presyncope or syncope.  He reports that he has reduced his intake of sugar and breads.  Patient has noted some weight loss at home.  He also reports that his blood pressures have overall been better controlled, systolic blood pressures ranging from 130-140, patient's home blood pressure cuff runs 10 mmHg higher than a manual cuff.  ROS: .   Today he denies chest pain, shortness of breath, lower extremity edema, fatigue, palpitations, melena, hematuria, hemoptysis, diaphoresis, weakness, presyncope, syncope, orthopnea, and PND.  All other systems are reviewed and otherwise negative. Studies Reviewed: Marland Kitchen   EKG:  EKG is not ordered today.  CV Studies: Cardiac studies reviewed are outlined and summarized above. Otherwise please see EMR for full report. Cardiac Studies & Procedures   ______________________________________________________________________________________________ CARDIAC CATHETERIZATION  CARDIAC CATHETERIZATION 04/12/2018  Narrative  Dist LM to Ost LAD lesion is 90% stenosed.  Prox LAD to Mid LAD lesion is 95% stenosed.  There is mild left ventricular systolic dysfunction.  LV end diastolic pressure is normal.  The left ventricular ejection fraction is 50-55% by visual estimate.  1.  Severe single-vessel coronary artery disease involving the ostium and the midportion of the LAD which  correlates with the patient's acute injury current on EKG 2.  Patent left mainstem, left circumflex, and RCA with mild nonobstructive stenosis 3.  Mild contraction abnormality of the LV apex with preserved overall LVEF estimated at 50 to 55%  Recommendations: With ostial LAD involvement, recommend emergency coronary bypass grafting.  Cardiac surgical consultation is  placed.  Findings Coronary Findings Diagnostic  Dominance: Right  Left Main Dist LM to Ost LAD lesion is 90% stenosed. The lesion is eccentric.  Left Anterior Descending Prox LAD to Mid LAD lesion is 95% stenosed. The lesion is eccentric and thrombotic.  Left Circumflex Vessel is large. The vessel exhibits minimal luminal irregularities.  Right Coronary Artery There is mild diffuse disease throughout the vessel.  Intervention  No interventions have been documented.   STRESS TESTS  MYOCARDIAL PERFUSION IMAGING 07/07/2023  Narrative   The study is normal. The study is low risk.   No ST deviation was noted.   LV perfusion is normal.   Left ventricular function is normal. Nuclear stress EF: 61%. The left ventricular ejection fraction is normal (55-65%). End diastolic cavity size is normal.   Prior study available for comparison from 05/30/2022. No changes compared to prior study.   Electronically signed by Thurmon Fair, MD  Low risk stress nuclear study with normal perfusion and normal left ventricular regional and global systolic function.   ECHOCARDIOGRAM  ECHOCARDIOGRAM COMPLETE 11/26/2022  Narrative ECHOCARDIOGRAM REPORT    Patient Name:   [name]   Date of Exam: 11/26/2022 Medical Rec #:  [id number]     Height:       70.0 in Accession #:    [id number]    Weight:       238.4 lb Date of Birth:  09/06/1955    BSA:          2.249 m Patient Age:    66 years      BP:           141/78 mmHg Patient Gender: M             HR:           64 bpm. Exam Location:  [address]  Procedure: 2D Echo, 3D Echo, Cardiac Doppler and Color Doppler  Indications:    R07.9 Chest Pain  History:        Patient has prior history of Echocardiogram examinations. CAD and STEMI, Prior CABG; Risk Factors:Hypertension and HLD.  Sonographer:    Clearence Ped RCS Referring Phys: [id number] JESSE M CLEAVER  IMPRESSIONS   1. Left ventricular ejection fraction, by estimation, is 60 to 65%.  Left ventricular ejection fraction by 3D volume is 62 %. The left ventricle has normal function. The left ventricle has no regional wall motion abnormalities. There is mild concentric left ventricular hypertrophy. Left ventricular diastolic parameters were normal. 2. Right ventricular systolic function is normal. The right ventricular size is mildly enlarged. There is normal pulmonary artery systolic pressure. The estimated right ventricular systolic pressure is 25.8 mmHg. 3. Right atrial size was mildly dilated. 4. The mitral valve is grossly normal. Trivial mitral valve regurgitation. No evidence of mitral stenosis. 5. The aortic valve is tricuspid. Aortic valve regurgitation is not visualized. No aortic stenosis is present. 6. The inferior vena cava is normal in size with greater than 50% respiratory variability, suggesting right atrial pressure of 3 mmHg.  FINDINGS Left Ventricle: Left ventricular ejection fraction, by estimation, is 60 to 65%. Left ventricular ejection fraction by 3D volume is  62 %. The left ventricle has normal function. The left ventricle has no regional wall motion abnormalities. The left ventricular internal cavity size was normal in size. There is mild concentric left ventricular hypertrophy. Left ventricular diastolic parameters were normal.  Right Ventricle: The right ventricular size is mildly enlarged. No increase in right ventricular wall thickness. Right ventricular systolic function is normal. There is normal pulmonary artery systolic pressure. The tricuspid regurgitant velocity is 2.39 m/s, and with an assumed right atrial pressure of 3 mmHg, the estimated right ventricular systolic pressure is 25.8 mmHg.  Left Atrium: Left atrial size was normal in size.  Right Atrium: Right atrial size was mildly dilated.  Pericardium: There is no evidence of pericardial effusion.  Mitral Valve: The mitral valve is grossly normal. Trivial mitral valve regurgitation. No  evidence of mitral valve stenosis.  Tricuspid Valve: The tricuspid valve is grossly normal. Tricuspid valve regurgitation is mild . No evidence of tricuspid stenosis.  Aortic Valve: The aortic valve is tricuspid. Aortic valve regurgitation is not visualized. No aortic stenosis is present.  Pulmonic Valve: The pulmonic valve was grossly normal. Pulmonic valve regurgitation is trivial. No evidence of pulmonic stenosis.  Aorta: The aortic root and ascending aorta are structurally normal, with no evidence of dilitation.  Venous: The right lower pulmonary vein is normal. The inferior vena cava is normal in size with greater than 50% respiratory variability, suggesting right atrial pressure of 3 mmHg.  IAS/Shunts: The atrial septum is grossly normal.   LEFT VENTRICLE PLAX 2D LVIDd:         4.60 cm         Diastology LVIDs:         3.30 cm         LV e' medial:    7.94 cm/s LV PW:         1.20 cm         LV E/e' medial:  8.5 LV IVS:        1.30 cm         LV e' lateral:   7.72 cm/s LV E/e' lateral: 8.7  3D Volume EF LV 3D EF:    Left ventricul ar ejection fraction by 3D volume is 62 %.  3D Volume EF: 3D EF:        62 % LV EDV:       119 ml LV ESV:       46 ml LV SV:        74 ml  RIGHT VENTRICLE RV Basal diam:  4.20 cm RV Mid diam:    3.70 cm RV S prime:     8.38 cm/s TAPSE (M-mode): 1.8 cm RVSP:           25.8 mmHg  LEFT ATRIUM             Index        RIGHT ATRIUM           Index LA diam:        3.80 cm 1.69 cm/m   RA Pressure: 3.00 mmHg LA Vol (A2C):   66.5 ml 29.57 ml/m  RA Area:     20.90 cm LA Vol (A4C):   67.4 ml 29.97 ml/m  RA Volume:   66.10 ml  29.39 ml/m LA Biplane Vol: 70.1 ml 31.17 ml/m AORTIC VALVE LVOT Vmax:   73.40 cm/s LVOT Vmean:  55.100 cm/s LVOT VTI:    0.201 m  AORTA Ao Root diam: 3.40 cm  Ao Asc diam:  3.50 cm  MITRAL VALVE               TRICUSPID VALVE MV Area (PHT):             TR Peak grad:   22.8 mmHg MV Decel Time:             TR  Vmax:        239.00 cm/s MV E velocity: 67.10 cm/s  Estimated RAP:  3.00 mmHg MV A velocity: 66.60 cm/s  RVSP:           25.8 mmHg MV E/A ratio:  1.01 SHUNTS Systemic VTI: 0.20 m  Lennie Odor MD Electronically signed by Lennie Odor MD Signature Date/Time: 11/26/2022/8:06:38 PM    Final   TEE  ECHO TEE 04/12/2018  Interpretation Summary  Aortic valve: The valve is trileaflet. No stenosis. No regurgitation.  Mitral valve: Trace regurgitation.  Right ventricle: Normal cavity size, wall thickness and ejection fraction.  Tricuspid valve: Trace regurgitation.  Pulmonic valve: Trace regurgitation.        ______________________________________________________________________________________________       Current Reported Medications:.    Current Meds  Medication Sig   amLODipine (NORVASC) 10 MG tablet Take 1 tablet (10 mg total) by mouth daily.   aspirin EC 81 MG tablet Take 81 mg by mouth daily. Swallow whole.   Bempedoic Acid-Ezetimibe (NEXLIZET) 180-10 MG TABS Take 1 tablet by mouth daily.   fluticasone (FLONASE) 50 MCG/ACT nasal spray SPRAY 2 SPRAYS INTO EACH NOSTRIL EVERY DAY   loratadine (CLARITIN) 10 MG tablet Take 10 mg by mouth daily.   metoprolol tartrate (LOPRESSOR) 25 MG tablet TAKE 1/2 TABLET TWICE A DAY BY MOUTH   Multiple Vitamin (MULTIVITAMIN) tablet Take 1 tablet by mouth daily.   Omega-3 Fatty Acids (FISH OIL) 1000 MG CAPS Take by mouth.   pantoprazole (PROTONIX) 40 MG tablet TAKE 1 TABLET BY MOUTH EVERY DAY   traZODone (DESYREL) 50 MG tablet Take 1-2 tablets (50-100 mg total) by mouth at bedtime. (Patient taking differently: Take 50-100 mg by mouth at bedtime as needed for sleep.)    Physical Exam:    VS:  BP 130/80 (BP Location: Right Arm, Patient Position: Sitting, Cuff Size: Normal)   Pulse (!) 59   Ht 5\' 9"  (1.753 m)   Wt 238 lb 6.4 oz (108.1 kg)   SpO2 95%   BMI 35.21 kg/m    Wt Readings from Last 3 Encounters:  08/04/23 238 lb 6.4 oz  (108.1 kg)  07/10/23 238 lb 9.6 oz (108.2 kg)  07/07/23 241 lb (109.3 kg)    GEN: Well nourished, well developed in no acute distress NECK: No JVD; No carotid bruits CARDIAC: RRR, no murmurs, rubs, gallops RESPIRATORY:  Clear to auscultation without rales, wheezing or rhonchi  ABDOMEN: Soft, non-tender, non-distended EXTREMITIES:  No edema; No acute deformity     Asessement and Plan:.    CAD/atypical chest pain: Patient with STEMI in 03/2018.  He underwent cardiac catheterization revealing a true ostial LAD lesion not amenable for percutaneous invention.  Had right CABG by Dr. Cornelius Moras with LIMA to his LAD and a vein to diagonal branch.  In 05/2022 patient had nuclear stress test that showed low risk with no evidence of ischemia.  Repeat nuclear stress in setting of similar chest pain in 07/07/2022 was normal and low risk.  No significant change compared to prior stress test. Stable with no anginal symptoms. No indication for ischemic evaluation.  Heart healthy diet and regular cardiovascular exercise encouraged.  Continue aspirin 81 mg daily, metoprolol tartrate 25 mg twice daily and amlodipine 10 mg daily.  Hypertension: Blood pressure today 130/80.  Patient's home blood pressure log indicates systolic blood pressures ranging from 130-140.  Of note patient's blood pressure cuff runs 10-15 mmHg higher than a manual cuff. Continue amlodipine 10 mg daily.  Hyperlipidemia: Last lipid profile on 03/18/2023 indicated total cholesterol 137, HDL 39.5, triglycerides 104 and LDL 76.  Patient with history of intolerance to statins and Repatha.  Continue Nexlizet.     Disposition: F/u with Dr. Allyson Sabal or Reather Littler, NP in one year or sooner if needed.   Signed, Rip Harbour, NP

## 2023-08-04 ENCOUNTER — Encounter: Payer: Self-pay | Admitting: Cardiology

## 2023-08-04 ENCOUNTER — Ambulatory Visit: Attending: Cardiology | Admitting: Cardiology

## 2023-08-04 VITALS — BP 130/80 | HR 59 | Ht 69.0 in | Wt 238.4 lb

## 2023-08-04 DIAGNOSIS — I10 Essential (primary) hypertension: Secondary | ICD-10-CM

## 2023-08-04 DIAGNOSIS — R0789 Other chest pain: Secondary | ICD-10-CM | POA: Diagnosis not present

## 2023-08-04 DIAGNOSIS — E782 Mixed hyperlipidemia: Secondary | ICD-10-CM

## 2023-08-04 DIAGNOSIS — I2510 Atherosclerotic heart disease of native coronary artery without angina pectoris: Secondary | ICD-10-CM

## 2023-08-04 NOTE — Patient Instructions (Signed)
 Medication Instructions:  No changes *If you need a refill on your cardiac medications before your next appointment, please call your pharmacy*  Lab Work: No labs If you have labs (blood work) drawn today and your tests are completely normal, you will receive your results only by: MyChart Message (if you have MyChart) OR A paper copy in the mail If you have any lab test that is abnormal or we need to change your treatment, we will call you to review the results.  Testing/Procedures: No testing  Follow-Up: At [facility], you and your health needs are our priority.  As part of our continuing mission to provide you with exceptional heart care, our providers are all part of one team.  This team includes your primary Cardiologist (physician) and Advanced Practice Providers or APPs (Physician Assistants and Nurse Practitioners) who all work together to provide you with the care you need, when you need it.  Your next appointment:   1 year(s)  Provider:   Nanetta Batty, MD   We recommend signing up for the patient portal called "MyChart".  Sign up information is provided on this After Visit Summary.  MyChart is used to connect with patients for Virtual Visits (Telemedicine).  Patients are able to view lab/test results, encounter notes, upcoming appointments, etc.  Non-urgent messages can be sent to your provider as well.   To learn more about what you can do with MyChart, go to [email or web address].   Other Instructions   1st Floor: - Lobby - Registration  - Pharmacy  - Lab - Cafe  2nd Floor: - PV Lab - Diagnostic Testing (echo, CT, nuclear med)  3rd Floor: - Vacant  4th Floor: - TCTS (cardiothoracic surgery) - AFib Clinic - Structural Heart Clinic - Vascular Surgery  - Vascular Ultrasound  5th Floor: - [facility] Cardiology (general and EP) - Clinical Pharmacy for coumadin, hypertension, lipid, weight-loss medications, and med management  appointments    Valet parking services will be available as well.

## 2023-08-05 ENCOUNTER — Encounter: Payer: Self-pay | Admitting: Cardiology

## 2023-10-12 ENCOUNTER — Other Ambulatory Visit: Payer: Self-pay | Admitting: Internal Medicine

## 2024-02-24 ENCOUNTER — Telehealth: Payer: Self-pay | Admitting: Pharmacy Technician

## 2024-02-24 NOTE — Telephone Encounter (Signed)
    Faxed pa info to [phone] as requested

## 2024-02-25 ENCOUNTER — Other Ambulatory Visit: Payer: Self-pay | Admitting: Cardiovascular Disease

## 2024-02-25 ENCOUNTER — Other Ambulatory Visit (HOSPITAL_COMMUNITY): Payer: Self-pay

## 2024-03-04 ENCOUNTER — Ambulatory Visit: Payer: Self-pay

## 2024-03-04 NOTE — Telephone Encounter (Signed)
 FYI Only or Action Required?: FYI only for provider: Unsure if pt will go to UC to be seen.  Patient was last seen in primary care on 03/18/2023 by Jimmy Charlie FERNS, MD.  Called Nurse Triage reporting Foot Pain.  Symptoms began several weeks ago.  Interventions attempted: OTC medications: Tylenol .  Symptoms are: gradually worsening.  Triage Disposition: See PCP When Office is Open (Within 3 Days)  Patient/caregiver understands and will follow disposition?: Unsure     Copied from CRM [id number]. Topic: Clinical - Red Word Triage >> Mar 04, 2024  9:07 AM [name] wrote: Reason for RMF:ejupzwu wife called stating the patient left side of his left foot is causing him pain Reason for Disposition  [1] MODERATE pain (e.g., interferes with normal activities, limping) AND [2] present > 3 days  Answer Assessment - Initial Assessment Questions This RN spoke with pt's wife regarding symptoms. She states the pt takes tylenol  daily. Unable to give further information regarding pt's symptoms. Offered an appointment in office and declined due to pt's schedule. Given information for UC to be seen for symptoms.    1. ONSET: When did the pain start?      A few weeks ago  2. LOCATION: Where is the pain located?      L side of L foot at the top  3. PAIN: How bad is the pain?    (Scale 1-10; or mild, moderate, severe)     Unsure  4. SYMPTOMS: Do you have any other symptoms? (e.g., leg pain, rash, fever, numbness)     Denies  Protocols used: Foot Pain-A-AH

## 2024-03-05 ENCOUNTER — Ambulatory Visit: Admitting: Nurse Practitioner

## 2024-03-05 VITALS — BP 132/60 | HR 55 | Temp 97.6°F | Ht 69.0 in | Wt 229.0 lb

## 2024-03-05 DIAGNOSIS — L84 Corns and callosities: Secondary | ICD-10-CM

## 2024-03-05 NOTE — Progress Notes (Signed)
 Established Patient Office Visit  Subjective   Patient ID: [name], male    DOB: 03/31/1956  Age: 68 y.o. MRN: [id number]  Chief Complaint  Patient presents with   Foot Problem    Pt complains of pain/discomfort in left foot locating in the toes. Ongoing for 3 weeks. States it feels like its getting worse. Complains of feeling like a needle sting.     HPI  Discussed the use of AI scribe software for clinical note transcription with the patient, who gave verbal consent to proceed.  History of Present Illness [name] is a 68 year old male who presents with left foot pain and discomfort.  He has been experiencing pain and discomfort in the left foot, specifically in the toe region, for approximately three weeks. The pain is described as a burning sensation, exacerbated by walking, and intensifies at night. He denies any injury to the area. He mentions a 'weird looking little spot' on the side of his toe, initially thought to be a corn or bunion. The pain worsens by the end of the day, especially after work, despite wearing regular tennis shoes instead of steel-toed shoes to alleviate discomfort.  He has been using a muscle cream similar to Blue Emu and Neosporin, which provides some relief at night but not during the day. No numbness, tingling, weakness, fever, or chills. He reports that he has not been told he has neuropathy.  His current medications include amlodipine  for blood pressure, benpidoic acid and ezetimibe  for cholesterol, metoprolol , pantoprazole  for stomach issues, and trazodone  for sleep, which he takes as needed. He works in a job that requires wearing steel-toed shoes and reports walking 13,000 to 14,000 steps a day.   With a history of RBB, HTN, CAD, Neuropathy, prediabtes, HLD   Review of Systems  Constitutional:  Negative for chills and fever.  Respiratory:  Negative for shortness of breath.   Cardiovascular:  Negative for chest pain.  Musculoskeletal:   Positive for joint pain.  Neurological:  Negative for tingling and weakness.      Objective:     BP 132/60   Pulse (!) 55   Temp 97.6 F (36.4 C) (Oral)   Ht 5' 9 (1.753 m)   Wt 229 lb (103.9 kg)   SpO2 93%   BMI 33.82 kg/m    Physical Exam Vitals and nursing note reviewed.  Constitutional:      Appearance: Normal appearance.  Cardiovascular:     Rate and Rhythm: Normal rate and regular rhythm.     Pulses:          Dorsalis pedis pulses are 2+ on the right side and 2+ on the left side.       Posterior tibial pulses are 2+ on the right side and 2+ on the left side.     Heart sounds: Normal heart sounds.  Pulmonary:     Effort: Pulmonary effort is normal.     Breath sounds: Normal breath sounds.  Musculoskeletal:       Feet:  Neurological:     Mental Status: He is alert.      No results found for any visits on 03/05/24.    The ASCVD Risk score (Arnett DK, et al., 2019) failed to calculate for the following reasons:   Risk score cannot be calculated because patient has a medical history suggesting prior/existing ASCVD    Assessment & Plan:   Problem List Items Addressed This Visit   None Visit Diagnoses  Corn of foot    -  Primary   Relevant Orders   Ambulatory referral to Podiatry      Assessment and Plan Assessment & Plan Corns and callosities of left foot Burning pain in left foot due to corn from shoe friction. No infection or systemic symptoms. Presentation consistent with corn. - Referred to podiatry for evaluation and potential removal using Dremel device. - Advised use of corn disks to reduce friction.   Return if symptoms worsen or fail to improve, for as scheduled.    Adina Crandall, NP

## 2024-03-05 NOTE — Patient Instructions (Signed)
 Nice to see you today  I have referred you to the podiatrist

## 2024-03-08 ENCOUNTER — Other Ambulatory Visit: Payer: Self-pay

## 2024-03-08 DIAGNOSIS — I2510 Atherosclerotic heart disease of native coronary artery without angina pectoris: Secondary | ICD-10-CM

## 2024-03-08 DIAGNOSIS — E782 Mixed hyperlipidemia: Secondary | ICD-10-CM

## 2024-03-08 NOTE — Telephone Encounter (Signed)
 [facility] is requesting a alternative non formulary nexlizet  180-10 mg tablet. Please address

## 2024-03-09 ENCOUNTER — Telehealth: Payer: Self-pay | Admitting: Pharmacy Technician

## 2024-03-09 ENCOUNTER — Other Ambulatory Visit (HOSPITAL_COMMUNITY): Payer: Self-pay

## 2024-03-10 MED ORDER — NEXLIZET 180-10 MG PO TABS: 1.0000 | ORAL_TABLET | Freq: Every day | ORAL | 1 refills | Status: AC

## 2024-03-11 ENCOUNTER — Encounter: Payer: Self-pay | Admitting: Podiatry

## 2024-03-11 ENCOUNTER — Ambulatory Visit (INDEPENDENT_AMBULATORY_CARE_PROVIDER_SITE_OTHER): Payer: Self-pay | Admitting: Podiatry

## 2024-03-11 DIAGNOSIS — M7752 Other enthesopathy of left foot: Secondary | ICD-10-CM

## 2024-03-11 DIAGNOSIS — Q828 Other specified congenital malformations of skin: Secondary | ICD-10-CM | POA: Diagnosis not present

## 2024-03-11 MED ORDER — TRIAMCINOLONE ACETONIDE 10 MG/ML IJ SUSP
10.0000 mg | Freq: Once | INTRAMUSCULAR | Status: AC
  Administered 2024-03-11: 10 mg via INTRA_ARTICULAR

## 2024-03-13 NOTE — Progress Notes (Signed)
 Subjective:   Patient ID: [name], male   DOB: 68 y.o.   MRN: [id number]   HPI Patient presents with a lot of pain in the outside of the left foot stating it has been extremely tender for the last month and hard to walk on it and he does not remember injury.  Patient still works full-time does not smoke likes to be active   Review of Systems  All other systems reviewed and are negative.       Objective:  Physical Exam Vitals and nursing note reviewed.  Constitutional:      Appearance: He is well-developed.  Pulmonary:     Effort: Pulmonary effort is normal.  Musculoskeletal:        General: Normal range of motion.  Skin:    General: Skin is warm.  Neurological:     Mental Status: He is alert.     Neurovascular status found to be intact muscle strength was found to be adequate range of motion adequate with good digital perfusion noted.  Patient is found to have severe inflammation pain around the fifth metatarsal head left with a small lesion with lucent core that is very sore and is well oriented and presents with caregiver     Assessment:  Inflammatory capsulitis fifth MPJ left in conjunction with porokeratotic lesion very tender     Plan:  H&P reviewed I do think this could require 1 point future met head resection with resection but we are trying conservative and it is an inflammatory condition correctly.  At this point I educated him and wife on condition sterile prep injected around the fifth MPJ 2 mg dexamethasone  Kenalog 5 mg Xylocaine  debrided lesion fully applied sterile dressing reappoint to recheck

## 2024-03-19 ENCOUNTER — Ambulatory Visit (INDEPENDENT_AMBULATORY_CARE_PROVIDER_SITE_OTHER): Payer: No Typology Code available for payment source | Admitting: Family Medicine

## 2024-03-19 ENCOUNTER — Encounter: Payer: Self-pay | Admitting: Family Medicine

## 2024-03-19 VITALS — BP 130/68 | HR 54 | Temp 97.6°F | Ht 69.0 in | Wt 229.2 lb

## 2024-03-19 DIAGNOSIS — Z0000 Encounter for general adult medical examination without abnormal findings: Secondary | ICD-10-CM | POA: Diagnosis not present

## 2024-03-19 DIAGNOSIS — Z125 Encounter for screening for malignant neoplasm of prostate: Secondary | ICD-10-CM

## 2024-03-19 DIAGNOSIS — I10 Essential (primary) hypertension: Secondary | ICD-10-CM

## 2024-03-19 DIAGNOSIS — Z7189 Other specified counseling: Secondary | ICD-10-CM | POA: Diagnosis not present

## 2024-03-19 DIAGNOSIS — I2510 Atherosclerotic heart disease of native coronary artery without angina pectoris: Secondary | ICD-10-CM

## 2024-03-19 DIAGNOSIS — E782 Mixed hyperlipidemia: Secondary | ICD-10-CM

## 2024-03-19 DIAGNOSIS — R7303 Prediabetes: Secondary | ICD-10-CM

## 2024-03-19 DIAGNOSIS — K219 Gastro-esophageal reflux disease without esophagitis: Secondary | ICD-10-CM

## 2024-03-19 LAB — COMPREHENSIVE METABOLIC PANEL WITH GFR
ALT: 22 U/L (ref 0–53)
AST: 23 U/L (ref 0–37)
Albumin: 4.5 g/dL (ref 3.5–5.2)
Alkaline Phosphatase: 81 U/L (ref 39–117)
BUN: 17 mg/dL (ref 6–23)
CO2: 31 meq/L (ref 19–32)
Calcium: 9.3 mg/dL (ref 8.4–10.5)
Chloride: 103 meq/L (ref 96–112)
Creatinine, Ser: 0.98 mg/dL (ref 0.40–1.50)
GFR: 78.97 mL/min (ref >60.00)
Glucose, Bld: 119 mg/dL — ABNORMAL HIGH (ref 70–99)
Potassium: 4.4 meq/L (ref 3.5–5.1)
Sodium: 140 meq/L (ref 135–145)
Total Bilirubin: 0.8 mg/dL (ref 0.2–1.2)
Total Protein: 7 g/dL (ref 6.0–8.3)

## 2024-03-19 LAB — PSA: PSA: 0.33 ng/mL (ref 0.10–4.00)

## 2024-03-19 LAB — LIPID PANEL
Cholesterol: 131 mg/dL (ref 0–200)
HDL: 51.9 mg/dL (ref >39.00)
LDL Cholesterol: 65 mg/dL (ref 0–99)
NonHDL: 78.95
Total CHOL/HDL Ratio: 3
Triglycerides: 71 mg/dL (ref 0.0–149.0)
VLDL: 14.2 mg/dL (ref 0.0–40.0)

## 2024-03-19 LAB — HEMOGLOBIN A1C: Hgb A1c MFr Bld: 6.1 % (ref 4.6–6.5)

## 2024-03-19 MED ORDER — PANTOPRAZOLE SODIUM 40 MG PO TBEC: 40.0000 mg | DELAYED_RELEASE_TABLET | Freq: Every day | ORAL | 3 refills | Status: AC

## 2024-03-19 NOTE — Assessment & Plan Note (Signed)
 Chronic, stable continue current regimen.

## 2024-03-19 NOTE — Assessment & Plan Note (Addendum)
 Update A1c H/o diet controlled diabetes on latest check.  Previously on metformin .

## 2024-03-19 NOTE — Assessment & Plan Note (Signed)
 Stable period on pantoprazole 

## 2024-03-19 NOTE — Assessment & Plan Note (Addendum)
 Update FLP on Nexlizet . Goal LDL <70, likely ideal <55  in CAD/CABG hx The ASCVD Risk score (Arnett DK, et al., 2019) failed to calculate for the following reasons:   Risk score cannot be calculated because patient has a medical history suggesting prior/existing ASCVD

## 2024-03-19 NOTE — Progress Notes (Signed)
 Ph: [phone] Fax: [phone]   Patient ID: [name], male    DOB: 22-Oct-1955, 68 y.o.   MRN: [id number]  This visit was conducted in person.  BP 130/68 (BP Location: Right Arm, Cuff Size: Large)   Pulse (!) 54   Temp 97.6 F (36.4 C) (Oral)   Ht 5' 9 (1.753 m)   Wt 229 lb 4 oz (104 kg)   SpO2 100%   BMI 33.85 kg/m   BP Readings from Last 3 Encounters:  03/19/24 130/68  03/05/24 132/60  08/04/23 130/80   CC: transfer of care , CPE Subjective:   HPI: [name] is a 68 y.o. male presenting on 03/19/2024 for Transitions Of Care (Pt here for [facility])   Previously saw Dr Jimmy  I see his son.  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   H/o CAD with STEMI 03/2018, catheterization showing ostial LAD lesion not amenable to percutaneous intervention so underwent 2v CABG by Dr Dusty 2019 with LIMA ot LAD and vain to diagonal branch, atypical chest pain, sees cardiology last 07/2023 continues aspirin , metoprolol  12.5mg  bid, amlodipine  10mg  daily.   HTN - recent home readings fluctuate.  HLD - on Nexlizet  (bempedoic acid , ezetimibe ) given intolerance to statins and Repatha .   Preventative: Colonoscopy 03/2017 - diverticulosis ([place])  Prostate cancer screening - last 2021 WNL, no fmhx prostate cancer or current symptoms Lung cancer screening - not eligible  Flu shot - yearly COVID shot - x3 Tdap 01/2017, 01/2023 Pneumococcal 2008, 2022 Shingrix - discussed, declines Advanced directive discussion - doesn't have this. Would want wife [name] to be HCPOA. Packet provided today.  Seat belt use discussed Sunscreen use discussed. No changing moles on skin. Ex smoker quit 2007 Alcohol - seldom  Dentist - q3 mo - h/o gum disease Eye exam - yearly Bowel - no constipation Bladder -  no incontinence  Lives with wife [name]  Son [name] Occ: sign-maker  Activity: walking at work  Diet: good water , fruits/vegetables some      Relevant  past medical, surgical, family and social history reviewed and updated as indicated. Interim medical history since our last visit reviewed. Allergies and medications reviewed and updated. Outpatient Medications Prior to Visit  Medication Sig Dispense Refill   acetaminophen  (TYLENOL ) 500 MG tablet Take 1,000 mg by mouth daily.     amLODipine  (NORVASC ) 10 MG tablet Take 1 tablet (10 mg total) by mouth daily. 90 tablet 3   aspirin  EC 81 MG tablet Take 81 mg by mouth daily. Swallow whole.     Bempedoic Acid -Ezetimibe  (NEXLIZET ) 180-10 MG TABS Take 1 tablet by mouth daily. 90 tablet 1   fluticasone  (FLONASE ) 50 MCG/ACT nasal spray SPRAY 2 SPRAYS INTO EACH NOSTRIL EVERY DAY 48 mL 3   loratadine  (CLARITIN ) 10 MG tablet Take 10 mg by mouth daily.     metoprolol  tartrate (LOPRESSOR ) 25 MG tablet TAKE 1/2 TABLET TWICE A DAY BY MOUTH 90 tablet 1   Multiple Vitamin (MULTIVITAMIN) tablet Take 1 tablet by mouth daily.     Omega-3 Fatty Acids (FISH OIL) 1000 MG CAPS Take by mouth.     traZODone  (DESYREL ) 50 MG tablet Take 1-2 tablets (50-100 mg total) by mouth at bedtime. (Patient taking differently: Take 50-100 mg by mouth at bedtime as needed for sleep.) 180 tablet 3   HYDROcodone  bit-homatropine (HYCODAN) 5-1.5 MG/5ML syrup Take 5 mLs by mouth at bedtime as needed for cough. 120 mL 0   pantoprazole  (  PROTONIX ) 40 MG tablet TAKE 1 TABLET BY MOUTH EVERY DAY 90 tablet 1   No facility-administered medications prior to visit.     Per HPI unless specifically indicated in ROS section below Review of Systems  Constitutional:  Negative for activity change, appetite change, chills, fatigue, fever and unexpected weight change.  HENT:  Negative for hearing loss.   Eyes:  Negative for visual disturbance.  Respiratory:  Negative for cough, chest tightness, shortness of breath and wheezing.   Cardiovascular:  Negative for chest pain, palpitations and leg swelling.  Gastrointestinal:  Negative for abdominal  distention, abdominal pain, blood in stool, constipation, diarrhea, nausea and vomiting.  Genitourinary:  Negative for difficulty urinating and hematuria.  Musculoskeletal:  Negative for arthralgias, myalgias and neck pain.  Skin:  Negative for rash.  Neurological:  Negative for dizziness, seizures, syncope and headaches.  Hematological:  Negative for adenopathy. Does not bruise/bleed easily.  Psychiatric/Behavioral:  Negative for dysphoric mood. The patient is not nervous/anxious.     Objective:  BP 130/68 (BP Location: Right Arm, Cuff Size: Large)   Pulse (!) 54   Temp 97.6 F (36.4 C) (Oral)   Ht 5' 9 (1.753 m)   Wt 229 lb 4 oz (104 kg)   SpO2 100%   BMI 33.85 kg/m   Wt Readings from Last 3 Encounters:  03/19/24 229 lb 4 oz (104 kg)  03/05/24 229 lb (103.9 kg)  08/04/23 238 lb 6.4 oz (108.1 kg)      Physical Exam Vitals and nursing note reviewed.  Constitutional:      General: He is not in acute distress.    Appearance: Normal appearance. He is well-developed. He is not ill-appearing.  HENT:     Head: Normocephalic and atraumatic.     Right Ear: Hearing, tympanic membrane, ear canal and external ear normal.     Left Ear: Hearing, tympanic membrane, ear canal and external ear normal.     Mouth/Throat:     Mouth: Mucous membranes are moist.     Pharynx: Oropharynx is clear. No oropharyngeal exudate or posterior oropharyngeal erythema.  Eyes:     General: No scleral icterus.    Extraocular Movements: Extraocular movements intact.     Conjunctiva/sclera: Conjunctivae normal.     Pupils: Pupils are equal, round, and reactive to light.  Neck:     Thyroid: No thyroid mass or thyromegaly.     Vascular: No carotid bruit.  Cardiovascular:     Rate and Rhythm: Normal rate and regular rhythm.     Pulses: Normal pulses.          Radial pulses are 2+ on the right side and 2+ on the left side.     Heart sounds: Normal heart sounds. No murmur heard. Pulmonary:     Effort:  Pulmonary effort is normal. No respiratory distress.     Breath sounds: Normal breath sounds. No wheezing, rhonchi or rales.  Abdominal:     General: Bowel sounds are normal. There is no distension.     Palpations: Abdomen is soft. There is no mass.     Tenderness: There is no abdominal tenderness. There is no guarding or rebound.     Hernia: No hernia is present.  Musculoskeletal:        General: Normal range of motion.     Cervical back: Normal range of motion and neck supple.     Right lower leg: No edema.     Left lower leg: No edema.  Lymphadenopathy:     Cervical: No cervical adenopathy.  Skin:    General: Skin is warm and dry.     Findings: No rash.  Neurological:     General: No focal deficit present.     Mental Status: He is alert and oriented to person, place, and time.  Psychiatric:        Mood and Affect: Mood normal.        Behavior: Behavior normal.        Thought Content: Thought content normal.        Judgment: Judgment normal.       Results for orders placed or performed in visit on 07/07/23  MYOCARDIAL PERFUSION IMAGING   Collection Time: 07/07/23 10:28 AM  Result Value Ref Range   Rest Nuclear Isotope Dose 10.8 mCi   Stress Nuclear Isotope Dose 32.2 mCi   Rest HR 60.0 bpm   Rest BP 151/82 mmHg   Peak HR 82 bpm   Peak BP 147/71 mmHg   SSS 2.0    SRS 0.0    SDS 2.0    TID 1.03    LV sys vol 41.0 mL   LV dias vol 104.0 62 - 150 mL   Nuc Stress EF 61 %   ST Depression (mm) 0 mm   Lab Results  Component Value Date   PSA 0.41 11/26/2019   PSA 0.46 12/27/2013   PSA 0.42 11/18/2011   Lab Results  Component Value Date   CHOL 137 03/18/2023   HDL 39.50 03/18/2023   LDLCALC 76 03/18/2023   LDLDIRECT 171.0 12/25/2012   TRIG 104.0 03/18/2023   CHOLHDL 3 03/18/2023   Lab Results  Component Value Date   HGBA1C 6.5 03/18/2023   Lab Results  Component Value Date   NA 144 07/02/2023   CL 105 07/02/2023   K 4.1 07/02/2023   CO2 20 07/02/2023    BUN 16 07/02/2023   CREATININE 0.99 07/02/2023   EGFR 83 07/02/2023   CALCIUM  9.6 07/02/2023   PHOS 2.7 03/02/2021   ALBUMIN  4.6 03/18/2023   GLUCOSE 96 07/02/2023   Assessment & Plan:   Problem List Items Addressed This Visit     Routine general medical examination at a health care facility - Primary (Chronic)   Preventative protocols reviewed and updated unless pt declined. Discussed healthy diet and lifestyle.  Does not yet have medicare      Advanced directives, counseling/discussion (Chronic)   Advanced directive discussion - doesn't have this. Would want wife [name] to be HCPOA. Packet provided today.       HLD (hyperlipidemia)   Update FLP on Nexlizet . Goal LDL <70, likely ideal <55  in CAD/CABG hx The ASCVD Risk score (Arnett DK, et al., 2019) failed to calculate for the following reasons:   Risk score cannot be calculated because patient has a medical history suggesting prior/existing ASCVD       Relevant Orders   Lipid panel   Comprehensive metabolic panel with GFR   Essential hypertension, benign   Chronic, stable. continue current regimen       GERD   Stable period on pantoprazole       Relevant Medications   pantoprazole  (PROTONIX ) 40 MG tablet   Prediabetes   Update A1c H/o diet controlled diabetes on latest check.  Previously on metformin .       Relevant Orders   Hemoglobin A1c   CAD (coronary artery disease)   Appreciate cardiology care.  Other Visit Diagnoses       Special screening for malignant neoplasm of prostate       Relevant Orders   PSA        Meds ordered this encounter  Medications   pantoprazole  (PROTONIX ) 40 MG tablet    Sig: Take 1 tablet (40 mg total) by mouth daily.    Dispense:  90 tablet    Refill:  3    Orders Placed This Encounter  Procedures   Lipid panel   Comprehensive metabolic panel with GFR   Hemoglobin A1c   PSA    Patient Instructions  Labs today  You are doing well today  Nice to see  you!  Consider shingrix shots (shingles vaccines) Advanced directive packet provided today  Return as needed or in 1 year for next physical   Follow up plan: Return in about 1 year (around 03/19/2025) for annual exam, prior fasting for blood work.  Anton Blas, MD

## 2024-03-19 NOTE — Patient Instructions (Addendum)
 Labs today  You are doing well today  Nice to see you!  Consider shingrix shots (shingles vaccines) Advanced directive packet provided today  Return as needed or in 1 year for next physical

## 2024-03-19 NOTE — Assessment & Plan Note (Signed)
Appreciate cardiology care.  °

## 2024-03-19 NOTE — Assessment & Plan Note (Signed)
 Advanced directive discussion - doesn't have this. Would want wife [name] to be HCPOA. Packet provided today.

## 2024-03-19 NOTE — Assessment & Plan Note (Addendum)
 Preventative protocols reviewed and updated unless pt declined. Discussed healthy diet and lifestyle.  Does not yet have medicare

## 2024-03-22 ENCOUNTER — Ambulatory Visit: Payer: Self-pay | Admitting: Family Medicine

## 2024-04-12 ENCOUNTER — Telehealth: Payer: Self-pay

## 2024-04-12 NOTE — Telephone Encounter (Signed)
 Patient's wife called to schedule a follow-up appointment. Wife states patient is experiencing significant discomfort from lesion on foot.

## 2024-04-14 ENCOUNTER — Ambulatory Visit (INDEPENDENT_AMBULATORY_CARE_PROVIDER_SITE_OTHER)

## 2024-04-14 ENCOUNTER — Encounter: Payer: Self-pay | Admitting: Podiatry

## 2024-04-14 ENCOUNTER — Ambulatory Visit: Admitting: Podiatry

## 2024-04-14 DIAGNOSIS — M21622 Bunionette of left foot: Secondary | ICD-10-CM

## 2024-04-14 DIAGNOSIS — M7752 Other enthesopathy of left foot: Secondary | ICD-10-CM

## 2024-04-15 NOTE — Progress Notes (Signed)
 Subjective:   Patient ID: [name], male   DOB: 68 y.o.   MRN: [id number]   HPI Patient states I only had several weeks of relief with the treatment rendered last month.  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  He is willing to accept the risk of procedure I allowed him to read consent form for metatarsal head resection possible skin resection and he signed after we reviewed alternative treatments complications.  I did dispense air fracture walker as I want to keep him immobilized for several weeks after the procedure and keep all weight off the area and I explained to him boot usage and I want him to get used to it prior to the procedure.  Patient is scheduled for outpatient surgery currently and he is fitted properly and will have this done soon as he is off of work for 2 weeks starting next week which will give him a perfect time to get this corrected  X-rays indicate lesion to be on the fifth metatarsal head moderate enlargement of the head noted

## 2024-04-16 ENCOUNTER — Telehealth (HOSPITAL_BASED_OUTPATIENT_CLINIC_OR_DEPARTMENT_OTHER): Payer: Self-pay | Admitting: *Deleted

## 2024-04-16 ENCOUNTER — Telehealth: Payer: Self-pay | Admitting: Cardiovascular Disease

## 2024-04-16 ENCOUNTER — Ambulatory Visit: Attending: Student | Admitting: Student

## 2024-04-16 DIAGNOSIS — Z01810 Encounter for preprocedural cardiovascular examination: Secondary | ICD-10-CM

## 2024-04-16 NOTE — Telephone Encounter (Signed)
" ° °  Name: [name]  DOB: [birth year]  MRN: [id number]  Primary Cardiologist: Dorn Lesches, MD  Chart reviewed as part of pre-operative protocol coverage. Because of [name]'s past medical history and time since last visit, he will require a follow-up telephone visit in order to better assess preoperative cardiovascular risk.  Pre-op covering staff: - Please schedule appointment and call patient to inform them. If patient already had an upcoming appointment within acceptable timeframe, please add pre-op clearance to the appointment notes so provider is aware. - Please contact requesting surgeon's office via preferred method (i.e, phone, fax) to inform them of need for appointment prior to surgery.  Can hold ASA for 5-7 days if he remains asymptomatic from a cardiac standpoint. He is s/p CABG 03/2018.   [name] LOISE Fabry, PA-C  04/16/2024, 2:06 PM   "

## 2024-04-16 NOTE — Progress Notes (Signed)
 "   Virtual Visit via Telephone Note   Because of [name]'s co-morbid illnesses, he is at least at moderate risk for complications without adequate follow up.  This format is felt to be most appropriate for this patient at this time.  The patient did not have access to video technology/had technical difficulties with video requiring transitioning to audio format only (telephone).  All issues noted in this document were discussed and addressed.  No physical exam could be performed with this format.  Please refer to the patient's chart for his consent to telehealth for [facility].  Evaluation Performed:  Preoperative cardiovascular risk assessment _____________   Date:  04/16/2024   Patient ID:  [name], DOB 1956-03-04, MRN [id number] Patient Location:  Home Provider location:   Office  Primary Care Provider:  Rilla Baller, MD  Primary Cardiologist:  [facility] Providers Cardiologist:  Dorn Lesches, MD    Chief Complaint / Patient Profile   68 y.o. y/o male with a h/o CAD s/p CABG x 30 March 2018 in the setting of STEMI, normal/low risk nuclear stress test March 2025, RBBB, hypertension, hyperlipidemia, GERD who is pending left fifth metatarsal head resection/excision of benign lesion left foot by Dr. Magdalen and presents today for telephonic preoperative cardiovascular risk assessment.  History of Present Illness    [name] is a 68 y.o. male who presents via audio/video conferencing for a telehealth visit today.  Pt was last seen in cardiology clinic on 08/04/2023 by Katlyn West, NP.  At that time [name] was stable from a cardiac standpoint.  The patient is now pending procedure as outlined above. Since his last visit, he is doing well. Patient denies shortness of breath, dyspnea on exertion, lower extremity edema, orthopnea or PND. No chest pain, pressure, or tightness. No palpitations. He is not experiencing lightheadedness, dizziness, presyncope  or syncope.  He is active at work and around his home. He gets >10,000 steps a day.   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   LIMA to LAD, SVG to D1, EVH via right thigh   Sleep disorder    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) 04/12/2018   Wrist fracture     6 foot fall onto head, concussion, L wrist (comminuted with intra-articular extension) and 2nd finger (middle phalanx) fxs, vertigo   Past Surgical History:  Procedure Laterality Date   BRANCHIAL CLEFT CYST EXCISION     CARDIAC CATHETERIZATION     COLONOSCOPY  04/03/2007   CORONARY ARTERY BYPASS GRAFT N/A 04/12/2018   Procedure: CORONARY ARTERY BYPASS GRAFTING (CABG) x two , using left internal mammary artery and right leg greater saphenous vein harvested endoscopically;  Surgeon: Dusty Sudie DEL, MD;  Location: [facility] OR;  Service: Open Heart Surgery;  Laterality: N/A;   CORONARY/GRAFT ACUTE MI REVASCULARIZATION N/A 04/12/2018   Procedure: Coronary/Graft Acute MI Revascularization;  Surgeon: Wonda Sharper, MD;  Location: [facility] INVASIVE CV LAB;  Service: Cardiovascular;  Laterality: N/A;   HEMORRHOID SURGERY     LEFT HEART CATH AND CORONARY ANGIOGRAPHY N/A  04/12/2018   Procedure: LEFT HEART CATH AND CORONARY ANGIOGRAPHY;  Surgeon: Wonda Sharper, MD;  Location: [facility] INVASIVE CV LAB;  Service: Cardiovascular;  Laterality: N/A;   RHINOPLASTY     TEE WITHOUT CARDIOVERSION N/A 04/12/2018   Procedure: TRANSESOPHAGEAL ECHOCARDIOGRAM (TEE);  Surgeon: Dusty Sudie DEL, MD;  Location: [facility] OR;  Service: Open Heart Surgery;  Laterality: N/A;   TOTAL KNEE ARTHROPLASTY Right 11/21/2020    Procedure: TOTAL KNEE ARTHROPLASTY;  Surgeon: Ernie Cough, MD;  Location: [facility] ORS;  Service: Orthopedics;  Laterality: Right;    Allergies  Allergies[1]  Home Medications    Prior to Admission medications  Medication Sig Start Date End Date Taking? Authorizing Provider  acetaminophen  (TYLENOL ) 500 MG tablet Take 1,000 mg by mouth daily.    [provider]  amLODipine  (NORVASC ) 10 MG tablet Take 1 tablet (10 mg total) by mouth daily. 07/10/23 04/16/24  West, Katlyn D, NP  aspirin  EC 81 MG tablet Take 81 mg by mouth daily. Swallow whole.    [provider]  Bempedoic Acid -Ezetimibe  (NEXLIZET ) 180-10 MG TABS Take 1 tablet by mouth daily. 03/10/24   West, Katlyn D, NP  fluticasone  (FLONASE ) 50 MCG/ACT nasal spray SPRAY 2 SPRAYS INTO EACH NOSTRIL EVERY DAY 06/06/23   Letvak, Richard I, MD  loratadine  (CLARITIN ) 10 MG tablet Take 10 mg by mouth daily.    [provider]  metoprolol  tartrate (LOPRESSOR ) 25 MG tablet TAKE 1/2 TABLET TWICE A DAY BY MOUTH 02/25/24   Court Dorn PARAS, MD  Multiple Vitamin (MULTIVITAMIN) tablet Take 1 tablet by mouth daily.    [provider]  Omega-3 Fatty Acids (FISH OIL) 1000 MG CAPS Take by mouth.    [provider]  pantoprazole  (PROTONIX ) 40 MG tablet Take 1 tablet (40 mg total) by mouth daily. 03/19/24   Rilla Baller, MD  traZODone  (DESYREL ) 50 MG tablet Take 1-2 tablets (50-100 mg total) by mouth at bedtime. Patient taking differently: Take 50-100 mg by mouth at bedtime as needed for sleep. 06/06/22   Jimmy Charlie FERNS, MD    Physical Exam    Vital Signs:  [name] does not have vital signs available for review today.  Given telephonic nature of communication, physical exam is limited. AAOx3. NAD. Normal affect.  Speech and respirations are unlabored.  Accessory Clinical Findings    Cardiac Studies & Procedures    ______________________________________________________________________________________________ CARDIAC CATHETERIZATION  CARDIAC CATHETERIZATION 04/12/2018  Conclusion  Dist LM to Ost LAD lesion is 90% stenosed.  Prox LAD to Mid LAD lesion is 95% stenosed.  There is mild left ventricular systolic dysfunction.  LV end diastolic pressure is normal.  The left ventricular ejection fraction is 50-55% by visual estimate.  1.  Severe single-vessel coronary artery disease involving the ostium and the midportion of the LAD which correlates with the patient's acute injury current on EKG 2.  Patent left mainstem, left circumflex, and RCA with mild nonobstructive stenosis 3.  Mild contraction abnormality of the LV apex with preserved overall LVEF estimated at 50 to 55%  Recommendations: With ostial LAD involvement, recommend emergency coronary bypass grafting.  Cardiac surgical consultation is placed.  Findings Coronary Findings Diagnostic  Dominance: Right  Left Main Dist LM to Ost LAD lesion is 90% stenosed. The lesion is eccentric.  Left Anterior Descending Prox LAD to Mid LAD lesion is 95% stenosed. The lesion is eccentric and thrombotic.  Left Circumflex Vessel is large. The vessel exhibits minimal luminal irregularities.  Right Coronary Artery There is  mild diffuse disease throughout the vessel.  Intervention  No interventions have been documented.   STRESS TESTS  MYOCARDIAL PERFUSION IMAGING 07/07/2023  Interpretation Summary   The study is normal. The study is low risk.   No ST deviation was noted.   LV perfusion is normal.   Left ventricular function is normal. Nuclear stress EF: 61%. The left ventricular ejection fraction is normal (55-65%). End diastolic cavity size is normal.   Prior study available for comparison from 05/30/2022. No changes compared to prior study.   Electronically signed by [name] Balding, MD  Low risk stress nuclear study with normal perfusion  and normal left ventricular regional and global systolic function.   ECHOCARDIOGRAM  ECHOCARDIOGRAM COMPLETE 11/26/2022  Narrative ECHOCARDIOGRAM REPORT    Patient Name:   [name]   Date of Exam: 11/26/2022 Medical Rec #:  [id number]     Height:       70.0 in Accession #:    [id number]    Weight:       238.4 lb Date of Birth:  [birth year]    BSA:          2.249 m Patient Age:    68 years      BP:           141/78 mmHg Patient Gender: M             HR:           64 bpm. Exam Location:  [address]  Procedure: 2D Echo, 3D Echo, Cardiac Doppler and Color Doppler  Indications:    R07.9 Chest Pain  History:        Patient has prior history of Echocardiogram examinations. CAD and STEMI, Prior CABG; Risk Factors:Hypertension and HLD.  Sonographer:    Waldo Guadalajara RCS Referring Phys: [id number] JESSE M CLEAVER  IMPRESSIONS   1. Left ventricular ejection fraction, by estimation, is 60 to 65%. Left ventricular ejection fraction by 3D volume is 62 %. The left ventricle has normal function. The left ventricle has no regional wall motion abnormalities. There is mild concentric left ventricular hypertrophy. Left ventricular diastolic parameters were normal. 2. Right ventricular systolic function is normal. The right ventricular size is mildly enlarged. There is normal pulmonary artery systolic pressure. The estimated right ventricular systolic pressure is 25.8 mmHg. 3. Right atrial size was mildly dilated. 4. The mitral valve is grossly normal. Trivial mitral valve regurgitation. No evidence of mitral stenosis. 5. The aortic valve is tricuspid. Aortic valve regurgitation is not visualized. No aortic stenosis is present. 6. The inferior vena cava is normal in size with greater than 50% respiratory variability, suggesting right atrial pressure of 3 mmHg.  FINDINGS Left Ventricle: Left ventricular ejection fraction, by estimation, is 60 to 65%. Left ventricular ejection fraction by 3D volume  is 62 %. The left ventricle has normal function. The left ventricle has no regional wall motion abnormalities. The left ventricular internal cavity size was normal in size. There is mild concentric left ventricular hypertrophy. Left ventricular diastolic parameters were normal.  Right Ventricle: The right ventricular size is mildly enlarged. No increase in right ventricular wall thickness. Right ventricular systolic function is normal. There is normal pulmonary artery systolic pressure. The tricuspid regurgitant velocity is 2.39 m/s, and with an assumed right atrial pressure of 3 mmHg, the estimated right ventricular systolic pressure is 25.8 mmHg.  Left Atrium: Left atrial size was normal in size.  Right Atrium: Right atrial size was mildly dilated.  Pericardium:  There is no evidence of pericardial effusion.  Mitral Valve: The mitral valve is grossly normal. Trivial mitral valve regurgitation. No evidence of mitral valve stenosis.  Tricuspid Valve: The tricuspid valve is grossly normal. Tricuspid valve regurgitation is mild . No evidence of tricuspid stenosis.  Aortic Valve: The aortic valve is tricuspid. Aortic valve regurgitation is not visualized. No aortic stenosis is present.  Pulmonic Valve: The pulmonic valve was grossly normal. Pulmonic valve regurgitation is trivial. No evidence of pulmonic stenosis.  Aorta: The aortic root and ascending aorta are structurally normal, with no evidence of dilitation.  Venous: The right lower pulmonary vein is normal. The inferior vena cava is normal in size with greater than 50% respiratory variability, suggesting right atrial pressure of 3 mmHg.  IAS/Shunts: The atrial septum is grossly normal.   LEFT VENTRICLE PLAX 2D LVIDd:         4.60 cm         Diastology LVIDs:         3.30 cm         LV e' medial:    7.94 cm/s LV PW:         1.20 cm         LV E/e' medial:  8.5 LV IVS:        1.30 cm         LV e' lateral:   7.72 cm/s LV E/e'  lateral: 8.7  3D Volume EF LV 3D EF:    Left ventricul ar ejection fraction by 3D volume is 62 %.  3D Volume EF: 3D EF:        62 % LV EDV:       119 ml LV ESV:       46 ml LV SV:        74 ml  RIGHT VENTRICLE RV Basal diam:  4.20 cm RV Mid diam:    3.70 cm RV S prime:     8.38 cm/s TAPSE (M-mode): 1.8 cm RVSP:           25.8 mmHg  LEFT ATRIUM             Index        RIGHT ATRIUM           Index LA diam:        3.80 cm 1.69 cm/m   RA Pressure: 3.00 mmHg LA Vol (A2C):   66.5 ml 29.57 ml/m  RA Area:     20.90 cm LA Vol (A4C):   67.4 ml 29.97 ml/m  RA Volume:   66.10 ml  29.39 ml/m LA Biplane Vol: 70.1 ml 31.17 ml/m AORTIC VALVE LVOT Vmax:   73.40 cm/s LVOT Vmean:  55.100 cm/s LVOT VTI:    0.201 m  AORTA Ao Root diam: 3.40 cm Ao Asc diam:  3.50 cm  MITRAL VALVE               TRICUSPID VALVE MV Area (PHT):             TR Peak grad:   22.8 mmHg MV Decel Time:             TR Vmax:        239.00 cm/s MV E velocity: 67.10 cm/s  Estimated RAP:  3.00 mmHg MV A velocity: 66.60 cm/s  RVSP:           25.8 mmHg MV E/A ratio:  1.01 SHUNTS Systemic VTI: 0.20 m  Darryle Decent MD  Electronically signed by Darryle Decent MD Signature Date/Time: 11/26/2022/8:06:38 PM    Final   ______________________________________________________________________________________________       Assessment & Plan    Preoperative cardiovascular risk assessment.  Left fifth metatarsal head resection/excision of benign lesion left foot by Dr. Magdalen on 04/20/2024  Chart reviewed as part of pre-operative protocol coverage. According to the RCRI, patient has a 0.9% risk of MACE. Patient reports activity equivalent to >4.0 METS (works full time, gets >10,000 steps a day).   Given past medical history and time since last visit, based on ACC/AHA guidelines, Sol Englert would be at acceptable risk for the planned procedure without further cardiovascular testing.   Patient was advised that if he  develops new symptoms prior to surgery to contact our office to arrange a follow-up appointment.  he verbalized understanding.  Ideally aspirin  should be continued without interruption, however if the bleeding risk is too great, aspirin  may be held for 5-7 days prior to surgery. Please resume aspirin  post operatively when it is felt to be safe from a bleeding standpoint.    I will route this recommendation to the requesting party via Epic fax function.  Please call with questions.  Time:   Today, I have spent 6 minutes with the patient with telehealth technology discussing medical history, symptoms, and management plan.     Barnie Hila, NP  04/16/2024, 2:40 PM     [1]  Allergies Allergen Reactions   Atorvastatin     REACTION: muscle aches   Morphine  And Codeine Itching   Repatha   [Evolocumab ]     Muscle pain   Simvastatin     REACTION: hand pain   Pravachol  [Pravastatin  Sodium]     Muscle aching   "

## 2024-04-16 NOTE — Telephone Encounter (Signed)
"  ° °  Pre-operative Risk Assessment    Patient Name: [name]  DOB: 02-06-1956 MRN: [id number]   Date of last office visit: 08/04/23 Date of next office visit: tbd    Request for Surgical Clearance    Procedure:  Left fifth metatarsal head resection, excision of benign lesion over 4cm both on left foot    Date of Surgery:  Clearance 04/20/24                                Surgeon:  Dr. Pasco Ovens  Surgeon's Group or Practice Name:  [facility]  Phone number:  [phone]  Fax number:  [phone]   Type of Clearance Requested:   - Pharmacy:  Hold Aspirin  defer to cardiology    Type of Anesthesia:  General    Additional requests/questions:    Bonney Larraine Salt   04/16/2024, 10:53 AM   "

## 2024-04-16 NOTE — Telephone Encounter (Signed)
 Pt has been added on today due to procedure 04/20/24. Our office only received the request today. I asked the pt if had taken his ASA today; pt answered no. I said hold off on anymore ASA until after the procedure. I informed the pt that Barnie Hila, NP will go over recommendations as well at 3 pm  today.   Med rec and consent are done.

## 2024-04-16 NOTE — Telephone Encounter (Signed)
 Pt has been added on today due to procedure 04/20/24. Our office only received the request today. I asked the pt if had taken his ASA today; pt answered no. I said hold off on anymore ASA until after the procedure. I informed the pt that Barnie Hila, NP will go over recommendations as well at 3 pm  today.   Med rec and consent are done.      Patient Consent for Virtual Visit        Tynell Winchell has provided verbal consent on 04/16/2024 for a virtual visit (video or telephone).   CONSENT FOR VIRTUAL VISIT FOR:  Jerel Pa  By participating in this virtual visit I agree to the following:  I hereby voluntarily request, consent and authorize [facility] and its employed or contracted physicians, physician assistants, nurse practitioners or other licensed health care professionals (the Practitioner), to provide me with telemedicine health care services (the Services) as deemed necessary by the treating Practitioner. I acknowledge and consent to receive the Services by the Practitioner via telemedicine. I understand that the telemedicine visit will involve communicating with the Practitioner through live audiovisual communication technology and the disclosure of certain medical information by electronic transmission. I acknowledge that I have been given the opportunity to request an in-person assessment or other available alternative prior to the telemedicine visit and am voluntarily participating in the telemedicine visit.  I understand that I have the right to withhold or withdraw my consent to the use of telemedicine in the course of my care at any time, without affecting my right to future care or treatment, and that the Practitioner or I may terminate the telemedicine visit at any time. I understand that I have the right to inspect all information obtained and/or recorded in the course of the telemedicine visit and may receive copies of available information for a reasonable fee.   I understand that some of the potential risks of receiving the Services via telemedicine include:  Delay or interruption in medical evaluation due to technological equipment failure or disruption; Information transmitted may not be sufficient (e.g. poor resolution of images) to allow for appropriate medical decision making by the Practitioner; and/or  In rare instances, security protocols could fail, causing a breach of personal health information.  Furthermore, I acknowledge that it is my responsibility to provide information about my medical history, conditions and care that is complete and accurate to the best of my ability. I acknowledge that Practitioner's advice, recommendations, and/or decision may be based on factors not within their control, such as incomplete or inaccurate data provided by me or distortions of diagnostic images or specimens that may result from electronic transmissions. I understand that the practice of medicine is not an exact science and that Practitioner makes no warranties or guarantees regarding treatment outcomes. I acknowledge that a copy of this consent can be made available to me via my patient portal [facility] MyChart), or I can request a printed copy by calling the office of [facility].    I understand that my insurance will be billed for this visit.   I have read or had this consent read to me. I understand the contents of this consent, which adequately explains the benefits and risks of the Services being provided via telemedicine.  I have been provided ample opportunity to ask questions regarding this consent and the Services and have had my questions answered to my satisfaction. I give my informed consent for the services to be  provided through the use of telemedicine in my medical care

## 2024-04-19 ENCOUNTER — Telehealth: Payer: Self-pay | Admitting: Podiatry

## 2024-04-19 MED ORDER — HYDROCODONE-ACETAMINOPHEN 10-325 MG PO TABS: 1.0000 | ORAL_TABLET | Freq: Three times a day (TID) | ORAL | 0 refills | Status: AC | PRN

## 2024-04-19 NOTE — Addendum Note (Signed)
 Addended by: MAGDALEN PASCO RAMAN on: 04/19/2024 05:02 PM   Modules accepted: Orders

## 2024-04-19 NOTE — Telephone Encounter (Signed)
 DOS- 04/20/2024  LEFT METATARSAL HEAD RES.- [id number] LEFT EXC. BENIGN LESION OVER 4.0 CM  PER FAX FROM [facility] NO PRE-CERTIFICATION REQUIRED FOR CPT CODES. AUTHORIZATION ATTACHED TO SURGERY PACKET.

## 2024-04-20 DIAGNOSIS — M7752 Other enthesopathy of left foot: Secondary | ICD-10-CM | POA: Diagnosis not present

## 2024-04-20 DIAGNOSIS — D492 Neoplasm of unspecified behavior of bone, soft tissue, and skin: Secondary | ICD-10-CM | POA: Diagnosis not present

## 2024-04-20 DIAGNOSIS — M21622 Bunionette of left foot: Secondary | ICD-10-CM | POA: Diagnosis not present

## 2024-04-26 ENCOUNTER — Encounter: Payer: Self-pay | Admitting: Podiatry

## 2024-04-26 ENCOUNTER — Ambulatory Visit: Admitting: Podiatry

## 2024-04-26 ENCOUNTER — Ambulatory Visit (INDEPENDENT_AMBULATORY_CARE_PROVIDER_SITE_OTHER)

## 2024-04-26 DIAGNOSIS — M7752 Other enthesopathy of left foot: Secondary | ICD-10-CM

## 2024-04-27 NOTE — Progress Notes (Signed)
 Subjective:   Patient ID: [name], male   DOB: 68 y.o.   MRN: [id number]   HPI Patient states doing very well with surgery very pleased so far   ROS      Objective:  Physical Exam  Vascular status intact negative [place]' sign noted wound edges coapted well stitches intact fifth metatarsal plantar fifth foot     Assessment:  Doing well post met head resection and resection of lesion left     Plan:  X-ray reviewed sterile dressing reapplied continue elevation compression immobilization surgical shoe dispensed reappoint 2 weeks for removal earlier if needed  X-rays indicate satisfactory resection of bone good alignment noted

## 2024-05-05 ENCOUNTER — Encounter: Payer: Self-pay | Admitting: Podiatry

## 2024-05-06 ENCOUNTER — Telehealth: Payer: Self-pay

## 2024-05-06 NOTE — Telephone Encounter (Signed)
 FYI: Patient's wife called regarding incision site. She stated that they sent a mychart message on 05/06/24 asking if the area looked infected. I returned call to patient's wife and she states that she thinks that area may have just been irritated on yesterday due to the patient being up and being in the boot. She states that the redness is gone. She denies any drainage from the area. Looking at the pictures area does not appear to be infected. I instructed her that if patient does develop any drainage or discoloration of the area to give us  a call back and schedule appointment to be seen. She verbalized her understanding and agreed.

## 2024-05-10 ENCOUNTER — Ambulatory Visit (INDEPENDENT_AMBULATORY_CARE_PROVIDER_SITE_OTHER)

## 2024-05-10 ENCOUNTER — Ambulatory Visit: Admitting: Podiatry

## 2024-05-10 DIAGNOSIS — M7752 Other enthesopathy of left foot: Secondary | ICD-10-CM

## 2024-05-10 DIAGNOSIS — M21622 Bunionette of left foot: Secondary | ICD-10-CM

## 2024-05-10 NOTE — Progress Notes (Unsigned)
 Patient presents for post-op visit today, POV #2 DOS 04/20/2024 LT 5TH METATARSAL HEAD RES, LT EXC BENIGN LESION OVER 4.0 CM  Doing pretty good, a little tender..  Notes: n/a  Vital Signs: Today's Vitals   05/10/24 1528  PainSc: 0-No pain      Radiographs: [x]  Taken []  Not taken  Surgical Site Assessment:  - Dressing:  []  Minimal dry blood, intact []  Reinforced   []  Changed     -Notes: no dressing in place  - Incision:  [x]  CDI (clean, dry, intact)  [x]  Mild erythema  []  Drainage noted   -Notes: n/a  - Swelling:  []  None  [x]  Mild  []  Moderate   []  Significant     -Notes: n/a  - Bruising:  []  None  [x]  Present: base of LT 2nd -4th toes   - Sutures/Staples:  []  None [x]  Intact  [x]  Removed Today  []  Plan to remove at next visit   -Cast/Splint/Pins: [x]  None []  Intact []  Removed Today []  Plan to remove at next visit []  Replaced  -Signs of infection:  [x]  None  []  Present - Describe: n/a  -DME:    []  None []  AFW [x]  Surgical shoe []  Cast  []  Splint  -Walking status:  [x]  Full WB  []  Partial WB  []  NWB  -Utilizing device:  [x]  None []  Knee Scooter []  Crutches []  Wheelchair    DVT assessment:  [x]  Denies symptoms []  Chest pain/SOB []  Pain in calf/redness/warmth   Redressed DSD and ace wrap. Educated on signs of infection, proper dressing care, pain management, and weight bearing status. Patient will contact provider with any new or worsening symptoms. The provider assessed the patient today and reviewed instructions regarding plan of care.

## 2024-05-12 NOTE — Progress Notes (Signed)
 Subjective:   Patient ID: [name], male   DOB: 69 y.o.   MRN: [id number]   HPI Patient presents after seeing nurse   ROS      Objective:  Physical Exam  Incision sites healing well doing well with removal of stitches wound edges well coapted     Assessment:  Doing well post metatarsal head resection and excision of lesion     Plan:  Gradual return to shoe gear and activity and continue elevation as needed with patient probably having another 6 to 8 weeks for complete recovery.  X-rays indicate satisfactory resection of bone good alignment noted

## 2024-05-18 ENCOUNTER — Encounter: Payer: Self-pay | Admitting: Podiatry

## 2024-05-19 ENCOUNTER — Other Ambulatory Visit: Payer: Self-pay | Admitting: Podiatry

## 2024-05-19 MED ORDER — DOXYCYCLINE HYCLATE 100 MG PO TABS: 100.0000 mg | ORAL_TABLET | Freq: Two times a day (BID) | ORAL | 1 refills | Status: AC

## 2024-06-04 ENCOUNTER — Telehealth: Payer: Self-pay

## 2024-06-04 NOTE — Telephone Encounter (Signed)
 Patient's wife called regarding patient's foot. Patient states that his foot is hurting with every step and there is swelling, redness and hot to the touch. Patient is stating that the pain is worse now then before he had surgery. Please schedule patient an appointment to be evaluated since he has taken antibiotics and still having pain.

## 2024-06-09 ENCOUNTER — Encounter: Admitting: Podiatry

## 2024-08-09 ENCOUNTER — Ambulatory Visit: Admitting: Cardiovascular Disease

## 2025-03-14 ENCOUNTER — Other Ambulatory Visit

## 2025-03-21 ENCOUNTER — Encounter: Admitting: Family Medicine
